# Patient Record
Sex: Female | Born: 1980
Health system: Southern US, Community
[De-identification: ages and names within clinical notes are randomized; demographics above are authoritative.]

## PROBLEM LIST (undated history)

## (undated) DIAGNOSIS — E119 Type 2 diabetes mellitus without complications: Secondary | ICD-10-CM

## (undated) HISTORY — PX: TUBAL LIGATION: SHX77

---

## 2001-11-29 ENCOUNTER — Inpatient Hospital Stay: Admission: AD | Admit: 2001-11-29 | Discharge: 2001-11-29 | Payer: Self-pay | Admitting: *Deleted

## 2001-12-04 ENCOUNTER — Encounter: Admission: RE | Admit: 2001-12-04 | Discharge: 2001-12-04 | Payer: Self-pay | Admitting: *Deleted

## 2001-12-06 ENCOUNTER — Encounter: Admission: RE | Admit: 2001-12-06 | Discharge: 2001-12-24 | Payer: Self-pay | Admitting: *Deleted

## 2001-12-11 ENCOUNTER — Inpatient Hospital Stay (HOSPITAL_COMMUNITY): Admission: AD | Admit: 2001-12-11 | Discharge: 2001-12-11 | Payer: Self-pay | Admitting: *Deleted

## 2001-12-11 ENCOUNTER — Encounter: Admission: RE | Admit: 2001-12-11 | Discharge: 2001-12-11 | Payer: Self-pay | Admitting: *Deleted

## 2001-12-13 ENCOUNTER — Inpatient Hospital Stay (HOSPITAL_COMMUNITY): Admission: AD | Admit: 2001-12-13 | Discharge: 2001-12-13 | Payer: Self-pay | Admitting: *Deleted

## 2001-12-17 ENCOUNTER — Ambulatory Visit (HOSPITAL_COMMUNITY): Admission: RE | Admit: 2001-12-17 | Discharge: 2001-12-17 | Payer: Self-pay | Admitting: *Deleted

## 2001-12-18 ENCOUNTER — Encounter: Admission: RE | Admit: 2001-12-18 | Discharge: 2001-12-18 | Payer: Self-pay | Admitting: *Deleted

## 2001-12-25 ENCOUNTER — Encounter: Admission: RE | Admit: 2001-12-25 | Discharge: 2001-12-25 | Payer: Self-pay | Admitting: *Deleted

## 2002-01-01 ENCOUNTER — Encounter: Admission: RE | Admit: 2002-01-01 | Discharge: 2002-01-01 | Payer: Self-pay | Admitting: *Deleted

## 2002-01-01 ENCOUNTER — Encounter (HOSPITAL_COMMUNITY): Admission: RE | Admit: 2002-01-01 | Discharge: 2002-01-31 | Payer: Self-pay | Admitting: *Deleted

## 2002-01-08 ENCOUNTER — Encounter: Admission: RE | Admit: 2002-01-08 | Discharge: 2002-01-08 | Payer: Self-pay | Admitting: *Deleted

## 2002-01-15 ENCOUNTER — Encounter: Admission: RE | Admit: 2002-01-15 | Discharge: 2002-01-15 | Payer: Self-pay | Admitting: *Deleted

## 2002-01-15 ENCOUNTER — Observation Stay (HOSPITAL_COMMUNITY): Admission: AD | Admit: 2002-01-15 | Discharge: 2002-01-16 | Payer: Self-pay | Admitting: Obstetrics and Gynecology

## 2002-01-22 ENCOUNTER — Encounter: Admission: RE | Admit: 2002-01-22 | Discharge: 2002-01-22 | Payer: Self-pay | Admitting: *Deleted

## 2002-01-24 ENCOUNTER — Encounter: Payer: Self-pay | Admitting: *Deleted

## 2002-01-29 ENCOUNTER — Encounter: Admission: RE | Admit: 2002-01-29 | Discharge: 2002-01-29 | Payer: Self-pay | Admitting: *Deleted

## 2002-02-04 ENCOUNTER — Encounter (HOSPITAL_COMMUNITY): Admission: RE | Admit: 2002-02-04 | Discharge: 2002-02-08 | Payer: Self-pay | Admitting: *Deleted

## 2002-02-07 ENCOUNTER — Inpatient Hospital Stay (HOSPITAL_COMMUNITY): Admission: AD | Admit: 2002-02-07 | Discharge: 2002-02-11 | Payer: Self-pay | Admitting: *Deleted

## 2002-02-07 ENCOUNTER — Encounter (INDEPENDENT_AMBULATORY_CARE_PROVIDER_SITE_OTHER): Payer: Self-pay

## 2003-04-14 ENCOUNTER — Encounter: Admission: RE | Admit: 2003-04-14 | Discharge: 2003-04-14 | Payer: Self-pay | Admitting: Internal Medicine

## 2003-04-29 ENCOUNTER — Encounter: Admission: RE | Admit: 2003-04-29 | Discharge: 2003-04-29 | Payer: Self-pay | Admitting: Internal Medicine

## 2003-05-01 ENCOUNTER — Encounter: Admission: RE | Admit: 2003-05-01 | Discharge: 2003-07-30 | Payer: Self-pay | Admitting: *Deleted

## 2003-05-15 ENCOUNTER — Encounter: Admission: RE | Admit: 2003-05-15 | Discharge: 2003-05-15 | Payer: Self-pay | Admitting: Internal Medicine

## 2003-08-07 ENCOUNTER — Encounter: Admission: RE | Admit: 2003-08-07 | Discharge: 2003-08-07 | Payer: Self-pay | Admitting: Internal Medicine

## 2003-08-21 ENCOUNTER — Encounter: Admission: RE | Admit: 2003-08-21 | Discharge: 2003-08-21 | Payer: Self-pay | Admitting: Internal Medicine

## 2003-08-28 ENCOUNTER — Encounter: Admission: RE | Admit: 2003-08-28 | Discharge: 2003-11-26 | Payer: Self-pay | Admitting: *Deleted

## 2004-08-08 ENCOUNTER — Ambulatory Visit: Payer: Self-pay | Admitting: Internal Medicine

## 2004-09-29 ENCOUNTER — Ambulatory Visit: Payer: Self-pay | Admitting: Internal Medicine

## 2004-10-12 ENCOUNTER — Ambulatory Visit: Payer: Self-pay | Admitting: Internal Medicine

## 2004-11-09 ENCOUNTER — Ambulatory Visit: Payer: Self-pay | Admitting: Internal Medicine

## 2005-12-21 ENCOUNTER — Ambulatory Visit: Payer: Self-pay | Admitting: Internal Medicine

## 2005-12-21 ENCOUNTER — Inpatient Hospital Stay (HOSPITAL_COMMUNITY): Admission: EM | Admit: 2005-12-21 | Discharge: 2005-12-23 | Payer: Self-pay | Admitting: *Deleted

## 2006-01-01 ENCOUNTER — Ambulatory Visit: Payer: Self-pay | Admitting: Internal Medicine

## 2006-01-17 ENCOUNTER — Ambulatory Visit: Payer: Self-pay | Admitting: Internal Medicine

## 2006-08-07 DIAGNOSIS — E1165 Type 2 diabetes mellitus with hyperglycemia: Secondary | ICD-10-CM | POA: Insufficient documentation

## 2006-08-07 DIAGNOSIS — E119 Type 2 diabetes mellitus without complications: Secondary | ICD-10-CM | POA: Insufficient documentation

## 2007-11-26 ENCOUNTER — Encounter: Payer: Self-pay | Admitting: Internal Medicine

## 2007-11-26 ENCOUNTER — Ambulatory Visit: Payer: Self-pay | Admitting: Hospitalist

## 2007-11-26 LAB — CONVERTED CEMR LAB: Hgb A1c MFr Bld: >14 %

## 2007-11-28 LAB — CONVERTED CEMR LAB
ALT: 19 units/L (ref 0–35)
AST: 18 units/L (ref 0–37)
Albumin: 3.3 g/dL — ABNORMAL LOW (ref 3.5–5.2)
Calcium: 9.2 mg/dL (ref 8.4–10.5)
Creatinine, Ser: 0.64 mg/dL (ref 0.40–1.20)
Glucose, Bld: 434 mg/dL — ABNORMAL HIGH (ref 70–99)
Hemoglobin, Urine: NEGATIVE
Microalb Creat Ratio: 828.1 mg/g — ABNORMAL HIGH (ref 0.0–30.0)
Microalb, Ur: 0.53 mg/dL (ref 0.00–1.89)
Potassium: 4.7 meq/L (ref 3.5–5.3)
Total Bilirubin: 0.7 mg/dL (ref 0.3–1.2)
Total CHOL/HDL Ratio: 3.7
Total Protein: 7.2 g/dL (ref 6.0–8.3)
Triglycerides: 194 mg/dL — ABNORMAL HIGH (ref <150)

## 2007-12-10 ENCOUNTER — Ambulatory Visit: Payer: Self-pay | Admitting: Internal Medicine

## 2007-12-10 ENCOUNTER — Encounter: Payer: Self-pay | Admitting: Internal Medicine

## 2007-12-10 DIAGNOSIS — E785 Hyperlipidemia, unspecified: Secondary | ICD-10-CM | POA: Insufficient documentation

## 2007-12-10 DIAGNOSIS — E1169 Type 2 diabetes mellitus with other specified complication: Secondary | ICD-10-CM | POA: Insufficient documentation

## 2007-12-11 LAB — CONVERTED CEMR LAB
Leukocytes, UA: NEGATIVE
Nitrite: POSITIVE — AB
Protein, ur: NEGATIVE mg/dL
RBC / HPF: NONE SEEN (ref <3)
TSH: 0.531 microintl units/mL (ref 0.350–5.50)
Urine Glucose: >1000 mg/dL — AB

## 2007-12-13 ENCOUNTER — Ambulatory Visit: Payer: Self-pay | Admitting: Hospitalist

## 2007-12-20 ENCOUNTER — Ambulatory Visit: Payer: Self-pay | Admitting: Internal Medicine

## 2007-12-20 LAB — CONVERTED CEMR LAB: Blood Glucose, Fingerstick: 196

## 2007-12-25 ENCOUNTER — Encounter: Payer: Self-pay | Admitting: Internal Medicine

## 2008-01-13 ENCOUNTER — Ambulatory Visit: Payer: Self-pay | Admitting: Hospitalist

## 2008-01-13 LAB — CONVERTED CEMR LAB: Blood Glucose, Fingerstick: 221

## 2008-03-10 ENCOUNTER — Ambulatory Visit: Payer: Self-pay | Admitting: Internal Medicine

## 2008-03-10 ENCOUNTER — Encounter: Payer: Self-pay | Admitting: Internal Medicine

## 2008-03-10 LAB — CONVERTED CEMR LAB
Beta hcg, urine, semiquantitative: NEGATIVE
Blood Glucose, Fingerstick: 139
Hgb A1c MFr Bld: 7 %

## 2008-03-12 LAB — CONVERTED CEMR LAB
BUN: 12 mg/dL (ref 6–23)
Calcium: 9.6 mg/dL (ref 8.4–10.5)
Chloride: 104 meq/L (ref 96–112)
Creatinine, Ser: 0.59 mg/dL (ref 0.40–1.20)

## 2008-04-03 ENCOUNTER — Encounter: Payer: Self-pay | Admitting: Internal Medicine

## 2008-04-03 ENCOUNTER — Ambulatory Visit: Payer: Self-pay | Admitting: Infectious Diseases

## 2008-04-03 LAB — CONVERTED CEMR LAB
AST: 11 units/L (ref 0–37)
Albumin: 4 g/dL (ref 3.5–5.2)
Alkaline Phosphatase: 31 units/L — ABNORMAL LOW (ref 39–117)
Potassium: 4.5 meq/L (ref 3.5–5.3)
Sodium: 138 meq/L (ref 135–145)
Total Bilirubin: 0.4 mg/dL (ref 0.3–1.2)
Total Protein: 7 g/dL (ref 6.0–8.3)

## 2008-12-01 ENCOUNTER — Ambulatory Visit: Payer: Self-pay | Admitting: *Deleted

## 2008-12-01 ENCOUNTER — Encounter: Payer: Self-pay | Admitting: Internal Medicine

## 2008-12-01 LAB — CONVERTED CEMR LAB
BUN: 15 mg/dL (ref 6–23)
CO2: 22 meq/L (ref 19–32)
Calcium: 10 mg/dL (ref 8.4–10.5)
Cholesterol: 217 mg/dL — ABNORMAL HIGH (ref 0–200)
Glucose, Bld: 336 mg/dL — ABNORMAL HIGH (ref 70–99)
Total CHOL/HDL Ratio: 3.3
VLDL: 32 mg/dL (ref 0–40)

## 2008-12-17 ENCOUNTER — Telehealth: Payer: Self-pay | Admitting: Internal Medicine

## 2008-12-22 ENCOUNTER — Encounter: Payer: Self-pay | Admitting: Internal Medicine

## 2008-12-24 ENCOUNTER — Ambulatory Visit: Payer: Self-pay | Admitting: Internal Medicine

## 2008-12-24 ENCOUNTER — Encounter: Payer: Self-pay | Admitting: Internal Medicine

## 2008-12-24 LAB — CONVERTED CEMR LAB: Preg, Serum: POSITIVE

## 2008-12-28 ENCOUNTER — Encounter (INDEPENDENT_AMBULATORY_CARE_PROVIDER_SITE_OTHER): Payer: Self-pay | Admitting: Internal Medicine

## 2008-12-28 ENCOUNTER — Ambulatory Visit: Payer: Self-pay | Admitting: Internal Medicine

## 2008-12-28 DIAGNOSIS — O0001 Abdominal pregnancy with intrauterine pregnancy: Secondary | ICD-10-CM

## 2008-12-28 LAB — CONVERTED CEMR LAB
ALT: 15 units/L (ref 0–35)
AST: 15 units/L (ref 0–37)
Basophils Absolute: 0.1 10*3/uL (ref 0.0–0.1)
Basophils Relative: 1 % (ref 0–1)
Blood Glucose, Fingerstick: 258
CO2: 24 meq/L (ref 19–32)
Chloride: 104 meq/L (ref 96–112)
Creatinine, Ser: 0.49 mg/dL (ref 0.40–1.20)
Eosinophils Absolute: 1.3 10*3/uL — ABNORMAL HIGH (ref 0.0–0.7)
Eosinophils Relative: 13 % — ABNORMAL HIGH (ref 0–5)
GFR calc Af Amer: >60 mL/min (ref >60)
Hemoglobin: 13.9 g/dL (ref 12.0–15.0)
MCHC: 32.3 g/dL (ref 30.0–36.0)
MCV: 87.1 fL (ref 78.0–100.0)
Monocytes Absolute: 0.5 10*3/uL (ref 0.1–1.0)
Monocytes Relative: 5 % (ref 3–12)
RBC: 4.93 M/uL (ref 3.87–5.11)
RDW: 12.2 % (ref 11.5–15.5)
Sodium: 135 meq/L (ref 135–145)
Total Bilirubin: 0.7 mg/dL (ref 0.3–1.2)
Total Protein: 6.5 g/dL (ref 6.0–8.3)

## 2009-01-06 ENCOUNTER — Encounter: Payer: Self-pay | Admitting: Physician Assistant

## 2009-01-06 ENCOUNTER — Ambulatory Visit: Payer: Self-pay | Admitting: Obstetrics & Gynecology

## 2009-01-06 LAB — CONVERTED CEMR LAB
HCT: 40.3 % (ref 36.0–46.0)
Hemoglobin: 13.3 g/dL (ref 12.0–15.0)
Hepatitis B Surface Ag: NEGATIVE
Hgb A2 Quant: 2.8 % (ref 2.2–3.2)
Hgb F Quant: 0 % (ref 0.0–2.0)
Hgb S Quant: 0 % (ref 0.0–0.0)
Lymphs Abs: 2.5 10*3/uL (ref 0.7–4.0)
MCV: 85.9 fL (ref 78.0–100.0)
Monocytes Relative: 5 % (ref 3–12)
Neutro Abs: 6.5 10*3/uL (ref 1.7–7.7)
Neutrophils Relative %: 63 % (ref 43–77)
Rubella: 2 intl units/mL
hCG, Beta Chain, Quant, S: 78458.9 milliintl units/mL

## 2009-01-11 ENCOUNTER — Encounter: Admission: RE | Admit: 2009-01-11 | Discharge: 2009-04-11 | Payer: Self-pay | Admitting: Obstetrics & Gynecology

## 2009-01-11 ENCOUNTER — Ambulatory Visit: Payer: Self-pay | Admitting: Obstetrics & Gynecology

## 2009-01-13 ENCOUNTER — Ambulatory Visit: Payer: Self-pay | Admitting: Internal Medicine

## 2009-01-13 ENCOUNTER — Encounter: Payer: Self-pay | Admitting: Internal Medicine

## 2009-01-18 ENCOUNTER — Ambulatory Visit: Payer: Self-pay | Admitting: Family Medicine

## 2009-01-25 ENCOUNTER — Ambulatory Visit: Payer: Self-pay | Admitting: Obstetrics & Gynecology

## 2009-01-27 ENCOUNTER — Ambulatory Visit: Payer: Self-pay | Admitting: Family Medicine

## 2009-01-27 LAB — CONVERTED CEMR LAB
AST: 12 units/L (ref 0–37)
Albumin: 3.7 g/dL (ref 3.5–5.2)
Alkaline Phosphatase: 56 units/L (ref 39–117)
BUN: 9 mg/dL (ref 6–23)
Creatinine 24 HR UR: 1065 mg/24hr (ref 700–1800)
Creatinine Clearance: 142 mL/min — ABNORMAL HIGH (ref 75–115)
Creatinine, Urine: 41.8 mg/dL
Potassium: 4.2 meq/L (ref 3.5–5.3)
Protein, Ur: 51 mg/24hr (ref 50–100)
Total Bilirubin: 0.3 mg/dL (ref 0.3–1.2)

## 2009-02-01 ENCOUNTER — Ambulatory Visit: Payer: Self-pay | Admitting: Obstetrics & Gynecology

## 2009-02-03 ENCOUNTER — Ambulatory Visit (HOSPITAL_COMMUNITY): Admission: RE | Admit: 2009-02-03 | Discharge: 2009-02-03 | Payer: Self-pay | Admitting: Obstetrics & Gynecology

## 2009-02-08 ENCOUNTER — Ambulatory Visit: Payer: Self-pay | Admitting: Obstetrics & Gynecology

## 2009-02-22 ENCOUNTER — Ambulatory Visit: Payer: Self-pay | Admitting: Obstetrics & Gynecology

## 2009-03-01 ENCOUNTER — Ambulatory Visit: Payer: Self-pay | Admitting: Obstetrics & Gynecology

## 2009-03-08 ENCOUNTER — Ambulatory Visit: Payer: Self-pay | Admitting: Obstetrics & Gynecology

## 2009-03-15 ENCOUNTER — Ambulatory Visit (HOSPITAL_COMMUNITY): Admission: RE | Admit: 2009-03-15 | Discharge: 2009-03-15 | Payer: Self-pay | Admitting: Obstetrics & Gynecology

## 2009-03-15 ENCOUNTER — Ambulatory Visit: Payer: Self-pay | Admitting: Obstetrics & Gynecology

## 2009-03-18 ENCOUNTER — Emergency Department (HOSPITAL_COMMUNITY): Admission: EM | Admit: 2009-03-18 | Discharge: 2009-03-18 | Payer: Self-pay | Admitting: Emergency Medicine

## 2009-03-22 ENCOUNTER — Ambulatory Visit: Payer: Self-pay | Admitting: Obstetrics & Gynecology

## 2009-03-22 ENCOUNTER — Inpatient Hospital Stay (HOSPITAL_COMMUNITY): Admission: AD | Admit: 2009-03-22 | Discharge: 2009-03-22 | Payer: Self-pay | Admitting: Obstetrics & Gynecology

## 2009-03-29 ENCOUNTER — Ambulatory Visit: Payer: Self-pay | Admitting: Obstetrics & Gynecology

## 2009-04-05 ENCOUNTER — Encounter: Payer: Self-pay | Admitting: Physician Assistant

## 2009-04-05 ENCOUNTER — Ambulatory Visit: Payer: Self-pay | Admitting: Obstetrics & Gynecology

## 2009-04-05 LAB — CONVERTED CEMR LAB
MCV: 87.1 fL (ref 78.0–100.0)
Platelets: 339 10*3/uL (ref 150–400)
RBC: 4.17 M/uL (ref 3.87–5.11)
WBC: 13.1 10*3/uL — ABNORMAL HIGH (ref 4.0–10.5)

## 2009-04-12 ENCOUNTER — Encounter: Admission: RE | Admit: 2009-04-12 | Discharge: 2009-07-11 | Payer: Self-pay | Admitting: Obstetrics & Gynecology

## 2009-04-12 ENCOUNTER — Ambulatory Visit: Payer: Self-pay | Admitting: Obstetrics & Gynecology

## 2009-04-26 ENCOUNTER — Ambulatory Visit: Payer: Self-pay | Admitting: Obstetrics & Gynecology

## 2009-04-26 ENCOUNTER — Encounter: Payer: Self-pay | Admitting: Physician Assistant

## 2009-05-03 ENCOUNTER — Ambulatory Visit: Payer: Self-pay | Admitting: Family Medicine

## 2009-05-10 ENCOUNTER — Ambulatory Visit: Payer: Self-pay | Admitting: Obstetrics & Gynecology

## 2009-05-10 ENCOUNTER — Ambulatory Visit (HOSPITAL_COMMUNITY): Admission: RE | Admit: 2009-05-10 | Discharge: 2009-05-10 | Payer: Self-pay | Admitting: Obstetrics & Gynecology

## 2009-05-31 ENCOUNTER — Ambulatory Visit: Payer: Self-pay | Admitting: Obstetrics & Gynecology

## 2009-05-31 ENCOUNTER — Encounter: Payer: Self-pay | Admitting: Physician Assistant

## 2009-05-31 LAB — CONVERTED CEMR LAB
HCT: 33.5 % — ABNORMAL LOW (ref 36.0–46.0)
Hemoglobin: 10.7 g/dL — ABNORMAL LOW (ref 12.0–15.0)
MCV: 87.9 fL (ref 78.0–100.0)
RBC: 3.81 M/uL — ABNORMAL LOW (ref 3.87–5.11)
WBC: 10.8 10*3/uL — ABNORMAL HIGH (ref 4.0–10.5)

## 2009-06-07 ENCOUNTER — Encounter: Payer: Self-pay | Admitting: Physician Assistant

## 2009-06-07 ENCOUNTER — Ambulatory Visit: Payer: Self-pay | Admitting: Obstetrics & Gynecology

## 2009-06-07 LAB — CONVERTED CEMR LAB: Trich, Wet Prep: NONE SEEN

## 2009-06-14 ENCOUNTER — Ambulatory Visit (HOSPITAL_COMMUNITY): Admission: RE | Admit: 2009-06-14 | Discharge: 2009-06-14 | Payer: Self-pay | Admitting: Obstetrics & Gynecology

## 2009-06-14 ENCOUNTER — Ambulatory Visit: Payer: Self-pay | Admitting: Obstetrics & Gynecology

## 2009-06-14 ENCOUNTER — Encounter: Payer: Self-pay | Admitting: Family

## 2009-06-21 ENCOUNTER — Ambulatory Visit: Payer: Self-pay | Admitting: Obstetrics & Gynecology

## 2009-06-21 ENCOUNTER — Ambulatory Visit (HOSPITAL_COMMUNITY): Admission: RE | Admit: 2009-06-21 | Discharge: 2009-06-21 | Payer: Self-pay | Admitting: Family Medicine

## 2009-06-28 ENCOUNTER — Ambulatory Visit: Payer: Self-pay | Admitting: Obstetrics & Gynecology

## 2009-07-05 ENCOUNTER — Ambulatory Visit: Payer: Self-pay | Admitting: Obstetrics & Gynecology

## 2009-07-06 ENCOUNTER — Encounter: Payer: Self-pay | Admitting: Advanced Practice Midwife

## 2009-07-08 ENCOUNTER — Encounter: Payer: Self-pay | Admitting: Family Medicine

## 2009-07-08 ENCOUNTER — Ambulatory Visit: Payer: Self-pay | Admitting: Obstetrics & Gynecology

## 2009-07-08 ENCOUNTER — Ambulatory Visit (HOSPITAL_COMMUNITY): Admission: RE | Admit: 2009-07-08 | Discharge: 2009-07-08 | Payer: Self-pay | Admitting: Family Medicine

## 2009-07-12 ENCOUNTER — Ambulatory Visit: Payer: Self-pay | Admitting: Obstetrics & Gynecology

## 2009-07-12 ENCOUNTER — Encounter: Admission: RE | Admit: 2009-07-12 | Discharge: 2009-07-12 | Payer: Self-pay | Admitting: Obstetrics & Gynecology

## 2009-07-19 ENCOUNTER — Ambulatory Visit: Payer: Self-pay | Admitting: Family Medicine

## 2009-07-19 ENCOUNTER — Encounter: Payer: Self-pay | Admitting: Obstetrics and Gynecology

## 2009-07-19 LAB — CONVERTED CEMR LAB
Chlamydia, DNA Probe: NEGATIVE
GC Probe Amp, Genital: NEGATIVE

## 2009-07-20 ENCOUNTER — Encounter: Payer: Self-pay | Admitting: Obstetrics and Gynecology

## 2009-07-22 ENCOUNTER — Ambulatory Visit: Payer: Self-pay | Admitting: Obstetrics & Gynecology

## 2009-07-26 ENCOUNTER — Ambulatory Visit: Payer: Self-pay | Admitting: Obstetrics & Gynecology

## 2009-07-29 ENCOUNTER — Ambulatory Visit: Payer: Self-pay | Admitting: Obstetrics & Gynecology

## 2009-08-02 ENCOUNTER — Encounter: Payer: Self-pay | Admitting: Family

## 2009-08-02 ENCOUNTER — Ambulatory Visit: Payer: Self-pay | Admitting: Obstetrics & Gynecology

## 2009-08-05 ENCOUNTER — Ambulatory Visit: Payer: Self-pay | Admitting: Obstetrics & Gynecology

## 2009-08-09 ENCOUNTER — Inpatient Hospital Stay (HOSPITAL_COMMUNITY): Admission: RE | Admit: 2009-08-09 | Discharge: 2009-08-12 | Payer: Self-pay | Admitting: Obstetrics & Gynecology

## 2009-08-09 ENCOUNTER — Ambulatory Visit: Payer: Self-pay | Admitting: Obstetrics & Gynecology

## 2010-10-09 ENCOUNTER — Encounter: Payer: Self-pay | Admitting: *Deleted

## 2010-12-21 LAB — POCT URINALYSIS DIP (DEVICE)
Bilirubin Urine: NEGATIVE
Glucose, UA: NEGATIVE mg/dL
Hgb urine dipstick: NEGATIVE
Hgb urine dipstick: NEGATIVE
Hgb urine dipstick: NEGATIVE
Ketones, ur: NEGATIVE mg/dL
Nitrite: NEGATIVE
Protein, ur: NEGATIVE mg/dL
Protein, ur: NEGATIVE mg/dL
Specific Gravity, Urine: 1.01 (ref 1.005–1.030)
Specific Gravity, Urine: 1.01 (ref 1.005–1.030)
Specific Gravity, Urine: 1.01 (ref 1.005–1.030)
Urobilinogen, UA: 0.2 mg/dL (ref 0.0–1.0)
Urobilinogen, UA: 0.2 mg/dL (ref 0.0–1.0)
pH: 6 (ref 5.0–8.0)
pH: 6.5 (ref 5.0–8.0)
pH: 6.5 (ref 5.0–8.0)

## 2010-12-21 LAB — GLUCOSE, CAPILLARY
Glucose-Capillary: 126 mg/dL — ABNORMAL HIGH (ref 70–99)
Glucose-Capillary: 133 mg/dL — ABNORMAL HIGH (ref 70–99)
Glucose-Capillary: 134 mg/dL — ABNORMAL HIGH (ref 70–99)
Glucose-Capillary: 156 mg/dL — ABNORMAL HIGH (ref 70–99)
Glucose-Capillary: 166 mg/dL — ABNORMAL HIGH (ref 70–99)
Glucose-Capillary: 92 mg/dL (ref 70–99)
Glucose-Capillary: 97 mg/dL (ref 70–99)

## 2010-12-21 LAB — BASIC METABOLIC PANEL
CO2: 23 mEq/L (ref 19–32)
Calcium: 8.7 mg/dL (ref 8.4–10.5)
GFR calc Af Amer: >60 mL/min (ref >60)
Sodium: 135 mEq/L (ref 135–145)

## 2010-12-21 LAB — CBC
Hemoglobin: 12.6 g/dL (ref 12.0–15.0)
MCHC: 33.2 g/dL (ref 30.0–36.0)
MCV: 91 fL (ref 78.0–100.0)
RBC: 3.1 MIL/uL — ABNORMAL LOW (ref 3.87–5.11)
RBC: 4.25 MIL/uL (ref 3.87–5.11)
WBC: 11.2 10*3/uL — ABNORMAL HIGH (ref 4.0–10.5)
WBC: 7.9 10*3/uL (ref 4.0–10.5)

## 2010-12-21 LAB — RPR: RPR Ser Ql: NONREACTIVE

## 2010-12-21 LAB — CROSSMATCH: ABO/RH(D): O POS

## 2010-12-22 LAB — POCT URINALYSIS DIP (DEVICE)
Bilirubin Urine: NEGATIVE
Glucose, UA: NEGATIVE mg/dL
Hgb urine dipstick: NEGATIVE
Hgb urine dipstick: NEGATIVE
Hgb urine dipstick: NEGATIVE
Ketones, ur: NEGATIVE mg/dL
Ketones, ur: NEGATIVE mg/dL
Nitrite: NEGATIVE
Nitrite: NEGATIVE
Nitrite: NEGATIVE
Nitrite: NEGATIVE
Protein, ur: NEGATIVE mg/dL
Protein, ur: NEGATIVE mg/dL
Protein, ur: NEGATIVE mg/dL
Protein, ur: NEGATIVE mg/dL
Protein, ur: NEGATIVE mg/dL
Specific Gravity, Urine: 1.015 (ref 1.005–1.030)
Specific Gravity, Urine: 1.015 (ref 1.005–1.030)
Urobilinogen, UA: 0.2 mg/dL (ref 0.0–1.0)
Urobilinogen, UA: 1 mg/dL (ref 0.0–1.0)
Urobilinogen, UA: 0.2 mg/dL (ref 0.0–1.0)
Urobilinogen, UA: 0.2 mg/dL (ref 0.0–1.0)
pH: 6.5 (ref 5.0–8.0)
pH: 6.5 (ref 5.0–8.0)
pH: 6 (ref 5.0–8.0)
pH: 6 (ref 5.0–8.0)

## 2010-12-23 LAB — POCT URINALYSIS DIP (DEVICE)
Bilirubin Urine: NEGATIVE
Glucose, UA: 100 mg/dL — AB
Glucose, UA: NEGATIVE mg/dL
Hgb urine dipstick: NEGATIVE
Nitrite: NEGATIVE
Nitrite: POSITIVE — AB
Nitrite: NEGATIVE
Protein, ur: 30 mg/dL — AB
Specific Gravity, Urine: >=1.03 (ref 1.005–1.030)
Urobilinogen, UA: 0.2 mg/dL (ref 0.0–1.0)
Urobilinogen, UA: 1 mg/dL (ref 0.0–1.0)
pH: 6.5 (ref 5.0–8.0)

## 2010-12-24 LAB — POCT URINALYSIS DIP (DEVICE)
Bilirubin Urine: NEGATIVE
Glucose, UA: NEGATIVE mg/dL
Glucose, UA: NEGATIVE mg/dL
Glucose, UA: NEGATIVE mg/dL
Ketones, ur: NEGATIVE mg/dL
Nitrite: NEGATIVE
Nitrite: NEGATIVE
Protein, ur: NEGATIVE mg/dL
Urobilinogen, UA: 0.2 mg/dL (ref 0.0–1.0)
Urobilinogen, UA: 0.2 mg/dL (ref 0.0–1.0)

## 2010-12-25 LAB — URINALYSIS, ROUTINE W REFLEX MICROSCOPIC
Nitrite: NEGATIVE
Specific Gravity, Urine: 1.016 (ref 1.005–1.030)
pH: 6 (ref 5.0–8.0)

## 2010-12-25 LAB — POCT URINALYSIS DIP (DEVICE)
Bilirubin Urine: NEGATIVE
Bilirubin Urine: NEGATIVE
Glucose, UA: 100 mg/dL — AB
Glucose, UA: NEGATIVE mg/dL
Glucose, UA: NEGATIVE mg/dL
Glucose, UA: NEGATIVE mg/dL
Hgb urine dipstick: NEGATIVE
Ketones, ur: NEGATIVE mg/dL
Ketones, ur: NEGATIVE mg/dL
Nitrite: NEGATIVE
Specific Gravity, Urine: 1.025 (ref 1.005–1.030)
Specific Gravity, Urine: 1.025 (ref 1.005–1.030)
Urobilinogen, UA: 0.2 mg/dL (ref 0.0–1.0)
pH: 5.5 (ref 5.0–8.0)

## 2010-12-25 LAB — GLUCOSE, CAPILLARY
Glucose-Capillary: 164 mg/dL — ABNORMAL HIGH (ref 70–99)
Glucose-Capillary: 203 mg/dL — ABNORMAL HIGH (ref 70–99)

## 2010-12-25 LAB — BASIC METABOLIC PANEL
GFR calc non Af Amer: >60 mL/min (ref >60)
Glucose, Bld: 87 mg/dL (ref 70–99)
Potassium: 3.7 mEq/L (ref 3.5–5.1)
Sodium: 137 mEq/L (ref 135–145)

## 2010-12-25 LAB — URINE CULTURE

## 2010-12-26 LAB — POCT URINALYSIS DIP (DEVICE)
Glucose, UA: NEGATIVE mg/dL
Hgb urine dipstick: NEGATIVE
Ketones, ur: NEGATIVE mg/dL
Nitrite: NEGATIVE
Nitrite: NEGATIVE
Protein, ur: 100 mg/dL — AB
Protein, ur: 30 mg/dL — AB
Protein, ur: 30 mg/dL — AB
Specific Gravity, Urine: 1.02 (ref 1.005–1.030)
Urobilinogen, UA: 0.2 mg/dL (ref 0.0–1.0)
Urobilinogen, UA: 0.2 mg/dL (ref 0.0–1.0)
pH: 6 (ref 5.0–8.0)

## 2010-12-27 LAB — POCT URINALYSIS DIP (DEVICE)
Bilirubin Urine: NEGATIVE
Bilirubin Urine: NEGATIVE
Glucose, UA: 500 mg/dL — AB
Glucose, UA: 500 mg/dL — AB
Ketones, ur: NEGATIVE mg/dL
Nitrite: NEGATIVE
Nitrite: NEGATIVE
Nitrite: NEGATIVE
Nitrite: NEGATIVE
Protein, ur: 30 mg/dL — AB
Protein, ur: NEGATIVE mg/dL
Protein, ur: 30 mg/dL — AB
Urobilinogen, UA: 0.2 mg/dL (ref 0.0–1.0)
Urobilinogen, UA: 0.2 mg/dL (ref 0.0–1.0)
Urobilinogen, UA: 0.2 mg/dL (ref 0.0–1.0)
Urobilinogen, UA: 0.2 mg/dL (ref 0.0–1.0)
pH: 6 (ref 5.0–8.0)

## 2010-12-28 LAB — POCT URINALYSIS DIP (DEVICE)
Bilirubin Urine: NEGATIVE
Bilirubin Urine: NEGATIVE
Ketones, ur: NEGATIVE mg/dL
Ketones, ur: NEGATIVE mg/dL
Nitrite: NEGATIVE
Nitrite: NEGATIVE
Protein, ur: NEGATIVE mg/dL

## 2010-12-28 LAB — GLUCOSE, CAPILLARY
Glucose-Capillary: 181 mg/dL — ABNORMAL HIGH (ref 70–99)
Glucose-Capillary: 186 mg/dL — ABNORMAL HIGH (ref 70–99)

## 2010-12-29 LAB — GLUCOSE, CAPILLARY

## 2011-02-03 NOTE — Op Note (Signed)
[facility]  Patient:    Shelby Brown, Shelby Brown Visit Number: [id number] MRN: [id number]          Service Type: ANT Location: [facility] Attending Physician:  Michaelle Copas Dictated by:   Conni Elliot, M.D. Proc. Date: 02/08/02 Admit Date:  02/04/2002 Discharge Date: 02/08/2002                             Operative Report  PREOPERATIVE DIAGNOSIS:       Intrauterine pregnancy at 36-5/7 weeks with insulin-dependent gestational diabetes and fetal tachycardia and repetitive moderate-to-severe variables.  POSTOPERATIVE DIAGNOSIS:      Intrauterine pregnancy at 36-5/7 weeks with insulin-dependent gestational diabetes and fetal tachycardia and repetitive moderate-to-severe variables and possible placental abruption.  OPERATION:                    Low-transverse cesarean -- emergent.  OPERATORS:                    Conni Elliot, M.D.                               Marlinda Mike, CNM  ANESTHESIA:                   Spinal.  OPERATIVE FINDINGS:           Female infant with Apgars of 8/9, cord pH of 7.19, with a PCO2 of 87, bicarb of 32, base excess of 0.  DESCRIPTION OF PROCEDURE:     After bringing the patient emergently to the operating room, receiving oxygen, the patient received a spinal anesthetic. The abdomen was prepped and draped in a sterile fashion.  A low transverse Pfannenstiel incision was made.  Incision was made through the skin and fascia.  Rectus muscle was separated in the midline, peritoneal cavity entered and bladder flap created.  Low transverse uterine incision was made.  Baby was in a vertex presentation, cord doubly clamped, and handed to the neonatologist in attendance.  Immediately upon delivery of the baby, there was an immediate significant amount of gush of bright red blood from the uterine cavity consistent with placental abruption.  Placental delivery followed immediately without any difficulty.  The uterus,  bladder flap, anterior peritoneum, fascia and skin were closed in routine fashion.  ESTIMATED BLOOD LOSS:         Less than 800 cc.  COUNTS:                       Needle and sponge count correct. Dictated by:   Conni Elliot, M.D. Attending Physician:  Michaelle Copas DD:  02/08/02 TD:  02/11/02 Job: [phone] [id number]

## 2011-02-03 NOTE — Discharge Summary (Signed)
[facility]  Patient:    Shelby Brown, Shelby Brown Visit Number: [id number] MRN: [id number]          Service Type: OBS Location: 910A [id number] [id number] Attending Physician:  Michaelle Copas Dictated by:   Vear Clock, M.D. Admit Date:  02/07/2002 Discharge Date: 02/11/2002                             Discharge Summary  DISCHARGE DIAGNOSES:          1. Status post low transverse cesarean section.                               2. Delivery of a viable female infant.                               3. Indications for cesarean section persistent                                  fetal tachycardia and repetitive moderate                                  variable decelerations.  DISCHARGE MEDICATIONS:        1. Ibuprofen 600 mg p.o. q.6-8h. p.r.n. pain.                               2. Percocet 5/325 one tablet p.o. q.4-6h. p.r.n.                                  severe pain #20 no refills.                               3. Depo-Provera one injection q.3 months.                               4. Iron sulfate 325 mg one tablet p.o. t.i.d.                                  with meals x6 weeks.                               5. Prenatal vitamins one tablet p.o. q.d. x6                                  weeks or while breast-feeding.  FOLLOWUP:                     The patient is to return to [facility] in six weeks for follow-up.  PROCEDURES:                   Primary low transverse cesarean section on May 24 with delivery of infant at 1809, Apgars 8  and 9.  HISTORY AND PHYSICAL:         A 30 year old G2, P1 at 68 and 5 admitted following a fetal testing appointment secondary to GDM.  Found to have an initial nonreassuring fetal heart rate.  Pregnancy complications:  Poor glycemic control.  PRENATAL LABORATORIES:         O+.  Antibody negative.  Rubella nonimmune. Hepatitis B surface antigen negative.  RPR negative.  GC negative.  Chlamydia negative.   GBS negative.  HOSPITAL COURSE:              The patient was admitted for IV fluid bolus and observation.  The patient did have initially few brief mild variables and isolated late decelerations.  The patient developed increased contractions and mild to moderate variables which did not resolve with O2 so patient was taken to the OR.  Routine postpartum/postoperative course.  The patient is breast-feeding.  Staples removed prior to discharge. Dictated by:   Vear Clock, M.D. Attending Physician:  Michaelle Copas DD:  04/06/02 TD:  04/11/02 Job: [id number]

## 2013-03-09 ENCOUNTER — Emergency Department (HOSPITAL_COMMUNITY)
Admission: EM | Admit: 2013-03-09 | Discharge: 2013-03-09 | Disposition: A | Discharge status: Home / Self Care | Payer: Self-pay | Attending: Emergency Medicine | Admitting: Emergency Medicine

## 2013-03-09 DIAGNOSIS — E1169 Type 2 diabetes mellitus with other specified complication: Secondary | ICD-10-CM | POA: Insufficient documentation

## 2013-03-09 DIAGNOSIS — R450 Nervousness: Secondary | ICD-10-CM | POA: Insufficient documentation

## 2013-03-09 DIAGNOSIS — F419 Anxiety disorder, unspecified: Secondary | ICD-10-CM

## 2013-03-09 DIAGNOSIS — F10220 Alcohol dependence with intoxication, uncomplicated: Secondary | ICD-10-CM

## 2013-03-09 DIAGNOSIS — R739 Hyperglycemia, unspecified: Secondary | ICD-10-CM

## 2013-03-09 DIAGNOSIS — Z3202 Encounter for pregnancy test, result negative: Secondary | ICD-10-CM | POA: Insufficient documentation

## 2013-03-09 DIAGNOSIS — F411 Generalized anxiety disorder: Secondary | ICD-10-CM | POA: Insufficient documentation

## 2013-03-09 DIAGNOSIS — F1010 Alcohol abuse, uncomplicated: Secondary | ICD-10-CM | POA: Insufficient documentation

## 2013-03-09 DIAGNOSIS — Z794 Long term (current) use of insulin: Secondary | ICD-10-CM | POA: Insufficient documentation

## 2013-03-09 LAB — RAPID URINE DRUG SCREEN, HOSP PERFORMED
Barbiturates: NOT DETECTED
Benzodiazepines: NOT DETECTED
Cocaine: NOT DETECTED

## 2013-03-09 LAB — COMPREHENSIVE METABOLIC PANEL
ALT: 11 U/L (ref 0–35)
BUN: 12 mg/dL (ref 6–23)
CO2: 18 mEq/L — ABNORMAL LOW (ref 19–32)
Calcium: 9.5 mg/dL (ref 8.4–10.5)
Creatinine, Ser: 0.59 mg/dL (ref 0.50–1.10)
GFR calc Af Amer: >90 mL/min (ref >90)
GFR calc non Af Amer: >90 mL/min (ref >90)
Glucose, Bld: 302 mg/dL — ABNORMAL HIGH (ref 70–99)
Sodium: 138 mEq/L (ref 135–145)
Total Protein: 7.8 g/dL (ref 6.0–8.3)

## 2013-03-09 LAB — CBC WITH DIFFERENTIAL/PLATELET
Eosinophils Absolute: 0.2 10*3/uL (ref 0.0–0.7)
Eosinophils Relative: 1 % (ref 0–5)
HCT: 38.4 % (ref 36.0–46.0)
Hemoglobin: 13.1 g/dL (ref 12.0–15.0)
Lymphocytes Relative: 19 % (ref 12–46)
Lymphs Abs: 2.6 10*3/uL (ref 0.7–4.0)
MCH: 28.4 pg (ref 26.0–34.0)
MCV: 83.3 fL (ref 78.0–100.0)
Monocytes Absolute: 0.7 10*3/uL (ref 0.1–1.0)
Monocytes Relative: 5 % (ref 3–12)
Platelets: 367 10*3/uL (ref 150–400)
RBC: 4.61 MIL/uL (ref 3.87–5.11)
WBC: 13.4 10*3/uL — ABNORMAL HIGH (ref 4.0–10.5)

## 2013-03-09 LAB — URINALYSIS, ROUTINE W REFLEX MICROSCOPIC
Bilirubin Urine: NEGATIVE
Glucose, UA: >1000 mg/dL — AB
Hgb urine dipstick: NEGATIVE
Specific Gravity, Urine: 1.016 (ref 1.005–1.030)
Urobilinogen, UA: 0.2 mg/dL (ref 0.0–1.0)

## 2013-03-09 LAB — URINE MICROSCOPIC-ADD ON

## 2013-03-09 LAB — ETHANOL: Alcohol, Ethyl (B): 159 mg/dL — ABNORMAL HIGH (ref 0–11)

## 2013-03-09 LAB — GLUCOSE, CAPILLARY: Glucose-Capillary: 218 mg/dL — ABNORMAL HIGH (ref 70–99)

## 2013-03-09 MED ORDER — ONDANSETRON 8 MG PO TBDP
8.0000 mg | ORAL_TABLET | Freq: Once | ORAL | Status: AC
Start: 2013-03-09 — End: 2013-03-09
  Administered 2013-03-09: 8 mg via ORAL
  Filled 2013-03-09: qty 1

## 2013-03-09 NOTE — ED Notes (Signed)
Pt in Telepsych with at this time with NT Fleet Contras

## 2013-03-09 NOTE — ED Provider Notes (Signed)
Medical screening examination/treatment/procedure(s) were performed by non-physician practitioner and as supervising physician I was immediately available for consultation/collaboration.  Toy Baker, MD 03/09/13 [phone]

## 2013-03-09 NOTE — ED Notes (Signed)
Pt arrived via EMS with a chief complaint of an anxiety attack.  The pt husband told EMS that she had made suicidal ideation.  Pt states that she doesn't wish to die but she has thought of it from time to time.  Her cousin recently died and left her husband with a large debt load and she does not wish to do the same to her husband and children.  Pt states her husband does make a lot of money and that the air condition has been turned off and this makes her anxious.

## 2013-03-09 NOTE — ED Notes (Signed)
Telepsych consult MD called and reported to this writer that pt is psych cleared to leave facility. Trandsferred telepsych MD to Dr. Silverio Lay to make his recommendation

## 2013-03-09 NOTE — ED Notes (Signed)
Pt's husband has arrived.  Upon discussion with the husband it was stated that one of the children called EMS.  The husband stated that the problem with his wife is that the air codition is off in the apartment and this caused her to have breathing difficulties which was interpreted as a panic attack. Pt. Husband states that because she is diabetic that this is the cause of her breathing situation.  Pt's husband denies any statements by the wife concerning suicidal intentions.

## 2013-03-09 NOTE — ED Provider Notes (Signed)
Medical screening examination/treatment/procedure(s) were performed by non-physician practitioner and as supervising physician I was immediately available for consultation/collaboration.    Nelia Shi, MD 03/09/13 [phone]

## 2013-03-09 NOTE — ED Provider Notes (Signed)
History     CSN: [id number]  Arrival date & time 03/09/13  0409   First MD Initiated Contact with Patient 03/09/13 0434      Chief Complaint  Patient presents with  . Medical Clearance  . Alcohol Intoxication    (Consider location/radiation/quality/duration/timing/severity/associated sxs/prior treatment) HPI Comments: Patient is here following an anxiety attack.  She states her husband lost his job.  He doesn't make enough money when he works they have is 3 children.  She also reports that her cousin recently died and left her husband with a very large debt  And she doesn't want to do this to her husband and children    she has a history of anxiety attacks.  She states she is seeing a counselor after her last anxiety attack that she was referred for anxiety: She is an insulin-dependent diabetic, whose blood sugar normally is, between 200and 300 now.  Her blood sugar is 270.  She states she has been taking her medication as instructed.  She denies suicidality, although her husband is concerned, that she expressed this on several occasions.  Patient is a 32 y.o. female presenting with intoxication. The history is provided by the patient. A language interpreter was used.  Alcohol Intoxication This is a recurrent problem. The current episode started 1 to 4 weeks ago. Pertinent negatives include no chills, fever, nausea, rash or vomiting.    No past medical history on file.  No past surgical history on file.  No family history on file.  History  Substance Use Topics  . Smoking status: Not on file  . Smokeless tobacco: Not on file  . Alcohol Use: Not on file    OB History   No data available      Review of Systems  Unable to perform ROS Constitutional: Negative for fever and chills.  Gastrointestinal: Negative for nausea and vomiting.  Skin: Negative for rash.  Psychiatric/Behavioral: The patient is nervous/anxious.   All other systems reviewed and are  negative.    Allergies  Review of patient's allergies indicates no known allergies.  Home Medications  No current outpatient prescriptions on file.  BP 101/67  Pulse 91  Temp(Src) 98.6 F (37 C) (Oral)  Resp 16  SpO2 96%  Physical Exam  Vitals reviewed. Constitutional: She is oriented to person, place, and time. She appears well-developed and well-nourished.  Eyes: Pupils are equal, round, and reactive to light.  Neck: Normal range of motion.  Cardiovascular: Normal rate.   Pulmonary/Chest: Effort normal.  Abdominal: Soft.  Musculoskeletal: Normal range of motion.  Neurological: She is alert and oriented to person, place, and time.  Skin: Skin is warm and dry.  Psychiatric: Her mood appears anxious.    ED Course  Procedures (including critical care time)  Labs Reviewed  URINALYSIS, ROUTINE W REFLEX MICROSCOPIC - Abnormal; Notable for the following:    Glucose, UA >1000 (*)    All other components within normal limits  CBC WITH DIFFERENTIAL - Abnormal; Notable for the following:    WBC 13.4 (*)    Neutro Abs 9.9 (*)    All other components within normal limits  COMPREHENSIVE METABOLIC PANEL - Abnormal; Notable for the following:    CO2 18 (*)    Glucose, Bld 302 (*)    All other components within normal limits  ETHANOL - Abnormal; Notable for the following:    Alcohol, Ethyl (B) 159 (*)    All other components within normal limits  GLUCOSE, CAPILLARY -  Abnormal; Notable for the following:    Glucose-Capillary 218 (*)    All other components within normal limits  URINE RAPID DRUG SCREEN (HOSP PERFORMED)  URINE MICROSCOPIC-ADD ON  POCT PREGNANCY, URINE   No results found.   1. Anxiety   2. Alcohol intoxication in episodic drinker, uncomplicated   3. Hyperglycemia       MDM  Spoken with ACT.  Will evaluate the patient         Arman Filter, NP 03/09/13 2046

## 2013-03-09 NOTE — ED Provider Notes (Signed)
6:30 AM Sig care of the patient from nurse practitioner Tomasa Blase. Patient presents the ED with alcohol intoxication, hypoglycemia and panic attack to 2 life stressors.  Currently awaiting act team consult.  Will repeat CBG. No anion gap or DKA  Somnolent but easily arouses no complaints CV: RRR, No M/R/G, Peripheral pulses intact. No peripheral edema. Lungs: CTAB Abd: Soft, Non tender, non distended   Patient cleared for discharge by psych.Patient is complaining of headache and nausea, likely due to hangover.patient given 8mg  zofran odt. Appears safe for discharge.  Arthor Captain, PA-C 03/09/13 1026

## 2014-01-06 ENCOUNTER — Encounter: Payer: Self-pay | Admitting: Internal Medicine

## 2014-01-06 ENCOUNTER — Ambulatory Visit (INDEPENDENT_AMBULATORY_CARE_PROVIDER_SITE_OTHER): Payer: Self-pay | Admitting: Internal Medicine

## 2014-01-06 VITALS — BP 115/79 | HR 83 | Temp 96.9°F | Ht 63.0 in | Wt 122.7 lb

## 2014-01-06 DIAGNOSIS — Z598 Other problems related to housing and economic circumstances: Secondary | ICD-10-CM | POA: Insufficient documentation

## 2014-01-06 DIAGNOSIS — E119 Type 2 diabetes mellitus without complications: Secondary | ICD-10-CM

## 2014-01-06 DIAGNOSIS — E785 Hyperlipidemia, unspecified: Secondary | ICD-10-CM

## 2014-01-06 DIAGNOSIS — Z599 Problem related to housing and economic circumstances, unspecified: Secondary | ICD-10-CM

## 2014-01-06 DIAGNOSIS — R05 Cough: Secondary | ICD-10-CM

## 2014-01-06 DIAGNOSIS — R059 Cough, unspecified: Secondary | ICD-10-CM

## 2014-01-06 LAB — COMPLETE METABOLIC PANEL WITH GFR
ALBUMIN: 4.3 g/dL (ref 3.5–5.2)
ALT: 14 U/L (ref 0–35)
AST: 18 U/L (ref 0–37)
Alkaline Phosphatase: 91 U/L (ref 39–117)
BUN: 12 mg/dL (ref 6–23)
CALCIUM: 9.8 mg/dL (ref 8.4–10.5)
CHLORIDE: 97 meq/L (ref 96–112)
CO2: 26 meq/L (ref 19–32)
Creat: 0.62 mg/dL (ref 0.50–1.10)
GLUCOSE: 429 mg/dL — AB (ref 70–99)
POTASSIUM: 4.5 meq/L (ref 3.5–5.3)
SODIUM: 132 meq/L — AB (ref 135–145)
TOTAL PROTEIN: 7.7 g/dL (ref 6.0–8.3)
Total Bilirubin: 0.4 mg/dL (ref 0.2–1.2)

## 2014-01-06 LAB — POCT GLYCOSYLATED HEMOGLOBIN (HGB A1C): Hemoglobin A1C: >14

## 2014-01-06 LAB — GLUCOSE, CAPILLARY: Glucose-Capillary: 419 mg/dL — ABNORMAL HIGH (ref 70–99)

## 2014-01-06 LAB — POCT URINE PREGNANCY: Preg Test, Ur: NEGATIVE

## 2014-01-06 MED ORDER — RELION PRIME MONITOR DEVI: Status: DC

## 2014-01-06 MED ORDER — INSULIN NPH ISOPHANE & REGULAR (70-30) 100 UNIT/ML ~~LOC~~ SUSP: 12.0000 [IU] | Freq: Two times a day (BID) | SUBCUTANEOUS | Status: DC

## 2014-01-06 MED ORDER — RELION ULTRA THIN LANCETS 30G MISC: Status: DC

## 2014-01-06 MED ORDER — "INSULIN SYRINGE-NEEDLE U-100 31G X 15/64"" 0.3 ML MISC": Status: DC

## 2014-01-06 MED ORDER — METFORMIN HCL 500 MG PO TABS: 500.0000 mg | ORAL_TABLET | Freq: Two times a day (BID) | ORAL | Status: DC

## 2014-01-06 MED ORDER — GLUCOSE BLOOD VI STRP: ORAL_STRIP | Status: DC

## 2014-01-06 MED ORDER — BENZONATATE 100 MG PO CAPS: 100.0000 mg | ORAL_CAPSULE | Freq: Four times a day (QID) | ORAL | Status: AC | PRN

## 2014-01-06 NOTE — Patient Instructions (Addendum)
Thanks for you visit. - I have ordered you a new glucose meter and testing supplies. [facility] so I have sent the prescription there. - I would like you to take insulin going forward. I have prescribed Novolin 70/30 insulin and syringes. Please inject 12 units twice a day, in the morning with breakfast and at night with dinner. This is the most affordable insulin. - I have also prescribed a medicine called metformin. Please take this twice a day. This is also for your diabetes. It [facility]4 at [facility]. - I have prescribed a medicine called Tessalon for cough. It is also [facility]4 at [facility]. - It is very important to followup closely with us while we get your diabetes under better control. - Please return to the clinic in 2 weeks with your paperwork [facility]. There are several other tests we need to do, but it will be more affordable once you have the [employer] [facility].

## 2014-01-06 NOTE — Progress Notes (Signed)
Subjective:    Patient ID: Shelby Brown, female    DOB: May 05, 1981, 33 y.o.   MRN: [id number]  HPI  Shelby Brown is a 33 y.o. G3P3 female PMH DM2, HLD who presents to the clinic to reestablish care. She has not been seen here since 2010. She is Spanish-speaking only, an interpreter is present.  DM2 - Last A1C 13.0 on 12/01/2008. She takes no medications currently. She was previously taking Novolin 70/30 mix 12 units twice a day per notes in 2010. She says she was given insulin once or twice in the ED but has not been taking any at home. She brings two empty medication bottles: Metformin 842m BID with a prescribed date of 10/03/13 and no refills, and Glimepiride 45mdaily with a prescribed date of 09/14/13 and no refills. She says these were prescribed by an ER doctor and she ran out several months ago. She reports some blurry vision for years. She has not seen an eye doctor. Denies numbness or tingling in her feet.  HLD - Last lipid panel is from 2010, TC 217, HDL 66, LDL 119. She is not taking a statin.  Lipid Panel     Component Value Date/Time   CHOL 217* 12/01/2008 2119   TRIG 160* 12/01/2008 2119   HDL 66 12/01/2008 2119   CHOLHDL 3.3 Ratio 12/01/2008 2119   VLDL 32 12/01/2008 2119   LDLCALC 119* 12/01/2008 2119    Cough - Patient endorses a productive cough x1 month. It is sometimes associated with shortness of breath and chest pain. No history of asthma. No tobacco use. No fevers, occasional chills.  Medication history - She is not taking any medications at home.   Social history - Denies tobacco, alcohol, or drug use. (Of note, she was seen in the ED in 6/14 and noted to be acutely intoxicated with alcohol.) She lives with her husband and three kids, age [id number] feels safe at home and tells me her support system is her husband and family. She does not work or go to school. She does not have insurance, I will order tests judiciously. She [facility] DeM[facility]ana [facility]  [facility]  Review of Systems  Constitutional: Negative for fever.  Eyes: Positive for visual disturbance (Blurry vision for years).  Respiratory: Positive for cough (Productive, x1 month). Negative for wheezing.   Cardiovascular: Negative for chest pain.  Gastrointestinal: Negative for nausea, abdominal pain and diarrhea.  Genitourinary: Negative for dysuria.  Neurological: Negative for weakness and numbness.      Objective:   Physical Exam  Constitutional: She is oriented to person, place, and time. She appears well-developed and well-nourished.  Young, pleasant woman. Normal weight, not obese. Spanish-speaking only.  HENT:  Head: Normocephalic and atraumatic.  Eyes: Conjunctivae and EOM are normal. Pupils are equal, round, and reactive to light.  Neck: Normal range of motion. Neck supple.  Cardiovascular: Normal rate, regular rhythm, normal heart sounds and intact distal pulses.  Exam reveals no gallop and no friction rub.   No murmur heard. Pulmonary/Chest: Effort normal and breath sounds normal. No respiratory distress. She has no wheezes. She has no rales. She exhibits no tenderness.  Abdominal: Soft. She exhibits no distension. There is no tenderness.  Musculoskeletal: Normal range of motion. She exhibits no edema and no tenderness.  Neurological: She is alert and oriented to person, place, and time. No cranial nerve deficit.  Skin: Skin is warm and dry.  Psychiatric: She has  a normal mood and affect.    Filed Vitals:   01/06/14 1508  BP: 115/79  Pulse: 83  Temp: 96.9 F (36.1 C)      Assessment & Plan:   Please see problem based charting.

## 2014-01-06 NOTE — Assessment & Plan Note (Addendum)
Patient reports productive cough x1 month. She is not coughing here. Lung exam is clear. She is afebrile. Denies subjective fevers at home. Weight is stable since 2010. - Prescribed Tessalon 100 mg every 4 hours when necessary - If cough is persistent, consider chest xray (if finances allow) as patient is an immigrant and at higher risk for tuberculosis

## 2014-01-06 NOTE — Assessment & Plan Note (Signed)
Patient has met with Marlana Latus and is in the process of applying for an [employer]. I have ordered laboratory tests judiciously today, keeping in mind that she will have to pay out of pocket for them.

## 2014-01-06 NOTE — Assessment & Plan Note (Addendum)
Last lipid panel from 2010 is below. She needs a repeat lipid panel. I did not order it this visit as cost is $42 out of pocket and I felt the CMP, A1C, and pregnancy tests were higher priority in this young woman with no insurance. - Repeat lipid panel at next visit or [facility] - Consider statin therapy based on results (unable to calculate ASCVD risk given young age)  Lipid Panel     Component Value Date/Time   CHOL 217* 12/01/2008 2119   TRIG 160* 12/01/2008 2119   HDL 66 12/01/2008 2119   CHOLHDL 3.3 Ratio 12/01/2008 2119   VLDL 32 12/01/2008 2119   LDLCALC 119* 12/01/2008 2119

## 2014-01-06 NOTE — Assessment & Plan Note (Addendum)
Lab Results  Component Value Date   HGBA1C >14.0 01/06/2014   HGBA1C 13.0 12/01/2008   HGBA1C 7.0 03/10/2008     Assessment: Diabetes control:  Poor Progress toward A1C goal:   Deteriorated Comments: This is the patient's first followup visit since 2010, she has not been taking medications or insulin on a regular basis since that time. CBG 419 here.  Plan: Medications:  Ordered 1) metformin 500 mg twice a day, this will be $4 at [facility], creatinine was normal in June 2014, and 2) Novolin 70/30 insulin 12 units twice a day, this is the regimen the patient was supposed to be taking in 2010 Home glucose monitoring: Frequency:   4 times a day Timing:  before meals and at bedtime Instruction/counseling given: reminded to bring blood glucose meter & log to each visit and reminded to bring medications to each visit Other plans: I had a ~ 20 minute talk with the patient about the consequences of her uncontrolled diabetes, including blindness and disability. She appears somewhat motivated at this time to followup in the clinic and get her diabetes under control. She tells me she knows how to use insulin. She has a meter at home, but she is not sure where it is or whether it is still working. - Ordered Rel[employer] meter and supplies (lancets, strips) and [facility] per Lupita LeashDonna this is the most affordable meter out there for self-pay patients - Needs an eye exam, would be a good candidate for the retinal camera once [employer] has been approved - Consider urine microalbumin studies next visit depending on insurance status and priority of other lab work - Followup in 2 weeks, needs to see Lupita LeashDonna at some point as well  ADDENDUM: CMP shows hyperglycemia and some associated hyponatremia. No acidosis and normal renal function. LFTs are within normal limits. Pregnancy test is negative.  CMP     Component Value Date/Time   NA 132* 01/06/2014 1522   K 4.5 01/06/2014 1522   CL 97  01/06/2014 1522   CO2 26 01/06/2014 1522   GLUCOSE 429* 01/06/2014 1522   BUN 12 01/06/2014 1522   CREATININE 0.62 01/06/2014 1522   CREATININE 0.59 03/09/2013 0510   CALCIUM 9.8 01/06/2014 1522   PROT 7.7 01/06/2014 1522   ALBUMIN 4.3 01/06/2014 1522   AST 18 01/06/2014 1522   ALT 14 01/06/2014 1522   ALKPHOS 91 01/06/2014 1522   BILITOT 0.4 01/06/2014 1522   GFRNONAA >89 01/06/2014 1522   GFRNONAA >90 03/09/2013 0510   GFRAA >89 01/06/2014 1522   GFRAA >90 03/09/2013 0510

## 2014-01-07 NOTE — Progress Notes (Signed)
INTERNAL MEDICINE TEACHING ATTENDING ADDENDUM - Dyanara Cozza, MD: I reviewed and discussed at the time of visit with the resident Dr. Cater, the patient's medical history, physical examination, diagnosis and results of tests and treatment and I agree with the patient's care as documented. 

## 2014-01-20 ENCOUNTER — Ambulatory Visit: Payer: Self-pay

## 2014-11-28 ENCOUNTER — Emergency Department (HOSPITAL_COMMUNITY): Payer: Self-pay

## 2014-11-28 ENCOUNTER — Emergency Department (HOSPITAL_COMMUNITY)
Admission: EM | Admit: 2014-11-28 | Discharge: 2014-11-28 | Disposition: A | Discharge status: Home / Self Care | Payer: Self-pay | Attending: Emergency Medicine | Admitting: Emergency Medicine

## 2014-11-28 ENCOUNTER — Encounter (HOSPITAL_COMMUNITY): Payer: Self-pay

## 2014-11-28 DIAGNOSIS — E119 Type 2 diabetes mellitus without complications: Secondary | ICD-10-CM | POA: Insufficient documentation

## 2014-11-28 DIAGNOSIS — Z3202 Encounter for pregnancy test, result negative: Secondary | ICD-10-CM | POA: Insufficient documentation

## 2014-11-28 DIAGNOSIS — R109 Unspecified abdominal pain: Secondary | ICD-10-CM | POA: Insufficient documentation

## 2014-11-28 DIAGNOSIS — H5711 Ocular pain, right eye: Secondary | ICD-10-CM | POA: Insufficient documentation

## 2014-11-28 DIAGNOSIS — Z794 Long term (current) use of insulin: Secondary | ICD-10-CM | POA: Insufficient documentation

## 2014-11-28 DIAGNOSIS — IMO0002 Reserved for concepts with insufficient information to code with codable children: Secondary | ICD-10-CM

## 2014-11-28 DIAGNOSIS — E1165 Type 2 diabetes mellitus with hyperglycemia: Secondary | ICD-10-CM

## 2014-11-28 DIAGNOSIS — IMO0001 Reserved for inherently not codable concepts without codable children: Secondary | ICD-10-CM

## 2014-11-28 DIAGNOSIS — R519 Headache, unspecified: Secondary | ICD-10-CM

## 2014-11-28 DIAGNOSIS — Z79899 Other long term (current) drug therapy: Secondary | ICD-10-CM | POA: Insufficient documentation

## 2014-11-28 DIAGNOSIS — R51 Headache: Secondary | ICD-10-CM | POA: Insufficient documentation

## 2014-11-28 HISTORY — DX: Type 2 diabetes mellitus without complications: E11.9

## 2014-11-28 LAB — URINE MICROSCOPIC-ADD ON

## 2014-11-28 LAB — CBC WITH DIFFERENTIAL/PLATELET
BASOS PCT: 0 % (ref 0–1)
Basophils Absolute: 0 10*3/uL (ref 0.0–0.1)
EOS PCT: 4 % (ref 0–5)
Eosinophils Absolute: 0.5 10*3/uL (ref 0.0–0.7)
HCT: 43.1 % (ref 36.0–46.0)
HEMOGLOBIN: 14.5 g/dL (ref 12.0–15.0)
LYMPHS ABS: 2 10*3/uL (ref 0.7–4.0)
Lymphocytes Relative: 15 % (ref 12–46)
MCH: 29 pg (ref 26.0–34.0)
MCHC: 33.6 g/dL (ref 30.0–36.0)
MCV: 86.2 fL (ref 78.0–100.0)
MONO ABS: 0.6 10*3/uL (ref 0.1–1.0)
MONOS PCT: 4 % (ref 3–12)
NEUTROS ABS: 9.7 10*3/uL — AB (ref 1.7–7.7)
Neutrophils Relative %: 77 % (ref 43–77)
Platelets: 330 10*3/uL (ref 150–400)
RBC: 5 MIL/uL (ref 3.87–5.11)
RDW: 12.5 % (ref 11.5–15.5)
WBC: 12.7 10*3/uL — ABNORMAL HIGH (ref 4.0–10.5)

## 2014-11-28 LAB — COMPREHENSIVE METABOLIC PANEL
ALT: 13 U/L (ref 0–35)
AST: 17 U/L (ref 0–37)
Albumin: 3.7 g/dL (ref 3.5–5.2)
Alkaline Phosphatase: 62 U/L (ref 39–117)
Anion gap: 11 (ref 5–15)
BILIRUBIN TOTAL: 0.9 mg/dL (ref 0.3–1.2)
BUN: 18 mg/dL (ref 6–23)
CALCIUM: 9.3 mg/dL (ref 8.4–10.5)
CO2: 21 mmol/L (ref 19–32)
CREATININE: 0.68 mg/dL (ref 0.50–1.10)
Chloride: 103 mmol/L (ref 96–112)
GFR calc non Af Amer: >90 mL/min (ref >90)
Glucose, Bld: 225 mg/dL — ABNORMAL HIGH (ref 70–99)
Potassium: 4.8 mmol/L (ref 3.5–5.1)
Sodium: 135 mmol/L (ref 135–145)
Total Protein: 7.6 g/dL (ref 6.0–8.3)

## 2014-11-28 LAB — URINALYSIS, ROUTINE W REFLEX MICROSCOPIC
Glucose, UA: >1000 mg/dL — AB
Hgb urine dipstick: NEGATIVE
Leukocytes, UA: NEGATIVE
Nitrite: NEGATIVE
Protein, ur: 30 mg/dL — AB
SPECIFIC GRAVITY, URINE: 1.035 — AB (ref 1.005–1.030)
UROBILINOGEN UA: 0.2 mg/dL (ref 0.0–1.0)
pH: 5.5 (ref 5.0–8.0)

## 2014-11-28 LAB — CBG MONITORING, ED: GLUCOSE-CAPILLARY: 186 mg/dL — AB (ref 70–99)

## 2014-11-28 LAB — POC URINE PREG, ED: PREG TEST UR: NEGATIVE

## 2014-11-28 LAB — LIPASE, BLOOD: LIPASE: 20 U/L (ref 11–59)

## 2014-11-28 MED ORDER — METOCLOPRAMIDE HCL 5 MG/ML IJ SOLN
10.0000 mg | Freq: Once | INTRAMUSCULAR | Status: AC
  Administered 2014-11-28: 10 mg via INTRAVENOUS
  Filled 2014-11-28: qty 2

## 2014-11-28 MED ORDER — METFORMIN HCL 500 MG PO TABS: 500.0000 mg | ORAL_TABLET | Freq: Two times a day (BID) | ORAL | Status: DC

## 2014-11-28 MED ORDER — FLUORESCEIN SODIUM 1 MG OP STRP
1.0000 | ORAL_STRIP | Freq: Once | OPHTHALMIC | Status: AC
  Administered 2014-11-28: 1 via OPHTHALMIC
  Filled 2014-11-28: qty 1

## 2014-11-28 MED ORDER — KETOROLAC TROMETHAMINE 30 MG/ML IJ SOLN
30.0000 mg | Freq: Once | INTRAMUSCULAR | Status: AC
  Administered 2014-11-28: 30 mg via INTRAVENOUS
  Filled 2014-11-28: qty 1

## 2014-11-28 MED ORDER — TETRACAINE HCL 0.5 % OP SOLN
1.0000 [drp] | Freq: Once | OPHTHALMIC | Status: AC
  Administered 2014-11-28: 1 [drp] via OPHTHALMIC
  Filled 2014-11-28: qty 2

## 2014-11-28 MED ORDER — DIPHENHYDRAMINE HCL 50 MG/ML IJ SOLN
25.0000 mg | Freq: Once | INTRAMUSCULAR | Status: AC
  Administered 2014-11-28: 25 mg via INTRAVENOUS
  Filled 2014-11-28: qty 1

## 2014-11-28 MED ORDER — SODIUM CHLORIDE 0.9 % IV BOLUS (SEPSIS)
1000.0000 mL | Freq: Once | INTRAVENOUS | Status: AC
  Administered 2014-11-28: 1000 mL via INTRAVENOUS

## 2014-11-28 NOTE — Discharge Instructions (Signed)
Dolor abdominal en las mujeres (Abdominal Pain, Women) El dolor abdominal (en el estmago, la pelvis o el vientre) puede tener muchas causas. Es importante que le informe a su mdico:  La ubicacin del Engineer, miningdolor.  Viene y se va, o persiste todo el tiempo?  Hay situaciones que Location managerinician el dolor (comer ciertos alimentos, la actividad fsica)?  Tiene otros sntomas asociados al dolor (fiebre, nuseas, vmitos, diarrea)? Todo es de gran ayuda cuando se trata de hallar la causa del dolor. CAUSAS  Estmago: Infecciones por virus o bacterias, o lcera.  Intestino: Apendicitis (apndice inflamado), ileitis regional (enfermedad de Crohn), colitis ulcerosa (colon inflamado), sndrome del colon irritable, diverticulitis (inflamacin de los divertculos del colon) o cncer de estmago oo intestino.  Enfermedades de la vescula biliar o clculos.  Enfermedades renales, clculos o infecciones en el rin.  Infeccin o cncer del pncreas.  Fibromialgia (trastorno doloroso)  Enfermedades de los rganos femeninos:  Uterus: tero: fibroma (tumor no canceroso) o infeccin  Trompas de Falopio: infeccin o embarazo ectpico  En los ovarios, quistes o tumores.  Adherencias plvicas (tejido cicatrizal).  Endometriosis (el tejido que cubre el tero se desarrolla en la pelvis y los rganos plvicos).  Sndrome de Agricultural engineercongestin plvica (los rganos femeninos se llenan de sangre antes del periodo menstrual(  Dolor durante el periodo menstrual.  Dolor durante la ovulacin (al producir vulos).  Dolor al usar el DIU (dispositivo intrauterino para el control de la natalidad)  Psychologist, clinicalCncer en los rganos femeninos.  Dolor funcional (no est originado en una enfermedad, puede mejorar sin tratamiento).  Dolor de origen psicolgico  Depresin. DIAGNSTICO Su mdico decidir la gravedad del dolor a travs del examen fsico  Anlisis de sangre  Radiografas  Ecografas  TC (tomografa computada,  tipo especial de radiografas).  IMR (resonancia magntica)  Cultivos, en el caso una infeccin  Colon por enema de bario (se inserta una sustancia de contraste en el intestino grueso para mejorar la observacin con rayos X.)  Colonoscopa (observacin del intestino con un tubo luminoso).  Laparoscopa (examen del interior del abdomen con un tubo que tiene Intel Corporationuna luz).  Ciruga exploratoria abdominal mayor (se observa el abdomen realizando una gran incisin). TRATAMIENTO El tratamiento depender de la causa del problema.   Muchos de estos casos pueden controlarse y tratarse en casa.  Medicamentos de venta libre indicados por el mdico.  Medicamentos con receta.  Antibiticos, en caso de infeccin  Pldoras anticonceptivas, en el caso de perodos dolorosos o dolor al ovular.  Tratamiento hormonal, para la endometriosis  Inyecciones para bloqueo nervioso selectivo.  Fisioterapia.  Antidepresivos.  Consejos por parte de un psclogo o psiquiatra.  Ciruga mayor o menor. INSTRUCCIONES PARA EL CUIDADO DOMICILIARIO  No tome ni administre laxantes a menos que se lo haya indicado su mdico.  Tome analgsicos de venta libre slo si se lo ha indicado el profesional que lo asiste. No tome aspirina, ya que puede causar Apple Computermolestias en el estmago o hemorragias.  Consuma una dieta lquida (caldo o agua) segn lo indicado por el mdico. Progrese lentamente a una dieta blanda, segn la tolerancia, si el dolor se relaciona con el estmago o el intestino.  Tenga un termmetro y tmese la temperatura varias veces al da.  Haga reposo en la cama y [place]erma, si esto Research scientist (life sciences)alivia el dolor.  Evite las relaciones sexuales, Counsellorsi le producen dolor.  Evite las situaciones estresantes.  Cumpla con las visitas y los anlisis de control, segn las indicaciones de su mdico.  Si el dolor  no se [employer] o la [place], [place] tratar con:  Acupuntura.  Ejercicios de relajacin (yoga,  meditacin).  Terapia grupal.  Psicoterapia. SOLICITE ATENCIN MDICA SI:  Nota que ciertos Pharmacist, community de [place].  El tratamiento indicado para Arboriculturist no Marketing executive.  Necesita analgsicos ms fuertes.  Quiere que le retiren el DIU.  Si se siente confundido o desfalleciente.  Presenta nuseas o vmitos.  Aparece una erupcin cutnea.  Sufre efectos adversos o una reaccin alrgica debido a los medicamentos que toma. SOLICITE ATENCIN MDICA DE INMEDIATO SI:  El dolor persiste o se agrava.  Tiene fiebre.  Siente el dolor slo en algunos sectores del abdomen. Si se localiza en la zona derecha, posiblemente podra tratarse de apendicitis. En un adulto, si se localiza en la regin inferior izquierda del abdomen, podra tratarse de colitis o diverticulitis.  Hay sangre en las heces (deposiciones de color rojo brillante o negro alquitranado), con o sin vmitos.  Usted presenta sangre en la orina.  Siente escalofros con o sin fiebre.  Se desmaya. ASEGRESE QUE:   Comprende estas instrucciones.  Controlar su enfermedad.  Solicitar ayuda de inmediato si no mejora o si empeora. Document Released: 12/21/2008 Document Revised: 11/27/2011 [facility] Patient Information 2015 [place], [place]. This information is not intended to replace advice given to you by your health care provider. Make sure you discuss any questions you have with your health care provider. Dolor de cabeza general sin causa  (General Headache Without Cause)   EL dolor de cabeza es un dolor o malestar que se siente en la zona de la cabeza o del cuello. Puede no tener una causa especfica. Hay muchas causas y tipos de dolores de [place]. Los ms comunes son:   Cefalea tensional.  Cefaleas migraosas.  Cefalea en brotes.  Cefaleas diarias crnicas. INSTRUCCIONES PARA EL CUIDADO EN EL HOGAR   Cumpla con todas las citas programadas con su mdico o con el especialista  al que lo hayan derivado.  Slo tome medicamentos de venta libre o recetados para Primary school teacher o Environmental health practitioner, segn las indicaciones de su mdico.  Cuando sienta dolor de cabeza acustese en un cuarto oscuro y tranquilo.  Lleve un registro diario para Financial risk analyst lo que Press photographer. Por ejemplo, escriba:  Lo que come y bebe.  Cunto tiempo duerme.  Todo cambio en la dieta o medicamentos.  Intente con masajes u otras tcnicas de relajacin.  Colquese compresas de hielo o calor en la cabeza y en el cuello. selos 3 a 4 veces por da de 15 a 20 minutos por vez, o como sea necesario.  Limite las situaciones de estrs.  Sintese con la espalda recta y no  tense los msculos.  Si fuma, deje de hacerlo.  Limite el consumo de bebidas alcohlicas.  Consuma menos cantidad de cafena o deje de tomarla.  Coma y duerma en horarios regulares.  Duerma entre 7 y 9 horas o como lo indique su mdico.  Dietitian las luces tenues si le molestan las luces brillantes y Hospital doctor dolor de [place]. SOLICITE ATENCIN MDICA SI:   Tiene problemas con los Arboriculturist.  Los medicamentos no Education officer, environmental.  El dolor de cabeza que senta habitualmente es diferente.  Tiene nuseas o vmitos. SOLICITE ATENCIN MDICA DE INMEDIATO SI:   El dolor se hace cada vez ms intenso.  Tiene fiebre.  Presenta rigidez en el cuello.  Sufre prdida de la  visin.  Presenta debilidad muscular o prdida del control muscular.  Comienza a perder el equilibrio o tiene problemas para caminar.  Sufre mareos o se desmaya.  Tiene sntomas graves que son diferentes a los primeros sntomas. ASEGRESE DE QUE:   Comprende estas instrucciones.  Controlar su enfermedad.  Solicitar ayuda de inmediato si no mejora o empeora. Document Released: 06/14/2005 Document Revised: 03/05/2012 [facility] Patient Information 2015 [place], [place]. This information is not intended to replace advice  given to you by your health care provider. Make sure you discuss any questions you have with your health care provider.

## 2014-11-28 NOTE — ED Notes (Signed)
Pt. Left with all belongings and refused wheelchair 

## 2014-11-28 NOTE — ED Notes (Signed)
Checked CBG 186 RN informed

## 2014-11-28 NOTE — ED Notes (Signed)
Onset 3 days right eye pain, redness, watering.  No injuries noted.  No other s/s noted.

## 2014-11-28 NOTE — ED Provider Notes (Signed)
MSE was initiated and I personally evaluated the patient and placed orders (if any) at  5:14 PM on November 28, 2014.    Shelby Brown is a 34 y.o. female, with PMH noted below, who presents to the Emergency Department complaining of right eye pain with associated redness, watering and photophobia onset 3 days ago. Pt describes the pain as a constant stabbing pain and rates it an 8 out of 10. Pt also complains of associated dizziness from the pain. Pt reports using clear eyes eye drops at home with no relief. Also using ampicillin. Has been out of her metformin for a month. Has consensual eye pain and will not open either eye. States she can't see when the lights are on. Reports that she had this before in the past and she "almost died from brain injury". On exam, unaffected eye's pupil is constricted and hurts with shining light. Pt will not allow examination of R (affected) eye. Also has LLQ and suprapubic TTP, complains of pain x3 days. At this point, [facility] Proceeded with labs, urine, and CT head given her c/o severe HA and eye pain with a concerning history of possible brain issue.   The patient appears stable so that the remainder of the MSE may be completed by another provider.  Pixie Burgener Camprubi-Soms, PA-C 11/28/14 1718  Jerelyn ScottMartha Linker, MD 11/28/14 [phone]

## 2014-11-28 NOTE — ED Provider Notes (Signed)
CSN: [id number]     Arrival date & time 11/28/14  1553 History   First MD Initiated Contact with Patient 11/28/14 1656     Chief Complaint  Patient presents with  . Abdominal Pain  . Eye Pain  . Dizziness     (Consider location/radiation/quality/duration/timing/severity/associated sxs/prior Treatment) HPI Comments: Patient presents emergency department with chief complaint of headache. She states that the headache started about 3 days ago. She also reports right eye pain and pain behind her right eye. She states that she has had symptoms similar to this in the past. She has somewhat frequent headaches. She states that the pain is worsened with light. She states that feels like someone is stabbing her in the eye. She denies any trauma to the eye. Denies any foreign body. She denies any fevers, chills, weakness, numbness, or tingling. She has not taken anything to alleviate her symptoms. Patient also complains of vague abdominal pain which is intermittent in nature. Currently, she does not have any abdominal pain.  The history is provided by the patient. The history is limited by a language barrier. A language interpreter was used.    Past Medical History  Diagnosis Date  . Diabetes mellitus without complication    History reviewed. No pertinent past surgical history. History reviewed. No pertinent family history. History  Substance Use Topics  . Smoking status: Never Smoker   . Smokeless tobacco: Not on file  . Alcohol Use: No   OB History    No data available     Review of Systems  Constitutional: Negative for fever and chills.  Respiratory: Negative for shortness of breath.   Cardiovascular: Negative for chest pain.  Gastrointestinal: Negative for nausea, vomiting, diarrhea and constipation.  Genitourinary: Negative for dysuria.  Neurological: Positive for headaches.  All other systems reviewed and are negative.     Allergies  Review of patient's allergies indicates no  known allergies.  Home Medications   Prior to Admission medications   Medication Sig Start Date End Date Taking? Authorizing Provider  benzonatate (TESSALON PERLES) 100 MG capsule Take 1 capsule (100 mg total) by mouth every 6 (six) hours as needed for cough. 01/06/14 01/06/15  Lacie Scotts, MD  Blood Glucose Monitoring Suppl (RELION PRIME MONITOR) DEVI Please use device to check blood sugars four (4) times per day, before meals and at bedtime. 01/06/14   Lacie Scotts, MD  glucose blood test strip Use to check blood sugar four (4) times per day, before meals and at bedtime 01/06/14   Lacie Scotts, MD  insulin NPH-regular Human (NOVOLIN 70/30) (70-30) 100 UNIT/ML injection Inject 12 Units into the skin 2 (two) times daily with a meal. 01/06/14   Lacie Scotts, MD  Insulin Syringe-Needle U-100 31G X 15/64" 0.3 ML MISC Use twice daily to administer insulin 01/06/14   Lacie Scotts, MD  metFORMIN (GLUCOPHAGE) 500 MG tablet Take 1 tablet (500 mg total) by mouth 2 (two) times daily with a meal. 01/06/14 01/06/15  Lacie Scotts, MD  RELION ULTRA THIN LANCETS 30G MISC Use to check blood sugars four (4) times per day, before meals and at bedtime 01/06/14   Lacie Scotts, MD   BP 97/66 mmHg  Pulse 91  Temp(Src) 98.7 F (37.1 C) (Tympanic)  Resp 16  Wt 118 lb 8 oz (53.751 kg)  SpO2 99%  LMP 11/05/2014 Physical Exam  Constitutional: She is oriented to person, place, and time. She appears well-developed and well-nourished.  HENT:  Head: Normocephalic and atraumatic.  Right Ear: External ear normal.  Left Ear: External ear normal.  Eyes: Conjunctivae and EOM are normal. Pupils are equal, round, and reactive to light.  No fluorescein uptake, no no evidence of foreign body  Neck: Normal range of motion. Neck supple.  No pain with neck flexion, no meningismus  Cardiovascular: Normal rate, regular rhythm and normal heart sounds.  Exam reveals no gallop and no friction rub.   No murmur  heard. Pulmonary/Chest: Effort normal and breath sounds normal. No respiratory distress. She has no wheezes. She has no rales. She exhibits no tenderness.  Abdominal: Soft. She exhibits no distension and no mass. There is no tenderness. There is no rebound and no guarding.  Musculoskeletal: Normal range of motion. She exhibits no edema or tenderness.  Normal gait.  Neurological: She is alert and oriented to person, place, and time. She has normal reflexes.  CN 3-12 intact, normal finger to nose, no pronator drift, sensation and strength intact bilaterally.  Skin: Skin is warm and dry.  Psychiatric: She has a normal mood and affect. Her behavior is normal. Judgment and thought content normal.  Nursing note and vitals reviewed.   ED Course  Procedures (including critical care time) Labs Review Labs Reviewed  CBC WITH DIFFERENTIAL/PLATELET - Abnormal; Notable for the following:    WBC 12.7 (*)    Neutro Abs 9.7 (*)    All other components within normal limits  COMPREHENSIVE METABOLIC PANEL - Abnormal; Notable for the following:    Glucose, Bld 225 (*)    All other components within normal limits  URINALYSIS, ROUTINE W REFLEX MICROSCOPIC - Abnormal; Notable for the following:    Color, Urine AMBER (*)    Specific Gravity, Urine 1.035 (*)    Glucose, UA >1000 (*)    Bilirubin Urine SMALL (*)    Ketones, ur >80 (*)    Protein, ur 30 (*)    All other components within normal limits  URINE MICROSCOPIC-ADD ON - Abnormal; Notable for the following:    Squamous Epithelial / LPF MANY (*)    All other components within normal limits  CBG MONITORING, ED - Abnormal; Notable for the following:    Glucose-Capillary 186 (*)    All other components within normal limits  LIPASE, BLOOD  POC URINE PREG, ED    Imaging Review Ct Head Wo Contrast  11/28/2014   CLINICAL DATA:  Three day history of right eye pain and erythema. Associated headache.  EXAM: CT HEAD WITHOUT CONTRAST  TECHNIQUE:  Contiguous axial images were obtained from the base of the skull through the vertex without intravenous contrast.  COMPARISON:  None.  FINDINGS: Ventricular system normal in size and appearance for age. No mass lesion. No midline shift. No acute hemorrhage or hematoma. No extra-axial fluid collections. No evidence of acute infarction. No focal brain parenchymal abnormalities.  No focal osseous abnormalities involving the skull. Visualized paranasal sinuses, bilateral mastoid air cells, and bilateral middle ear cavities well-aerated. Visualized orbits and globes intact.  IMPRESSION: Normal examination.   Electronically Signed   By: Hulan Saas M.D.   On: 11/28/2014 18:32     EKG Interpretation None      MDM   Final diagnoses:  Headache  Abdominal pain, unspecified abdominal location  Diabetes type 2, uncontrolled  Diabetes mellitus type 2, uncontrolled, without complications    Patient with headache, initially seen in fast track, but was moved to the acute side for further workup  given that the patient also complains of some intermittent abdominal pain. Check basic labs. CT scan is negative the patient's head. Will treat with headache cocktail. Will reassess. Labs are fairly reassuring. She does have a mild leukocytosis, and appears to be a bit dehydrated.  She does have some ketones in her urine, but does not have an elevated anion. Highly doubt DKA. Eye exam is unremarkable. Will treat symptoms and will reassess.  9:05 PM   Patient discussed with Dr. Loretha StaplerWofford, who agrees with plan for discharge.  Repeat abdominal exam is unremarkable.  Pt HA treated and improved while in ED.  Presentation is like pts typical HA and non concerning for [facility]AH, ICH, Meningitis, or temporal arteritis. Pt is afebrile with no focal neuro deficits, nuchal rigidity, or change in vision. Pt is to follow up with PCP to discuss prophylactic medication. Pt verbalizes understanding and is agreeable with plan to dc.       Roxy Horsemanobert Tjuana Vickrey, PA-C 11/28/14 2106  Blake DivineJohn Wofford, MD 11/29/14 [phone]

## 2015-01-29 ENCOUNTER — Encounter: Payer: Self-pay | Admitting: *Deleted

## 2017-08-06 ENCOUNTER — Ambulatory Visit: Payer: Self-pay | Admitting: Physician Assistant

## 2017-08-06 ENCOUNTER — Encounter: Payer: Self-pay | Admitting: Physician Assistant

## 2017-08-06 ENCOUNTER — Other Ambulatory Visit: Payer: Self-pay

## 2017-08-06 VITALS — BP 110/64 | HR 96 | Temp 98.1°F | Resp 16 | Ht 63.0 in | Wt 114.4 lb

## 2017-08-06 DIAGNOSIS — Z598 Other problems related to housing and economic circumstances: Secondary | ICD-10-CM

## 2017-08-06 DIAGNOSIS — R200 Anesthesia of skin: Secondary | ICD-10-CM

## 2017-08-06 DIAGNOSIS — Z599 Problem related to housing and economic circumstances, unspecified: Secondary | ICD-10-CM

## 2017-08-06 DIAGNOSIS — E118 Type 2 diabetes mellitus with unspecified complications: Secondary | ICD-10-CM

## 2017-08-06 DIAGNOSIS — Z794 Long term (current) use of insulin: Secondary | ICD-10-CM

## 2017-08-06 DIAGNOSIS — IMO0001 Reserved for inherently not codable concepts without codable children: Secondary | ICD-10-CM

## 2017-08-06 DIAGNOSIS — E1165 Type 2 diabetes mellitus with hyperglycemia: Secondary | ICD-10-CM

## 2017-08-06 LAB — CMP14+EGFR
ALT: 8 IU/L (ref 0–32)
AST: 12 IU/L (ref 0–40)
Albumin/Globulin Ratio: 1.6 (ref 1.2–2.2)
Albumin: 4.5 g/dL (ref 3.5–5.5)
Alkaline Phosphatase: 67 IU/L (ref 39–117)
BUN/Creatinine Ratio: 16 (ref 9–23)
BUN: 13 mg/dL (ref 6–20)
Bilirubin Total: 0.6 mg/dL (ref 0.0–1.2)
CO2: 20 mmol/L (ref 20–29)
Calcium: 9.5 mg/dL (ref 8.7–10.2)
Chloride: 104 mmol/L (ref 96–106)
Creatinine, Ser: 0.8 mg/dL (ref 0.57–1.00)
GFR calc Af Amer: 109 mL/min/{1.73_m2} (ref >59)
GFR calc non Af Amer: 94 mL/min/{1.73_m2} (ref >59)
Globulin, Total: 2.9 g/dL (ref 1.5–4.5)
Glucose: 81 mg/dL (ref 65–99)
Potassium: 4.3 mmol/L (ref 3.5–5.2)
Sodium: 139 mmol/L (ref 134–144)
Total Protein: 7.4 g/dL (ref 6.0–8.5)

## 2017-08-06 LAB — POCT URINALYSIS DIP (MANUAL ENTRY)
Glucose, UA: 500 mg/dL — AB
Leukocytes, UA: NEGATIVE
Nitrite, UA: NEGATIVE
Protein Ur, POC: NEGATIVE mg/dL
Spec Grav, UA: 1.02 (ref 1.010–1.025)
Urobilinogen, UA: 0.2 U/dL
pH, UA: 5.5 (ref 5.0–8.0)

## 2017-08-06 LAB — POCT GLYCOSYLATED HEMOGLOBIN (HGB A1C): Hemoglobin A1C: 13

## 2017-08-06 LAB — GLUCOSE, POCT (MANUAL RESULT ENTRY): POC Glucose: 284 mg/dL — AB (ref 70–99)

## 2017-08-06 MED ORDER — METFORMIN HCL 500 MG PO TABS: 500.0000 mg | ORAL_TABLET | Freq: Two times a day (BID) | ORAL | 3 refills | Status: DC

## 2017-08-06 MED ORDER — GLIMEPIRIDE 2 MG PO TABS: 2.0000 mg | ORAL_TABLET | Freq: Every day | ORAL | 3 refills | Status: DC

## 2017-08-06 NOTE — Progress Notes (Signed)
Shelby Brown  MRN: [id number] DOB: 09-15-1981  PCP: Patient, No Pcp Per  Subjective:  Pt is a 36 year old female who presents to clinic for diabetes check. She speaks spanish. She is here today with her daughter who is interpreting.  She was receiving care at the clinic next door - they are now appointment only.  DM - She is a poor historian. Her last dose of medication was a few months ago. Glimepride (usure of dose) and Metformin 533m twice daily. No insulin. She does not check home blood sugars. Does not eat DM-friendly diet.  Cannot recall last A1C value.   Face - left sided-numbness. Denies difficulty with speech or understanding, inability to smile, one-sided weakness, drooping of face, difficulty eating. Feels "tingling"   Review of Systems  Eyes: Negative for visual disturbance.  Endocrine: Negative for polydipsia, polyphagia and polyuria.  Skin: Negative.   Neurological: Positive for numbness (left face). Negative for dizziness, facial asymmetry, speech difficulty, weakness, light-headedness and headaches.    Patient Active Problem List   Diagnosis Date Noted  . Financial difficulties 01/06/2014  . Cough 01/06/2014  . DYSLIPIDEMIA 12/10/2007  . Diabetes type 2, uncontrolled ([place] 08/07/2006    Current Outpatient Medications on File Prior to Visit  Medication Sig Dispense Refill  . Blood Glucose Monitoring Suppl (RELION PRIME MONITOR) DEVI Please use device to check blood sugars four (4) times per day, before meals and at bedtime. (Patient not taking: Reported on 08/06/2017) 1 Device 0  . glucose blood test strip Use to check blood sugar four (4) times per day, before meals and at bedtime (Patient not taking: Reported on 08/06/2017) 100 each 12  . insulin NPH-regular Human (NOVOLIN 70/30) (70-30) 100 UNIT/ML injection Inject 12 Units into the skin 2 (two) times daily with a meal. (Patient not taking: Reported on 08/06/2017) 10 mL 1  . Insulin Syringe-Needle U-100  31G X 15/64" 0.3 ML MISC Use twice daily to administer insulin (Patient not taking: Reported on 08/06/2017) 100 each 1  . metFORMIN (GLUCOPHAGE) 500 MG tablet Take 1 tablet (500 mg total) by mouth 2 (two) times daily with a meal. 60 tablet 0  . RELION ULTRA THIN LANCETS 30G MISC Use to check blood sugars four (4) times per day, before meals and at bedtime 100 each 1   No current facility-administered medications on file prior to visit.     No Known Allergies   Objective:  BP 110/64   Pulse 96   Temp 98.1 F (36.7 C) (Oral)   Resp 16   Ht _0  (1.6 m)   Wt 114 lb 6.4 oz (51.9 kg)   LMP 07/22/2017   SpO2 100%   BMI 20.27 kg/m   Diabetic Foot Exam - Simple   Simple Foot Form Diabetic Foot exam was performed with the following findings:  Yes 08/06/2017 10:00 AM  Visual Inspection No deformities, no ulcerations, no other skin breakdown bilaterally:  Yes Sensation Testing Intact to touch and monofilament testing bilaterally:  Yes Pulse Check Comments Could not feel pedal pulses    Lab Results  Component Value Date   HGBA1C 13.0 08/06/2017    Physical Exam  Constitutional: She is oriented to person, place, and time and well-developed, well-nourished, and in no distress. No distress.  HENT:  Mouth/Throat: Oropharynx is clear and moist and mucous membranes are normal. No dental abscesses.  Cardiovascular: Normal rate, regular rhythm and normal heart sounds.  Neurological: She is alert and oriented to person,  place, and time. She has normal motor skills, normal sensation, normal strength and intact cranial nerves. She displays no weakness. GCS score is 15.  Skin: Skin is warm and dry.  Psychiatric: Mood, memory, affect and judgment normal.  Vitals reviewed.  Results for orders placed or performed in visit on 08/06/17  POCT glucose (manual entry)  Result Value Ref Range   POC Glucose 284 (A) 70 - 99 mg/dl  POCT urinalysis dipstick  Result Value Ref Range   Color, UA  yellow yellow   Clarity, UA clear clear   Glucose, UA =500 (A) negative mg/dL   Bilirubin, UA small (A) negative   Ketones, POC UA >= (160) (A) negative mg/dL   Spec Grav, UA 1.020 1.010 - 1.025   Blood, UA small (A) negative   pH, UA 5.5 5.0 - 8.0   Protein Ur, POC negative negative mg/dL   Urobilinogen, UA 0.2 0.2 or 1.0 E.U./dL   Nitrite, UA Negative Negative   Leukocytes, UA Negative Negative  POCT glycosylated hemoglobin (Hb A1C)  Result Value Ref Range   Hemoglobin A1C 13.0     Assessment and Plan :  1. Diabetes mellitus type 2, uncontrolled, without complications (HCC) - HM Diabetes Foot Exam - POCT glucose (manual entry) - POCT urinalysis dipstick - POCT glycosylated hemoglobin (Hb A1C) - Microalbumin, urine - CMP14+EGFR - glimepiride (AMARYL) 2 MG tablet; Take 1 tablet (2 mg total) daily before breakfast by mouth.  Dispense: 30 tablet; Refill: 3 - metFORMIN (GLUCOPHAGE) 500 MG tablet; Take 1 tablet (500 mg total) 2 (two) times daily with a meal for 1 dose by mouth. Take with food. Week 1: take 1/2 tab twice a day. Week 2: take 1 tab in the morning, 1/2 tab at night. Week 3: take 2 tabs twice a day.  Dispense: 60 tablet; Refill: 3 - Pt presents for DM check, is a new patient here. She is a poor historian and is unsure of medication doses, previous A1C's and does not check home sugars. She has not been taking medication x several months due to no refills. Today her A1C is 13. Will start Metformin and Amaryl. Titration schedule discussed. RTC in 4-6 weeks for recheck. She is self pay and is concerned about having lots of copays. Discussed with pt need to get blood sugar under control, then we can space out visits. She verbalizes understanding.  2. Facial numbness - No sign of stroke or Bell's Palsy. Unsure of etiology, possibly elevated sugars.  3. Financial difficulties  Mercer Pod, PA-C  [facility] 08/06/2017 10:20 AM

## 2017-08-06 NOTE — Patient Instructions (Addendum)
Very high blood sugar will damage your kidneys, eyes, heart and much more. Today your hemoglobin A1C is 13. (Hemoglobin A1C is a blood test that shows what your average blood sugar level has been for the past 2 to 3 months.) We need to get this <8.  Why do my A1C numbers matter?-Studies show that keeping A1C numbers close to normal helps keep people from getting: ?Diabetic retinopathy, an eye disease that can cause blindness ?Nerve damage caused by diabetes (also called neuropathy) ?Kidney disease   Metformin Dosing (to be taken with food) Week 1: take 1/2 tablet twice a day. Week 2: take 1 tablet in the morning, 1/2 tablet at night. Week 3: take 2 tablets twice a day.  Glimepiride: Take 2 mg once daily with breakfast (or the first main meal). After two weeks, take '4mg'$  once daily with breakfast.   Come back and see me in 4-6 weeks. Once your blood sugar is controlled, you will not have to come in very often. Diabetes.org is a great resource to help you learn about diabetes and how food affects your blood sugar.   Thank you for coming in today. I hope you feel we met your needs.  Feel free to call PCP if you have any questions or further requests.  Please consider signing up for MyChart if you do not already have it, as this is a great way to communicate with me.  Best,  [employer], PA-C  La diabetes mellitus y los alimentos (Diabetes Mellitus and Food) Es importante que controle su nivel de azcar en la sangre (glucosa). El nivel de glucosa en sangre depende en gran medida de lo que usted come. Comer alimentos saludables en las cantidades [place] a lo largo del Training and development officer, aproximadamente a la misma hora [employer], lo ayudar a Chief Technology Officer su nivel de Multimedia programmer. Tambin puede ayudarlo a retrasar o Patent attorney de la diabetes mellitus. Comer de [employer] saludable incluso puede ayudarlo a Chartered loss adjuster de presin arterial y a Science writer o Theatre manager un peso  saludable. Entre las recomendaciones generales para alimentarse y Audiological scientist los alimentos de forma saludable, se incluyen las siguientes:  Respetar las comidas principales y comer colaciones con regularidad. Evitar pasar largos perodos sin comer con el fin de perder peso.  Seguir una dieta que consista principalmente en alimentos de origen vegetal, como frutas, vegetales, frutos secos, legumbres y cereales integrales.  Utilizar mtodos de coccin a baja temperatura, como hornear, en lugar de mtodos de coccin a alta temperatura, como frer en abundante aceite. Trabaje con el nutricionista para aprender a Financial planner nutricional de las etiquetas de los alimentos. CMO PUEDEN AFECTARME LOS ALIMENTOS? Carbohidratos Los carbohidratos afectan el nivel de glucosa en sangre ms que cualquier otro tipo de alimento. El nutricionista lo ayudar a Teacher, adult education cuntos carbohidratos puede consumir en cada comida y ensearle a contarlos. El recuento de carbohidratos es importante para mantener la glucosa en sangre en un nivel saludable, en especial si utiliza insulina o toma determinados medicamentos para la diabetes mellitus. Alcohol El alcohol puede provocar disminuciones sbitas de la glucosa en sangre (hipoglucemia), en especial si utiliza insulina o toma determinados medicamentos para la diabetes mellitus. La hipoglucemia es una afeccin que puede poner en peligro la vida. Los sntomas de la hipoglucemia (somnolencia, mareos y Data processing manager) son similares a los sntomas de haber consumido mucho alcohol. Si el mdico lo autoriza a beber alcohol, hgalo con moderacin y siga estas pautas:  Las mujeres no deben  beber ms de un trago por da, y los hombres no deben beber ms de dos tragos por Training and development officer. Un trago es igual a: ? 12 onzas (355 ml) de cerveza ? 5 onzas de vino (150 ml) de vino ? 1,5onzas (20m) de bebidas espirituosas  No beba con el estmago vaco.  Mantngase hidratado. Beba agua,  gaseosas dietticas o t helado sin azcar.  Las gaseosas comunes, los jugos y otros refrescos podran contener muchos carbohidratos y se dCivil Service fast streamer QU ALIMENTOS NO SE RECOMIENDAN? Cuando haga las elecciones de alimentos, es importante que recuerde que todos los alimentos son distintos. Algunos tienen menos nutrientes que otros por porcin, aunque podran tener la misma cantidad de caloras o carbohidratos. Es difcil darle al cuerpo lo que necesita cuando consume alimentos con menos nutrientes. Estos son algunos ejemplos de alimentos que debera evitar ya que contienen muchas caloras y carbohidratos, pero pocos nutrientes:  GPhysicist, medicaltrans (la mayora de los alimentos procesados incluyen grasas trans en la etiqueta de Informacin nutricional).  Gaseosas comunes.  Jugos.  Caramelos.  Dulces, como tortas, pasteles, rosquillas y gEminence  Comidas fritas. QU ALIMENTOS PUEDO COMER? Consuma alimentos ricos en nutrientes, que nutrirn el cuerpo y lo mantendrn saludable. Los alimentos que debe comer tambin dependern de varios factores, como:  Las caloras que necesita.  Los medicamentos que toma.  Su peso.  El nivel de glucosa en s[place]  El nCape Mayde presin arterial.  El nivel de colesterol. Debe consumir una amplia variedad de alimentos, por ejemplo:  Protenas. ? Cortes de c[employer]uc[employer] Corporation ? Protenas con bajo contenido de grasas saturadas, como pescado, clara de huevo y frijoles. Evite las carnes procesadas.  Frutas y vegetales. ? FCote d'Ivoirey vPhotographerque pueden ayudar a cIllinois Tool Worksniveles sanguneos de gLeoti como m[place] mangos y batatas.  Productos lcteos. ? Elija productos lcteos sin grasa o con bajo contenido de gSnelling como lHoliday City South yogur y qPlandome [place]  Cereales, panes, pastas y arroz. ? Elija cereales integrales, como panes multicereales, avena en grano y arroz integral. Estos alimentos pueden ayudar a controlar la presin arterial.  GDaphene Jaeger ? Alimentos que  contengan grasas saludables, como frutos secos, aMusician aceite de oD[place] aceite de canola y pescado. TODOS LOS QUE PADECEN DIABETES MELLITUS TIENEN EL MRockinghamPLAN DE [place] Dado que todas las personas que padecen diabetes mellitus son distintas, no hay un solo plan de comidas que funcione para todos. Es muy importante que se rena con un nutricionista que lo ayudar a crear un plan de comidas adecuado para usted. Esta informacin no tiene cMarine scientistel consejo del mdico. Asegrese de hacerle al mdico cualquier pregunta que tenga. Document Released: 12/12/2007 Document Revised: 09/25/2014 Document Reviewed: 08/01/2013 Elsevier Interactive Patient Education  2017 [employer]Reyn[employer]   IF you received an x-ray today, you will receive an invoice from [facility]. Please contact [facility] at [phone] questions or concerns regarding your invoice.   IF you received labwork today, you will receive an invoice from [place] Please contact [facility] at [phone] questions or concerns regarding your invoice.   Our billing staff will not be able to assist you with questions regarding bills from these companies.  You will be contacted with the lab results as soon as they are available. The fastest way to get your results is to activate your My Chart account. Instructions are located on the last page of this paperwork. If you have not heard from uKorearegarding the results in 2 weeks, please contact this office.

## 2017-08-07 ENCOUNTER — Encounter: Payer: Self-pay | Admitting: Physician Assistant

## 2017-08-07 LAB — MICROALBUMIN, URINE: Microalbumin, Urine: 44.5 ug/mL

## 2017-10-27 ENCOUNTER — Other Ambulatory Visit: Payer: Self-pay

## 2017-10-27 ENCOUNTER — Encounter (HOSPITAL_COMMUNITY): Payer: Self-pay | Admitting: Emergency Medicine

## 2017-10-27 ENCOUNTER — Inpatient Hospital Stay (HOSPITAL_COMMUNITY)
Admission: EM | Admit: 2017-10-27 | Discharge: 2017-11-01 | DRG: 918 | Disposition: A | Discharge status: Other Acute Inpatient Hospital | Payer: Self-pay | Attending: Internal Medicine | Admitting: Internal Medicine

## 2017-10-27 DIAGNOSIS — E872 Acidosis, unspecified: Secondary | ICD-10-CM

## 2017-10-27 DIAGNOSIS — T383X4A Poisoning by insulin and oral hypoglycemic [antidiabetic] drugs, undetermined, initial encounter: Secondary | ICD-10-CM

## 2017-10-27 DIAGNOSIS — E1165 Type 2 diabetes mellitus with hyperglycemia: Secondary | ICD-10-CM | POA: Diagnosis present

## 2017-10-27 DIAGNOSIS — T1491XA Suicide attempt, initial encounter: Secondary | ICD-10-CM

## 2017-10-27 DIAGNOSIS — T383X2A Poisoning by insulin and oral hypoglycemic [antidiabetic] drugs, intentional self-harm, initial encounter: Principal | ICD-10-CM | POA: Diagnosis present

## 2017-10-27 DIAGNOSIS — Z23 Encounter for immunization: Secondary | ICD-10-CM

## 2017-10-27 DIAGNOSIS — K5900 Constipation, unspecified: Secondary | ICD-10-CM | POA: Diagnosis present

## 2017-10-27 DIAGNOSIS — Z833 Family history of diabetes mellitus: Secondary | ICD-10-CM

## 2017-10-27 DIAGNOSIS — Z915 Personal history of self-harm: Secondary | ICD-10-CM

## 2017-10-27 DIAGNOSIS — Z630 Problems in relationship with spouse or partner: Secondary | ICD-10-CM

## 2017-10-27 DIAGNOSIS — E119 Type 2 diabetes mellitus without complications: Secondary | ICD-10-CM | POA: Diagnosis present

## 2017-10-27 DIAGNOSIS — Y92003 Bedroom of unspecified non-institutional (private) residence as the place of occurrence of the external cause: Secondary | ICD-10-CM

## 2017-10-27 DIAGNOSIS — F4323 Adjustment disorder with mixed anxiety and depressed mood: Secondary | ICD-10-CM | POA: Diagnosis present

## 2017-10-27 DIAGNOSIS — IMO0002 Reserved for concepts with insufficient information to code with codable children: Secondary | ICD-10-CM | POA: Diagnosis present

## 2017-10-27 HISTORY — DX: Poisoning by insulin and oral hypoglycemic (antidiabetic) drugs, intentional self-harm, initial encounter: T38.3X2A

## 2017-10-27 HISTORY — DX: Adjustment disorder with mixed anxiety and depressed mood: F43.23

## 2017-10-27 LAB — I-STAT CG4 LACTIC ACID, ED
Lactic Acid, Venous: 2.85 mmol/L (ref 0.5–1.9)
Lactic Acid, Venous: 5.49 mmol/L (ref 0.5–1.9)

## 2017-10-27 LAB — LACTIC ACID, PLASMA
LACTIC ACID, VENOUS: 2.2 mmol/L — AB (ref 0.5–1.9)
LACTIC ACID, VENOUS: 1 mmol/L (ref 0.5–1.9)

## 2017-10-27 LAB — CBC
HEMATOCRIT: 38.4 % (ref 36.0–46.0)
HEMOGLOBIN: 12.9 g/dL (ref 12.0–15.0)
MCH: 29.4 pg (ref 26.0–34.0)
MCHC: 33.6 g/dL (ref 30.0–36.0)
MCV: 87.5 fL (ref 78.0–100.0)
Platelets: 366 10*3/uL (ref 150–400)
RBC: 4.39 MIL/uL (ref 3.87–5.11)
RDW: 13 % (ref 11.5–15.5)
WBC: 7.6 10*3/uL (ref 4.0–10.5)

## 2017-10-27 LAB — COMPREHENSIVE METABOLIC PANEL
ALBUMIN: 4 g/dL (ref 3.5–5.0)
ALK PHOS: 66 U/L (ref 38–126)
ALT: 27 U/L (ref 14–54)
ANION GAP: 11 (ref 5–15)
AST: 25 U/L (ref 15–41)
BILIRUBIN TOTAL: 0.4 mg/dL (ref 0.3–1.2)
BUN: 7 mg/dL (ref 6–20)
CO2: 24 mmol/L (ref 22–32)
Calcium: 9.3 mg/dL (ref 8.9–10.3)
Chloride: 103 mmol/L (ref 101–111)
Creatinine, Ser: 0.46 mg/dL (ref 0.44–1.00)
GFR calc Af Amer: >60 mL/min (ref >60)
GFR calc non Af Amer: >60 mL/min (ref >60)
GLUCOSE: 310 mg/dL — AB (ref 65–99)
Potassium: 3.8 mmol/L (ref 3.5–5.1)
SODIUM: 138 mmol/L (ref 135–145)
TOTAL PROTEIN: 7.8 g/dL (ref 6.5–8.1)

## 2017-10-27 LAB — BASIC METABOLIC PANEL
ANION GAP: 10 (ref 5–15)
Anion gap: 4 — ABNORMAL LOW (ref 5–15)
Anion gap: 3 — ABNORMAL LOW (ref 5–15)
BUN: 5 mg/dL — ABNORMAL LOW (ref 6–20)
BUN: 5 mg/dL — AB (ref 6–20)
BUN: 7 mg/dL (ref 6–20)
CALCIUM: 8.3 mg/dL — AB (ref 8.9–10.3)
CHLORIDE: 107 mmol/L (ref 101–111)
CHLORIDE: 108 mmol/L (ref 101–111)
CO2: 21 mmol/L — AB (ref 22–32)
CO2: 26 mmol/L (ref 22–32)
CO2: 25 mmol/L (ref 22–32)
CREATININE: 0.47 mg/dL (ref 0.44–1.00)
CREATININE: 0.48 mg/dL (ref 0.44–1.00)
CREATININE: 0.52 mg/dL (ref 0.44–1.00)
Calcium: 8.2 mg/dL — ABNORMAL LOW (ref 8.9–10.3)
Calcium: 8.3 mg/dL — ABNORMAL LOW (ref 8.9–10.3)
Chloride: 103 mmol/L (ref 101–111)
GFR calc Af Amer: >60 mL/min (ref >60)
GFR calc Af Amer: >60 mL/min (ref >60)
GFR calc non Af Amer: >60 mL/min (ref >60)
Glucose, Bld: 405 mg/dL — ABNORMAL HIGH (ref 65–99)
Glucose, Bld: 165 mg/dL — ABNORMAL HIGH (ref 65–99)
Glucose, Bld: 243 mg/dL — ABNORMAL HIGH (ref 65–99)
Potassium: 3.9 mmol/L (ref 3.5–5.1)
Potassium: 3.1 mmol/L — ABNORMAL LOW (ref 3.5–5.1)
Potassium: 3.6 mmol/L (ref 3.5–5.1)
SODIUM: 134 mmol/L — AB (ref 135–145)
SODIUM: 137 mmol/L (ref 135–145)
SODIUM: 136 mmol/L (ref 135–145)

## 2017-10-27 LAB — RAPID URINE DRUG SCREEN, HOSP PERFORMED
AMPHETAMINES: NOT DETECTED
BARBITURATES: NOT DETECTED
BENZODIAZEPINES: NOT DETECTED
COCAINE: NOT DETECTED
Opiates: NOT DETECTED
TETRAHYDROCANNABINOL: NOT DETECTED

## 2017-10-27 LAB — CBG MONITORING, ED
Glucose-Capillary: 317 mg/dL — ABNORMAL HIGH (ref 65–99)
Glucose-Capillary: 302 mg/dL — ABNORMAL HIGH (ref 65–99)

## 2017-10-27 LAB — I-STAT BETA HCG BLOOD, ED (MC, WL, AP ONLY): I-stat hCG, quantitative: <5 m[IU]/mL (ref <5)

## 2017-10-27 LAB — ETHANOL: Alcohol, Ethyl (B): 109 mg/dL — ABNORMAL HIGH (ref <10)

## 2017-10-27 LAB — SALICYLATE LEVEL: Salicylate Lvl: <7 mg/dL (ref 2.8–30.0)

## 2017-10-27 LAB — ACETAMINOPHEN LEVEL

## 2017-10-27 LAB — GLUCOSE, CAPILLARY
Glucose-Capillary: 130 mg/dL — ABNORMAL HIGH (ref 65–99)
Glucose-Capillary: 288 mg/dL — ABNORMAL HIGH (ref 65–99)

## 2017-10-27 MED ORDER — INSULIN ASPART 100 UNIT/ML ~~LOC~~ SOLN
0.0000 [IU] | Freq: Every day | SUBCUTANEOUS | Status: DC
  Administered 2017-10-29 – 2017-10-31 (×2): 2 [IU] via SUBCUTANEOUS

## 2017-10-27 MED ORDER — SODIUM CHLORIDE 0.9 % IV SOLN
INTRAVENOUS | Status: DC
  Administered 2017-10-27 – 2017-10-28 (×4): via INTRAVENOUS

## 2017-10-27 MED ORDER — CHARCOAL ACTIVATED PO LIQD
50.0000 g | Freq: Once | ORAL | Status: AC
  Administered 2017-10-27: 50 g via ORAL
  Filled 2017-10-27: qty 240

## 2017-10-27 MED ORDER — ACETAMINOPHEN 650 MG RE SUPP: 650.0000 mg | Freq: Four times a day (QID) | RECTAL | Status: DC | PRN

## 2017-10-27 MED ORDER — POTASSIUM CHLORIDE CRYS ER 20 MEQ PO TBCR
40.0000 meq | EXTENDED_RELEASE_TABLET | Freq: Once | ORAL | Status: AC
Start: 2017-10-27 — End: 2017-10-27
  Administered 2017-10-27: 40 meq via ORAL
  Filled 2017-10-27: qty 2

## 2017-10-27 MED ORDER — ENOXAPARIN SODIUM 40 MG/0.4ML ~~LOC~~ SOLN
40.0000 mg | SUBCUTANEOUS | Status: DC
  Administered 2017-10-27 – 2017-10-31 (×5): 40 mg via SUBCUTANEOUS
  Filled 2017-10-27 (×5): qty 0.4

## 2017-10-27 MED ORDER — CITALOPRAM HYDROBROMIDE 20 MG PO TABS
10.0000 mg | ORAL_TABLET | Freq: Every day | ORAL | Status: DC
  Administered 2017-10-27 – 2017-11-01 (×6): 10 mg via ORAL
  Filled 2017-10-27 (×7): qty 1

## 2017-10-27 MED ORDER — ONDANSETRON HCL 4 MG/2ML IJ SOLN
4.0000 mg | Freq: Once | INTRAMUSCULAR | Status: AC
  Administered 2017-10-27: 4 mg via INTRAVENOUS
  Filled 2017-10-27: qty 2

## 2017-10-27 MED ORDER — ACETAMINOPHEN 325 MG PO TABS
650.0000 mg | ORAL_TABLET | Freq: Four times a day (QID) | ORAL | Status: DC | PRN
  Administered 2017-10-29 – 2017-10-31 (×4): 650 mg via ORAL
  Filled 2017-10-27 (×4): qty 2

## 2017-10-27 MED ORDER — SODIUM CHLORIDE 0.9 % IV BOLUS (SEPSIS)
1000.0000 mL | Freq: Once | INTRAVENOUS | Status: AC
  Administered 2017-10-27: 1000 mL via INTRAVENOUS

## 2017-10-27 MED ORDER — INSULIN ASPART 100 UNIT/ML ~~LOC~~ SOLN
0.0000 [IU] | Freq: Three times a day (TID) | SUBCUTANEOUS | Status: DC
  Administered 2017-10-27: 11 [IU] via SUBCUTANEOUS
  Administered 2017-10-27: 2 [IU] via SUBCUTANEOUS
  Administered 2017-10-28: 5 [IU] via SUBCUTANEOUS
  Administered 2017-10-28: 8 [IU] via SUBCUTANEOUS
  Administered 2017-10-28: 3 [IU] via SUBCUTANEOUS
  Administered 2017-10-29: 5 [IU] via SUBCUTANEOUS
  Administered 2017-10-29: 3 [IU] via SUBCUTANEOUS
  Administered 2017-10-29: 5 [IU] via SUBCUTANEOUS
  Administered 2017-10-30: 2 [IU] via SUBCUTANEOUS
  Administered 2017-10-30: 5 [IU] via SUBCUTANEOUS
  Administered 2017-10-30 – 2017-10-31 (×3): 3 [IU] via SUBCUTANEOUS
  Administered 2017-11-01: 5 [IU] via SUBCUTANEOUS
  Administered 2017-11-01: 8 [IU] via SUBCUTANEOUS
  Administered 2017-11-01: 2 [IU] via SUBCUTANEOUS
  Filled 2017-10-27: qty 1

## 2017-10-27 MED ORDER — ONDANSETRON HCL 4 MG/2ML IJ SOLN
4.0000 mg | Freq: Four times a day (QID) | INTRAMUSCULAR | Status: DC | PRN
  Administered 2017-10-27 – 2017-10-29 (×3): 4 mg via INTRAVENOUS
  Filled 2017-10-27 (×3): qty 2

## 2017-10-27 NOTE — ED Notes (Signed)
Upon speaking with patient and family, patient got into an argument with her family and locked herself in a room in the house. Daughter states she heard a pill bottle rattling while the patient was in the room alone. Per patient, she only took one metformin, denies wanting to hurt herself, and states she just wanted to sleep. Family unlocked door using a credit card. Patient's daughter states this has happened one time before, but the daughter was able to get pills away from the patient before she took anything.  Patient endorses ETOH, in sleepy on arrival, but calm and cooperative.

## 2017-10-27 NOTE — Progress Notes (Signed)
Patient arrived on unit via stretcher from ED.  Daughter at bedside.  Telemetry placed per MD order. CMT notified. Research scientist (physical sciences)Trained sitter at bedside. Room searched, cleared, and prepared per suicide check list.

## 2017-10-27 NOTE — Progress Notes (Signed)
Patient c/o nausea, has nothing ordered. Provider on call paged for orders. Will c/t monitor.

## 2017-10-27 NOTE — Progress Notes (Signed)
Admission history completed via video interpreter. Psycho social not completed d/t daughter at bedside.

## 2017-10-27 NOTE — ED Triage Notes (Signed)
Pt brought in by EMS from home  Pt got upset tonight after drinking beer and took an unknown amount of metformin 500mg  tablets as an attempt to harm herself  Daughter called EMS  Pt does not speak english but daughter translated for EMS  EMS contacted poison control

## 2017-10-27 NOTE — ED Notes (Signed)
Bed: [facility] Expected date:  Expected time:  Means of arrival:  Comments: 

## 2017-10-27 NOTE — ED Notes (Signed)
ED Provider at bedside. 

## 2017-10-27 NOTE — ED Notes (Signed)
Pt personal clothing removed and placed in labeled belongings bag; pt dress in burgundy scrubs top & bottom, yellow socks.  Personal items are: shirt, leggings, jacket, bra, 2pr shoes (1pr in blk plastic bag). Belongings bag placed in NS cabinets for Rm 19-22. RN advised. Apple ComputerENMiles

## 2017-10-27 NOTE — Progress Notes (Signed)
Phone call from poison control to check on patient. Update given. Will c/t monitor.

## 2017-10-27 NOTE — Consult Note (Signed)
[facility] Face-to-Face Psychiatry Consult   Reason for Consult:  Intentional overdose Referring Physician:  EDP Patient Identification: Shelby Brown MRN:  [id number] Principal Diagnosis: Adjustment disorder with mixed anxiety and depressed mood Diagnosis:   Patient Active Problem List   Diagnosis Date Noted  . Intentional metformin overdose ([place]) [T38.3X2A] 10/27/2017  . Adjustment disorder with mixed anxiety and depressed mood [F43.23] 10/27/2017  . Financial difficulties [Z59.8] 01/06/2014  . Cough [R05] 01/06/2014  . DYSLIPIDEMIA [E78.5] 12/10/2007  . Diabetes type 2, uncontrolled ([place]) [E11.65] 08/07/2006    Total Time spent with patient: 45 minutes  Subjective:   Shelby Brown is a 37 y.o. female patient admitted after she intentionally overdosed on Metformin  HPI:  Shelby Brown is a 37 y.o. female with a history of Type 2DM who denies prior history of mental problem She was brought to the  ED by EMS called by her family after she intentionally overdosed on Metformin. She reports stressed out and overwhelmed due to recurrent arguments with her husband. Patient also reports that she has been feeling depressed due to inability to visit her family in [place]. She has been having crying spells, feeling hopeless, anxious, apprehensive and suicidal. She reports history of overdose about 6 months ago. Patient denies drugs and alcohol abuse.  Past Psychiatric History: none reported  Risk to Self: Is patient at risk for suicide?: Yes Risk to Others:   Prior Inpatient Therapy:   Prior Outpatient Therapy:    Past Medical History:  Past Medical History:  Diagnosis Date  . Diabetes mellitus without complication [facility])     Past Surgical History:  Procedure Laterality Date  . CESAREAN SECTION    . TUBAL LIGATION     Family History:  Family History  Problem Relation Age of Onset  . Cancer Father   . Diabetes Sister   . Diabetes Sister    Family Psychiatric   History:  Social History:  Social History   Substance and Sexual Activity  Alcohol Use No     Social History   Substance and Sexual Activity  Drug Use No    Social History   Socioeconomic History  . Marital status: Married    Spouse name: None  . Number of children: None  . Years of education: None  . Highest education level: None  Social Needs  . Financial resource strain: None  . Food insecurity - worry: None  . Food insecurity - inability: None  . Transportation needs - medical: None  . Transportation needs - non-medical: None  Occupational History  . None  Tobacco Use  . Smoking status: Never Smoker  . Smokeless tobacco: Never Used  Substance and Sexual Activity  . Alcohol use: No  . Drug use: No  . Sexual activity: None  Other Topics Concern  . None  Social History Narrative  . None   Additional Social History:    Allergies:  No Known Allergies  Labs:  Results for orders placed or performed during the hospital encounter of 10/27/17 (from the past 48 hour(s))  CBG monitoring, ED     Status: Abnormal   Collection Time: 10/27/17  3:02 AM  Result Value Ref Range   Glucose-Capillary 317 (H) 65 - 99 mg/dL  Rapid urine drug screen (hospital performed)     Status: None   Collection Time: 10/27/17  3:06 AM  Result Value Ref Range   Opiates NONE DETECTED NONE DETECTED   Cocaine NONE DETECTED NONE DETECTED   Benzodiazepines NONE DETECTED NONE  DETECTED   Amphetamines NONE DETECTED NONE DETECTED   Tetrahydrocannabinol NONE DETECTED NONE DETECTED   Barbiturates NONE DETECTED NONE DETECTED    Comment: (NOTE) DRUG SCREEN FOR MEDICAL PURPOSES ONLY.  IF CONFIRMATION IS NEEDED FOR ANY PURPOSE, NOTIFY LAB WITHIN 5 DAYS. LOWEST DETECTABLE LIMITS FOR URINE DRUG SCREEN Drug Class                     Cutoff (ng/mL) Amphetamine and metabolites    1000 Barbiturate and metabolites    200 Benzodiazepine                 323 Tricyclics and metabolites     300 Opiates  and metabolites        300 Cocaine and metabolites        300 THC                            50 Performed at [facility], [place] [address], [place], [place] [address]   Comprehensive metabolic panel     Status: Abnormal   Collection Time: 10/27/17  3:18 AM  Result Value Ref Range   Sodium 138 135 - 145 mmol/L   Potassium 3.8 3.5 - 5.1 mmol/L   Chloride 103 101 - 111 mmol/L   CO2 24 22 - 32 mmol/L   Glucose, Bld 310 (H) 65 - 99 mg/dL   BUN 7 6 - 20 mg/dL   Creatinine, Ser 0.46 0.44 - 1.00 mg/dL   Calcium 9.3 8.9 - 10.3 mg/dL   Total Protein 7.8 6.5 - 8.1 g/dL   Albumin 4.0 3.5 - 5.0 g/dL   AST 25 15 - 41 U/L   ALT 27 14 - 54 U/L   Alkaline Phosphatase 66 38 - 126 U/L   Total Bilirubin 0.4 0.3 - 1.2 mg/dL   GFR calc non Af Amer >60 >60 mL/min   GFR calc Af Amer >60 >60 mL/min    Comment: (NOTE) The eGFR has been calculated using the CKD EPI equation. This calculation has not been validated in all clinical situations. eGFR's persistently <60 mL/min signify possible Chronic Kidney Disease.    Anion gap 11 5 - 15    Comment: Performed at [facility], [place] [address] [place], [place] [address]  Ethanol     Status: Abnormal   Collection Time: 10/27/17  3:18 AM  Result Value Ref Range   Alcohol, Ethyl (B) 109 (H) <10 mg/dL    Comment:        LOWEST DETECTABLE LIMIT FOR SERUM ALCOHOL IS 10 mg/dL FOR MEDICAL PURPOSES ONLY Performed at [place] [address] [place], [place] [address]   Salicylate level     Status: None   Collection Time: 10/27/17  3:18 AM  Result Value Ref Range   Salicylate Lvl <3.7 2.8 - 30.0 mg/dL    Comment: Performed at [facility], [place] [address] [place], [place] [address]  Acetaminophen level     Status: Abnormal   Collection Time: 10/27/17  3:18 AM  Result Value Ref Range   Acetaminophen (Tylenol), Serum <10 (L) 10 - 30 ug/mL    Comment:        THERAPEUTIC  CONCENTRATIONS VARY SIGNIFICANTLY. A RANGE OF 10-30 ug/mL MAY BE AN EFFECTIVE CONCENTRATION FOR MANY PATIENTS. HOWEVER, SOME ARE BEST TREATED AT CONCENTRATIONS OUTSIDE THIS RANGE. ACETAMINOPHEN CONCENTRATIONS >150 ug/mL AT 4 HOURS AFTER INGESTION  AND >50 ug/mL AT 12 HOURS AFTER INGESTION ARE OFTEN ASSOCIATED WITH TOXIC REACTIONS. Performed at [facility], [place] [address], [place], [place] [address]   cbc     Status: None   Collection Time: 10/27/17  3:18 AM  Result Value Ref Range   WBC 7.6 4.0 - 10.5 K/uL   RBC 4.39 3.87 - 5.11 MIL/uL   Hemoglobin 12.9 12.0 - 15.0 g/dL   HCT 38.4 36.0 - 46.0 %   MCV 87.5 78.0 - 100.0 fL   MCH 29.4 26.0 - 34.0 pg   MCHC 33.6 30.0 - 36.0 g/dL   RDW 13.0 11.5 - 15.5 %   Platelets 366 150 - 400 K/uL    Comment: Performed at [facility], [place] [address] [place] [address] [place], [place] [address]  I-Stat beta hCG blood, ED     Status: None   Collection Time: 10/27/17  3:30 AM  Result Value Ref Range   I-stat hCG, quantitative <5.0 <5 mIU/mL   Comment 3            Comment:   GEST. AGE      CONC.  (mIU/mL)   <=1 WEEK        5 - 50     2 WEEKS       50 - 500     3 WEEKS       100 - 10,000     4 WEEKS     1,000 - 30,000        FEMALE AND NON-PREGNANT FEMALE:     LESS THAN 5 mIU/mL   I-Stat CG4 Lactic Acid, ED     Status: Abnormal   Collection Time: 10/27/17  3:32 AM  Result Value Ref Range   Lactic Acid, Venous 2.85 (HH) 0.5 - 1.9 mmol/L   Comment NOTIFIED PHYSICIAN   Acetaminophen level     Status: Abnormal   Collection Time: 10/27/17  7:00 AM  Result Value Ref Range   Acetaminophen (Tylenol), Serum <10 (L) 10 - 30 ug/mL    Comment:        THERAPEUTIC CONCENTRATIONS VARY SIGNIFICANTLY. A RANGE OF 10-30 ug/mL MAY BE AN EFFECTIVE CONCENTRATION FOR MANY PATIENTS. HOWEVER, SOME ARE BEST TREATED AT CONCENTRATIONS OUTSIDE THIS RANGE. ACETAMINOPHEN CONCENTRATIONS >150 ug/mL AT 4 HOURS AFTER INGESTION AND >50 ug/mL AT  12 HOURS AFTER INGESTION ARE OFTEN ASSOCIATED WITH TOXIC REACTIONS. Performed at [facility], [place] [address], [place], [place] [address]   Salicylate level     Status: None   Collection Time: 10/27/17  7:00 AM  Result Value Ref Range   Salicylate Lvl <0.8 2.8 - 30.0 mg/dL    Comment: Performed at [facility], [place] [address] [place], [place] [address]  Basic metabolic panel     Status: Abnormal   Collection Time: 10/27/17  7:00 AM  Result Value Ref Range   Sodium 134 (L) 135 - 145 mmol/L   Potassium 3.9 3.5 - 5.1 mmol/L   Chloride 103 101 - 111 mmol/L   CO2 21 (L) 22 - 32 mmol/L   Glucose, Bld 405 (H) 65 - 99 mg/dL   BUN 5 (L) 6 - 20 mg/dL   Creatinine, Ser 0.47 0.44 - 1.00 mg/dL   Calcium 8.3 (L) 8.9 - 10.3 mg/dL   GFR calc non Af Amer >60 >60 mL/min   GFR calc Af Amer >60 >60 mL/min    Comment: (NOTE) The eGFR has been calculated using the  CKD EPI equation. This calculation has not been validated in all clinical situations. eGFR's persistently <60 mL/min signify possible Chronic Kidney Disease.    Anion gap 10 5 - 15    Comment: Performed at [facility], [place] [address] [place], [place] [address]  I-Stat CG4 Lactic Acid, ED     Status: Abnormal   Collection Time: 10/27/17  7:28 AM  Result Value Ref Range   Lactic Acid, Venous 5.49 (HH) 0.5 - 1.9 mmol/L   Comment NOTIFIED PHYSICIAN   CBG monitoring, ED     Status: Abnormal   Collection Time: 10/27/17 11:32 AM  Result Value Ref Range   Glucose-Capillary 302 (H) 65 - 99 mg/dL    Current Facility-Administered Medications  Medication Dose Route Frequency Provider Last Rate Last Dose  . 0.9 %  sodium chloride infusion   Intravenous Continuous Rolland Porter, MD 150 mL/hr at 10/27/17 0813    . citalopram (CELEXA) tablet 10 mg  10 mg Oral Daily Makaylee Spielberg, MD      . insulin aspart (novoLOG) injection 0-15 Units  0-15 Units Subcutaneous TID WC Patrecia Pour, MD   11  Units at 10/27/17 1257  . insulin aspart (novoLOG) injection 0-5 Units  0-5 Units Subcutaneous QHS Patrecia Pour, MD       Current Outpatient Medications  Medication Sig Dispense Refill  . Blood Glucose Monitoring Suppl (RELION PRIME MONITOR) DEVI Please use device to check blood sugars four (4) times per day, before meals and at bedtime. 1 Device 0  . glimepiride (AMARYL) 2 MG tablet Take 1 tablet (2 mg total) daily before breakfast by mouth. 30 tablet 3  . glucose blood test strip Use to check blood sugar four (4) times per day, before meals and at bedtime 100 each 12  . metFORMIN (GLUCOPHAGE) 500 MG tablet Take 1 tablet (500 mg total) 2 (two) times daily with a meal for 1 dose by mouth. Take with food. Week 1: take 1/2 tab twice a day. Week 2: take 1 tab in the morning, 1/2 tab at night. Week 3: take 2 tabs twice a day. (Patient taking differently: Take 1,000 mg by mouth 2 (two) times daily with a meal. ) 60 tablet 3  . RELION ULTRA THIN LANCETS 30G MISC Use to check blood sugars four (4) times per day, before meals and at bedtime 100 each 1  . insulin NPH-regular Human (NOVOLIN 70/30) (70-30) 100 UNIT/ML injection Inject 12 Units into the skin 2 (two) times daily with a meal. (Patient not taking: Reported on 08/06/2017) 10 mL 1  . Insulin Syringe-Needle U-100 31G X 15/64" 0.3 ML MISC Use twice daily to administer insulin (Patient not taking: Reported on 08/06/2017) 100 each 1    Musculoskeletal: Strength & Muscle Tone: within normal limits Gait & Station: normal Patient leans: N/A  Psychiatric Specialty Exam: Physical Exam  Psychiatric: Her speech is normal. Judgment normal. Her mood appears anxious. She is withdrawn. Cognition and memory are normal. She exhibits a depressed mood. She expresses suicidal ideation.    Review of Systems  Constitutional: Negative.   HENT: Negative.   Eyes: Negative.   Respiratory: Negative.   Cardiovascular: Negative.   Gastrointestinal: Negative.    Genitourinary: Negative.   Musculoskeletal: Negative.   Skin: Negative.   Neurological: Negative.   Endo/Heme/Allergies: Negative.   Psychiatric/Behavioral: Positive for depression.    Blood pressure 107/70, pulse 96, temperature 97.9 F (36.6 C), temperature source Oral, resp. rate 19, SpO2 100 %.  There is no height or weight on file to calculate BMI.  General Appearance: Casual  Eye Contact:  Good  Speech:  Clear and Coherent  Volume:  Normal  Mood:  Anxious and Depressed  Affect:  Appropriate  Thought Process:  Coherent and Descriptions of Associations: Intact  Orientation:  Full (Time, Place, and Person)  Thought Content:  Logical  Suicidal Thoughts:  Yes.  without intent/plan  Homicidal Thoughts:  No  Memory:  Immediate;   Fair Recent;   Fair Remote;   Good  Judgement:  Impaired  Insight:  Shallow  Psychomotor Activity:  Psychomotor Retardation  Concentration:  Concentration: Fair and Attention Span: Fair  Recall:  [employer] of Knowledge:  Fair  Language:  Fair  Akathisia:  No  Handed:  Right  AIMS (if indicated):     Assets:  Communication Skills Desire for Improvement  ADL's:  Intact  Cognition:  WNL  Sleep:   poor     Treatment Plan Summary: Daily contact with patient to assess and evaluate symptoms and progress in treatment and Medication management  Start Celexa 10 mg daily for anxiety/depression  Disposition: Recommend psychiatric Inpatient admission when patient is medically cleared.  Corena Pilgrim, MD 10/27/2017 1:01 PM

## 2017-10-27 NOTE — ED Notes (Signed)
ED TO INPATIENT HANDOFF REPORT  Name/Age/Gender Shelby Brown 37 y.o. female  Code Status Code Status History    This patient does not have a recorded code status. Please follow your organizational policy for patients in this situation.      Home/SNF/Other Home  Chief Complaint overdose  Level of Care/Admitting Diagnosis ED Disposition    ED Disposition Condition Comment   Admit  Hospital Area: [facility] [[id number]]  Level of Care: Telemetry [5]  Admit to tele based on following criteria: Monitor QTC interval  Diagnosis: Intentional metformin overdose [facility]) [798921]  Admitting Physician: Patrecia Pour [phone]  Attending Physician: Patrecia Pour [phone]  PT Class (Do Not Modify): Observation [104]  PT Acc Code (Do Not Modify): Observation [10022]       Medical History Past Medical History:  Diagnosis Date  . Diabetes mellitus without complication ([place])     Allergies No Known Allergies  IV Location/Drains/Wounds Patient Lines/Drains/Airways Status   Active Line/Drains/Airways    Name:   Placement date:   Placement time:   Site:   Days:   Peripheral IV 10/27/17 Right Antecubital   10/27/17    0350    Antecubital   less than 1          Labs/Imaging Results for orders placed or performed during the hospital encounter of 10/27/17 (from the past 48 hour(s))  CBG monitoring, ED     Status: Abnormal   Collection Time: 10/27/17  3:02 AM  Result Value Ref Range   Glucose-Capillary 317 (H) 65 - 99 mg/dL  Rapid urine drug screen (hospital performed)     Status: None   Collection Time: 10/27/17  3:06 AM  Result Value Ref Range   Opiates NONE DETECTED NONE DETECTED   Cocaine NONE DETECTED NONE DETECTED   Benzodiazepines NONE DETECTED NONE DETECTED   Amphetamines NONE DETECTED NONE DETECTED   Tetrahydrocannabinol NONE DETECTED NONE DETECTED   Barbiturates NONE DETECTED NONE DETECTED    Comment: (NOTE) DRUG SCREEN FOR MEDICAL PURPOSES ONLY.   IF CONFIRMATION IS NEEDED FOR ANY PURPOSE, NOTIFY LAB WITHIN 5 DAYS. LOWEST DETECTABLE LIMITS FOR URINE DRUG SCREEN Drug Class                     Cutoff (ng/mL) Amphetamine and metabolites    1000 Barbiturate and metabolites    200 Benzodiazepine                 144 Tricyclics and metabolites     300 Opiates and metabolites        300 Cocaine and metabolites        300 THC                            50 Performed at [facility], [place] [address] [place], [place] [address]   Comprehensive metabolic panel     Status: Abnormal   Collection Time: 10/27/17  3:18 AM  Result Value Ref Range   Sodium 138 135 - 145 mmol/L   Potassium 3.8 3.5 - 5.1 mmol/L   Chloride 103 101 - 111 mmol/L   CO2 24 22 - 32 mmol/L   Glucose, Bld 310 (H) 65 - 99 mg/dL   BUN 7 6 - 20 mg/dL   Creatinine, Ser 0.46 0.44 - 1.00 mg/dL   Calcium 9.3 8.9 - 10.3 mg/dL   Total Protein 7.8 6.5 - 8.1 g/dL  Albumin 4.0 3.5 - 5.0 g/dL   AST 25 15 - 41 U/L   ALT 27 14 - 54 U/L   Alkaline Phosphatase 66 38 - 126 U/L   Total Bilirubin 0.4 0.3 - 1.2 mg/dL   GFR calc non Af Amer >60 >60 mL/min   GFR calc Af Amer >60 >60 mL/min    Comment: (NOTE) The eGFR has been calculated using the CKD EPI equation. This calculation has not been validated in all clinical situations. eGFR's persistently <60 mL/min signify possible Chronic Kidney Disease.    Anion gap 11 5 - 15    Comment: Performed at [facility], [place] [address], [place], [place] [address]  Ethanol     Status: Abnormal   Collection Time: 10/27/17  3:18 AM  Result Value Ref Range   Alcohol, Ethyl (B) 109 (H) <10 mg/dL    Comment:        LOWEST DETECTABLE LIMIT FOR SERUM ALCOHOL IS 10 mg/dL FOR MEDICAL PURPOSES ONLY Performed at [place] [address], [place], [place] [address]   Salicylate level     Status: None   Collection Time: 10/27/17  3:18 AM  Result Value Ref Range   Salicylate Lvl <2.5  2.8 - 30.0 mg/dL    Comment: Performed at [facility], [place] [address], [place], [place] [address]  Acetaminophen level     Status: Abnormal   Collection Time: 10/27/17  3:18 AM  Result Value Ref Range   Acetaminophen (Tylenol), Serum <10 (L) 10 - 30 ug/mL    Comment:        THERAPEUTIC CONCENTRATIONS VARY SIGNIFICANTLY. A RANGE OF 10-30 ug/mL MAY BE AN EFFECTIVE CONCENTRATION FOR MANY PATIENTS. HOWEVER, SOME ARE BEST TREATED AT CONCENTRATIONS OUTSIDE THIS RANGE. ACETAMINOPHEN CONCENTRATIONS >150 ug/mL AT 4 HOURS AFTER INGESTION AND >50 ug/mL AT 12 HOURS AFTER INGESTION ARE OFTEN ASSOCIATED WITH TOXIC REACTIONS. Performed at [facility], [place] [address] [place], [place] [address]   cbc     Status: None   Collection Time: 10/27/17  3:18 AM  Result Value Ref Range   WBC 7.6 4.0 - 10.5 K/uL   RBC 4.39 3.87 - 5.11 MIL/uL   Hemoglobin 12.9 12.0 - 15.0 g/dL   HCT 38.4 36.0 - 46.0 %   MCV 87.5 78.0 - 100.0 fL   MCH 29.4 26.0 - 34.0 pg   MCHC 33.6 30.0 - 36.0 g/dL   RDW 13.0 11.5 - 15.5 %   Platelets 366 150 - 400 K/uL    Comment: Performed at [facility], [place] [address] [place], [place] [address]  I-Stat beta hCG blood, ED     Status: None   Collection Time: 10/27/17  3:30 AM  Result Value Ref Range   I-stat hCG, quantitative <5.0 <5 mIU/mL   Comment 3            Comment:   GEST. AGE      CONC.  (mIU/mL)   <=1 WEEK        5 - 50     2 WEEKS       50 - 500     3 WEEKS       100 - 10,000     4 WEEKS     1,000 - 30,000        FEMALE AND NON-PREGNANT FEMALE:     LESS THAN 5 mIU/mL   I-Stat CG4 Lactic Acid, ED  Status: Abnormal   Collection Time: 10/27/17  3:32 AM  Result Value Ref Range   Lactic Acid, Venous 2.85 (HH) 0.5 - 1.9 mmol/L   Comment NOTIFIED PHYSICIAN   Acetaminophen level     Status: Abnormal   Collection Time: 10/27/17  7:00 AM  Result Value Ref Range   Acetaminophen (Tylenol), Serum <10 (L) 10 -  30 ug/mL    Comment:        THERAPEUTIC CONCENTRATIONS VARY SIGNIFICANTLY. A RANGE OF 10-30 ug/mL MAY BE AN EFFECTIVE CONCENTRATION FOR MANY PATIENTS. HOWEVER, SOME ARE BEST TREATED AT CONCENTRATIONS OUTSIDE THIS RANGE. ACETAMINOPHEN CONCENTRATIONS >150 ug/mL AT 4 HOURS AFTER INGESTION AND >50 ug/mL AT 12 HOURS AFTER INGESTION ARE OFTEN ASSOCIATED WITH TOXIC REACTIONS. Performed at [facility], [place] [address], [place], [place] [address]   Salicylate level     Status: None   Collection Time: 10/27/17  7:00 AM  Result Value Ref Range   Salicylate Lvl <4.4 2.8 - 30.0 mg/dL    Comment: Performed at [facility], [place] [address], [place], [place] [address]  Basic metabolic panel     Status: Abnormal   Collection Time: 10/27/17  7:00 AM  Result Value Ref Range   Sodium 134 (L) 135 - 145 mmol/L   Potassium 3.9 3.5 - 5.1 mmol/L   Chloride 103 101 - 111 mmol/L   CO2 21 (L) 22 - 32 mmol/L   Glucose, Bld 405 (H) 65 - 99 mg/dL   BUN 5 (L) 6 - 20 mg/dL   Creatinine, Ser 0.47 0.44 - 1.00 mg/dL   Calcium 8.3 (L) 8.9 - 10.3 mg/dL   GFR calc non Af Amer >60 >60 mL/min   GFR calc Af Amer >60 >60 mL/min    Comment: (NOTE) The eGFR has been calculated using the CKD EPI equation. This calculation has not been validated in all clinical situations. eGFR's persistently <60 mL/min signify possible Chronic Kidney Disease.    Anion gap 10 5 - 15    Comment: Performed at [facility], [place] [address], [place], [place] [address]  I-Stat CG4 Lactic Acid, ED     Status: Abnormal   Collection Time: 10/27/17  7:28 AM  Result Value Ref Range   Lactic Acid, Venous 5.49 (HH) 0.5 - 1.9 mmol/L   Comment NOTIFIED PHYSICIAN   CBG monitoring, ED     Status: Abnormal   Collection Time: 10/27/17 11:32 AM  Result Value Ref Range   Glucose-Capillary 302 (H) 65 - 99 mg/dL   No results found.  Pending Labs Unresulted Labs (From admission, onward)    Start     Ordered   10/27/17 1300  Lactic acid, plasma  Now then every 6 hours,   R     10/27/17 1020   10/27/17 0263  Basic metabolic panel  Now then every 6 hours,   R     10/27/17 1023   10/27/17 1300  Hemoglobin A1c  Once-Timed,   R     10/27/17 1109   Signed and Held  HIV antibody (Routine Testing)  Once-Timed,   R     Signed and Held   Signed and Held  Creatinine, serum  (enoxaparin (LOVENOX)    CrCl >/= 30 ml/min)  Weekly,   R    Comments:  while on enoxaparin therapy    Signed and Held      Vitals/Pain Today's Vitals   10/27/17 1338 10/27/17 1400 10/27/17 1408 10/27/17 1430  BP: 107/71 99/68 102/68 92/64  Pulse: 89 89 93 92  Resp: _0 Temp:      TempSrc:      SpO2: 100% 100% 100% 100%  PainSc:        Isolation Precautions No active isolations  Medications Medications  0.9 %  sodium chloride infusion ( Intravenous New Bag/Given 10/27/17 0813)  insulin aspart (novoLOG) injection 0-15 Units (11 Units Subcutaneous Given 10/27/17 1257)  insulin aspart (novoLOG) injection 0-5 Units (not administered)  citalopram (CELEXA) tablet 10 mg (not administered)  charcoal activated (NO SORBITOL) (ACTIDOSE-AQUA) suspension 50 g (50 g Oral Given 10/27/17 0329)  sodium chloride 0.9 % bolus 1,000 mL (0 mLs Intravenous Stopped 10/27/17 0430)  ondansetron (ZOFRAN) injection 4 mg (4 mg Intravenous Given 10/27/17 0813)    Mobility walks

## 2017-10-27 NOTE — H&P (Addendum)
History and Physical   Ricci Paff WUJ:[id number] DOB: 08/06/1981 DOA: 10/27/2017  Referring MD/NP/PA: Devoria Albe, MD, EDP PCP: None  Patient coming from: Home  Chief Complaint: Metformin overdose  HPI: Shelby Brown is a 37 y.o. female with a history of T2DM who presented to the ED by EMS called by her family after suspected ingestion of metformin. She reports ongoing stress due to her husband not speaking to her for the past 3 days regarding an argument that she states is inconsequential. After not sleeping all this time, she drank one beer and locked herself in a bedroom and took 7 metformin tablets (previously only admitted to taking 1 pill) to go to sleep last night. Her husband gained access to the room and called EMS. Reportedly she has been crying persistently, appearing depressed, symptoms which have been constant, worsening, now severe. She is prescribed glimepiride and insulin but takes neither, does not check blood sugars. She reports feeling safe at home, denies any history of domestic violence.   Interviewed with [employer] video interpretor [id number] [phone].  ED Course: Given charcoal 1g/kg, given IV fluids, lactic acid elevated and trended upward. Poison control contacted, recommending continued lab and telemetry monitoring.   Review of Systems: Denies fever, chills, weight loss, changes in vision or hearing, headache, cough, sore throat, chest pain, palpitations, shortness of breath, abdominal pain, nausea, vomiting, changes in bowel habits, blood in stool, change in bladder habits, myalgias, arthralgias, and rash.  Past Medical History:  Diagnosis Date  . Diabetes mellitus without complication [facility])    Past Surgical History:  Procedure Laterality Date  . CESAREAN SECTION    . TUBAL LIGATION     - Nonsmoker, drinks alcohol but denies binge drinking. Lives at home with husband and children.  reports that  has never smoked. she has never used smokeless tobacco.  She reports that she does not drink alcohol or use drugs. No Known Allergies Family History  Problem Relation Age of Onset  . Cancer Father   . Diabetes Sister   . Diabetes Sister    - Family history otherwise reviewed and not pertinent.  Prior to Admission medications   Medication Sig Start Date End Date Taking? Authorizing Provider  Blood Glucose Monitoring Suppl (RELION PRIME MONITOR) DEVI Please use device to check blood sugars four (4) times per day, before meals and at bedtime. 01/06/14  Yes Cater, Luis Abed, MD  glimepiride (AMARYL) 2 MG tablet Take 1 tablet (2 mg total) daily before breakfast by mouth. 08/06/17  Yes McVey, Madelaine Bhat, PA-C  glucose blood test strip Use to check blood sugar four (4) times per day, before meals and at bedtime 01/06/14  Yes Cater, Luis Abed, MD  metFORMIN (GLUCOPHAGE) 500 MG tablet Take 1 tablet (500 mg total) 2 (two) times daily with a meal for 1 dose by mouth. Take with food. Week 1: take 1/2 tab twice a day. Week 2: take 1 tab in the morning, 1/2 tab at night. Week 3: take 2 tabs twice a day. Patient taking differently: Take 1,000 mg by mouth 2 (two) times daily with a meal.  08/06/17 10/27/17 Yes McVey, Madelaine Bhat, PA-C  RELION ULTRA THIN LANCETS 30G MISC Use to check blood sugars four (4) times per day, before meals and at bedtime 01/06/14  Yes Cater, Luis Abed, MD  insulin NPH-regular Human (NOVOLIN 70/30) (70-30) 100 UNIT/ML injection Inject 12 Units into the skin 2 (two) times daily with a meal. Patient not taking: Reported on 08/06/2017 01/06/14  Cater, Luis Abed, MD  Insulin Syringe-Needle U-100 31G X 15/64" 0.3 ML MISC Use twice daily to administer insulin Patient not taking: Reported on 08/06/2017 01/06/14   Lacie Scotts, MD    Physical Exam: Vitals:   10/27/17 0930 10/27/17 1000 10/27/17 1030 10/27/17 1100  BP: 102/70 97/69 107/69 109/71  Pulse: 94 89 94 93  Resp: (!) 7 12 16 16   Temp:      TempSrc:      SpO2: 98% 97% 100% 100%    Constitutional: 37 y.o. female in no distress, calm demeanor Eyes: Lids and conjunctivae normal, PERRL ENMT: Mucous membranes are moist. Posterior pharynx clear of any exudate or lesions. Poor dentition.  Neck: normal, supple, no masses, no thyromegaly Respiratory: Non-labored breathing without accessory muscle use. Clear breath sounds to auscultation bilaterally Cardiovascular: Regular rate and rhythm, no murmurs, rubs, or gallops. No carotid bruits. No JVD. No LE edema. 2+ pedal pulses. Abdomen: Normoactive bowel sounds. No tenderness, non-distended, and no masses palpated. No hepatosplenomegaly. GU: No indwelling catheter Musculoskeletal: No clubbing / cyanosis. No joint deformity upper and lower extremities. Good ROM, no contractures. Normal muscle tone.  Skin: Warm, dry. No rashes, wounds, no ulcers. No significant lesions noted.  Neurologic: CN II-XII grossly intact. Gait narrow-based. No aphasia. No focal deficits in motor strength noted. Decreased sensation to light touch in stocking distribution bilaterally.  Psychiatric: Alert and oriented x3. Initially smiling, denying any events had transpired. Ultimately tearful when discussing recent events. Denies current SI.   Labs on Admission: I have personally reviewed following labs and imaging studies  CBC: Recent Labs  Lab 10/27/17 0318  WBC 7.6  HGB 12.9  HCT 38.4  MCV 87.5  PLT 366   Basic Metabolic Panel: Recent Labs  Lab 10/27/17 0318 10/27/17 0700  NA 138 134*  K 3.8 3.9  CL 103 103  CO2 24 21*  GLUCOSE 310* 405*  BUN 7 5*  CREATININE 0.46 0.47  CALCIUM 9.3 8.3*   GFR: CrCl cannot be calculated (Unknown ideal weight.). Liver Function Tests: Recent Labs  Lab 10/27/17 0318  AST 25  ALT 27  ALKPHOS 66  BILITOT 0.4  PROT 7.8  ALBUMIN 4.0   CBG: Recent Labs  Lab 10/27/17 0302  GLUCAP 317*    EKG: Independently reviewed. NSR vent rate 94bpm, narrow QRS, no segment elevations, QTc  .  Assessment/Plan Active Problems:   Diabetes type 2, uncontrolled (HCC)   Intentional metformin overdose (H[facility])   Intentional metformin overdose with lactic acidosis: Pt denies alternative agents ingested. Reports drinking 1 beer prior to episode, EtOH 109. UDS, salicylate, APAP negative x2, HCG negative. Intention was to sleep, denies current SI. Has reported OD attempt 7 months previously. - Got charcoal at arrival. Lactic acid elevated and trending upward. Will recheck at 6 hour intervals with BMP per poison control recommendations. Given 2L NS and continuing IVF at 150cc/hr. No evidence of edema.  - Monitor on telemetry for hypotension, mental status changes. - Psychiatry consulted. Suicide precautions, safety sitter present.   Poorly-controlled T2DM: Hyperglycemic without DKA. Prescribed insulin, but has refused this, doesn't check CBGs. Reported Dx at age 52.  - Start SSI AC/HS.  - Check HbA1c - Holding oral medications - Consider treatment of peripheral neuropathy if symptoms not improved with better glucose control.  DVT prophylaxis: Lovenox  Code Status: Full  Family Communication: Daughter at bedside, asked to leave for most of conversation. Disposition Plan: Uncertain Consults called: Psychiatry by EMR  Admission status:  Observation    Hazeline Junkeryan Fransisco Messmer, MD [facility] Pager [phone]  If 7PM-7AM, please contact night-coverage [email or web address] Password [email or web address] 10/27/2017, 11:28 AM

## 2017-10-27 NOTE — ED Notes (Addendum)
Contacted by Duwayne Heckanielle at poison control, recommendations are as follows:   Monitor for lactic acidosis and renal failure Activated charcoal without sorbitol 1g/kg  CBC, CMet, and lactic acid Repeat CMet and Lactic acid in 4 hours Monitor patient for at least 6 hours

## 2017-10-27 NOTE — Progress Notes (Signed)
Provider on call paged again for c/o nausea and patient has now vomited. Given cool cloth. SI sitter and husband at beside. Will c/t monitor.

## 2017-10-27 NOTE — ED Provider Notes (Signed)
[facility]-EMERGENCY DEPT Provider Note   CSN: [id number] Arrival date & time: 10/27/17  0242  Time seen 03:45 AM   History   Chief Complaint Chief Complaint  Patient presents with  . Ingestion   Level 5 caveat due to Spanish-speaking  HPI Shelby Brown is a 37 y.o. female.  HPI daughter states there was a family argument earlier this evening.  The patient locked herself in her bedroom and they were able to get in using a credit card.  She states that she took 1 of her Metformin about 1 to 1:30 AM "so she could sleep".  Patient is diabetic for about 18 years.  She is supposed to be taking metformin and glimepiride.  Daughter thinks she ran out of her glimepiride.  There were still about 11 pills and her metformin bottle however daughter is not sure she takes it every day or not.  Daughter states about 7 months ago patient tried to overdose however the family noticed she had the bottles and took the pill bottles away from her.  She is never been admitted to a mental health facility.  Daughter states the family has noticed that she seems depressed.  They states she cries a lot at night.  She states she is eating and drinking well.  PCP Patient, No Pcp Per   Past Medical History:  Diagnosis Date  . Diabetes mellitus without complication [facility])     Patient Active Problem List   Diagnosis Date Noted  . Financial difficulties 01/06/2014  . Cough 01/06/2014  . DYSLIPIDEMIA 12/10/2007  . Diabetes type 2, uncontrolled (HCC) 08/07/2006    Past Surgical History:  Procedure Laterality Date  . CESAREAN SECTION    . TUBAL LIGATION      OB History    No data available       Home Medications    Prior to Admission medications   Medication Sig Start Date End Date Taking? Authorizing Provider  Blood Glucose Monitoring Suppl (RELION PRIME MONITOR) DEVI Please use device to check blood sugars four (4) times per day, before meals and at bedtime. 01/06/14  Yes  Cater, Luis Abed, MD  glimepiride (AMARYL) 2 MG tablet Take 1 tablet (2 mg total) daily before breakfast by mouth. 08/06/17  Yes McVey, Madelaine Bhat, PA-C  glucose blood test strip Use to check blood sugar four (4) times per day, before meals and at bedtime 01/06/14  Yes Cater, Luis Abed, MD  metFORMIN (GLUCOPHAGE) 500 MG tablet Take 1 tablet (500 mg total) 2 (two) times daily with a meal for 1 dose by mouth. Take with food. Week 1: take 1/2 tab twice a day. Week 2: take 1 tab in the morning, 1/2 tab at night. Week 3: take 2 tabs twice a day. Patient taking differently: Take 1,000 mg by mouth 2 (two) times daily with a meal.  08/06/17 10/27/17 Yes McVey, Madelaine Bhat, PA-C  RELION ULTRA THIN LANCETS 30G MISC Use to check blood sugars four (4) times per day, before meals and at bedtime 01/06/14  Yes Cater, Luis Abed, MD  insulin NPH-regular Human (NOVOLIN 70/30) (70-30) 100 UNIT/ML injection Inject 12 Units into the skin 2 (two) times daily with a meal. Patient not taking: Reported on 08/06/2017 01/06/14   Lacie Scotts, MD  Insulin Syringe-Needle U-100 31G X 15/64" 0.3 ML MISC Use twice daily to administer insulin Patient not taking: Reported on 08/06/2017 01/06/14   Cater, Luis Abed, MD    Family History Family  History  Problem Relation Age of Onset  . Cancer Father   . Diabetes Sister   . Diabetes Sister     Social History Social History   Tobacco Use  . Smoking status: Never Smoker  . Smokeless tobacco: Never Used  Substance Use Topics  . Alcohol use: No  . Drug use: No  unemployed   Allergies   Patient has no known allergies.   Review of Systems Review of Systems  All other systems reviewed and are negative.    Physical Exam Updated Vital Signs BP 90/60   Pulse 94   Temp 98.2 F (36.8 C) (Oral)   Resp 11   SpO2 97%   Physical Exam  Constitutional: She is oriented to person, place, and time. She appears well-developed and well-nourished.  Non-toxic appearance. She  does not appear ill. No distress.  HENT:  Head: Normocephalic and atraumatic.  Right Ear: External ear normal.  Left Ear: External ear normal.  Nose: Nose normal. No mucosal edema or rhinorrhea.  Mouth/Throat: Oropharynx is clear and moist and mucous membranes are normal. No dental abscesses or uvula swelling.  Eyes: Conjunctivae and EOM are normal. Pupils are equal, round, and reactive to light.  Neck: Normal range of motion and full passive range of motion without pain. Neck supple.  Cardiovascular: Normal rate, regular rhythm and normal heart sounds. Exam reveals no gallop and no friction rub.  No murmur heard. Pulmonary/Chest: Effort normal and breath sounds normal. No respiratory distress. She has no wheezes. She has no rhonchi. She has no rales. She exhibits no tenderness and no crepitus.  Abdominal: Soft. Normal appearance and bowel sounds are normal. She exhibits no distension. There is no tenderness. There is no rebound and no guarding.  Musculoskeletal: Normal range of motion. She exhibits no edema or tenderness.  Moves all extremities well.   Neurological: She is alert and oriented to person, place, and time. She has normal strength. No cranial nerve deficit.  Skin: Skin is warm, dry and intact. No rash noted. No erythema. No pallor.  Psychiatric: She has a normal mood and affect. Her speech is normal and behavior is normal.  Smiling and laughing with her daughter  Nursing note and vitals reviewed.    ED Treatments / Results  Labs (all labs ordered are listed, but only abnormal results are displayed) Results for orders placed or performed during the hospital encounter of 10/27/17  Comprehensive metabolic panel  Result Value Ref Range   Sodium 138 135 - 145 mmol/L   Potassium 3.8 3.5 - 5.1 mmol/L   Chloride 103 101 - 111 mmol/L   CO2 24 22 - 32 mmol/L   Glucose, Bld 310 (H) 65 - 99 mg/dL   BUN 7 6 - 20 mg/dL   Creatinine, Ser 1.61 0.44 - 1.00 mg/dL   Calcium 9.3 8.9 -  09.6 mg/dL   Total Protein 7.8 6.5 - 8.1 g/dL   Albumin 4.0 3.5 - 5.0 g/dL   AST 25 15 - 41 U/L   ALT 27 14 - 54 U/L   Alkaline Phosphatase 66 38 - 126 U/L   Total Bilirubin 0.4 0.3 - 1.2 mg/dL   GFR calc non Af Amer >60 >60 mL/min   GFR calc Af Amer >60 >60 mL/min   Anion gap 11 5 - 15  Ethanol  Result Value Ref Range   Alcohol, Ethyl (B) 109 (H) <10 mg/dL  Salicylate level  Result Value Ref Range   Salicylate Lvl <7.0 2.8 -  30.0 mg/dL  Acetaminophen level  Result Value Ref Range   Acetaminophen (Tylenol), Serum <10 (L) 10 - 30 ug/mL  cbc  Result Value Ref Range   WBC 7.6 4.0 - 10.5 K/uL   RBC 4.39 3.87 - 5.11 MIL/uL   Hemoglobin 12.9 12.0 - 15.0 g/dL   HCT 47.8 29.5 - 62.1 %   MCV 87.5 78.0 - 100.0 fL   MCH 29.4 26.0 - 34.0 pg   MCHC 33.6 30.0 - 36.0 g/dL   RDW 30.8 65.7 - 84.6 %   Platelets 366 150 - 400 K/uL  Rapid urine drug screen (hospital performed)  Result Value Ref Range   Opiates NONE DETECTED NONE DETECTED   Cocaine NONE DETECTED NONE DETECTED   Benzodiazepines NONE DETECTED NONE DETECTED   Amphetamines NONE DETECTED NONE DETECTED   Tetrahydrocannabinol NONE DETECTED NONE DETECTED   Barbiturates NONE DETECTED NONE DETECTED  Acetaminophen level  Result Value Ref Range   Acetaminophen (Tylenol), Serum <10 (L) 10 - 30 ug/mL  Salicylate level  Result Value Ref Range   Salicylate Lvl <7.0 2.8 - 30.0 mg/dL  Basic metabolic panel  Result Value Ref Range   Sodium 134 (L) 135 - 145 mmol/L   Potassium 3.9 3.5 - 5.1 mmol/L   Chloride 103 101 - 111 mmol/L   CO2 21 (L) 22 - 32 mmol/L   Glucose, Bld 405 (H) 65 - 99 mg/dL   BUN 5 (L) 6 - 20 mg/dL   Creatinine, Ser 9.62 0.44 - 1.00 mg/dL   Calcium 8.3 (L) 8.9 - 10.3 mg/dL   GFR calc non Af Amer >60 >60 mL/min   GFR calc Af Amer >60 >60 mL/min   Anion gap 10 5 - 15  CBG monitoring, ED  Result Value Ref Range   Glucose-Capillary 317 (H) 65 - 99 mg/dL  I-Stat beta hCG blood, ED  Result Value Ref Range   I-stat  hCG, quantitative <5.0 <5 mIU/mL   Comment 3          I-Stat CG4 Lactic Acid, ED  Result Value Ref Range   Lactic Acid, Venous 2.85 (HH) 0.5 - 1.9 mmol/L   Comment NOTIFIED PHYSICIAN   I-Stat CG4 Lactic Acid, ED  Result Value Ref Range   Lactic Acid, Venous 5.49 (HH) 0.5 - 1.9 mmol/L   Comment NOTIFIED PHYSICIAN    Laboratory interpretation all normal except hyperglycemia without metabolic acidosis, however lactic acidosis is present and increased.     EKG  EKG Interpretation  Date/Time:  Saturday October 27 2017 08:04:07 EST Ventricular Rate:  94 PR Interval:    QRS Duration: 83 QT Interval:  387 QTC Calculation: 484 R Axis:   65 Text Interpretation:  Sinus rhythm Low voltage, precordial leads Otherwise within normal limits No old tracing to compare Confirmed by Devoria Albe ([id number]) on 10/27/2017 8:37:52 AM       Radiology No results found.  Procedures .Critical Care Performed by: Devoria Albe, MD Authorized by: Devoria Albe, MD   Critical care provider statement:    Critical care time (minutes):  31   Critical care was necessary to treat or prevent imminent or life-threatening deterioration of the following conditions:  Dehydration and metabolic crisis   Critical care was time spent personally by me on the following activities:  Discussions with consultants, evaluation of patient's response to treatment, obtaining history from patient or surrogate, ordering and review of laboratory studies and re-evaluation of patient's condition   (including critical care time)  Medications  Ordered in ED Medications  0.9 %  sodium chloride infusion ( Intravenous New Bag/Given 10/27/17 0813)  charcoal activated (NO SORBITOL) (ACTIDOSE-AQUA) suspension 50 g (50 g Oral Given 10/27/17 0329)  sodium chloride 0.9 % bolus 1,000 mL (0 mLs Intravenous Stopped 10/27/17 0430)  ondansetron (ZOFRAN) injection 4 mg (4 mg Intravenous Given 10/27/17 0813)     Initial Impression / Assessment and Plan / ED  Course  I have reviewed the triage vital signs and the nursing notes.  Pertinent labs & imaging results that were available during my care of the patient were reviewed by me and considered in my medical decision making (see chart for details).     Poison control was contacted.  Patient was given charcoal 50 g, 1 g/kg, orally.  After reviewing her lactic acid which was elevated she was started on IV fluids.  Her labs will be repeated in 4 hours.  Patient's 4-hour lactic acid was increased.  I am going to talk to poison control about her care.  7:54 AM patient was discussed with poison control.  They state to continue IV fluids so that she has urinary output 1-2 cc/kg/h, they recommend EKG not specifically for this overdose to to but to make sure she did not ingest something else.  They suggest serial lactic acid levels every 6 hours with electrolytes.  They state to watch for mental status change, hypotension, and to keep her on a cardiac monitor.  She states he has patient can have late side effects from their ingestion.  They also can require dialysis if they have severe symptoms.  08:16 AM Dr Jarvis NewcomerGrunz, hospitalist, will admit.  Final Clinical Impressions(s) / ED Diagnoses   Final diagnoses:  Metformin overdose, initial encounter  Lactic acidosis    Plan admission  Devoria AlbeIva Geni Skorupski, MD, Concha PyoFACEP    Karnell Vanderloop, MD 10/27/17 [phone]

## 2017-10-27 NOTE — Progress Notes (Signed)
CRITICAL VALUE ALERT  Critical Value:  Lactic acid 2.2  Date & Time Notied:  2/919  Provider Notified: yes via text page  Orders Received/Actions taken:

## 2017-10-28 DIAGNOSIS — F4323 Adjustment disorder with mixed anxiety and depressed mood: Secondary | ICD-10-CM

## 2017-10-28 LAB — BASIC METABOLIC PANEL
ANION GAP: 7 (ref 5–15)
Anion gap: 5 (ref 5–15)
BUN: 8 mg/dL (ref 6–20)
BUN: 7 mg/dL (ref 6–20)
CALCIUM: 8.4 mg/dL — AB (ref 8.9–10.3)
CALCIUM: 7.9 mg/dL — AB (ref 8.9–10.3)
CHLORIDE: 108 mmol/L (ref 101–111)
CO2: 23 mmol/L (ref 22–32)
CO2: 24 mmol/L (ref 22–32)
CREATININE: 0.46 mg/dL (ref 0.44–1.00)
Chloride: 107 mmol/L (ref 101–111)
Creatinine, Ser: 0.55 mg/dL (ref 0.44–1.00)
GFR calc Af Amer: >60 mL/min (ref >60)
GFR calc Af Amer: >60 mL/min (ref >60)
GFR calc non Af Amer: >60 mL/min (ref >60)
GFR calc non Af Amer: >60 mL/min (ref >60)
GLUCOSE: 302 mg/dL — AB (ref 65–99)
GLUCOSE: 242 mg/dL — AB (ref 65–99)
POTASSIUM: 4 mmol/L (ref 3.5–5.1)
Potassium: 4.1 mmol/L (ref 3.5–5.1)
Sodium: 138 mmol/L (ref 135–145)
Sodium: 136 mmol/L (ref 135–145)

## 2017-10-28 LAB — GLUCOSE, CAPILLARY
GLUCOSE-CAPILLARY: 152 mg/dL — AB (ref 65–99)
GLUCOSE-CAPILLARY: 154 mg/dL — AB (ref 65–99)
Glucose-Capillary: 263 mg/dL — ABNORMAL HIGH (ref 65–99)
Glucose-Capillary: 219 mg/dL — ABNORMAL HIGH (ref 65–99)

## 2017-10-28 LAB — HIV ANTIBODY (ROUTINE TESTING W REFLEX): HIV Screen 4th Generation wRfx: NONREACTIVE

## 2017-10-28 LAB — LACTIC ACID, PLASMA
LACTIC ACID, VENOUS: 0.9 mmol/L (ref 0.5–1.9)
Lactic Acid, Venous: 0.9 mmol/L (ref 0.5–1.9)

## 2017-10-28 MED ORDER — INFLUENZA VAC SPLIT QUAD 0.5 ML IM SUSY
0.5000 mL | PREFILLED_SYRINGE | INTRAMUSCULAR | Status: AC
  Administered 2017-10-29: 0.5 mL via INTRAMUSCULAR
  Filled 2017-10-28: qty 0.5

## 2017-10-28 NOTE — Progress Notes (Signed)
PROGRESS NOTE    Shelby Brown  ZOX:[id number] DOB: Feb 08, 1981 DOA: 10/27/2017 PCP: Patient, No Pcp Per  Brief Narrative:  37 year old with past medical history relevant for type 2 diabetes who was admitted yesterday after ingesting metformin and an apparent suicide attempt.  Her lactate was elevated on admission however dramatically cleared quickly and she is pending likely inpatient psychiatry evaluation.   Assessment & Plan:   Principal Problem:   Adjustment disorder with mixed anxiety and depressed mood Active Problems:   Diabetes type 2, uncontrolled (HCC)   Intentional metformin overdose (HCC)   ##) Overdose attempt: -Lactate this morning is normalized -Labs are reassuring - Psychology following appreciate recommendations -Sitter in room - Patient will likely need inpatient evaluation and treatment  ##) Type 2 diabetes: Patient was treated with oral hypoglycemics -Monitor  Fluids: Discontinue IV fluids as patient is tolerating p.o. Electrolytes: Monitor and supplement Nutrition: ADA diet  Prophylaxis: Ambulatory  Disposition: Likely inpatient psychiatry  Full code   Consultants:   Psychiatry  Procedures: (Don't include imaging studies which can be auto populated. Include things that cannot be auto populated i.e. Echo, Carotid and venous dopplers, Foley, Bipap, HD, tubes/drains, wound vac, central lines etc)  None  Antimicrobials: (specify start and planned stop date. Auto populated tables are space occupying and do not give end dates)  None   Subjective: Patient reports that she is ready to go home.  She denies any current suicidal or homicidal ideation.  Family is eager to take her back.  Objective: Vitals:   10/27/17 1430 10/27/17 1507 10/27/17 2154 10/28/17 0616  BP: 92/64 116/73 113/70 115/82  Pulse: 92 95 83 81  Resp: 16 16 16 16   Temp:  100 F (37.8 C) 98.5 F (36.9 C) 98.6 F (37 C)  TempSrc: Oral Oral Oral Oral  SpO2: 100% 100%  98% 99%    Intake/Output Summary (Last 24 hours) at 10/28/2017 1213 Last data filed at 10/28/2017 0900 Gross per 24 hour  Intake 2537.5 ml  Output -  Net 2537.5 ml   There were no vitals filed for this visit.  Examination:  General exam: No acute distress Respiratory system: Clear to auscultation. Respiratory effort normal. Cardiovascular system: S1 & S2 heard, RRR. No JVD, murmurs, rubs, gallops or clicks. No pedal edema. Gastrointestinal system: Abdomen is nondistended, soft and nontender. No organomegaly or masses felt. Normal bowel sounds heard. Central nervous system: Grossly intact Extremities: No lower extremity edema Skin: No rashes on visible skin Psychiatry: Judgement and insight appear normal. Mood & affect appropriate.     Data Reviewed: I have personally reviewed following labs and imaging studies  CBC: Recent Labs  Lab 10/27/17 0318  WBC 7.6  HGB 12.9  HCT 38.4  MCV 87.5  PLT 366   Basic Metabolic Panel: Recent Labs  Lab 10/27/17 0700 10/27/17 1421 10/27/17 1856 10/28/17 0124 10/28/17 0641  NA 134* 137 136 138 136  K 3.9 3.1* 3.6 4.0 4.1  CL 103 107 108 108 107  CO2 21* 26 25 23 24   GLUCOSE 405* 165* 243* 302* 242*  BUN 5* 5* 7 8 7   CREATININE 0.47 0.48 0.52 0.55 0.46  CALCIUM 8.3* 8.2* 8.3* 8.4* 7.9*   GFR: CrCl cannot be calculated (Unknown ideal weight.). Liver Function Tests: Recent Labs  Lab 10/27/17 0318  AST 25  ALT 27  ALKPHOS 66  BILITOT 0.4  PROT 7.8  ALBUMIN 4.0   No results for input(s): LIPASE, AMYLASE in the last 168 hours. No  results for input(s): AMMONIA in the last 168 hours. Coagulation Profile: No results for input(s): INR, PROTIME in the last 168 hours. Cardiac Enzymes: No results for input(s): CKTOTAL, CKMB, CKMBINDEX, TROPONINI in the last 168 hours. BNP (last 3 results) No results for input(s): PROBNP in the last 8760 hours. HbA1C: No results for input(s): HGBA1C in the last 72 hours. CBG: Recent Labs    Lab 10/27/17 1132 10/27/17 1702 10/27/17 2113 10/28/17 0746 10/28/17 1200  GLUCAP 302* 130* 288* 263* 152*   Lipid Profile: No results for input(s): CHOL, HDL, LDLCALC, TRIG, CHOLHDL, LDLDIRECT in the last 72 hours. Thyroid Function Tests: No results for input(s): TSH, T4TOTAL, FREET4, T3FREE, THYROIDAB in the last 72 hours. Anemia Panel: No results for input(s): VITAMINB12, FOLATE, FERRITIN, TIBC, IRON, RETICCTPCT in the last 72 hours. Sepsis Labs: Recent Labs  Lab 10/27/17 1421 10/27/17 1856 10/28/17 0124 10/28/17 0641  LATICACIDVEN 2.2* 1.0 0.9 0.9    No results found for this or any previous visit (from the past 240 hour(s)).       Radiology Studies: No results found.      Scheduled Meds: . citalopram  10 mg Oral Daily  . enoxaparin (LOVENOX) injection  40 mg Subcutaneous Q24H  . insulin aspart  0-15 Units Subcutaneous TID WC  . insulin aspart  0-5 Units Subcutaneous QHS   Continuous Infusions: . sodium chloride 150 mL/hr at 10/28/17 0646     LOS: 0 days    Time spent: 30    Delaine Lame, MD [facility] Pager [phone]  If 7PM-7AM, please contact night-coverage [email or web address] Password [email or web address] 10/28/2017, 12:13 PM

## 2017-10-28 NOTE — Progress Notes (Signed)
Pt was awake and lying in bed when I arrived. Her husband was bedside. Pt does not speak English, but her husband volunteered to speak for her as they communicated with each other. Pt's husband said pt is ready to go home and she is ok. He said she was in a lot of pain and could not sleep and took too much medicine. Pt's husband said now that pt has insulin she is ok. Pt's husband said he has missed two days of work and needs to go to work tomorrow and their daughter needs to go to school. Pt's husband said, and pt through husband, said she is ok and will be okay at home. I explained to husband and pt, through husband, that they needed to speak with their doctor about her care and her doctor will determine when it is time for her to go home. Pt's husband said okay. Pt also seemed anxious about getting home and, through husband, said she is ok. I offered additional emotional and spiritual assistance. Their only concern was for pt to get home. Please page if additional support is needed. Chaplain Marjory Liesamela Safwan Tomei Holder, MDiv   10/28/17 1600  Clinical Encounter Type  Visited With Patient and family together

## 2017-10-29 DIAGNOSIS — T383X2D Poisoning by insulin and oral hypoglycemic [antidiabetic] drugs, intentional self-harm, subsequent encounter: Secondary | ICD-10-CM

## 2017-10-29 LAB — CBC
HCT: 31.8 % — ABNORMAL LOW (ref 36.0–46.0)
Hemoglobin: 10.6 g/dL — ABNORMAL LOW (ref 12.0–15.0)
MCH: 30.1 pg (ref 26.0–34.0)
MCHC: 33.3 g/dL (ref 30.0–36.0)
MCV: 90.3 fL (ref 78.0–100.0)
Platelets: 281 K/uL (ref 150–400)
RBC: 3.52 MIL/uL — ABNORMAL LOW (ref 3.87–5.11)
RDW: 13.5 % (ref 11.5–15.5)
WBC: 6 10*3/uL (ref 4.0–10.5)

## 2017-10-29 LAB — GLUCOSE, CAPILLARY
GLUCOSE-CAPILLARY: 209 mg/dL — AB (ref 65–99)
GLUCOSE-CAPILLARY: 200 mg/dL — AB (ref 65–99)
GLUCOSE-CAPILLARY: 238 mg/dL — AB (ref 65–99)
Glucose-Capillary: 248 mg/dL — ABNORMAL HIGH (ref 65–99)

## 2017-10-29 LAB — COMPREHENSIVE METABOLIC PANEL WITH GFR
ALT: 20 U/L (ref 14–54)
CO2: 23 mmol/L (ref 22–32)
Chloride: 107 mmol/L (ref 101–111)
GFR calc Af Amer: >60 mL/min (ref >60)
GFR calc non Af Amer: >60 mL/min (ref >60)
Potassium: 3.9 mmol/L (ref 3.5–5.1)
Sodium: 139 mmol/L (ref 135–145)

## 2017-10-29 LAB — COMPREHENSIVE METABOLIC PANEL
AST: 24 U/L (ref 15–41)
Albumin: 3 g/dL — ABNORMAL LOW (ref 3.5–5.0)
Alkaline Phosphatase: 51 U/L (ref 38–126)
Anion gap: 9 (ref 5–15)
BUN: 10 mg/dL (ref 6–20)
Calcium: 8.3 mg/dL — ABNORMAL LOW (ref 8.9–10.3)
Creatinine, Ser: 0.5 mg/dL (ref 0.44–1.00)
Glucose, Bld: 206 mg/dL — ABNORMAL HIGH (ref 65–99)
Total Bilirubin: 0.4 mg/dL (ref 0.3–1.2)
Total Protein: 6 g/dL — ABNORMAL LOW (ref 6.5–8.1)

## 2017-10-29 LAB — HEMOGLOBIN A1C
Hgb A1c MFr Bld: 13.5 % — ABNORMAL HIGH (ref 4.8–5.6)
Mean Plasma Glucose: 340.75 mg/dL

## 2017-10-29 LAB — MAGNESIUM: Magnesium: 1.9 mg/dL (ref 1.7–2.4)

## 2017-10-29 LAB — LACTIC ACID, PLASMA: Lactic Acid, Venous: 0.8 mmol/L (ref 0.5–1.9)

## 2017-10-29 MED ORDER — INSULIN GLARGINE 100 UNIT/ML ~~LOC~~ SOLN
6.0000 [IU] | Freq: Every day | SUBCUTANEOUS | Status: DC
  Administered 2017-10-29 – 2017-10-31 (×3): 6 [IU] via SUBCUTANEOUS
  Filled 2017-10-29 (×4): qty 0.06

## 2017-10-29 NOTE — Progress Notes (Signed)
Nutrition Brief Note  Patient identified on the Malnutrition Screening Tool (MST) Report  Pt needs new weight measured. Pt has been eating.  Wt Readings from Last 15 Encounters:  08/06/17 114 lb 6.4 oz (51.9 kg)  11/28/14 118 lb 8 oz (53.8 kg)  01/06/14 122 lb 11.2 oz (55.7 kg)    BMI: 20.2 kg/m^2. Patient meets criteria for normal based on current BMI.   Current diet order is CHO modified, patient is consuming approximately 90% of meals at this time. Labs and medications reviewed.   No nutrition interventions warranted at this time. If nutrition issues arise, please consult RD.   Shelby FrancoLindsey Chaynce Schafer, MS, RD, LDN [facility] Clinical Dietitian Pager: [phone] After Hours Pager: [phone]

## 2017-10-29 NOTE — Progress Notes (Addendum)
PROGRESS NOTE                                                                                                                                                                                                             Patient Demographics:    Shelby Brown, is a 37 y.o. female, DOB - May 17, 1981, ZOX:[id number]  Admit date - 10/27/2017   Admitting Physician Tyrone Nineyan B Grunz, MD  Outpatient Primary MD for the patient is Patient, No Pcp Per  LOS - 0  Outpatient Specialists: none  Chief Complaint  Patient presents with  . Ingestion       Brief Narrative   [age] year old female with history of type 2 diabetes mellitus admitted after ingesting several metformin pills the following what appears to be a suicidal attempt.  She had elevated lactate on admission which resolved subsequently. Seen by psychiatry and recommend inpatient psychiatric admission.    Subjective:   Patient seen and examined.  Helped with interpretation by safety sitter at bedside who speaks Spanish.  Complains of some headache and nausea.   Assessment  & Plan :    Principal Problem:   Adjustment disorder with mixed anxiety and depressed mood   Intentional metformin overdose (HCC) Patient denies that she was suicidal and reports that she took metformin tablets to help her sleep since she was not getting enough sleep for last few days.  On reviewing hospital admission and psychiatry notes it appeared that she had argument with her husband and was also depressed not being able to meet her family back in [place].  Reportedly she had been crying persistently with and depressed most of the time recently. Patient denies any suicidal ideation at this time or feeling depressed and wants to go home.  Continue Recruitment consultantsafety sitter.  She will need admission to inpatient behavioral health unless she is seen by psychiatry again and cleared her for discharge. Started on  Celexa.    Active Problems:   Diabetes type 2, uncontrolled (HCC) CBG in 180-220. uncontrolled DM with A1C of 13.   Monitoring on sliding scale coverage.  Add bedtime lantus 6 u. Patient is also on Amaryl and Novolin at home.  Reports getting medications through [facility].  Does not have a PCP.       Code Status : Full code  Family Communication  : At  bedside  Disposition Plan  :  inpatient psych admission  Barriers For Discharge : Pending inpatient psych admission  Consults  : Psychiatry  Procedures  : None  DVT Prophylaxis  :  Lovenox -  Lab Results  Component Value Date   PLT 281 10/29/2017    Antibiotics  :    Anti-infectives (From admission, onward)   None        Objective:   Vitals:   10/28/17 0616 10/28/17 1418 10/28/17 2130 10/29/17 0610  BP: 115/82 97/62 122/75 126/74  Pulse: 81 85 81 88  Resp: 16 17 18 18   Temp: 98.6 F (37 C) 98.6 F (37 C) 98.4 F (36.9 C) 98.5 F (36.9 C)  TempSrc: Oral Oral Oral Oral  SpO2: 99% 99% 100% 97%    Wt Readings from Last 3 Encounters:  08/06/17 51.9 kg (114 lb 6.4 oz)  11/28/14 53.8 kg (118 lb 8 oz)  01/06/14 55.7 kg (122 lb 11.2 oz)     Intake/Output Summary (Last 24 hours) at 10/29/2017 1500 Last data filed at 10/29/2017 0615 Gross per 24 hour  Intake 240 ml  Output -  Net 240 ml     Physical Exam  Gen: not in distress HEENT: moist mucosa, supple neck Chest: clear b/l, no added sounds CVS: N S1&S2, no murmurs, GI: soft, NT, ND,  Musculoskeletal: warm, no edema CNS: Alert and oriented, non focal, normal mood and affect    Data Review:    CBC Recent Labs  Lab 10/27/17 0318 10/29/17 0558  WBC 7.6 6.0  HGB 12.9 10.6*  HCT 38.4 31.8*  PLT 366 281  MCV 87.5 90.3  MCH 29.4 30.1  MCHC 33.6 33.3  RDW 13.0 13.5    Chemistries  Recent Labs  Lab 10/27/17 0318  10/27/17 1421 10/27/17 1856 10/28/17 0124 10/28/17 0641 10/29/17 0558  NA 138   < > 137 136 138 136 139  K 3.8   < >  3.1* 3.6 4.0 4.1 3.9  CL 103   < > 107 108 108 107 107  CO2 24   < > 26 25 23 24 23   GLUCOSE 310*   < > 165* 243* 302* 242* 206*  BUN 7   < > 5* 7 8 7 10   CREATININE 0.46   < > 0.48 0.52 0.55 0.46 0.50  CALCIUM 9.3   < > 8.2* 8.3* 8.4* 7.9* 8.3*  MG  --   --   --   --   --   --  1.9  AST 25  --   --   --   --   --  24  ALT 27  --   --   --   --   --  20  ALKPHOS 66  --   --   --   --   --  51  BILITOT 0.4  --   --   --   --   --  0.4   < > = values in this interval not displayed.   ------------------------------------------------------------------------------------------------------------------ No results for input(s): CHOL, HDL, LDLCALC, TRIG, CHOLHDL, LDLDIRECT in the last 72 hours.  Lab Results  Component Value Date   HGBA1C 13.5 (H) 10/27/2017   ------------------------------------------------------------------------------------------------------------------ No results for input(s): TSH, T4TOTAL, T3FREE, THYROIDAB in the last 72 hours.  Invalid input(s): FREET3 ------------------------------------------------------------------------------------------------------------------ No results for input(s): VITAMINB12, FOLATE, FERRITIN, TIBC, IRON, RETICCTPCT in the last 72 hours.  Coagulation profile No results for input(s): INR, PROTIME in the  last 168 hours.  No results for input(s): DDIMER in the last 72 hours.  Cardiac Enzymes No results for input(s): CKMB, TROPONINI, MYOGLOBIN in the last 168 hours.  Invalid input(s): CK ------------------------------------------------------------------------------------------------------------------ No results found for: BNP  Inpatient Medications  Scheduled Meds: . citalopram  10 mg Oral Daily  . enoxaparin (LOVENOX) injection  40 mg Subcutaneous Q24H  . Influenza vac split quadrivalent PF  0.5 mL Intramuscular Tomorrow-1000  . insulin aspart  0-15 Units Subcutaneous TID WC  . insulin aspart  0-5 Units Subcutaneous QHS   Continuous  Infusions: PRN Meds:.acetaminophen **OR** acetaminophen, ondansetron (ZOFRAN) IV  Micro Results No results found for this or any previous visit (from the past 240 hour(s)).  Radiology Reports No results found.  Time Spent in minutes  25   Rexanna Louthan M.D on 10/29/2017 at 3:00 PM  Between 7am to 7pm - Pager - [phone]  After 7pm go to [email or web address] - password [facility]  [facility] -  Office  [phone]

## 2017-10-29 NOTE — Clinical Social Work Note (Signed)
Clinical Social Work Assessment  Patient Details  Name: Shelby Brown MRN: [id number] Date of Birth: 09/12/1981  Date of referral:  10/29/17               Reason for consult:  Facility Placement                Permission sought to share information with:  Case Manager, Customer service manager, Psychiatrist Permission granted to share information::  Yes, Verbal Permission Granted  Name::        Agency::     Relationship::     Contact Information:     Housing/Transportation Living arrangements for the past 2 months:  Single Family Home Source of Information:  Patient Patient Interpreter Needed:  Spanish Criminal Activity/Legal Involvement Pertinent to Current Situation/Hospitalization:  No - Comment as needed Significant Relationships:  Spouse, Dependent Children Lives with:  Spouse, Minor Children Do you feel safe going back to the place where you live?  Yes Need for family participation in patient care:  Yes (Comment)  Care giving concerns:  Patient reports that she has had trouble sleeping for the past 3 years. She states that this hospitalization was due to trying to sleep. H&P reports patient drank a beer and took multiple metformin pills to sleep after an argument with her husband.    Social Worker assessment / plan: LCSW consulted for inpatient psych placement.  LCSW met at bedside with patient. Patient has a Actuary for safety and a family member was present.   Patient reports that she was not trying to harm herself and that she was trying to get sleep. Patient reports that she has had trouble sleeping for the past 3 years and that she took pills to attempt to get sleep.   Patient reports that she lives at home with her husband and her 3 minor children. Patient reports that she has never been inpatient psych before and that she is not on any psych meds at home. Patient has never been seen by an outpatient therapist or psychiatrist.   Patient reports that she  prefers to go home and followup with outpatient resources. Patient reports that she does not work and is the caregiver for her minor children. Psych is recommending inpatient when medically stable. Per NT and RN attending is consulting psych again to see if patient can be cleared.    PLAN: TBD once patient is seen by psych again.   Employment status:    Insurance information:    PT Recommendations:  Not assessed at this time Information / Referral to community resources:  Inpatient Psychiatric Care (Comment Required)  Patient/Family's Response to care:  Patient would like to go home and is awaiting to see psych again. LCSW explained inpatient psych process and the difference between voluntary commitment and IVC.    Patient/Family's Understanding of and Emotional Response to Diagnosis, Current Treatment, and Prognosis:  Patient is understanding of current diagnosis. Patient does not agree with inpatient psych placement and reports that she was not trying to harm herself. Patient would prefer to use outpatient resources. Patient awaiting psych follow up to determine treatment plan.   Emotional Assessment Appearance:  Appears older than stated age Attitude/Demeanor/Rapport:    Affect (typically observed):  Calm, Pleasant Orientation:  Oriented to Self, Oriented to Place, Oriented to  Time, Oriented to Situation Alcohol / Substance use:  Alcohol Use Psych involvement (Current and /or in the community):  No (Comment)  Discharge Needs  Concerns to be addressed:  Financial /  Insurance Concerns Readmission within the last 30 days:  No Current discharge risk:  None Barriers to Discharge:  Psych Bed not available, Continued Medical Work up, Inadequate or no insurance   [place], LCSW 10/29/2017, 10:55 AM

## 2017-10-29 NOTE — Care Management Note (Signed)
Case Management Note  Patient Details  Name: Ardyth Galvelia Campos-Ramirez MRN: [id number] Date of Birth: [birth year]  Subjective/Objective:                  Overdose   Action/Plan: Date:  October 29, 2017 Chart reviewed for concurrent status and case management needs.  Will continue to follow patient progress.  Discharge Planning: following for needs.  None present at this time of review. Expected discharge date: November 01, 2017 Marcelle SmilingRhonda Davis, BSN, [place] Terr[place]RN3, [place]   [phone]   Expected Discharge Date:                  Expected Discharge Plan:  Home/Self Care  In-House Referral:  Clinical Social Work  Discharge planning Services  CM Consult  Post Acute Care Choice:    Choice offered to:     DME Arranged:    DME Agency:     HH Arranged:    HH Agency:     Status of Service:  In process, will continue to follow  If discussed at Long Length of Stay Meetings, dates discussed:    Additional Comments:  Golda AcreDavis, Rhonda Lynn, RN 10/29/2017, 9:47 AM

## 2017-10-29 NOTE — Progress Notes (Signed)
Patient requested to take a shower. Paged MD. Awaiting respond

## 2017-10-30 DIAGNOSIS — Z79899 Other long term (current) drug therapy: Secondary | ICD-10-CM

## 2017-10-30 LAB — GLUCOSE, CAPILLARY
GLUCOSE-CAPILLARY: 146 mg/dL — AB (ref 65–99)
GLUCOSE-CAPILLARY: 192 mg/dL — AB (ref 65–99)
Glucose-Capillary: 164 mg/dL — ABNORMAL HIGH (ref 65–99)
Glucose-Capillary: 226 mg/dL — ABNORMAL HIGH (ref 65–99)

## 2017-10-30 MED ORDER — SALINE SPRAY 0.65 % NA SOLN
1.0000 | NASAL | Status: DC | PRN
  Filled 2017-10-30: qty 44

## 2017-10-30 MED ORDER — DOCUSATE SODIUM 100 MG PO CAPS
100.0000 mg | ORAL_CAPSULE | Freq: Two times a day (BID) | ORAL | Status: DC
  Administered 2017-10-30 – 2017-10-31 (×2): 100 mg via ORAL
  Filled 2017-10-30 (×2): qty 1

## 2017-10-30 NOTE — Progress Notes (Addendum)
Inpatient Diabetes Program Recommendations  AACE/ADA: New Consensus Statement on Inpatient Glycemic Control (2015)  Target Ranges:  Prepandial:   less than 140 mg/dL      Peak postprandial:   less than 180 mg/dL (1-2 hours)      Critically ill patients:  140 - 180 mg/dL   Lab Results  Component Value Date   GLUCAP 226 (H) 10/30/2017   HGBA1C 13.5 (H) 10/27/2017    Review of Glycemic Control  Diabetes history: DM2 Outpatient Diabetes medications: Amaryl 2 mg QAM, metformin 1000 mg bid, Novolin 70/30 12 units bid Current orders for Inpatient glycemic control: Lantus 6 units QHS, Novolog 0-15 units tidwc and hs  Overdosed on metformin intentionally following argument with husband.   Inpatient Diabetes Program Recommendations:     Add Novolog 4 units tidwc for meal coverage insulin if pt eats > 50% meal. Titrate Lantus if FBS > 180 mg/dL.  Will follow closely.  Thank you. Shelby Brown, RD, LDN, CDE Inpatient Diabetes Coordinator [phone]

## 2017-10-30 NOTE — Consult Note (Signed)
Shelby Brown  10/30/2017 1:12 PM Shelby Brown  MRN:  [id number] Subjective:   Shelby Brown was seen by the psychiatry consult service on 2/9 for suicide risk assessment. She reported intentionally overdosing on Metformin following an argument with her husband. She reported crying spells, feeling hopeless, anxious and suicidal. She reported overdosing 6 months prior. Celexa 10 mg daily was started for depression.   On interview, Shelby Brown now denies that her overdose was a suicide attempt. She reports having an argument with her husband but she did not take the Metformin (#7) until 2 days later because she was tired and wanted to sleep. She reports that she has migraines that cause her to sleep poorly. She denies prior suicide attempts although she reported a history of suicide attempt 6 months ago when initially seen by psychiatry on 2/9. She reports that she would not harm herself because her daughters are protective factors. She denies current SI, HI or AVH.   On interview,  Principal Problem: Adjustment disorder with mixed anxiety and depressed mood Diagnosis:   Patient Active Problem List   Diagnosis Date Noted  . Intentional metformin overdose ([place]) [T38.3X2A] 10/27/2017  . Adjustment disorder with mixed anxiety and depressed mood [F43.23] 10/27/2017  . Financial difficulties [Z59.8] 01/06/2014  . Cough [R05] 01/06/2014  . DYSLIPIDEMIA [E78.5] 12/10/2007  . Diabetes type 2, uncontrolled ([place]) [E11.65] 08/07/2006   Total Time spent with patient: 15 minutes  Past Psychiatric History: Denies  Past Medical History:  Past Medical History:  Diagnosis Date  . Diabetes mellitus without complication [facility])     Past Surgical History:  Procedure Laterality Date  . CESAREAN SECTION    . TUBAL LIGATION     Family History:  Family History  Problem Relation Age of Onset  . Cancer Father   . Diabetes Sister   . Diabetes Sister    Family  Psychiatric  History: Unknown Social History:  Social History   Substance and Sexual Activity  Alcohol Use No     Social History   Substance and Sexual Activity  Drug Use No    Social History   Socioeconomic History  . Marital status: Married    Spouse name: None  . Number of children: None  . Years of education: None  . Highest education level: None  Social Needs  . Financial resource strain: None  . Food insecurity - worry: None  . Food insecurity - inability: None  . Transportation needs - medical: None  . Transportation needs - non-medical: None  Occupational History  . None  Tobacco Use  . Smoking status: Never Smoker  . Smokeless tobacco: Never Used  Substance and Sexual Activity  . Alcohol use: No  . Drug use: No  . Sexual activity: None  Other Topics Concern  . None  Social History Narrative  . None    Sleep: Good  Appetite:  Good  Current Medications: Current Facility-Administered Medications  Medication Dose Route Frequency Provider Last Rate Last Dose  . acetaminophen (TYLENOL) tablet 650 mg  650 mg Oral Q6H PRN Patrecia Pour, MD   650 mg at 10/29/17 1309   Or  . acetaminophen (TYLENOL) suppository 650 mg  650 mg Rectal Q6H PRN Patrecia Pour, MD      . citalopram (CELEXA) tablet 10 mg  10 mg Oral Daily Corena Pilgrim, MD   10 mg at 10/30/17 2330  . enoxaparin (LOVENOX) injection 40 mg  40 mg Subcutaneous Q24H Vance Gather  B, MD   40 mg at 10/29/17 1957  . insulin aspart (novoLOG) injection 0-15 Units  0-15 Units Subcutaneous TID WC Patrecia Pour, MD   5 Units at 10/30/17 1214  . insulin aspart (novoLOG) injection 0-5 Units  0-5 Units Subcutaneous QHS Patrecia Pour, MD   2 Units at 10/29/17 2234  . insulin glargine (LANTUS) injection 6 Units  6 Units Subcutaneous QHS Dhungel, Nishant, MD   6 Units at 10/29/17 2233  . ondansetron (ZOFRAN) injection 4 mg  4 mg Intravenous Q6H PRN Neila Gear, NP   4 mg at 10/29/17 9528    Lab Results:  Results  for orders placed or performed during the hospital encounter of 10/27/17 (from the past 48 hour(s))  Glucose, capillary     Status: Abnormal   Collection Time: 10/28/17  5:09 PM  Result Value Ref Range   Glucose-Capillary 219 (H) 65 - 99 mg/dL   Comment 1 Notify RN   Glucose, capillary     Status: Abnormal   Collection Time: 10/28/17  9:33 PM  Result Value Ref Range   Glucose-Capillary 154 (H) 65 - 99 mg/dL  CBC     Status: Abnormal   Collection Time: 10/29/17  5:58 AM  Result Value Ref Range   WBC 6.0 4.0 - 10.5 K/uL   RBC 3.52 (L) 3.87 - 5.11 MIL/uL   Hemoglobin 10.6 (L) 12.0 - 15.0 g/dL   HCT 31.8 (L) 36.0 - 46.0 %   MCV 90.3 78.0 - 100.0 fL   MCH 30.1 26.0 - 34.0 pg   MCHC 33.3 30.0 - 36.0 g/dL   RDW 13.5 11.5 - 15.5 %   Platelets 281 150 - 400 K/uL    Comment: Performed at [facility], [place] [address] [place], [place] [address]  Comprehensive metabolic panel     Status: Abnormal   Collection Time: 10/29/17  5:58 AM  Result Value Ref Range   Sodium 139 135 - 145 mmol/L   Potassium 3.9 3.5 - 5.1 mmol/L   Chloride 107 101 - 111 mmol/L   CO2 23 22 - 32 mmol/L   Glucose, Bld 206 (H) 65 - 99 mg/dL   BUN 10 6 - 20 mg/dL   Creatinine, Ser 0.50 0.44 - 1.00 mg/dL   Calcium 8.3 (L) 8.9 - 10.3 mg/dL   Total Protein 6.0 (L) 6.5 - 8.1 g/dL   Albumin 3.0 (L) 3.5 - 5.0 g/dL   AST 24 15 - 41 U/L   ALT 20 14 - 54 U/L   Alkaline Phosphatase 51 38 - 126 U/L   Total Bilirubin 0.4 0.3 - 1.2 mg/dL   GFR calc non Af Amer >60 >60 mL/min   GFR calc Af Amer >60 >60 mL/min    Comment: (Brown) The eGFR has been calculated using the CKD EPI equation. This calculation has not been validated in all clinical situations. eGFR's persistently <60 mL/min signify possible Chronic Kidney Disease.    Anion gap 9 5 - 15    Comment: Performed at [facility], [place] [address], [place], [place] [address]  Magnesium     Status: None   Collection Time: 10/29/17  5:58 AM   Result Value Ref Range   Magnesium 1.9 1.7 - 2.4 mg/dL    Comment: Performed at [facility], [place] [address] [place], [place] [address]  Lactic acid, plasma     Status: None   Collection Time: 10/29/17  5:58 AM  Result Value Ref Range   Lactic Acid, Venous 0.8 0.5 - 1.9 mmol/L    Comment: Performed at [facility], [place] [address] [place], [place] [address]  Glucose, capillary     Status: Abnormal   Collection Time: 10/29/17  7:57 AM  Result Value Ref Range   Glucose-Capillary 209 (H) 65 - 99 mg/dL   Comment 1 Notify RN    Comment 2 Document in Chart   Glucose, capillary     Status: Abnormal   Collection Time: 10/29/17  1:01 PM  Result Value Ref Range   Glucose-Capillary 200 (H) 65 - 99 mg/dL  Glucose, capillary     Status: Abnormal   Collection Time: 10/29/17  5:20 PM  Result Value Ref Range   Glucose-Capillary 248 (H) 65 - 99 mg/dL   Comment 1 Notify RN    Comment 2 Document in Chart   Glucose, capillary     Status: Abnormal   Collection Time: 10/29/17 10:26 PM  Result Value Ref Range   Glucose-Capillary 238 (H) 65 - 99 mg/dL  Glucose, capillary     Status: Abnormal   Collection Time: 10/30/17  7:37 AM  Result Value Ref Range   Glucose-Capillary 164 (H) 65 - 99 mg/dL  Glucose, capillary     Status: Abnormal   Collection Time: 10/30/17 11:31 AM  Result Value Ref Range   Glucose-Capillary 226 (H) 65 - 99 mg/dL    Blood Alcohol level:  Lab Results  Component Value Date   ETH 109 (H) 10/27/2017   ETH 159 (H) 03/09/2013    Musculoskeletal: Strength & Muscle Tone: within normal limits Gait & Station: UTA since patient was lying in bed. Patient leans: N/A  Psychiatric Specialty Exam: Physical Exam  Nursing Brown and vitals reviewed. Constitutional: She is oriented to person, place, and time. She appears well-developed and well-nourished.  HENT:  Head: Normocephalic and atraumatic.  Neck: Normal range of motion.  Respiratory:  Effort normal.  Musculoskeletal: Normal range of motion.  Neurological: She is alert and oriented to person, place, and time.  Skin: No rash noted.  Psychiatric: She has a normal mood and affect. Her speech is normal and behavior is normal. Thought content normal. Cognition and memory are normal. She expresses impulsivity.    Review of Systems  Constitutional: Negative for chills and fever.  Eyes: Positive for pain (bilateral).  Cardiovascular: Negative for chest pain.  Gastrointestinal: Positive for constipation. Negative for abdominal pain, diarrhea, nausea and vomiting.  Neurological: Positive for headaches.  Psychiatric/Behavioral: Negative for depression, hallucinations, substance abuse and suicidal ideas. The patient is not nervous/anxious and does not have insomnia.   All other systems reviewed and are negative.   Blood pressure 118/78, pulse 79, temperature 98.9 F (37.2 C), temperature source Oral, resp. rate 18, SpO2 98 %.There is no height or weight on file to calculate BMI.  General Appearance: Well Groomed, young, Hispanic female with long hair in a ponytail and wearing paper hospital scrubs and lying in bed. NAD.   Eye Contact:  Good  Speech:  Clear and Coherent and Normal Rate  Volume:  Normal  Mood:  Euthymic  Affect:  Constricted and irritable at times.   Thought Process:  Goal Directed, Linear and Descriptions of Associations: Intact  Orientation:  Full (Time, Place, and Person)  Thought Content:  Logical  Suicidal Thoughts:  No  Homicidal Thoughts:  No  Memory:  Immediate;   Good Recent;   Good Remote;   Good  Judgement:  Poor  Insight:  Poor  Psychomotor Activity:  Normal  Concentration:  Concentration: Good and Attention Span: Good  Recall:  Good  Fund of Knowledge:  Good  Language:  Good  Akathisia:  No  Handed:  Right  AIMS (if indicated):   N/A  Assets:  Financial Resources/Insurance Housing Social Support  ADL's:  Intact  Cognition:  WNL  Sleep:    Okay   Assessment:  Shelby Brown is a 37 y.o. female who was admitted after a suicide attempt by Metformin overdose. She denies current SI but minimizes her overdose and previous endorsed symptoms of depression. Her attempt appears to have been impulsive following an argument with her husband. She is mostl likely presenting with an adjustment disorder. She has a history of a prior suicide attempt 6 months ago and has ongoing stressors that further increase her risk for reattempting. She warrants inpatient psychiatric hospitalization for stabilization and treatment.    Treatment Plan Summary: -Patient warrants inpatient psychiatric hospitalization given high risk of harm to self. -Continue bedside sitter.  -Continue Celexa 10 mg daily for depression.  -Please pursue involuntary commitment if patient refuses voluntary psychiatric hospitalization or attempts to leave the hospital.  -Will sign off on patient at this time. Please consult psychiatry again as needed.     Faythe Dingwall, DO 10/30/2017, 1:12 PM

## 2017-10-30 NOTE — Progress Notes (Signed)
PROGRESS NOTE  Shelby Brown  NWG:[id number] DOB: 10/24/1980 DOA: 10/27/2017 PCP: Patient, No Pcp Per  Brief Narrative:  37 year old female with history of type 2 diabetes mellitus admitted after ingesting several metformin pills the following what appears to be a suicidal attempt.  She had elevated lactate on admission which resolved subsequently.  Medically stable.  Seen by psychiatry and awaiting inpatient psychiatric admission.  Prior suicide attempt 6 months ago and ongoing stressors.    Assessment & Plan:    Adjustment disorder with mixed anxiety and depressed mood with intentional metformin overdose (HCC) -  Awaiting inpatient psychiatry admission -  Involuntarily committed -  Sitter at bedside -  Continue celexa  Diabetes type 2, uncontrolled (HCC), A1c 13.    -  Continue lantus 6 u -  Continue mod dose SSI -  Reports getting medications through [facility].   -  Does not have a PCP.  Constipation -  Start colace  DVT prophylaxis:  lovenox Code Status:  full Family Communication:  Patient alone Disposition Plan:  Medically stable, [facility]   Consultants:   Psychiatry  Procedures:  none  Antimicrobials:  Anti-infectives (From admission, onward)   None       Subjective:  Patient states that she did not intend to kill herself when she took her metformin.  She took the Metformin so that she could sleep.  She pleaded with me to discharge her because she does not have anyone to care for her children pick her daughter up from school.  She then tells me that she is constipated and that "always happens " when she takes charcoal for overdose.    Objective: Vitals:   10/29/17 1400 10/29/17 2120 10/30/17 0632 10/30/17 1500  BP: (!) 93/58 126/79 118/78 120/72  Pulse: 96 87 79 85  Resp: 18 18 18 18   Temp: 98.1 F (36.7 C) 99.1 F (37.3 C) 98.9 F (37.2 C) 98.8 F (37.1 C)  TempSrc: Oral Oral Oral Oral  SpO2: 98% 98% 98% 97%   No  intake or output data in the 24 hours ending 10/30/17 1833 There were no vitals filed for this visit.  Examination:  General exam:  Adult female.  No acute distress.  HEENT:  NCAT, MMM Respiratory system: Clear to auscultation bilaterally Cardiovascular system: Regular rate and rhythm, normal S1/S2. No murmurs, rubs, gallops or clicks.  Warm extremities Gastrointestinal system: Normal active bowel sounds, soft, nondistended, nontender. MSK:  Normal tone and bulk, no lower extremity edema Neuro:  Grossly intact    Data Reviewed: I have personally reviewed following labs and imaging studies  CBC: Recent Labs  Lab 10/27/17 0318 10/29/17 0558  WBC 7.6 6.0  HGB 12.9 10.6*  HCT 38.4 31.8*  MCV 87.5 90.3  PLT 366 281   Basic Metabolic Panel: Recent Labs  Lab 10/27/17 1421 10/27/17 1856 10/28/17 0124 10/28/17 0641 10/29/17 0558  NA 137 136 138 136 139  K 3.1* 3.6 4.0 4.1 3.9  CL 107 108 108 107 107  CO2 26 25 23 24 23   GLUCOSE 165* 243* 302* 242* 206*  BUN 5* 7 8 7 10   CREATININE 0.48 0.52 0.55 0.46 0.50  CALCIUM 8.2* 8.3* 8.4* 7.9* 8.3*  MG  --   --   --   --  1.9   GFR: CrCl cannot be calculated (Unknown ideal weight.). Liver Function Tests: Recent Labs  Lab 10/27/17 0318 10/29/17 0558  AST 25 24  ALT 27 20  ALKPHOS  66 51  BILITOT 0.4 0.4  PROT 7.8 6.0*  ALBUMIN 4.0 3.0*   No results for input(s): LIPASE, AMYLASE in the last 168 hours. No results for input(s): AMMONIA in the last 168 hours. Coagulation Profile: No results for input(s): INR, PROTIME in the last 168 hours. Cardiac Enzymes: No results for input(s): CKTOTAL, CKMB, CKMBINDEX, TROPONINI in the last 168 hours. BNP (last 3 results) No results for input(s): PROBNP in the last 8760 hours. HbA1C: No results for input(s): HGBA1C in the last 72 hours. CBG: Recent Labs  Lab 10/29/17 1720 10/29/17 2226 10/30/17 0737 10/30/17 1131 10/30/17 1554  GLUCAP 248* 238* 164* 226* 146*   Lipid  Profile: No results for input(s): CHOL, HDL, LDLCALC, TRIG, CHOLHDL, LDLDIRECT in the last 72 hours. Thyroid Function Tests: No results for input(s): TSH, T4TOTAL, FREET4, T3FREE, THYROIDAB in the last 72 hours. Anemia Panel: No results for input(s): VITAMINB12, FOLATE, FERRITIN, TIBC, IRON, RETICCTPCT in the last 72 hours. Urine analysis:    Component Value Date/Time   COLORURINE AMBER (A) 11/28/2014 1725   APPEARANCEUR CLEAR 11/28/2014 1725   LABSPEC 1.035 (H) 11/28/2014 1725   PHURINE 5.5 11/28/2014 1725   GLUCOSEU >1000 (A) 11/28/2014 1725   GLUCOSEU > 1000 mg/dL (A) 16/06/9603 5409   HGBUR NEGATIVE 11/28/2014 1725   BILIRUBINUR small (A) 08/06/2017 1044   KETONESUR >= (160) (A) 08/06/2017 1044   KETONESUR >80 (A) 11/28/2014 1725   PROTEINUR negative 08/06/2017 1044   PROTEINUR 30 (A) 11/28/2014 1725   UROBILINOGEN 0.2 08/06/2017 1044   UROBILINOGEN 0.2 11/28/2014 1725   NITRITE Negative 08/06/2017 1044   NITRITE NEGATIVE 11/28/2014 1725   LEUKOCYTESUR Negative 08/06/2017 1044   Sepsis Labs: @LABRCNTIP (procalcitonin:4,lacticidven:4)  )No results found for this or any previous visit (from the past 240 hour(s)).    Radiology Studies: No results found.   Scheduled Meds: . citalopram  10 mg Oral Daily  . enoxaparin (LOVENOX) injection  40 mg Subcutaneous Q24H  . insulin aspart  0-15 Units Subcutaneous TID WC  . insulin aspart  0-5 Units Subcutaneous QHS  . insulin glargine  6 Units Subcutaneous QHS   Continuous Infusions:   LOS: 1 day    Time spent: 30 min    Renae Fickle, MD [facility] Pager [phone]  If 7PM-7AM, please contact night-coverage [email or web address] Password [facility] 10/30/2017, 6:33 PM

## 2017-10-30 NOTE — Progress Notes (Signed)
LCSW following for inpatient psych placement.   Patient is medically stable for dc.  Patient needs updated psych not for facility consideration. LCSW notified psych.   LCSW will continue to follow.   Beulah GandyBernette Macaela Presas, LSCW [facility] CSW [phone]

## 2017-10-31 DIAGNOSIS — T383X4A Poisoning by insulin and oral hypoglycemic [antidiabetic] drugs, undetermined, initial encounter: Secondary | ICD-10-CM

## 2017-10-31 LAB — GLUCOSE, CAPILLARY
GLUCOSE-CAPILLARY: 197 mg/dL — AB (ref 65–99)
GLUCOSE-CAPILLARY: 245 mg/dL — AB (ref 65–99)
Glucose-Capillary: 116 mg/dL — ABNORMAL HIGH (ref 65–99)
Glucose-Capillary: 195 mg/dL — ABNORMAL HIGH (ref 65–99)

## 2017-10-31 MED ORDER — FAMOTIDINE 20 MG PO TABS
20.0000 mg | ORAL_TABLET | Freq: Two times a day (BID) | ORAL | Status: DC
  Administered 2017-10-31 – 2017-11-01 (×3): 20 mg via ORAL
  Filled 2017-10-31 (×3): qty 1

## 2017-10-31 MED ORDER — INSULIN ASPART 100 UNIT/ML ~~LOC~~ SOLN: SUBCUTANEOUS | 11 refills | Status: DC

## 2017-10-31 MED ORDER — METOCLOPRAMIDE HCL 5 MG/ML IJ SOLN
10.0000 mg | Freq: Once | INTRAMUSCULAR | Status: AC
  Administered 2017-10-31: 10 mg via INTRAVENOUS
  Filled 2017-10-31: qty 2

## 2017-10-31 MED ORDER — CITALOPRAM HYDROBROMIDE 10 MG PO TABS: 10.0000 mg | ORAL_TABLET | Freq: Every day | ORAL | Status: DC

## 2017-10-31 MED ORDER — DIPHENHYDRAMINE HCL 50 MG/ML IJ SOLN
25.0000 mg | Freq: Once | INTRAMUSCULAR | Status: AC
  Administered 2017-10-31: 25 mg via INTRAVENOUS
  Filled 2017-10-31: qty 1

## 2017-10-31 MED ORDER — INSULIN GLARGINE 100 UNIT/ML ~~LOC~~ SOLN: 6.0000 [IU] | Freq: Every day | SUBCUTANEOUS | 11 refills | Status: DC

## 2017-10-31 MED ORDER — KETOROLAC TROMETHAMINE 30 MG/ML IJ SOLN
30.0000 mg | Freq: Once | INTRAMUSCULAR | Status: AC
  Administered 2017-10-31: 30 mg via INTRAVENOUS
  Filled 2017-10-31: qty 1

## 2017-10-31 MED ORDER — POLYETHYLENE GLYCOL 3350 17 G PO PACK
17.0000 g | PACK | Freq: Once | ORAL | Status: AC
  Administered 2017-10-31: 17 g via ORAL
  Filled 2017-10-31: qty 1

## 2017-10-31 NOTE — Discharge Summary (Signed)
PATIENT DETAILS Name: Shelby Brown Age: 37 y.o. Sex: female Date of Birth: April 28, 1981 MRN: [id number]. Admitting Physician: Tyrone Nineyan B Grunz, MD WUJ:[id number]:Patient, No Pcp Per  Admit Date: 10/27/2017 Discharge date: 10/31/2017  Recommendations for Outpatient Follow-up:  1. Follow up with PCP in 1-2 weeks 2. Please obtain BMP/CBC in one week   Admitted From:  Home  Disposition: Inpatient psychiatry   Home Health: No  Equipment/Devices: None  Discharge Condition: Stable  CODE STATUS: FULL CODE  Diet recommendation:  Carb Modified   Brief Summary: See H&P, Labs, Consult and Test reports for all details in brief, 37 year old female with history of type 2 diabetes mellitus admitted after ingesting several metformin pills the following what appears to be a suicidal attempt. She had elevated lactate on admission which resolved subsequently.  Medically stable.  Seen by psychiatry and awaiting inpatient psychiatric admission.  Prior suicide attempt 6 months ago and ongoing stressors  Brief Hospital Course: Suicidal attempt with intentional metformin overdose: Seen by psychiatry-continue sitter at bedside-stable for discharge to inpatient psychiatry.  Depression: Continue Celexa-see above.  DM-2: A1c significantly elevated at 13-continue low-dose Lantus, SSI and Amaryl.  Follow-up with PCP for further optimization  Procedures/Studies: None  Discharge Diagnoses:  Principal Problem:   Adjustment disorder with mixed anxiety and depressed mood Active Problems:   Diabetes type 2, uncontrolled (HCC)   Intentional metformin overdose [facility](HCC)   Discharge Instructions:  Activity:  As tolerated    Discharge Instructions    Diet general   Complete by:  As directed    Increase activity slowly   Complete by:  As directed      Allergies as of 10/31/2017   No Known Allergies     Medication List    STOP taking these medications   insulin NPH-regular Human (70-30) 100  UNIT/ML injection Commonly known as:  NOVOLIN 70/30   metFORMIN 500 MG tablet Commonly known as:  GLUCOPHAGE     TAKE these medications   citalopram 10 MG tablet Commonly known as:  CELEXA Take 1 tablet (10 mg total) by mouth daily. Start taking on:  11/01/2017   glimepiride 2 MG tablet Commonly known as:  AMARYL Take 1 tablet (2 mg total) daily before breakfast by mouth.   glucose blood test strip Use to check blood sugar four (4) times per day, before meals and at bedtime   insulin aspart 100 UNIT/ML injection Commonly known as:  novoLOG 0-15 Units, Subcutaneous, 3 times daily with meals CBG < 70: implement hypoglycemia protocol CBG 70 - 120: 0 units CBG 121 - 150: 2 units CBG 151 - 200: 3 units CBG 201 - 250: 5 units CBG 251 - 300: 8 units CBG 301 - 350: 11 units CBG 351 - 400: 15 units CBG > 400: call MD and obtain STAT lab verification   insulin glargine 100 UNIT/ML injection Commonly known as:  LANTUS Inject 0.06 mLs (6 Units total) into the skin at bedtime.   Insulin Syringe-Needle U-100 31G X 15/64" 0.3 ML Misc Use twice daily to administer insulin   RELION PRIME MONITOR Devi Please use device to check blood sugars four (4) times per day, before meals and at bedtime.   RELION ULTRA THIN LANCETS 30G Misc Use to check blood sugars four (4) times per day, before meals and at bedtime       No Known Allergies  Consultations:   psychiatry   Other Procedures/Studies:  No results found.   TODAY-DAY OF DISCHARGE:  Subjective:  Konner Brown today has no chest abdominal pain,no new weakness tingling or numbness, feels much better wants to go home today.  Objective:   Blood pressure 112/76, pulse 87, temperature 100.2 F (37.9 C), temperature source Oral, resp. rate 17, SpO2 100 %. No intake or output data in the 24 hours ending 10/31/17 1041 There were no vitals filed for this visit.  Exam: Awake Alert, Oriented *3, No new F.N deficits,  Normal affect [place].AT,PERRAL Supple Neck,No JVD, No cervical lymphadenopathy appriciated.  Symmetrical Chest wall movement, Good air movement bilaterally, CTAB RRR,No Gallops,Rubs or new Murmurs, No Parasternal Heave +ve B.Sounds, Abd Soft, Non tender, No organomegaly appriciated, No rebound -guarding or rigidity. No Cyanosis, Clubbing or edema, No new Rash or bruise   PERTINENT RADIOLOGIC STUDIES: No results found.   PERTINENT LAB RESULTS: CBC: Recent Labs    10/29/17 0558  WBC 6.0  HGB 10.6*  HCT 31.8*  PLT 281   CMET CMP     Component Value Date/Time   NA 139 10/29/2017 0558   NA 139 08/06/2017 1113   K 3.9 10/29/2017 0558   CL 107 10/29/2017 0558   CO2 23 10/29/2017 0558   GLUCOSE 206 (H) 10/29/2017 0558   BUN 10 10/29/2017 0558   BUN 13 08/06/2017 1113   CREATININE 0.50 10/29/2017 0558   CREATININE 0.62 01/06/2014 1522   CALCIUM 8.3 (L) 10/29/2017 0558   PROT 6.0 (L) 10/29/2017 0558   PROT 7.4 08/06/2017 1113   ALBUMIN 3.0 (L) 10/29/2017 0558   ALBUMIN 4.5 08/06/2017 1113   AST 24 10/29/2017 0558   ALT 20 10/29/2017 0558   ALKPHOS 51 10/29/2017 0558   BILITOT 0.4 10/29/2017 0558   BILITOT 0.6 08/06/2017 1113   GFRNONAA >60 10/29/2017 0558   GFRNONAA >89 01/06/2014 1522   GFRAA >60 10/29/2017 0558   GFRAA >89 01/06/2014 1522    GFR CrCl cannot be calculated (Unknown ideal weight.). No results for input(s): LIPASE, AMYLASE in the last 72 hours. No results for input(s): CKTOTAL, CKMB, CKMBINDEX, TROPONINI in the last 72 hours. Invalid input(s): POCBNP No results for input(s): DDIMER in the last 72 hours. No results for input(s): HGBA1C in the last 72 hours. No results for input(s): CHOL, HDL, LDLCALC, TRIG, CHOLHDL, LDLDIRECT in the last 72 hours. No results for input(s): TSH, T4TOTAL, T3FREE, THYROIDAB in the last 72 hours.  Invalid input(s): FREET3 No results for input(s): VITAMINB12, FOLATE, FERRITIN, TIBC, IRON, RETICCTPCT in the last 72  hours. Coags: No results for input(s): INR in the last 72 hours.  Invalid input(s): PT Microbiology: No results found for this or any previous visit (from the past 240 hour(s)).  FURTHER DISCHARGE INSTRUCTIONS:  Get Medicines reviewed and adjusted: Please take all your medications with you for your next visit with your Primary MD  Laboratory/radiological data: Please request your Primary MD to go over all hospital tests and procedure/radiological results at the follow up, please ask your Primary MD to get all Hospital records sent to his/her office.  In some cases, they will be blood work, cultures and biopsy results pending at the time of your discharge. Please request that your primary care M.D. goes through all the records of your hospital data and follows up on these results.  Also Note the following: If you experience worsening of your admission symptoms, develop shortness of breath, life threatening emergency, suicidal or homicidal thoughts you must seek medical attention immediately by calling 911 or calling your MD immediately  if symptoms less severe.  You must read complete instructions/literature along with all the possible adverse reactions/side effects for all the Medicines you take and that have been prescribed to you. Take any new Medicines after you have completely understood and accpet all the possible adverse reactions/side effects.   Do not drive when taking Pain medications or sleeping medications (Benzodaizepines)  Do not take more than prescribed Pain, Sleep and Anxiety Medications. It is not advisable to combine anxiety,sleep and pain medications without talking with your primary care practitioner  Special Instructions: If you have smoked or chewed Tobacco  in the last 2 yrs please stop smoking, stop any regular Alcohol  and or any Recreational drug use.  Wear Seat belts while driving.  Please note: You were cared for by a hospitalist during your hospital stay.  Once you are discharged, your primary care physician will handle any further medical issues. Please note that NO REFILLS for any discharge medications will be authorized once you are discharged, as it is imperative that you return to your primary care physician (or establish a relationship with a primary care physician if you do not have one) for your post hospital discharge needs so that they can reassess your need for medications and monitor your lab values.  Total Time spent coordinating discharge including counseling, education and face to face time equals 25  minutes.  SignedJeoffrey Massed 10/31/2017 10:41 AM

## 2017-10-31 NOTE — Progress Notes (Signed)
LCSW following for inpatient psych placement.  [facility].   Patients family does not have a car and daughter is her support and has been visiting her. Patient does not speak [place] and needs a Nurse, learning disabilitytranslator.   LCSW will continue to follow for placement.   Shelby Brown, LSCW [facility] CSW [phone]

## 2017-11-01 ENCOUNTER — Inpatient Hospital Stay (HOSPITAL_COMMUNITY)
Admission: AD | Admit: 2017-11-01 | Discharge: 2017-11-04 | DRG: 885 | Disposition: A | Discharge status: Home / Self Care | Payer: Federal, State, Local not specified - Other | Source: Intra-hospital | Attending: Psychiatry | Admitting: Psychiatry

## 2017-11-01 ENCOUNTER — Encounter (HOSPITAL_COMMUNITY): Payer: Self-pay | Admitting: *Deleted

## 2017-11-01 ENCOUNTER — Other Ambulatory Visit: Payer: Self-pay

## 2017-11-01 DIAGNOSIS — T50901A Poisoning by unspecified drugs, medicaments and biological substances, accidental (unintentional), initial encounter: Secondary | ICD-10-CM | POA: Diagnosis present

## 2017-11-01 DIAGNOSIS — F332 Major depressive disorder, recurrent severe without psychotic features: Principal | ICD-10-CM | POA: Diagnosis present

## 2017-11-01 DIAGNOSIS — E119 Type 2 diabetes mellitus without complications: Secondary | ICD-10-CM | POA: Diagnosis present

## 2017-11-01 DIAGNOSIS — Z794 Long term (current) use of insulin: Secondary | ICD-10-CM | POA: Diagnosis not present

## 2017-11-01 DIAGNOSIS — G4700 Insomnia, unspecified: Secondary | ICD-10-CM | POA: Diagnosis not present

## 2017-11-01 DIAGNOSIS — Z79899 Other long term (current) drug therapy: Secondary | ICD-10-CM

## 2017-11-01 DIAGNOSIS — Z833 Family history of diabetes mellitus: Secondary | ICD-10-CM

## 2017-11-01 DIAGNOSIS — F329 Major depressive disorder, single episode, unspecified: Secondary | ICD-10-CM | POA: Diagnosis not present

## 2017-11-01 DIAGNOSIS — T383X1A Poisoning by insulin and oral hypoglycemic [antidiabetic] drugs, accidental (unintentional), initial encounter: Secondary | ICD-10-CM | POA: Diagnosis not present

## 2017-11-01 LAB — GLUCOSE, CAPILLARY
GLUCOSE-CAPILLARY: 151 mg/dL — AB (ref 65–99)
Glucose-Capillary: 222 mg/dL — ABNORMAL HIGH (ref 65–99)
Glucose-Capillary: 282 mg/dL — ABNORMAL HIGH (ref 65–99)
Glucose-Capillary: 148 mg/dL — ABNORMAL HIGH (ref 65–99)

## 2017-11-01 MED ORDER — HYDROXYZINE HCL 25 MG PO TABS
25.0000 mg | ORAL_TABLET | Freq: Three times a day (TID) | ORAL | Status: DC | PRN
  Filled 2017-11-01: qty 1

## 2017-11-01 MED ORDER — TRAZODONE HCL 50 MG PO TABS: 50.0000 mg | ORAL_TABLET | Freq: Every evening | ORAL | Status: DC | PRN

## 2017-11-01 MED ORDER — INSULIN ASPART 100 UNIT/ML ~~LOC~~ SOLN
0.0000 [IU] | Freq: Every day | SUBCUTANEOUS | Status: DC
  Administered 2017-11-02 – 2017-11-03 (×2): 4 [IU] via SUBCUTANEOUS

## 2017-11-01 MED ORDER — ALUM & MAG HYDROXIDE-SIMETH 200-200-20 MG/5ML PO SUSP: 30.0000 mL | ORAL | Status: DC | PRN

## 2017-11-01 MED ORDER — INSULIN GLARGINE 100 UNIT/ML ~~LOC~~ SOLN
6.0000 [IU] | Freq: Every day | SUBCUTANEOUS | Status: DC
  Administered 2017-11-01 – 2017-11-03 (×3): 6 [IU] via SUBCUTANEOUS

## 2017-11-01 MED ORDER — CITALOPRAM HYDROBROMIDE 10 MG PO TABS
10.0000 mg | ORAL_TABLET | Freq: Every day | ORAL | Status: DC
  Administered 2017-11-02 – 2017-11-03 (×2): 10 mg via ORAL
  Filled 2017-11-01 (×4): qty 1

## 2017-11-01 MED ORDER — FAMOTIDINE 20 MG PO TABS
20.0000 mg | ORAL_TABLET | Freq: Two times a day (BID) | ORAL | Status: DC
  Administered 2017-11-02 – 2017-11-04 (×5): 20 mg via ORAL
  Filled 2017-11-01 (×10): qty 1

## 2017-11-01 MED ORDER — SALINE SPRAY 0.65 % NA SOLN: 1.0000 | NASAL | Status: DC | PRN

## 2017-11-01 MED ORDER — MAGNESIUM HYDROXIDE 400 MG/5ML PO SUSP
30.0000 mL | Freq: Every day | ORAL | Status: DC | PRN
  Administered 2017-11-03: 30 mL via ORAL
  Filled 2017-11-01: qty 30

## 2017-11-01 MED ORDER — INSULIN ASPART 100 UNIT/ML ~~LOC~~ SOLN
0.0000 [IU] | Freq: Three times a day (TID) | SUBCUTANEOUS | Status: DC
  Administered 2017-11-02: 5 [IU] via SUBCUTANEOUS
  Administered 2017-11-02 – 2017-11-03 (×3): 8 [IU] via SUBCUTANEOUS
  Administered 2017-11-03: 3 [IU] via SUBCUTANEOUS
  Administered 2017-11-04: 8 [IU] via SUBCUTANEOUS
  Administered 2017-11-04: 5 [IU] via SUBCUTANEOUS

## 2017-11-01 MED ORDER — ACETAMINOPHEN 325 MG PO TABS: 650.0000 mg | ORAL_TABLET | Freq: Four times a day (QID) | ORAL | Status: DC | PRN

## 2017-11-01 NOTE — Progress Notes (Signed)
Patient was seen and examined Spoke with daughter at bedside who speaks [place]  Patient denies any nausea, vomiting, diarrhea, abdominal pain or chest pain.  She is lying comfortably in bed. Please see discharge summary from 2/13-she remains stable for discharge to inpatient psychiatry.

## 2017-11-01 NOTE — Tx Team (Signed)
Initial Treatment Plan 11/01/2017 8:50 PM Shelby GalEvelia Brown NGE:[id number]    PATIENT STRESSORS: Financial difficulties Marital or family conflict   PATIENT STRENGTHS: Ability for insight Average or above average intelligence General fund of knowledge Supportive family/friends   PATIENT IDENTIFIED PROBLEMS: Depression "I wanted to sleep"                     DISCHARGE CRITERIA:  Ability to meet basic life and health needs Improved stabilization in mood, thinking, and/or behavior Reduction of life-threatening or endangering symptoms to within safe limits Verbal commitment to aftercare and medication compliance  PRELIMINARY DISCHARGE PLAN: Attend aftercare/continuing care group Return to previous living arrangement  PATIENT/FAMILY INVOLVEMENT: This treatment plan has been presented to and reviewed with the patient, Shelby Brown, and/or family member, .  The patient and family have been given the opportunity to ask questions and make suggestions.  Jennefer Kopp, [place], [place]RN 11/01/2017, 8:50 PM

## 2017-11-01 NOTE — Progress Notes (Signed)
[facility] at 1715; RN called to give a report at the same time.

## 2017-11-01 NOTE — Progress Notes (Signed)
[facility], AC reported currently no beds available. AC agreed to contact CSW if bed becomes available for patient.  CSW spoke with [facility] staff member Sam, staff agreed to review patient's referral and contact CSW with update regarding bed availability.   CSW will continue to seek inpatient psychiatric placement for patient.  Shelby Brown, [place]CSWA Clinical Social Worker Shelby Brown Cell#: [phone]

## 2017-11-01 NOTE — Progress Notes (Signed)
[facility]1 accepting doctor is Dr. Jama Flavorsobos.  CSW/Hopsital translator Raynelle FanningJulie notified patient [facility] and need for signature for voluntary consent for treatment, patient declined.   CSW updated patient's attending MD. MD IVC'd patient.  CSW faxed patient's IVC paperwork to magistrate, magistrate confirmed receipt.  CSW placed IVC paperwork on patient's chart.   [facility] to [facility] and [facility].  Patient's RN can call report to [phone].   CSW signing off, no other needs identified at this time.  Celso SickleKimberly Verniece Brown, [place]CSWA Clinical Social Worker [facility] Cell#: [phone]

## 2017-11-01 NOTE — Progress Notes (Signed)
Shelby Brown is a 37 year old female pt admitted on involuntary basis. On admission and through interpretor she denied any SI and spoke about how she took the medicine to help her with sleep. She was able to express that her family became concerned when she took the medication and called the ambulance. She reports that she does not have a PCP and reports that she gets her medications from a clinic. She reported that she was doing better now and spoke about how she could leave. She was educated on the process and was able to verbalize understanding. She denied any pain on admission and spoke about how she did not want to take any medications for sleep because she did not want to become addicted to them. She reports that she lives with her husband and 3 daughters and reports that she will return there after discharge. Shelby Brown was oriented to the unit and safety maintained.

## 2017-11-02 LAB — GLUCOSE, CAPILLARY
GLUCOSE-CAPILLARY: 235 mg/dL — AB (ref 65–99)
GLUCOSE-CAPILLARY: 279 mg/dL — AB (ref 65–99)
Glucose-Capillary: 304 mg/dL — ABNORMAL HIGH (ref 65–99)

## 2017-11-02 MED ORDER — TRAZODONE HCL 50 MG PO TABS
50.0000 mg | ORAL_TABLET | Freq: Every day | ORAL | Status: DC
  Administered 2017-11-02: 50 mg via ORAL
  Filled 2017-11-02 (×4): qty 1

## 2017-11-02 NOTE — Progress Notes (Signed)
Patient refused fingerstick and insulin.

## 2017-11-02 NOTE — [facility] Suicide Risk Assessment (Signed)
[facility] Ad[facility] Suicide Risk Assessment   Nursing information obtained from:    Demographic factors:    Current Mental Status:    Loss Factors:    Historical Factors:    Risk Reduction Factors:     Total Time spent with patient: 45 minutes Principal Problem: MDD Diagnosis:   Patient Active Problem List   Diagnosis Date Noted  . MDD (major depressive disorder) [F32.9] 11/01/2017  . Intentional metformin overdose (HCC) [T38.3X2A] 10/27/2017  . Adjustment disorder with mixed anxiety and depressed mood [F43.23] 10/27/2017  . Financial difficulties [Z59.8] 01/06/2014  . Cough [R05] 01/06/2014  . DYSLIPIDEMIA [E78.5] 12/10/2007  . Diabetes type 2, uncontrolled (HCC) [E11.65] 08/07/2006   Subjective Data:  37 y.o Hispanic female, married, lives with her family. Background history of DM. No past history of mental illness. Presented to the ER via emergency services. She overdosed on seven pills of Metformin. Her daughter called 911. Patient was managed medically and stepped down to our unit. Overdose was reported to be in context of an argument with her husband. No substance use. No past suicidal behavior, no family history of suicide, no evidence of psychosis. No evidence of mania. No cognitive impairment. No access to weapons. She is cooperative with care. She has agreed to treatment recommendations. She has agreed to communicate suicidal thoughts to staff if the thoughts becomes overwhelming.      Continued Clinical Symptoms:  Alcohol Use Disorder Identification Test Final Score (AUDIT): 0 The "Alcohol Use Disorders Identification Test", Guidelines for Use in Primary Care, Second Edition.  World Science writerHealth Organization [facility](WHO). Score between 0-7:  no or low risk or alcohol related problems. Score between 8-15:  moderate risk of alcohol related problems. Score between 16-19:  high risk of alcohol related problems. Score 20 or above:  warrants further diagnostic evaluation for alcohol dependence  and treatment.   CLINICAL FACTORS:   Depression:   Impulsivity   Musculoskeletal: Strength & Muscle Tone: within normal limits Gait & Station: normal Patient leans: N/A  Psychiatric Specialty Exam: Physical Exam  ROS  Blood pressure 101/69, pulse 88, temperature 98.2 F (36.8 C), temperature source Oral, resp. rate 18.There is no height or weight on file to calculate BMI.  General Appearance: As in H&P  Eye Contact:    Speech:    Volume:    Mood:    Affect:    Thought Process:    Orientation:    Thought Content:    Suicidal Thoughts:    Homicidal Thoughts:    Memory:    Judgement:    Insight:    Psychomotor Activity:    Concentration:    Recall:  As in H&P  Fund of Knowledge:    Language:    Akathisia:    Handed:    AIMS (if indicated):     Assets:    ADL's:    Cognition:    Sleep:  Number of Hours: 6      COGNITIVE FEATURES THAT CONTRIBUTE TO RISK:  None    SUICIDE RISK:   Minimal: No identifiable suicidal ideation.  Patients presenting with no risk factors but with morbid ruminations; may be classified as minimal risk based on the severity of the depressive symptoms  PLAN OF CARE:  As in H&P  I certify that inpatient services furnished can reasonably be expected to improve the patient's condition.   Georgiann CockerVincent A Hagan Vanauken, MD 11/02/2017, 4:24 PM

## 2017-11-02 NOTE — [facility] Group Notes (Signed)
LCSW Group Therapy Note 11/02/2017 12:56 PM  Type of Therapy and Topic: Group Therapy: Avoiding Self-Sabotaging and Enabling Behaviors  Participation Level: Did Not Attend  Description of Group:  In this group, patients will learn how to identify obstacles, self-sabotaging and enabling behaviors, as well as: what are they, why do we do them and what needs these behaviors meet. Discuss unhealthy relationships and how to have positive healthy boundaries with those that sabotage and enable. Explore aspects of self-sabotage and enabling in yourself and how to limit these self-destructive behaviors in everyday life.  Therapeutic Goals: 1. Patient will identify one obstacle that relates to self-sabotage and enabling behaviors 2. Patient will identify one personal self-sabotaging or enabling behavior they did prior to admission 3. Patient will state a plan to change the above identified behavior 4. Patient will demonstrate ability to communicate their needs through discussion and/or role play.   Summary of Patient Progress:  Invited, chose not to attend. Patient is Spanish-speaking and does not have an interpreter at this time.   Therapeutic Modalities:  Cognitive Behavioral Therapy Person-Centered Therapy Motivational Interviewing   Baldo DaubJolan Kaja Jackowski LCSWA Clinical Social Worker

## 2017-11-02 NOTE — Progress Notes (Signed)
Patient ID: Shelby Brown, female   DOB: [birth year], 37 y.o.   MRN: [id number]  Pt currently presents with a sad affect and guarded behavior. Assessment completed with the aid of a Spanish speaking interpreter. Pt reports to Clinical research associatewriter that their goal is to "go home to my husband and daughters." Pt reports that she is not crazy and was not trying to kill herself. Is having difficulty separating the concepts of self care and insomnia from being "crazy." Pt reports poor sleep currently, refused medications due to worry of dependence. Pt willing to take sleep aid tonight.    Pt provided with medications per providers orders. Pt's labs and vitals were monitored throughout the night. Pt given a 1:1 about emotional and mental status. Pt supported and encouraged to express concerns and questions. Pt educated on medications, healthy sleep hygiene and assertiveness techniques. Needs reinforcement.   Pt's safety ensured with 15 minute and environmental checks. Pt currently denies SI/HI and A/V hallucinations. Pt verbally agrees to seek staff if SI/HI or A/VH occurs and to consult with staff before acting on any harmful thoughts. Will continue POC.

## 2017-11-02 NOTE — [facility] Group Notes (Signed)
Orientation / Goals   Date:  11/02/2017  Time:  9:25 AM  Type of Therapy:  Nurse Education  /  The group focuses on teaching patients who their staff is, what the staff responsibilities are and orienting them to the unit programming and schedule.  Participation Level:Did not attend.  Participation Quality:    Affect:    Cognitive:    Insight:    Engagement in Group:    Modes of Intervention:    Summary of Progress/Problems:  Shelby Brown, Shelby Brown Lynn 11/02/2017, 9:25 AM

## 2017-11-02 NOTE — Progress Notes (Signed)
Psychoeducational Group Note  Date:  11/02/2017 Time:  1405  Group Topic/Focus:  Recovery Goals:   The focus of this group is to identify appropriate goals for recovery and establish a plan to achieve them.  Participation Level: Did Not Attend  Participation Quality:  Not Applicable  Affect:  Not Applicable  Cognitive:  Not Applicable  Insight:  Not Applicable  Engagement in Group: Not Applicable  Additional Comments:  Pt was asleep and did not attend group this morning  Makaria Poarch E 11/02/2017, 3:04 PM

## 2017-11-02 NOTE — H&P (Signed)
Psychiatric Admission Assessment Adult  Patient Identification: Shelby Brown MRN:  [id number] Date of Evaluation:  11/02/2017 Chief Complaint:  Suicidal attempt Principal Diagnosis: MDD Diagnosis:   Patient Active Problem List   Diagnosis Date Noted  . MDD (major depressive disorder) [F32.9] 11/01/2017  . Intentional metformin overdose (HCC) [T38.3X2A] 10/27/2017  . Adjustment disorder with mixed anxiety and depressed mood [F43.23] 10/27/2017  . Financial difficulties [Z59.8] 01/06/2014  . Cough [R05] 01/06/2014  . DYSLIPIDEMIA [E78.5] 12/10/2007  . Diabetes type 2, uncontrolled (HCC) [E11.65] 08/07/2006   History of Present Illness:  37 y.o Hispanic female, married, lives with her family. Background history of DM. No past history of mental illness. Presented to the ER via emergency services. She overdosed on seven pills of Metformin. Her daughter called 911. Patient was managed medically and stepped down to our unit. Overdose was reported to be in context of an argument with her husband. No substance use.  At interview,  patient repeatedly denied any intent to end her own life. Says she had not slept in days and she was having excruciating headache. Says she did not have any other medication. She figured if she took more of her Metformin she would be able to sleep. Says she loves her family and would never do anything to harm them. Patient minimizes reports of arguments with her husband. Says she was upset with her daughter and yelled ather daughter. Her husband cautioned her about it. Patient says they are a close and happy family. Denies any domestic issues. Says she still struggles with sleep. She is scared of taking any medication that is addictive.  No goodbye messages before she took the OD. No measures to avoid being detected. She did not research on the effects of the medications on the body. She has not been thinking about it for a long time. Patient is remorseful. No current  suicidal thoughts. No residual cognitive dulling. No associated psychosis. No evidence of mania. No overwhelming anxiety. No evidence of PTSD. No substance use. No thoughts of harming others. No thoughts of violence. No access to weapons.    Total Time spent with patient: 1 hour  Past Psychiatric History: No past history of depression. No past history of cutting or self mutilation. She was started on Citalopram during this visit.  No past history of mania. No past history of psychosis. No past history of suicidal behavior. No past history of violent behavior. She has never had any inpatient treatment in the past. No past psychological or physical treatment.    Is the patient at risk to self? No.  Has the patient been a risk to self in the past 6 months? No.  Has the patient been a risk to self within the distant past? No.  Is the patient a risk to others? No.  Has the patient been a risk to others in the past 6 months? No.  Has the patient been a risk to others within the distant past? No.   Prior Inpatient Therapy:   Prior Outpatient Therapy:    Alcohol Screening: 1. How often do you have a drink containing alcohol?: Never 2. How many drinks containing alcohol do you have on a typical day when you are drinking?: 1 or 2 3. How often do you have six or more drinks on one occasion?: Never AUDIT-C Score: 0 4. How often during the last year have you found that you were not able to stop drinking once you had started?: Never 5. How often during  the last year have you failed to do what was normally expected from you becasue of drinking?: Never 6. How often during the last year have you needed a first drink in the morning to get yourself going after a heavy drinking session?: Never 7. How often during the last year have you had a feeling of guilt of remorse after drinking?: Never 8. How often during the last year have you been unable to remember what happened the night before because you had been  drinking?: Never 9. Have you or someone else been injured as a result of your drinking?: No 10. Has a relative or friend or a doctor or another health worker been concerned about your drinking or suggested you cut down?: No Alcohol Use Disorder Identification Test Final Score (AUDIT): 0 Intervention/Follow-up: AUDIT Score <7 follow-up not indicated Substance Abuse History in the last 12 months:  No. Consequences of Substance Abuse: NA Previous Psychotropic Medications: No  Psychological Evaluations: No  Past Medical History:  Past Medical History:  Diagnosis Date  . Diabetes mellitus without complication [facility])     Past Surgical History:  Procedure Laterality Date  . CESAREAN SECTION    . TUBAL LIGATION     Family History:  Family History  Problem Relation Age of Onset  . Cancer Father   . Diabetes Sister   . Diabetes Sister    Family Psychiatric  History: Denies any family history of mental illness, substance use disorder or suicide.  Tobacco Screening: Have you used any form of tobacco in the last 30 days? (Cigarettes, Smokeless Tobacco, Cigars, and/or Pipes): No Social History:  Social History   Substance and Sexual Activity  Alcohol Use No     Social History   Substance and Sexual Activity  Drug Use No    Additional Social History:                           Allergies:  No Known Allergies Lab Results:  Results for orders placed or performed during the hospital encounter of 11/01/17 (from the past 48 hour(s))  Glucose, capillary     Status: Abnormal   Collection Time: 11/01/17 10:13 PM  Result Value Ref Range   Glucose-Capillary 151 (H) 65 - 99 mg/dL  Glucose, capillary     Status: Abnormal   Collection Time: 11/02/17 12:05 PM  Result Value Ref Range   Glucose-Capillary 235 (H) 65 - 99 mg/dL    Blood Alcohol level:  Lab Results  Component Value Date   ETH 109 (H) 10/27/2017   ETH 159 (H) 03/09/2013    Metabolic Disorder Labs:  Lab Results   Component Value Date   HGBA1C 13.5 (H) 10/27/2017   MPG 340.75 10/27/2017   No results found for: PROLACTIN Lab Results  Component Value Date   CHOL 217 (H) 12/01/2008   TRIG 160 (H) 12/01/2008   HDL 66 12/01/2008   CHOLHDL 3.3 Ratio 12/01/2008   VLDL 32 12/01/2008   LDLCALC 119 (H) 12/01/2008   LDLCALC 120 (H) 11/26/2007    Current Medications: Current Facility-Administered Medications  Medication Dose Route Frequency Provider Last Rate Last Dose  . acetaminophen (TYLENOL) tablet 650 mg  650 mg Oral Q6H PRN Okonkwo, Justina A, NP      . alum & mag hydroxide-simeth (MAALOX/MYLANTA) 200-200-20 MG/5ML suspension 30 mL  30 mL Oral Q4H PRN Okonkwo, Justina A, NP      . citalopram (CELEXA) tablet 10 mg  10  mg Oral Daily Okonkwo, Justina A, NP   10 mg at 11/02/17 1253  . famotidine (PEPCID) tablet 20 mg  20 mg Oral BID Okonkwo, Justina A, NP   20 mg at 11/02/17 1254  . hydrOXYzine (ATARAX/VISTARIL) tablet 25 mg  25 mg Oral TID PRN Okonkwo, Justina A, NP      . insulin aspart (novoLOG) injection 0-15 Units  0-15 Units Subcutaneous TID WC Okonkwo, Justina A, NP   5 Units at 11/02/17 1257  . insulin aspart (novoLOG) injection 0-5 Units  0-5 Units Subcutaneous QHS Okonkwo, Justina A, NP      . insulin glargine (LANTUS) injection 6 Units  6 Units Subcutaneous QHS Okonkwo, Justina A, NP   6 Units at 11/01/17 2252  . magnesium hydroxide (MILK OF MAGNESIA) suspension 30 mL  30 mL Oral Daily PRN Okonkwo, Justina A, NP      . sodium chloride (OCEAN) 0.65 % nasal spray 1 spray  1 spray Each Nare PRN Okonkwo, Justina A, NP      . traZODone (DESYREL) tablet 50 mg  50 mg Oral QHS PRN Okonkwo, Justina A, NP       PTA Medications: Medications Prior to Admission  Medication Sig Dispense Refill Last Dose  . Blood Glucose Monitoring Suppl (RELION PRIME MONITOR) DEVI Please use device to check blood sugars four (4) times per day, before meals and at bedtime. 1 Device 0 Not Taking  . citalopram (CELEXA) 10  MG tablet Take 1 tablet (10 mg total) by mouth daily.     Marland Kitchen. glimepiride (AMARYL) 2 MG tablet Take 1 tablet (2 mg total) daily before breakfast by mouth. 30 tablet 3 10/26/2017 at Unknown time  . glucose blood test strip Use to check blood sugar four (4) times per day, before meals and at bedtime 100 each 12 Not Taking  . insulin aspart (NOVOLOG) 100 UNIT/ML injection 0-15 Units, Subcutaneous, 3 times daily with meals CBG < 70: implement hypoglycemia protocol CBG 70 - 120: 0 units CBG 121 - 150: 2 units CBG 151 - 200: 3 units CBG 201 - 250: 5 units CBG 251 - 300: 8 units CBG 301 - 350: 11 units CBG 351 - 400: 15 units CBG > 400: call MD and obtain STAT lab verification 10 mL 11   . insulin glargine (LANTUS) 100 UNIT/ML injection Inject 0.06 mLs (6 Units total) into the skin at bedtime. 10 mL 11   . Insulin Syringe-Needle U-100 31G X 15/64" 0.3 ML MISC Use twice daily to administer insulin (Patient not taking: Reported on 08/06/2017) 100 each 1 Not Taking at Unknown time  . RELION ULTRA THIN LANCETS 30G MISC Use to check blood sugars four (4) times per day, before meals and at bedtime 100 each 1     Musculoskeletal: Strength & Muscle Tone: within normal limits Gait & Station: normal Patient leans: N/A  Psychiatric Specialty Exam: Physical Exam  Constitutional: She is oriented to person, place, and time. She appears well-developed and well-nourished.  HENT:  Head: Normocephalic and atraumatic.  Respiratory: Effort normal.  Neurological: She is alert and oriented to person, place, and time.  Psychiatric:  As above    ROS  Blood pressure 101/69, pulse 88, temperature 98.2 F (36.8 C), temperature source Oral, resp. rate 18.There is no height or weight on file to calculate BMI.  General Appearance: In hospital clothing. Conscious of not being in own clothes. Wants to shower, groom self and be in own clothes. Related well.  Eye Contact:  Good  Speech:  Clear and Coherent and Normal Rate   Volume:  Normal  Mood:  Denies feeling depressed.   Affect:  Appropriate and Full Range  Thought Process:  Linear  Orientation:  Full (Time, Place, and Person)  Thought Content:  No delusional theme. No preoccupation with violent thoughts. No negative ruminations. No obsession.  No hallucination in any modality.   Suicidal Thoughts:  No  Homicidal Thoughts:  No  Memory:  Immediate;   Good Recent;   Good Remote;   Good  Judgement:  Fair  Insight:  Good  Psychomotor Activity:  Normal  Concentration:  Concentration: Good and Attention Span: Good  Recall:  Good  Fund of Knowledge:  Good  Language:  Good  Akathisia:  Negative  Handed:    AIMS (if indicated):     Assets:  Communication Skills Desire for Improvement Housing Intimacy Resilience  ADL's:  Intact  Cognition:  WNL  Sleep:  Number of Hours: 6    Treatment Plan Summary: Patient presented with impulsive OD. She is no longer expressing any futility thoughts. She is cooperative with care and wants her family involved. We have agreed to continue Citalopram at current dose. She consented to use of Trazodone for insomnia after we reviewed the risks and benefits.    Psychiatric: MDD  Medical: DM  Psychosocial:  ?Family dynamics   PLAN: 1. Citalopram 20 mg daily 2. Trazodone 50 mg at bedtime 3. Encourage unit groups and therapeutic activities 4. Monitor mood, behavior and interaction with peers 5. SW would gather collateral from her family and coordinate aftercare   Observation Level/Precautions:  15 minute checks  Laboratory:    Psychotherapy:    Medications:    Consultations:    Discharge Concerns:    Estimated LOS:  Other:     Physician Treatment Plan for Primary Diagnosis: <principal problem not specified> Long Term Goal(s): Improvement in symptoms so as ready for discharge  Short Term Goals: Ability to identify changes in lifestyle to reduce recurrence of condition will improve, Ability to verbalize  feelings will improve, Ability to disclose and discuss suicidal ideas, Ability to demonstrate self-control will improve, Ability to identify and develop effective coping behaviors will improve, Ability to maintain clinical measurements within normal limits will improve and Compliance with prescribed medications will improve  Physician Treatment Plan for Secondary Diagnosis: Active Problems:   MDD (major depressive disorder)  Long Term Goal(s): Improvement in symptoms so as ready for discharge  Short Term Goals: Ability to identify changes in lifestyle to reduce recurrence of condition will improve, Ability to verbalize feelings will improve, Ability to disclose and discuss suicidal ideas, Ability to demonstrate self-control will improve, Ability to identify and develop effective coping behaviors will improve, Ability to maintain clinical measurements within normal limits will improve and Compliance with prescribed medications will improve  I certify that inpatient services furnished can reasonably be expected to improve the patient's condition.    Georgiann Cocker, MD 2/15/20194:08 PM

## 2017-11-02 NOTE — Progress Notes (Signed)
Adult Psychoeducational Group Note  Date:  11/02/2017 Time:  9:43 PM  Group Topic/Focus:  Wrap-Up Group:   The focus of this group is to help patients review their daily goal of treatment and discuss progress on daily workbooks.  Participation Level:  Active  Participation Quality:  Appropriate  Affect:  Appropriate  Cognitive:  Appropriate  Insight: Appropriate  Engagement in Group:  Engaged  Modes of Intervention:  Discussion  Additional Comments:  Patient attended wrap-up group and participated.   Deacon Gadbois W Audley Hinojos 11/02/2017, 9:43 PM

## 2017-11-02 NOTE — Progress Notes (Signed)
Recreation Therapy Notes  Date: 11/02/17 Time: 0930 Location: [address] Dayroom  Group Topic: Stress Management  Goal Area(s) Addresses:  Patient will verbalize importance of using healthy stress management.  Patient will identify positive emotions associated with healthy stress management.   Intervention: Stress Management  Activity :  Body Scan Meditation.  LRT introduced the stress management technique of meditation.  LRT played a meditation from the Calm app that allowed patients to take inventory of any sensations or tension they may have been experiencing.  Education:  Stress Management, Discharge Planning.   Education Outcome: Acknowledges edcuation/In group clarification offered/Needs additional education  Clinical Observations/Feedback: Pt did not attend group.    Caroll RancherMarjette Bryann Mcnealy, LRT/CTRS         Caroll RancherLindsay, Tmya Wigington A 11/02/2017 12:50 PM

## 2017-11-03 DIAGNOSIS — T383X1A Poisoning by insulin and oral hypoglycemic [antidiabetic] drugs, accidental (unintentional), initial encounter: Secondary | ICD-10-CM

## 2017-11-03 DIAGNOSIS — F329 Major depressive disorder, single episode, unspecified: Secondary | ICD-10-CM

## 2017-11-03 DIAGNOSIS — G4700 Insomnia, unspecified: Secondary | ICD-10-CM

## 2017-11-03 DIAGNOSIS — Z630 Problems in relationship with spouse or partner: Secondary | ICD-10-CM

## 2017-11-03 DIAGNOSIS — E119 Type 2 diabetes mellitus without complications: Secondary | ICD-10-CM

## 2017-11-03 LAB — GLUCOSE, CAPILLARY
GLUCOSE-CAPILLARY: 177 mg/dL — AB (ref 65–99)
GLUCOSE-CAPILLARY: 285 mg/dL — AB (ref 65–99)
GLUCOSE-CAPILLARY: 266 mg/dL — AB (ref 65–99)
Glucose-Capillary: 311 mg/dL — ABNORMAL HIGH (ref 65–99)

## 2017-11-03 MED ORDER — CITALOPRAM HYDROBROMIDE 20 MG PO TABS
20.0000 mg | ORAL_TABLET | Freq: Every day | ORAL | Status: DC
  Administered 2017-11-04: 20 mg via ORAL
  Filled 2017-11-03 (×3): qty 1

## 2017-11-03 MED ORDER — TRAZODONE HCL 100 MG PO TABS
100.0000 mg | ORAL_TABLET | Freq: Every day | ORAL | Status: DC
  Administered 2017-11-03: 100 mg via ORAL
  Filled 2017-11-03 (×3): qty 1
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
## 2017-11-03 NOTE — [facility] Group Notes (Signed)
Orientation / Goals   Date:  11/03/2017  Time:  6:46 PM  Type of Therapy:  Nurse Education  The group focused on teaching  Patients who teir staff is and what their staff repsonsibilities.  Participation Level:  Active  Participation Quality:  Attentive  Affect:  Anxious  Cognitive:  Alert  Insight:  Appropriate  Engagement in Group:  Limited  Modes of Intervention:  Education  Summary of Progress/Problems:  Rich BraveDuke, Thersa Mohiuddin Lynn 11/03/2017, 6:46 PM

## 2017-11-03 NOTE — Progress Notes (Signed)
D: Pt visible in dayroom for majority of this shift. Assessment done via spanish interpreter. Pt A & O X4. Denies SI, HI, AVH and pain at this time. Reports poor sleep last night "I just couldn't go back to sleep again after I woke up in the night". Observed in scheduled unit groups. Rates her depression 2/10 'I want to go home" and anxiety 5/10.  A: All medications given as per MD's order with verbal education and effects monitored. Emotional support and encouragement provided to pt. Safety checks maintained on and off unit without self harm gestures.  R: Pt receptive to care. Remains medication compliant. Denies adverse drug reactions. Tolerates all PO intake well. POC continues for safety and mood stability.

## 2017-11-03 NOTE — Progress Notes (Signed)
[facility] MD Progress Note  11/03/2017 2:24 PM Shelby Brown  MRN:  [id number] Subjective:  37 y.o Hispanic female, married, lives with her family. Background history of DM. No past history of mental illness. Presented to the ER via emergency services. She overdosed on seven pills of Metformin. Her daughter called 911. Patient was managed medically and stepped down to our unit. Overdose was reported to be in context of an argument with her husband. No substance use.  Chart reviewed today. Patient discussed at team today.  Staff reports that she is grooming self. She is more interactive. No behavioral issues. She has not voiced any futility thoughts.   Seen today. Looks brighter. She was chatting on the phone prior to interview. Seen with an interpreter. Says she is missing her family. She repeatedly tells me that she was never suicidal. Says she slept a bit better last night. She wants higher dose of the medication. Says her husband is visiting today. He hopes to speak with [place]. Says her family came to see her yesterday. She is eating well. She is grooming self and engaging with the milieu. Patient repeatedly sites factors that protects her against suicide. Encouraged.   Principal Problem: MDD Diagnosis:   Patient Active Problem List   Diagnosis Date Noted  . MDD (major depressive disorder) [F32.9] 11/01/2017  . Intentional metformin overdose (HCC) [T38.3X2A] 10/27/2017  . Adjustment disorder with mixed anxiety and depressed mood [F43.23] 10/27/2017  . Financial difficulties [Z59.8] 01/06/2014  . Cough [R05] 01/06/2014  . DYSLIPIDEMIA [E78.5] 12/10/2007  . Diabetes type 2, uncontrolled (HCC) [E11.65] 08/07/2006   Total Time spent with patient: 20 minutes  Past Psychiatric History: As in H&P  Past Medical History:  Past Medical History:  Diagnosis Date  . Diabetes mellitus without complication [facility])     Past Surgical History:  Procedure Laterality Date  . CESAREAN SECTION    .  TUBAL LIGATION     Family History:  Family History  Problem Relation Age of Onset  . Cancer Father   . Diabetes Sister   . Diabetes Sister    Family Psychiatric  History: As in H&P Social History:  Social History   Substance and Sexual Activity  Alcohol Use No     Social History   Substance and Sexual Activity  Drug Use No    Social History   Socioeconomic History  . Marital status: Married    Spouse name: None  . Number of children: None  . Years of education: None  . Highest education level: None  Social Needs  . Financial resource strain: None  . Food insecurity - worry: None  . Food insecurity - inability: None  . Transportation needs - medical: None  . Transportation needs - non-medical: None  Occupational History  . None  Tobacco Use  . Smoking status: Never Smoker  . Smokeless tobacco: Never Used  Substance and Sexual Activity  . Alcohol use: No  . Drug use: No  . Sexual activity: Yes  Other Topics Concern  . None  Social History Narrative  . None   Additional Social History:                         Sleep: Better  Appetite:  Good  Current Medications: Current Facility-Administered Medications  Medication Dose Route Frequency Provider Last Rate Last Dose  . acetaminophen (TYLENOL) tablet 650 mg  650 mg Oral Q6H PRN Okonkwo, Justina A, NP      .  alum & mag hydroxide-simeth (MAALOX/MYLANTA) 200-200-20 MG/5ML suspension 30 mL  30 mL Oral Q4H PRN Okonkwo, Justina A, NP      . citalopram (CELEXA) tablet 10 mg  10 mg Oral Daily Okonkwo, Justina A, NP   10 mg at 11/03/17 0826  . famotidine (PEPCID) tablet 20 mg  20 mg Oral BID Okonkwo, Justina A, NP   20 mg at 11/03/17 0826  . hydrOXYzine (ATARAX/VISTARIL) tablet 25 mg  25 mg Oral TID PRN Beryle Lathe, Justina A, NP      . insulin aspart (novoLOG) injection 0-15 Units  0-15 Units Subcutaneous TID WC Okonkwo, Justina A, NP   8 Units at 11/03/17 1214  . insulin aspart (novoLOG) injection 0-5 Units   0-5 Units Subcutaneous QHS Okonkwo, Justina A, NP   4 Units at 11/02/17 2205  . insulin glargine (LANTUS) injection 6 Units  6 Units Subcutaneous QHS Okonkwo, Justina A, NP   6 Units at 11/02/17 2205  . magnesium hydroxide (MILK OF MAGNESIA) suspension 30 mL  30 mL Oral Daily PRN Okonkwo, Justina A, NP      . sodium chloride (OCEAN) 0.65 % nasal spray 1 spray  1 spray Each Nare PRN Okonkwo, Justina A, NP      . traZODone (DESYREL) tablet 50 mg  50 mg Oral QHS Jleigh Striplin, Delight Ovens, MD   50 mg at 11/02/17 2201    Lab Results:  Results for orders placed or performed during the hospital encounter of 11/01/17 (from the past 48 hour(s))  Glucose, capillary     Status: Abnormal   Collection Time: 11/01/17 10:13 PM  Result Value Ref Range   Glucose-Capillary 151 (H) 65 - 99 mg/dL  Glucose, capillary     Status: Abnormal   Collection Time: 11/02/17 12:05 PM  Result Value Ref Range   Glucose-Capillary 235 (H) 65 - 99 mg/dL  Glucose, capillary     Status: Abnormal   Collection Time: 11/02/17  5:22 PM  Result Value Ref Range   Glucose-Capillary 279 (H) 65 - 99 mg/dL  Glucose, capillary     Status: Abnormal   Collection Time: 11/02/17  8:47 PM  Result Value Ref Range   Glucose-Capillary 304 (H) 65 - 99 mg/dL   Comment 1 Notify RN   Glucose, capillary     Status: Abnormal   Collection Time: 11/03/17  5:58 AM  Result Value Ref Range   Glucose-Capillary 177 (H) 65 - 99 mg/dL   Comment 1 Notify RN   Glucose, capillary     Status: Abnormal   Collection Time: 11/03/17 12:03 PM  Result Value Ref Range   Glucose-Capillary 285 (H) 65 - 99 mg/dL    Blood Alcohol level:  Lab Results  Component Value Date   ETH 109 (H) 10/27/2017   ETH 159 (H) 03/09/2013    Metabolic Disorder Labs: Lab Results  Component Value Date   HGBA1C 13.5 (H) 10/27/2017   MPG 340.75 10/27/2017   No results found for: PROLACTIN Lab Results  Component Value Date   CHOL 217 (H) 12/01/2008   TRIG 160 (H) 12/01/2008    HDL 66 12/01/2008   CHOLHDL 3.3 Ratio 12/01/2008   VLDL 32 12/01/2008   LDLCALC 119 (H) 12/01/2008   LDLCALC 120 (H) 11/26/2007    Physical Findings: AIMS: Facial and Oral Movements Muscles of Facial Expression: None, normal Lips and Perioral Area: None, normal Jaw: None, normal Tongue: None, normal,Extremity Movements Upper (arms, wrists, hands, fingers): None, normal Lower (legs, knees, ankles, toes):  None, normal, Trunk Movements Neck, shoulders, hips: None, normal, Overall Severity Severity of abnormal movements (highest score from questions above): None, normal Incapacitation due to abnormal movements: None, normal Patient's awareness of abnormal movements (rate only patient's report): No Awareness, Dental Status Current problems with teeth and/or dentures?: No Does patient usually wear dentures?: No  CIWA:    COWS:     Musculoskeletal: Strength & Muscle Tone: within normal limits Gait & Station: normal Patient leans: N/A  Psychiatric Specialty Exam: Physical Exam  Constitutional: She is oriented to person, place, and time. She appears well-developed and well-nourished.  HENT:  Head: Atraumatic.  Respiratory: Effort normal.  Neurological: She is alert and oriented to person, place, and time.  Psychiatric:  As above     ROS  Blood pressure (!) 75/50, pulse 96, temperature 99.3 F (37.4 C), temperature source Oral, resp. rate 18.There is no height or weight on file to calculate BMI.  General Appearance: Neatly dressed, pleasant, engaging well and cooperative. Appropriate behavior.   Eye Contact:  Good  Speech:  Clear and Coherent and Normal Rate  Volume:  Normal  Mood:  Worried about her family  Affect:  Appropriate and Full Range  Thought Process:  Linear  Orientation:  Full (Time, Place, and Person)  Thought Content:  No delusional theme. No preoccupation with violent thoughts. No negative ruminations. No obsession.  No hallucination in any modality.    Suicidal Thoughts:  No  Homicidal Thoughts:  No  Memory:  Immediate;   Good Recent;   Good Remote;   Good  Judgement:  Good  Insight:  Good  Psychomotor Activity:  Normal  Concentration:  Concentration: Good and Attention Span: Good  Recall:  Good  Fund of Knowledge:  Good  Language:  Good  Akathisia:  Negative  Handed:    AIMS (if indicated):     Assets:  Communication Skills Desire for Improvement Financial Resources/Insurance Housing Intimacy Resilience  ADL's:  Intact  Cognition:  WNL  Sleep:  Number of Hours: 6     Treatment Plan Summary: Patient is not pervasively depressed. She slept better last night. We have agreed to adjust her sleep aide more. We plan to evaluate her further and gather collateral from her family. Hopeful discharge tomorrow if she remains stable.    Psychiatric: MDD  Medical: DM  Psychosocial:  ?Family dynamics   PLAN: 1. Increase Trazodone to 100 mg at bedtime 2. SW would gather collateral from  her husband 3. Continue to monitor mood, behavior and interaction with peers     Georgiann CockerVincent A Chrisie Jankovich, MD 11/03/2017, 2:24 PM

## 2017-11-03 NOTE — [facility] Suicide Risk Assessment (Signed)
[facility] INPATIENT:  Family/Significant Other Suicide Prevention Education  Suicide Prevention Education:  Education Completed; Shelby Brown, husband, with interpreter Shelby Brown,  has been identified by the patient as the family member/significant other with whom the patient will be residing, and identified as the person(s) who will aid the patient in the event of a mental health crisis (suicidal ideations/suicide attempt).  With written consent from the patient, the family member/significant other has been provided the following suicide prevention education, prior to the and/or following the discharge of the patient.  The suicide prevention education provided includes the following:  Suicide risk factors  Suicide prevention and interventions  National Suicide Hotline telephone number  [facility] assessment telephone number  [facility] Emergency Assistance 911  [facility] and/or Residential [facility] Crisis [facility] telephone number  Request made of family/significant other to:  Remove weapons (e.g., guns, rifles, knives), all items previously/currently identified as safety concern.    Remove drugs/medications (over-the-counter, prescriptions, illicit drugs), all items previously/currently identified as a safety concern.  The family member/significant other verbalizes understanding of the suicide prevention education information provided.  The family member/significant other agrees to remove the items of safety concern listed above.  Spanish brochure was given and information was reviewed thoroughly.  Particular emphasis was placed on how to support patient and not assume she would not want to harm herself because her family loves her and she loves them.  Shelby Brown 11/03/2017, 5:11 PM

## 2017-11-03 NOTE — B[facility] Counselor (Signed)
Clinical Social Work Note  CSW met with patient's husband Marlise Eves in person with patient's written consent.  He states he does not believe she intentionally took an overdose, and is adamant she did not want to die. He also denies that his wife took an overdose 6 months ago, just as she has denied.   He believes that her language barrier contributes to her lack of understanding as to how much medicine to take for her diabetes, as well as the fact that it is NOT for sleep.  She goes to bed at 10-11pm, but has great difficulty sleeping due to pain in her legs.  Sometimes he has to rub her legs in the middle of the night to try to help her rest.  He believes she needs medicine to help her sleep, but does not think she would need it every night, only as needed.  CSW explained she has been started on a medicine and it is due to be increased tonight per doctor.    Husband states patient's source of depression is money problems, and this is what she shared during the Psychosocial Assessment as well.  He acknowledges that she is on an anti-depressant as of being in the hospital and she will need follow-up.  After some insistence by CSW, he acknowledges that she is depressed and states he thinks it is about their money situation.  A nursing instructor who speaks Spanish and is an interpreter in another job was brought in [employer] Janus) to interpret to ensure that husband understood clearly.  Suicide Prevention Education was provided thoroughly, and a brochure in both [place] and English were given to him to share with his daughters.  Hiusband also stated when she takes insulin it makes her vomit quite a bit because she smells "something that gives her nausea."  When told she is on insulin here in the hospital, he said she is telling him she has bad headaches as a result.  Husband states he is comfortable and excited about taking his wife home tomorrow, and was told that if she sleeps well the plan is for her to  discharge tomorrow.  We also discussed her follow-up plans.  She sometimes goes to [facility], and sometimes to another clinic which name he does not have.  It was emphasized to him numerous times through the interpreter that it is imperative that he take her for follow-up for her diabetes medication, her sleep medicine, and her anti-depressant.  He was not willing for CSW staff to arrange the appointment and call him with the information, as he has to ask for time off and then has to find a friend whose car he can borrow.  He was adamant that he will make the appointment himself.  CSW insisted to him that this appointment must be no later than Monday 11/12/17, and he agreed to ask for the day off that day and to get her an appointment.  Selmer Dominion, LCSW 11/03/2017, 3:15 PM
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
## 2017-11-04 DIAGNOSIS — T1491XA Suicide attempt, initial encounter: Secondary | ICD-10-CM

## 2017-11-04 DIAGNOSIS — F332 Major depressive disorder, recurrent severe without psychotic features: Principal | ICD-10-CM

## 2017-11-04 DIAGNOSIS — F4325 Adjustment disorder with mixed disturbance of emotions and conduct: Secondary | ICD-10-CM

## 2017-11-04 DIAGNOSIS — T383X2A Poisoning by insulin and oral hypoglycemic [antidiabetic] drugs, intentional self-harm, initial encounter: Secondary | ICD-10-CM

## 2017-11-04 LAB — GLUCOSE, CAPILLARY
GLUCOSE-CAPILLARY: 296 mg/dL — AB (ref 65–99)
Glucose-Capillary: 219 mg/dL — ABNORMAL HIGH (ref 65–99)

## 2017-11-04 MED ORDER — HYDROXYZINE HCL 25 MG PO TABS: 25.0000 mg | ORAL_TABLET | Freq: Three times a day (TID) | ORAL | 0 refills | Status: DC | PRN

## 2017-11-04 MED ORDER — TRAZODONE HCL 100 MG PO TABS: 100.0000 mg | ORAL_TABLET | Freq: Every day | ORAL | 0 refills | Status: DC

## 2017-11-04 MED ORDER — CITALOPRAM HYDROBROMIDE 20 MG PO TABS
20.0000 mg | ORAL_TABLET | Freq: Every day | ORAL | 0 refills | Status: DC
Start: 2017-11-05 — End: 2020-09-02

## 2017-11-04 NOTE — [facility] Group Notes (Signed)
[facility] Note  Date/Time:  11/04/2017 10:00-11:00AM  Type of Therapy and Topic:  Group Therapy:  Healthy and Unhealthy Supports  Participation Level:  Active   Description of Group:  Patients in this group were introduced to the idea of adding a variety of healthy supports to address the various needs in their lives.Patients discussed what additional healthy supports could be helpful in their recovery and wellness after discharge in order to prevent future hospitalizations.   An emphasis was placed on using counselor, doctor, therapy groups, 12-step groups, and problem-specific support groups to expand supports.  They also worked as a group on developing a specific plan for several patients to deal with unhealthy supports through boundary-setting, psychoeducation with loved ones, and even termination of relationships.   Therapeutic Goals:   1)  discuss importance of adding supports to stay well once out of the hospital  2)  compare healthy versus unhealthy supports and identify some examples of each  3)  generate ideas and descriptions of healthy supports that can be added  4)  offer mutual support about how to address unhealthy supports  5)  encourage active participation in and adherence to discharge plan    Summary of Patient Progress:  The patient expressed a willingness to add medical follow-up to help in her recovery journey.   Therapeutic Modalities:   Motivational Interviewing Brief Solution-Focused Therapy  Ambrose MantleMareida Grossman-Orr, LCSW

## 2017-11-04 NOTE — Progress Notes (Signed)
  [facility] Discharge [facility] :  Will you be returning to the same living situation after discharge:  Yes,  with husband and children At discharge, do you have transportation home?: Yes,  husband Do you have the ability to pay for your medications: No.  Being sent to follow-up that can help, and husband informed to talk with provider about keeping medications inexpensive.  Release of information consent forms completed and turned in to Medical Records.  Patient to Follow up at: Follow-up Information    [facility] Follow up.   Why:  Patient's husband is going to make the needed follow-up appointment for no later than Monday 11/12/2017.  He declines assistance with this due to his work schedule and transportation issues that he must arrange. Contact information: [address] [place] [phone]          Next level of care provider has access to [facility] Link:no  Safety Planning and Suicide Prevention discussed: Yes,  with husband and with patient  Have you used any form of tobacco in the last 30 days? (Cigarettes, Smokeless Tobacco, Cigars, and/or Pipes): No  Has patient been referred to the Quitline?: N/A patient is not a smoker  Patient has been referred for addiction treatment: N/A  Lynnell ChadMareida J Grossman-Orr, LCSW 11/04/2017, 12:14 PM

## 2017-11-04 NOTE — Progress Notes (Signed)
Writer spoke with patient 1:1 with presence of her translator. She reports having had a good day and plans to do what she needs to do while here in order to discharge. She reports that she regrets taking the overdose. Writer informed her of her medications scheduled and prns available if needed. She attended group and has been up in the dayroom. She denies si/hi/a/v hallucinations. Support givne and safety maintained on unit with 15 min checks.

## 2017-11-04 NOTE — Progress Notes (Addendum)
D: Pt was visible in hallway and dayroom on initial contact. Shift assessment and discharge instructions completed with interpretor present. Pt denied pain, AVH, and SI/HI. Pt was animated and pleasant. Pt was discharged home per MD order. Pt was picked up in the lobby by her husband.  A:All notes and assessments reviewed by assigned RN Lincoln Maxin(Olivette W.). All medications given as scheduled. Emotional support provided to patient. Discharge instructions including prsecriptions and follow-up appointment reviewed with pt.  RN encouraged pt to comply with discharge instructions. Pt had no belongings in locker at time of admission or discharge. Q15 minute safety checks remained effective until discharge.  R: Pt signed belongings sheet in agreement that she had no belongings. Pt verbalized understanding related to discharge instructions, prescriptions, and follow-up appointment. Pt was ambulatory with a steady gait. Appears to be in no physical distress at time of departure.

## 2017-11-04 NOTE — Discharge Summary (Signed)
Physician Discharge Summary Note  Patient:  Shelby Brown is an 37 y.o., female MRN:  [id number] DOB:  09-24-1980 Patient phone:  [phone] (home)  Patient address:   [address] [place] [address],  Total Time spent with patient: 45 minutes  Date of Admission:  11/01/2017 Date of Discharge: 11/04/2017  Reason for Admission:  Intention overdose  Principal Problem: Major depressive disorder, recurrent, severe without psychosis Discharge Diagnoses: Patient Active Problem List   Diagnosis Date Noted  . MDD (major depressive disorder) [F32.9] 11/01/2017  . Intentional metformin overdose ([place]) [T38.3X2A] 10/27/2017  . Adjustment disorder with mixed anxiety and depressed mood [F43.23] 10/27/2017  . Financial difficulties [Z59.8] 01/06/2014  . Cough [R05] 01/06/2014  . DYSLIPIDEMIA [E78.5] 12/10/2007  . Diabetes type 2, uncontrolled ([place]) [E11.65] 08/07/2006    Past Psychiatric History: depression  Past Medical History:  Past Medical History:  Diagnosis Date  . Diabetes mellitus without complication [facility])     Past Surgical History:  Procedure Laterality Date  . CESAREAN SECTION    . TUBAL LIGATION     Family History:  Family History  Problem Relation Age of Onset  . Cancer Father   . Diabetes Sister   . Diabetes Sister    Family Psychiatric  History: none Social History:  Social History   Substance and Sexual Activity  Alcohol Use No     Social History   Substance and Sexual Activity  Drug Use No    Social History   Socioeconomic History  . Marital status: Married    Spouse name: None  . Number of children: None  . Years of education: None  . Highest education level: None  Social Needs  . Financial resource strain: None  . Food insecurity - worry: None  . Food insecurity - inability: None  . Transportation needs - medical: None  . Transportation needs - non-medical: None  Occupational History  . None  Tobacco Use  . Smoking status:  Never Smoker  . Smokeless tobacco: Never Used  Substance and Sexual Activity  . Alcohol use: No  . Drug use: No  . Sexual activity: Yes  Other Topics Concern  . None  Social History Narrative  . None    Hospital Course:  On admission 11/02/2017, transfer from medical hospital: 37 y.o Hispanic female, married, lives with her family. Background history of DM. No past history of mental illness. Presented to the ER via emergency services. She overdosed on seven pills of Metformin. Her daughter called 911. Patient was managed medically and stepped down to our unit. Overdose was reported to be in context of an argument with her husband. No substance use.  At interview,  patient repeatedly denied any intent to end her own life. Says she had not slept in days and she was having excruciating headache. Says she did not have any other medication. She figured if she took more of her Metformin she would be able to sleep. Says she loves her family and would never do anything to harm them. Patient minimizes reports of arguments with her husband. Says she was upset with her daughter and yelled ather daughter. Her husband cautioned her about it. Patient says they are a close and happy family. Denies any domestic issues. Says she still struggles with sleep. She is scared of taking any medication that is addictive.  No goodbye messages before she took the OD. No measures to avoid being detected. She did not research on the effects of the medications on the  body. She has not been thinking about it for a long time. Patient is remorseful. No current suicidal thoughts. No residual cognitive dulling. No associated psychosis. No evidence of mania. No overwhelming anxiety. No evidence of PTSD. No substance use. No thoughts of harming others. No thoughts of violence. No access to weapons.   Medications:  Continued Celexa 20 mg daily for depression along with Vistaril 25 mg TID PRN anxiety, started Trazodone 50 mg at bedtime  PRN sleep  11/03/2017:  Subjective:  37y.o Hispanic female, married, lives with her family. Background history ofDM. No past history of mental illness. Presented to the [facility] emergency services. She overdosed on seven pills of Metformin. Her daughter called 911. Patient was managed medically and stepped down to our unit. Overdose was reported to be in context of an argument with her husband.No substance use.  Chart reviewed today. Patient discussed at team today.  Staff reports that she is grooming self. She is more interactive. No behavioral issues. She has not voiced any futility thoughts.   Seen today. Looks brighter. She was chatting on the phone prior to interview. Seen with an interpreter. Says she is missing her family. She repeatedly tells me that she was never suicidal. Says she slept a bit better last night. She wants higher dose of the medication. Says her husband is visiting today. He hopes to speak with [place]. Says her family came to see her yesterday. She is eating well. She is grooming self and engaging with the milieu. Patient repeatedly sites factors that protects her against suicide. Encouraged.   Medications:  Continue medications with increase in Trazodone 50 mg PRN sleep to 100 mg  11/04/2017:  Patient has met maximum benefit of hospitalization.  She has attended individual and group therapy.  No suicidal/homicidal ideations, hallucinations, or substance abuse issues.  Rx, crisis numbers, and follow-up appointment information provided at discharge.  Stable for discharge. Physical Findings: AIMS: Facial and Oral Movements Muscles of Facial Expression: None, normal Lips and Perioral Area: None, normal Jaw: None, normal Tongue: None, normal,Extremity Movements Upper (arms, wrists, hands, fingers): None, normal Lower (legs, knees, ankles, toes): None, normal, Trunk Movements Neck, shoulders, hips: None, normal, Overall Severity Severity of abnormal movements (highest score  from questions above): None, normal Incapacitation due to abnormal movements: None, normal Patient's awareness of abnormal movements (rate only patient's report): No Awareness, Dental Status Current problems with teeth and/or dentures?: No Does patient usually wear dentures?: No  CIWA:    COWS:     Musculoskeletal: Strength & Muscle Tone: within normal limits Gait & Station: normal Patient leans: Right  Psychiatric Specialty Exam: Physical Exam  Constitutional: She is oriented to person, place, and time. She appears well-developed and well-nourished.  HENT:  Head: Normocephalic.  Neck: Normal range of motion.  Respiratory: Effort normal.  Musculoskeletal: Normal range of motion.  Neurological: She is alert and oriented to person, place, and time.  Psychiatric: She has a normal mood and affect. Her speech is normal and behavior is normal. Judgment and thought content normal. Cognition and memory are normal.    Review of Systems  All other systems reviewed and are negative.   Blood pressure 92/60, pulse 85, temperature 98.4 F (36.9 C), temperature source Oral, resp. rate 16.There is no height or weight on file to calculate BMI.  General Appearance: Casual  Eye Contact:  Good  Speech:  Clear and Coherent and Normal Rate  Volume:  Normal  Mood:  Euthymic  Affect:  Congruent  Thought Process:  Coherent and Descriptions of Associations: Intact  Orientation:  Full (Time, Place, and Person)  Thought Content:  WDL and Logical  Suicidal Thoughts:  No  Homicidal Thoughts:  No  Memory:  Immediate;   Good Recent;   Good Remote;   Good  Judgement:  Good  Insight:  Good  Psychomotor Activity:  Normal  Concentration:  Concentration: Good and Attention Span: Good  Recall:  Good  Fund of Knowledge:  Good  Language:  Good  Akathisia:  No  Handed:  Right  AIMS (if indicated):     Assets:  Intimacy Leisure Time Physical Health Resilience Social Support  ADL's:  Intact   Cognition:  WNL  Sleep:  Number of Hours: 6.25     Have you used any form of tobacco in the last 30 days? (Cigarettes, Smokeless Tobacco, Cigars, and/or Pipes): No  Has this patient used any form of tobacco in the last 30 days? (Cigarettes, Smokeless Tobacco, Cigars, and/or Pipes) No, N/A  Blood Alcohol level:  Lab Results  Component Value Date   ETH 109 (H) 10/27/2017   ETH 159 (H) 51/76/1607    Metabolic Disorder Labs:  Lab Results  Component Value Date   HGBA1C 13.5 (H) 10/27/2017   MPG 340.75 10/27/2017   No results found for: PROLACTIN Lab Results  Component Value Date   CHOL 217 (H) 12/01/2008   TRIG 160 (H) 12/01/2008   HDL 66 12/01/2008   CHOLHDL 3.3 Ratio 12/01/2008   VLDL 32 12/01/2008   LDLCALC 119 (H) 12/01/2008   LDLCALC 120 (H) 11/26/2007    See Psychiatric Specialty Exam and Suicide Risk Assessment completed by Attending Physician prior to discharge.  Discharge destination:  Home  Is patient on multiple antipsychotic therapies at discharge:  No   Has Patient had three or more failed trials of antipsychotic monotherapy by history:  No  Recommended Plan for Multiple Antipsychotic Therapies: NA  Discharge Instructions    Diet - low sodium heart healthy   Complete by:  As directed    Discharge instructions   Complete by:  As directed    Follow-up with outpatient appointment this week   Increase activity slowly   Complete by:  As directed      Allergies as of 11/04/2017   No Known Allergies     Medication List    TAKE these medications     Indication  citalopram 20 MG tablet Commonly known as:  CELEXA Take 1 tablet (20 mg total) by mouth daily. Start taking on:  11/05/2017 What changed:    medication strength  how much to take  Indication:  anxiety/depression   glimepiride 2 MG tablet Commonly known as:  AMARYL Take 1 tablet (2 mg total) daily before breakfast by mouth.  Indication:  Type 2 Diabetes   glucose blood test strip Use  to check blood sugar four (4) times per day, before meals and at bedtime  Indication:  glucose monitoring   hydrOXYzine 25 MG tablet Commonly known as:  ATARAX/VISTARIL Take 1 tablet (25 mg total) by mouth 3 (three) times daily as needed for anxiety.  Indication:  Feeling Anxious   insulin aspart 100 UNIT/ML injection Commonly known as:  novoLOG 0-15 Units, Subcutaneous, 3 times daily with meals CBG < 70: implement hypoglycemia protocol CBG 70 - 120: 0 units CBG 121 - 150: 2 units CBG 151 - 200: 3 units CBG 201 - 250: 5 units CBG 251 - 300: 8 units CBG 301 -  350: 11 units CBG 351 - 400: 15 units CBG > 400: call MD and obtain STAT lab verification  Indication:  diabetes   insulin glargine 100 UNIT/ML injection Commonly known as:  LANTUS Inject 0.06 mLs (6 Units total) into the skin at bedtime.  Indication:  diabetes   Insulin Syringe-Needle U-100 31G X 15/64" 0.3 ML Misc Use twice daily to administer insulin  Indication:  inslin administration   RELION PRIME MONITOR Devi Please use device to check blood sugars four (4) times per day, before meals and at bedtime.  Indication:  glucose monitoring   RELION ULTRA THIN LANCETS 30G Misc Use to check blood sugars four (4) times per day, before meals and at bedtime  Indication:  glucose monitoring   traZODone 100 MG tablet Commonly known as:  DESYREL Take 1 tablet (100 mg total) by mouth at bedtime.  Indication:  Trouble Sleeping      Follow-up Information    [facility] Follow up.   Why:  Patient's husband is going to make the needed follow-up appointment for no later than Monday 11/12/2017.  He declines assistance with this due to his work schedule and transportation issues that he must arrange. Contact information: [place] [address] [phone]          Follow-up recommendations:  Activity:  as tolerated Diet:  heart healhty diet along with diabetes carb  control  Comments:  Follow-up with behavioral health appointment this week, continue taking medications as prescribed.  SignedWaylan Boga, NP 11/04/2017, 12:07 PM

## 2017-11-04 NOTE — [facility] Suicide Risk Assessment (Signed)
[facility] Discharge Suicide Risk Assessment   Principal Problem: MDD Discharge Diagnoses: MDD Patient Active Problem List   Diagnosis Date Noted  . MDD (major depressive disorder) [F32.9] 11/01/2017  . Intentional metformin overdose ([place]) [T38.3X2A] 10/27/2017  . Adjustment disorder with mixed anxiety and depressed mood [F43.23] 10/27/2017  . Financial difficulties [Z59.8] 01/06/2014  . Cough [R05] 01/06/2014  . DYSLIPIDEMIA [E78.5] 12/10/2007  . Diabetes type 2, uncontrolled ([place]) [E11.65] 08/07/2006    Total Time spent with patient: 45 minutes  Musculoskeletal: Strength & Muscle Tone: within normal limits Gait & Station: normal Patient leans: N/A  Psychiatric Specialty Exam: Review of Systems  Constitutional: Negative.   HENT: Negative.   Eyes: Negative.   Respiratory: Negative.   Cardiovascular: Negative.   Gastrointestinal: Negative.   Genitourinary: Negative.   Musculoskeletal: Negative.   Skin: Negative.   Neurological: Negative.   Endo/Heme/Allergies: Negative.   Psychiatric/Behavioral: Negative for depression, hallucinations, memory loss, substance abuse and suicidal ideas. The patient is not nervous/anxious and does not have insomnia.     Blood pressure 92/60, pulse 85, temperature 98.4 F (36.9 C), temperature source Oral, resp. rate 16.There is no height or weight on file to calculate BMI.  General Appearance: Neatly dressed, pleasant, engaging well and cooperative. Appropriate behavior. Not in any distress. Good relatedness. Not internally stimulated  Eye Contact::  Good  Speech:  Spontaneous, normal prosody. Normal tone and rate.   Volume:  Normal  Mood:  Euthymic  Affect:  Appropriate and Full Range  Thought Process:  Linear  Orientation:  Full (Time, Place, and Person)  Thought Content:  Future oriented. No delusional theme. No preoccupation with violent thoughts. No negative ruminations. No obsession.  No hallucination in any modality.   Suicidal Thoughts:   No  Homicidal Thoughts:  No  Memory: WNL  Judgement:  Good  Insight:  Good  Psychomotor Activity:  Normal  Concentration:  Good  Recall:  Good  Fund of Knowledge:Good  Language: Good  Akathisia:  Negative  Handed:    AIMS (if indicated):     Assets:  Communication Skills Desire for Improvement Financial Resources/Insurance Housing Intimacy Resilience  Sleep:  Number of Hours: 6.25  Cognition: WNL  ADL's:  Intact   Clinical Assessment::   37y.o Hispanic female, married, lives with her family. Background history ofDM. No past history of mental illness. Presented to the [facility] emergency services. She overdosed on seven pills of Metformin. Her daughter called 911. Patient was managed medically and stepped down to our unit. Overdose was reported to be in context of an argument with her husband.No substance use.  Seen today. Reports that she is in good spirits. Not feeling depressed. Reports normal energy and interest. Has been maintaining normal biological functions. She is able to think clearly. She is able to focus on task. Her thoughts are not crowded or racing. No evidence of mania. No hallucination in any modality. She is not making any delusional statement. No passivity of will/thought. She is fully in touch with reality. No thoughts of suicide. No thoughts of homicide. No violent thoughts. No overwhelming anxiety. No access to weapons.  Denies any new stressors at home. No financial constraints. No relational difficulties. No legal issues.   SW met with her husband yesterday. No concerns about dangerousness. Family wants her home soon.  Nursing staff reports that patient has been appropriate on the unit. Patient has been interacting well with peers. No behavioral issues. Patient has not voiced any suicidal thoughts. Patient has not been  observed to be internally stimulated. Patient has been adherent with treatment recommendations. Patient has been tolerating their medication well.    Patient was discussed at team. Team members feels that patient is back to her baseline level of function. Team agrees with plan to discharge patient today.   Demographic Factors:  NA  Loss Factors: NA  Historical Factors: NA  Risk Reduction Factors:   Sense of responsibility to family, Religious beliefs about death, Living with another person, especially a relative, Positive social support, Positive therapeutic relationship and Positive coping skills or problem solving skills  Continued Clinical Symptoms:  As above   Cognitive Features That Contribute To Risk:  None    Suicide Risk:  Minimal.  Patient is not having any thoughts of suicide at this time. Modifiable risk factors targeted during this admission includes depression and insomnia. Demographical and historical risk factors cannot be modified. Patient is now engaging well. Patient is reliable and is future oriented. We have buffered patient's support structures. At this point, patient is at low risk of suicide. Patient is aware of the effects of psychoactive substances on decision making process. Patient has been provided with emergency contacts. Patient acknowledges to use resources provided if unforseen circumstances changes their current risk stratification.    Follow-up Information    [facility] Follow up.   Why:  Patient's husband is going to make the needed follow-up appointment for no later than Monday 11/12/2017.  He declines assistance with this due to his work schedule and transportation issues that he must arrange. Contact information: [place] [id number] [phone]          Plan Of Care/Follow-up recommendations:  1. Continue current psychotropic medications 2. Mental health follow up as arranged.  3. Discharge in care of her family 4. Provided limited quantity of prescriptions  [place], MD 11/04/2017, 9:38 AM

## 2017-11-19 ENCOUNTER — Ambulatory Visit: Payer: Self-pay | Admitting: Physician Assistant

## 2017-11-19 ENCOUNTER — Encounter: Payer: Self-pay | Admitting: Physician Assistant

## 2017-11-19 VITALS — BP 110/72 | HR 68 | Temp 97.8°F | Resp 16 | Ht 63.0 in | Wt 115.0 lb

## 2017-11-19 DIAGNOSIS — E1165 Type 2 diabetes mellitus with hyperglycemia: Secondary | ICD-10-CM

## 2017-11-19 DIAGNOSIS — IMO0001 Reserved for inherently not codable concepts without codable children: Secondary | ICD-10-CM

## 2017-11-19 LAB — GLUCOSE, POCT (MANUAL RESULT ENTRY): POC Glucose: 408 mg/dl — AB (ref 70–99)

## 2017-11-19 LAB — POCT GLYCOSYLATED HEMOGLOBIN (HGB A1C): HEMOGLOBIN A1C: 14

## 2017-11-19 MED ORDER — GLIMEPIRIDE 2 MG PO TABS: 2.0000 mg | ORAL_TABLET | Freq: Every day | ORAL | 3 refills | Status: DC

## 2017-11-19 MED ORDER — METFORMIN HCL 500 MG PO TABS: 500.0000 mg | ORAL_TABLET | Freq: Two times a day (BID) | ORAL | 3 refills | Status: DC

## 2017-11-19 NOTE — Progress Notes (Signed)
[facility] [address], [place] [id number] [phone]  Date:  11/19/2017   Name:  Shelby Brown   DOB:  08/20/1981   MRN:  [id number]  PCP:  Patient, No Pcp Per    History of Present Illness:  Shelby Brown is a 37 y.o. female patient who presents to PCP with  Chief Complaint  Patient presents with  . Diabetes    management     She is not checking her blood sugar at this time.   She takes one in the mroning and one at night.  She is also compliant on the glimeperide.  She has no side effects to the medication.   No fatigue, dizziness, nausea or dizziness She has no autoimmune illnesses in her family.  She notes that her glucose stays high no matter what she is taking.   She is not practicing a diabetic diet, but anything which include tortillas, etc.   Patient was hospitalized about 2 weeks ago for metformin dose.  She reports that she did not take just 1 tablet, as reported to the hospital.  She notes that she had told them this but she took 7.  She states that she was trying to sleep.  I asked why she thought this, but she states that she thought it would make her sleepy.  She denies self-harm.  She denies depression.  She denies SI.  She reports that she does not have anxiety or depression.  Reports that she is sleeping fine without any medications.  She has not been taking the trazodone daily.  She does not take the Celexa.   Translator utilized via [employer] during visit.  She states that she feels safe in the home.   Patient Active Problem List   Diagnosis Date Noted  . MDD (major depressive disorder) 11/01/2017  . Intentional metformin overdose (HCC) 10/27/2017  . Adjustment disorder with mixed anxiety and depressed mood 10/27/2017  . Financial difficulties 01/06/2014  . Cough 01/06/2014  . DYSLIPIDEMIA 12/10/2007  . Diabetes type 2, uncontrolled (HCC) 08/07/2006    Past Medical History:  Diagnosis Date  . Diabetes mellitus without  complication [facility](HCC)     Past Surgical History:  Procedure Laterality Date  . CESAREAN SECTION    . TUBAL LIGATION      Social History   Tobacco Use  . Smoking status: Never Smoker  . Smokeless tobacco: Never Used  Substance Use Topics  . Alcohol use: No  . Drug use: No    Family History  Problem Relation Age of Onset  . Cancer Father   . Diabetes Sister   . Diabetes Sister     No Known Allergies  Medication list has been reviewed and updated.  Current Outpatient Medications on File Prior to Visit  Medication Sig Dispense Refill  . citalopram (CELEXA) 20 MG tablet Take 1 tablet (20 mg total) by mouth daily. 30 tablet 0  . glimepiride (AMARYL) 2 MG tablet Take 1 tablet (2 mg total) daily before breakfast by mouth. 30 tablet 3  . hydrOXYzine (ATARAX/VISTARIL) 25 MG tablet Take 1 tablet (25 mg total) by mouth 3 (three) times daily as needed for anxiety. 30 tablet 0  . metFORMIN (GLUCOPHAGE) 500 MG tablet Take by mouth 2 (two) times daily with a meal.    . traZODone (DESYREL) 100 MG tablet Take 1 tablet (100 mg total) by mouth at bedtime. 30 tablet 0  . Blood Glucose Monitoring Suppl (RELION PRIME MONITOR) DEVI Please use  device to check blood sugars four (4) times per day, before meals and at bedtime. (Patient not taking: Reported on 11/19/2017) 1 Device 0  . glucose blood test strip Use to check blood sugar four (4) times per day, before meals and at bedtime 100 each 12  . insulin aspart (NOVOLOG) 100 UNIT/ML injection 0-15 Units, Subcutaneous, 3 times daily with meals CBG < 70: implement hypoglycemia protocol CBG 70 - 120: 0 units CBG 121 - 150: 2 units CBG 151 - 200: 3 units CBG 201 - 250: 5 units CBG 251 - 300: 8 units CBG 301 - 350: 11 units CBG 351 - 400: 15 units CBG > 400: call MD and obtain STAT lab verification (Patient not taking: Reported on 11/19/2017) 10 mL 11  . insulin glargine (LANTUS) 100 UNIT/ML injection Inject 0.06 mLs (6 Units total) into the skin at  bedtime. (Patient not taking: Reported on 11/19/2017) 10 mL 11  . Insulin Syringe-Needle U-100 31G X 15/64" 0.3 ML MISC Use twice daily to administer insulin (Patient not taking: Reported on 08/06/2017) 100 each 1  . RELION ULTRA THIN LANCETS 30G MISC Use to check blood sugars four (4) times per day, before meals and at bedtime (Patient not taking: Reported on 11/19/2017) 100 each 1   No current facility-administered medications on file prior to visit.     ROS ROS otherwise unremarkable unless listed above.  Physical Examination: BP 110/72   Pulse 68   Temp 97.8 F (36.6 C) (Oral)   Resp 16   Ht 5\' 3"  (1.6 m)   Wt 115 lb (52.2 kg)   SpO2 98%   BMI 20.37 kg/m  Ideal Body Weight: Weight in (lb) to have BMI = 25: 140.8  Physical Exam  Constitutional: She is oriented to person, place, and time. She appears well-developed and well-nourished. No distress.  HENT:  Head: Normocephalic and atraumatic.  Right Ear: External ear normal.  Left Ear: External ear normal.  Eyes: Conjunctivae and EOM are normal. Pupils are equal, round, and reactive to light.  Cardiovascular: Normal rate.  Pulmonary/Chest: Effort normal. No respiratory distress.  Neurological: She is alert and oriented to person, place, and time.  Skin: She is not diaphoretic.  Psychiatric: She has a normal mood and affect. Her behavior is normal. Thought content normal. She expresses no suicidal plans and no homicidal plans.    Results for orders placed or performed in visit on 11/19/17  POCT glycosylated hemoglobin (Hb A1C)  Result Value Ref Range   Hemoglobin A1C 14.0   POCT glucose (manual entry)  Result Value Ref Range   POC Glucose 408 (A) 70 - 99 mg/dl     Assessment and Plan: Shelby Brown is a 37 y.o. female who is here today for cc of  Chief Complaint  Patient presents with  . Diabetes    management  despite medication use, her diabetes has not changed.  Her blood sugar started to trend down with the  use of insulin.  I will assess her glutamic/insulin/C-peptide at this time.  Advised to take medications compliantly at this time Discussed the role of trazodone.  She is not taking the Celexa.   Advised to continue the trazodone. Labs pending.   Assessment of self-harm assessed, and from her reports appears to not have this difficulty at this time.  Uncontrolled type 2 diabetes mellitus with hyperglycemia (HCC) - Plan: POCT glycosylated hemoglobin (Hb A1C), POCT glucose (manual entry), Basic metabolic panel, Glutamic acid decarboxylase auto abs, Insulin  and C-Peptide, glimepiride (AMARYL) 2 MG tablet, metFORMIN (GLUCOPHAGE) 500 MG tablet  Diabetes mellitus type 2, uncontrolled, without complications (HCC) - Plan: glimepiride (AMARYL) 2 MG tablet, metFORMIN (GLUCOPHAGE) 500 MG tablet  Trena Platt, PA-C [facility] 3/4/20193:31 PM

## 2017-11-19 NOTE — Patient Instructions (Addendum)
I am filling your blood sugar medications. You do not have to fill until I follow back with you. Please follow the nutrition diet below.   Diabetes Mellitus and Nutrition When you have diabetes (diabetes mellitus), it is very important to have healthy eating habits because your blood sugar (glucose) levels are greatly affected by what you eat and drink. Eating healthy foods in the appropriate amounts, at about the same times every day, can help you:  Control your blood glucose.  Lower your risk of heart disease.  Improve your blood pressure.  Reach or maintain a healthy weight.  Every person with diabetes is different, and each person has different needs for a meal plan. Your health care provider may recommend that you work with a diet and nutrition specialist (dietitian) to make a meal plan that is best for you. Your meal plan may vary depending on factors such as:  The calories you need.  The medicines you take.  Your weight.  Your blood glucose, blood pressure, and cholesterol levels.  Your activity level.  Other health conditions you have, such as heart or kidney disease.  How do carbohydrates affect me? Carbohydrates affect your blood glucose level more than any other type of food. Eating carbohydrates naturally increases the amount of glucose in your blood. Carbohydrate counting is a method for keeping track of how many carbohydrates you eat. Counting carbohydrates is important to keep your blood glucose at a healthy level, especially if you use insulin or take certain oral diabetes medicines. It is important to know how many carbohydrates you can safely have in each meal. This is different for every person. Your dietitian can help you calculate how many carbohydrates you should have at each meal and for snack. Foods that contain carbohydrates include:  Bread, cereal, rice, pasta, and crackers.  Potatoes and corn.  Peas, beans, and lentils.  Milk and yogurt.  Fruit and  juice.  Desserts, such as cakes, cookies, ice cream, and candy.  How does alcohol affect me? Alcohol can cause a sudden decrease in blood glucose (hypoglycemia), especially if you use insulin or take certain oral diabetes medicines. Hypoglycemia can be a life-threatening condition. Symptoms of hypoglycemia (sleepiness, dizziness, and confusion) are similar to symptoms of having too much alcohol. If your health care provider says that alcohol is safe for you, follow these guidelines:  Limit alcohol intake to no more than 1 drink per day for nonpregnant women and 2 drinks per day for men. One drink equals 12 oz of beer, 5 oz of wine, or 1 oz of hard liquor.  Do not drink on an empty stomach.  Keep yourself hydrated with water, diet soda, or unsweetened iced tea.  Keep in mind that regular soda, juice, and other mixers may contain a lot of sugar and must be counted as carbohydrates.  What are tips for following this plan? Reading food labels  Start by checking the serving size on the label. The amount of calories, carbohydrates, fats, and other nutrients listed on the label are based on one serving of the food. Many foods contain more than one serving per package.  Check the total grams (g) of carbohydrates in one serving. You can calculate the number of servings of carbohydrates in one serving by dividing the total carbohydrates by 15. For example, if a food has 30 g of total carbohydrates, it would be equal to 2 servings of carbohydrates.  Check the number of grams (g) of saturated and trans fats in  one serving. Choose foods that have low or no amount of these fats.  Check the number of milligrams (mg) of sodium in one serving. Most people should limit total sodium intake to less than 2,300 mg per day.  Always check the nutrition information of foods labeled as "low-fat" or "nonfat". These foods may be higher in added sugar or refined carbohydrates and should be avoided.  Talk to your  dietitian to identify your daily goals for nutrients listed on the label. Shopping  Avoid buying canned, premade, or processed foods. These foods tend to be high in fat, sodium, and added sugar.  Shop around the outside edge of the grocery store. This includes fresh fruits and vegetables, bulk grains, fresh meats, and fresh dairy. Cooking  Use low-heat cooking methods, such as baking, instead of high-heat cooking methods like deep frying.  Cook using healthy oils, such as olive, canola, or sunflower oil.  Avoid cooking with butter, cream, or high-fat meats. Meal planning  Eat meals and snacks regularly, preferably at the same times every day. Avoid going long periods of time without eating.  Eat foods high in fiber, such as fresh fruits, vegetables, beans, and whole grains. Talk to your dietitian about how many servings of carbohydrates you can eat at each meal.  Eat 4-6 ounces of lean protein each day, such as lean meat, chicken, fish, eggs, or tofu. 1 ounce is equal to 1 ounce of meat, chicken, or fish, 1 egg, or 1/4 cup of tofu.  Eat some foods each day that contain healthy fats, such as avocado, nuts, seeds, and fish. Lifestyle   Check your blood glucose regularly.  Exercise at least 30 minutes 5 or more days each week, or as told by your health care provider.  Take medicines as told by your health care provider.  Do not use any products that contain nicotine or tobacco, such as cigarettes and e-cigarettes. If you need help quitting, ask your health care provider.  Work with a Veterinary surgeon or diabetes educator to identify strategies to manage stress and any emotional and social challenges. What are some questions to ask my health care provider?  Do I need to meet with a diabetes educator?  Do I need to meet with a dietitian?  What number can I call if I have questions?  When are the best times to check my blood glucose? Where to find more information:  American Diabetes  Association: diabetes.org/food-and-fitness/food  Academy of Nutrition and Dietetics: https://www.vargas.com/  [employer] of Diabetes and Digestive and Kidney Diseases (NIH): FindJewelers.cz Summary  A healthy meal plan will help you control your blood glucose and maintain a healthy lifestyle.  Working with a diet and nutrition specialist (dietitian) can help you make a meal plan that is best for you.  Keep in mind that carbohydrates and alcohol have immediate effects on your blood glucose levels. It is important to count carbohydrates and to use alcohol carefully. This information is not intended to replace advice given to you by your health care provider. Make sure you discuss any questions you have with your health care provider. Document Released: 06/01/2005 Document Revised: 10/09/2016 Document Reviewed: 10/09/2016 Elsevier Interactive Patient Education  2018 [employer].      IF you received an x-ray today, you will receive an invoice from [facility]. Please contact [facility] at [phone] with questions or concerns regarding your invoice.   IF you received labwork today, you will receive an invoice from [facility]. Please contact [facility] at  [phone] with questions or concerns regarding your invoice.   Our billing staff will not be able to assist you with questions regarding bills from these companies.  You will be contacted with the lab results as soon as they are available. The fastest way to get your results is to activate your My Chart account. Instructions are located on the last page of this paperwork. If you have not heard from [place] regarding the results in 2 weeks, please contact this office.

## 2017-11-21 LAB — BASIC METABOLIC PANEL
BUN/Creatinine Ratio: 16 (ref 9–23)
BUN: 11 mg/dL (ref 6–20)
CALCIUM: 9.3 mg/dL (ref 8.7–10.2)
CHLORIDE: 98 mmol/L (ref 96–106)
CO2: 22 mmol/L (ref 20–29)
Creatinine, Ser: 0.69 mg/dL (ref 0.57–1.00)
GFR calc non Af Amer: 111 mL/min/{1.73_m2} (ref >59)
GFR, EST AFRICAN AMERICAN: 128 mL/min/{1.73_m2} (ref >59)
GLUCOSE: 403 mg/dL — AB (ref 65–99)
POTASSIUM: 4.9 mmol/L (ref 3.5–5.2)
Sodium: 137 mmol/L (ref 134–144)

## 2017-11-21 LAB — INSULIN AND C-PEPTIDE, SERUM
C-Peptide: 1.2 ng/mL (ref 1.1–4.4)
INSULIN: 3.1 u[IU]/mL (ref 2.6–24.9)

## 2017-11-21 LAB — GLUTAMIC ACID DECARBOXYLASE AUTO ABS

## 2017-11-26 ENCOUNTER — Other Ambulatory Visit: Payer: Self-pay | Admitting: Physician Assistant

## 2017-11-26 DIAGNOSIS — E1165 Type 2 diabetes mellitus with hyperglycemia: Secondary | ICD-10-CM

## 2017-12-19 ENCOUNTER — Encounter: Payer: Self-pay | Admitting: Physician Assistant

## 2018-03-12 ENCOUNTER — Encounter: Payer: Self-pay | Admitting: Emergency Medicine

## 2018-03-12 ENCOUNTER — Other Ambulatory Visit: Payer: Self-pay

## 2018-03-12 ENCOUNTER — Ambulatory Visit: Payer: Self-pay | Admitting: Emergency Medicine

## 2018-03-12 VITALS — BP 108/68 | HR 92 | Temp 98.5°F | Resp 16 | Ht 63.0 in | Wt 116.0 lb

## 2018-03-12 DIAGNOSIS — R1013 Epigastric pain: Secondary | ICD-10-CM

## 2018-03-12 DIAGNOSIS — G8929 Other chronic pain: Secondary | ICD-10-CM | POA: Insufficient documentation

## 2018-03-12 DIAGNOSIS — E1165 Type 2 diabetes mellitus with hyperglycemia: Secondary | ICD-10-CM

## 2018-03-12 LAB — POCT GLYCOSYLATED HEMOGLOBIN (HGB A1C): HEMOGLOBIN A1C: 11.1 % — AB (ref 4.0–5.6)

## 2018-03-12 LAB — GLUCOSE, POCT (MANUAL RESULT ENTRY): POC Glucose: 288 mg/dl — AB (ref 70–99)

## 2018-03-12 MED ORDER — METFORMIN HCL 1000 MG PO TABS: 1000.0000 mg | ORAL_TABLET | Freq: Two times a day (BID) | ORAL | 3 refills | Status: DC

## 2018-03-12 MED ORDER — GLIMEPIRIDE 4 MG PO TABS: 4.0000 mg | ORAL_TABLET | Freq: Every day | ORAL | 1 refills | Status: DC

## 2018-03-12 NOTE — Assessment & Plan Note (Signed)
Chronic abdominal pain very likely related to long-term uncontrolled diabetes.  Possible diabetic gastroparesis.  Advised on nutrition and as needed use of Mylanta and or H2 blockers.  Diabetic control is the key for treatment of this chronic condition.

## 2018-03-12 NOTE — Assessment & Plan Note (Signed)
Uncontrolled diabetes with hyperglycemia, elevated hemoglobin A1c at 11.1.  Patient noncompliant with diet and medications.  Advised to increase metformin to 1000 mg twice a day and increase Amaryl to 4 mg daily with breakfast.  Advised about the importance of nutrition and physical activity.  We will follow-up in 3 months.

## 2018-03-12 NOTE — Patient Instructions (Addendum)
   IF you received an x-ray today, you will receive an invoice from [facility]. Please contact [facility] at [phone] with questions or concerns regarding your invoice.   IF you received labwork today, you will receive an invoice from [facility]. Please contact [facility] at [phone] with questions or concerns regarding your invoice.   Our billing staff will not be able to assist you with questions regarding bills from these companies.  You will be contacted with the lab results as soon as they are available. The fastest way to get your results is to activate your My Chart account. Instructions are located on the last page of this paperwork. If you have not heard from us regarding the results in 2 weeks, please contact this office.     Diabetes mellitus y nutricin Diabetes Mellitus and Nutrition Si sufre de diabetes (diabetes mellitus), es muy importante tener hbitos alimenticios saludables debido a que sus niveles de azcar en la sangre (glucosa) se ven afectados en gran medida por lo que come y bebe. Comer alimentos saludables en las cantidades adecuadas, aproximadamente a la misma hora todos los das, lo ayudar a:  Controlar la glucemia.  Disminuir el riesgo de sufrir una enfermedad cardaca.  Mejorar la presin arterial.  Alcanzar o mantener un peso saludable.  Todas las personas que sufren de diabetes son diferentes y cada una tiene necesidades diferentes en cuanto a un plan de alimentacin. El mdico puede recomendarle que trabaje con un especialista en dietas y nutricin (nutricionista) para elaborar el mejor plan para usted. Su plan de alimentacin puede variar segn factores como:  Las caloras que necesita.  Los medicamentos que toma.  Su peso.  Sus niveles de glucemia, presin arterial y colesterol.  Su nivel de actividad.  Otras afecciones que tenga, como enfermedades cardacas o renales.  Cmo me afectan los carbohidratos? Los  carbohidratos afectan el nivel de glucemia ms que cualquier otro tipo de alimento. La ingesta de carbohidratos naturalmente aumenta la cantidad glucosa en la sangre. El recuento de carbohidratos es un mtodo destinado a llevar un registro de la cantidad de carbohidratos que se ingieren. El recuento de carbohidratos es importante para mantener la glucemia a un nivel saludable, en especial si utiliza insulina o toma determinados medicamentos por va oral para la diabetes. Es importante saber la cantidad de carbohidratos que se pueden ingerir en cada comida sin correr ningn riesgo. Esto es diferente en cada persona. El nutricionista puede ayudarlo a calcular la cantidad de carbohidratos que debe ingerir en cada comida y colacin. Los alimentos que contienen carbohidratos incluyen:  Pan, cereal, arroz, pasta y galletas.  Papas y maz.  Guisantes, frijoles y lentejas.  Leche y yogur.  Frutas y jugo.  Postres, como pasteles, galletitas, helado y caramelos.  Cmo me afecta el alcohol? El alcohol puede provocar disminuciones sbitas de la glucemia (hipoglucemia), en especial si utiliza insulina o toma determinados medicamentos por va oral para la diabetes. La hipoglucemia es una afeccin potencialmente mortal. Los sntomas de la hipoglucemia (somnolencia, mareos y confusin) son similares a los sntomas de haber consumido demasiado alcohol. Si el mdico afirma que el alcohol es seguro para usted, siga estas pautas:  Limite el consumo de alcohol a no ms de 1 medida por da si es mujer y no est embarazada, y a 2 medidas si es hombre. Una medida equivale a 12oz (355ml) de cerveza, 5oz (148ml) de vino o 1oz (44ml) de bebidas de alta graduacin alcohlica.  No beba con el estmago   vaco.  Mantngase hidratado con agua, gaseosas dietticas o t helado sin azcar.  Tenga en cuenta que las gaseosas comunes, los jugos y otros refrescos pueden contener mucha azcar y se deben contar como  carbohidratos.  Consejos para seguir este plan Leer las etiquetas de los alimentos  Comience por controlar el tamao de la porcin en la etiqueta. La cantidad de caloras, carbohidratos, grasas y otros nutrientes mencionados en la etiqueta se basan en una porcin del alimento. Muchos alimentos contienen ms de una porcin por envase.  Verifique la cantidad total de gramos (g) de carbohidratos totales en una porcin. Puede calcular la cantidad de porciones de carbohidratos al dividir el total de carbohidratos por 15. Por ejemplo, si un alimento posee un total de 30g de carbohidratos, equivale a 2 porciones de carbohidratos.  Verifique la cantidad de gramos (g) de grasas saturadas y grasas trans en una porcin. Escoja alimentos que no contengan grasa o que tengan un bajo contenido.  Controle la cantidad de miligramos (mg) de sodio en una porcin. La mayora de las personas deben limitar la ingesta de sodio total a menos de 2300mg por da.  Siempre consulte la informacin nutricional de los alimentos etiquetados como "con bajo contenido de grasa" o "sin grasa". Estos alimentos pueden ser ms altos en azcar agregada o en carbohidratos refinados y deben evitarse.  Hable con el nutricionista para identificar sus objetivos diarios en cuanto a los nutrientes mencionados en la etiqueta. De compras  Evite comprar alimentos procesados, enlatados o prehechos. Estos alimentos tienden a tener mayor cantidad de grasa, sodio y azcar agregada.  Compre en la zona exterior de la tienda de comestibles. Esta incluye frutas y vegetales frescos, granos a granel, carnes frescas y productos lcteos frescos. Coccin  Utilice mtodos de coccin a baja temperatura, como hornear, en lugar de mtodos de coccin a alta temperatura, como frer en abundante aceite.  Cocine con aceites saludables, como el aceite de oliva, canola o girasol.  Evite cocinar con manteca, crema o carnes con alto contenido de  grasa. Planificacin de las comidas  Consuma las comidas y las colaciones de forma regular, preferentemente a la misma hora todos los das. Evite pasar largos perodos de tiempo sin comer.  Consuma alimentos ricos en fibra, como frutas frescas, verduras, frijoles y cereales integrales. Consulte al nutricionista sobre cuntas porciones de carbohidratos puede consumir en cada comida.  Consuma entre 4 y 6 onzas de protenas magras por da, como carnes magras, pollo, pescado, huevos o tofu. 1 onza equivale a 1 onza de carne, pollo o pescado, 1 huevo, o 1/4 taza de tofu.  Coma algunos alimentos por da que contengan grasas saludables, como aguacates, frutos secos, semillas y pescado. Estilo de vida   Controle su nivel de glucemia con regularidad.  Haga ejercicio al menos 30minutos, 5das o ms por semana, o como se lo haya indicado el mdico.  Tome los medicamentos como se lo haya indicado el mdico.  No consuma ningn producto que contenga nicotina o tabaco, como cigarrillos y cigarrillos electrnicos. Si necesita ayuda para dejar de fumar, consulte al mdico.  Trabaje con un asesor o instructor en diabetes para identificar estrategias para controlar el estrs y cualquier desafo emocional y social. Cules son algunas de las preguntas que puedo hacerle a mi mdico?  Es necesario que me rena con un instructor en diabetes?  Es necesario que me rena con un nutricionista?  A qu nmero puedo llamar si tengo preguntas?  Cules son los mejores momentos para   controlar la glucemia? Dnde encontrar ms informacin:  Asociacin Americana de la Diabetes (American Diabetes Association): diabetes.org/food-and-fitness/food  Academia de Nutricin y Diettica (Academy of Nutrition and Dietetics): www.eatright.org/resources/health/diseases-and-conditions/diabetes  Instituto Nacional de la Diabetes y las Enfermedades Digestivas y Renales (National Institute of Diabetes and Digestive and  Kidney Diseases) (Institutos Nacionales de Salud, NIH): www.niddk.nih.gov/health-information/diabetes/overview/diet-eating-physical-activity Resumen  Un plan de alimentacin saludable lo ayudar a controlar la glucemia y mantener un estilo de vida saludable.  Trabajar con un especialista en dietas y nutricin (nutricionista) puede ayudarlo a elaborar el mejor plan de alimentacin para usted.  Tenga en cuenta que los carbohidratos y el alcohol tienen efectos inmediatos en sus niveles de glucemia. Es importante contar los carbohidratos y consumir alcohol con prudencia. Esta informacin no tiene como fin reemplazar el consejo del mdico. Asegrese de hacerle al mdico cualquier pregunta que tenga. Document Released: 12/12/2007 Document Revised: 12/25/2016 Document Reviewed: 12/25/2016 Elsevier Interactive Patient Education  2018 Elsevier Inc.  

## 2018-03-12 NOTE — Progress Notes (Signed)
Shelby Brown 37 y.o.   Chief Complaint  Patient presents with  . Diabetes    follow up  . Medication Refill    HISTORY OF PRESENT ILLNESS: This is a 37 y.o. female with a history of diabetes here for follow-up and medication refill.  Has been off Amaryl for 3 days.  Takes metformin 500 mg twice a day.  Noncompliant with medications and diet.  Long history of diabetes since pregnancy.  States her glucose are always high.  Her diabetes has been difficult to control blood but patient at the same time has been very noncompliant.  Has a history of chronic abdominal pain, mostly epigastric area.  With recent flareup.  Still able to eat and drink.  Denies nausea or vomiting.  Denies fever or chills.  Denies melena or rectal bleeding.  HPI   Prior to Admission medications   Medication Sig Start Date End Date Taking? Authorizing Provider  glimepiride (AMARYL) 2 MG tablet Take 1 tablet (2 mg total) by mouth daily before breakfast. 11/19/17  Yes English, Stephanie D, PA  metFORMIN (GLUCOPHAGE) 500 MG tablet Take 1 tablet (500 mg total) by mouth 2 (two) times daily with a meal. 11/19/17  Yes English, Stephanie D, PA  Blood Glucose Monitoring Suppl (RELION PRIME MONITOR) DEVI Please use device to check blood sugars four (4) times per day, before meals and at bedtime. Patient not taking: Reported on 11/19/2017 01/06/14   Cater, Luis Abed, MD  citalopram (CELEXA) 20 MG tablet Take 1 tablet (20 mg total) by mouth daily. Patient not taking: Reported on 03/12/2018 11/05/17   Charm Rings, NP  glucose blood test strip Use to check blood sugar four (4) times per day, before meals and at bedtime Patient not taking: Reported on 03/12/2018 01/06/14   Cater, Luis Abed, MD  hydrOXYzine (ATARAX/VISTARIL) 25 MG tablet Take 1 tablet (25 mg total) by mouth 3 (three) times daily as needed for anxiety. Patient not taking: Reported on 03/12/2018 11/04/17   Charm Rings, NP  insulin aspart (NOVOLOG) 100 UNIT/ML injection  0-15 Units, Subcutaneous, 3 times daily with meals CBG < 70: implement hypoglycemia protocol CBG 70 - 120: 0 units CBG 121 - 150: 2 units CBG 151 - 200: 3 units CBG 201 - 250: 5 units CBG 251 - 300: 8 units CBG 301 - 350: 11 units CBG 351 - 400: 15 units CBG > 400: call MD and obtain STAT lab verification Patient not taking: Reported on 11/19/2017 10/31/17   Maretta Bees, MD  insulin glargine (LANTUS) 100 UNIT/ML injection Inject 0.06 mLs (6 Units total) into the skin at bedtime. Patient not taking: Reported on 11/19/2017 10/31/17   Maretta Bees, MD  Insulin Syringe-Needle U-100 31G X 15/64" 0.3 ML MISC Use twice daily to administer insulin Patient not taking: Reported on 08/06/2017 01/06/14   Cater, Luis Abed, MD  RELION ULTRA THIN LANCETS 30G MISC Use to check blood sugars four (4) times per day, before meals and at bedtime Patient not taking: Reported on 11/19/2017 01/06/14   Lacie Scotts, MD  traZODone (DESYREL) 100 MG tablet Take 1 tablet (100 mg total) by mouth at bedtime. Patient not taking: Reported on 03/12/2018 11/04/17   Charm Rings, NP    No Known Allergies  Patient Active Problem List   Diagnosis Date Noted  . MDD (major depressive disorder) 11/01/2017  . Intentional metformin overdose (HCC) 10/27/2017  . Adjustment disorder with mixed anxiety and depressed mood 10/27/2017  .  DYSLIPIDEMIA 12/10/2007  . Diabetes type 2, uncontrolled (HCC) 08/07/2006    Past Medical History:  Diagnosis Date  . Diabetes mellitus without complication [facility](HCC)     Past Surgical History:  Procedure Laterality Date  . CESAREAN SECTION    . TUBAL LIGATION      Social History   Socioeconomic History  . Marital status: Married    Spouse name: Not on file  . Number of children: Not on file  . Years of education: Not on file  . Highest education level: Not on file  Occupational History  . Not on file  Social Needs  . Financial resource strain: Not on file  . Food insecurity:      Worry: Not on file    Inability: Not on file  . Transportation needs:    Medical: Not on file    Non-medical: Not on file  Tobacco Use  . Smoking status: Never Smoker  . Smokeless tobacco: Never Used  Substance and Sexual Activity  . Alcohol use: No  . Drug use: No  . Sexual activity: Yes  Lifestyle  . Physical activity:    Days per week: Not on file    Minutes per session: Not on file  . Stress: Not on file  Relationships  . Social connections:    Talks on phone: Not on file    Gets together: Not on file    Attends religious service: Not on file    Active member of club or organization: Not on file    Attends meetings of clubs or organizations: Not on file    Relationship status: Not on file  . Intimate partner violence:    Fear of current or ex partner: Not on file    Emotionally abused: Not on file    Physically abused: Not on file    Forced sexual activity: Not on file  Other Topics Concern  . Not on file  Social History Narrative  . Not on file    Family History  Problem Relation Age of Onset  . Cancer Father   . Diabetes Sister   . Diabetes Sister      Review of Systems  Constitutional: Negative.  Negative for chills, fever and weight loss.  HENT: Negative.  Negative for sore throat.   Eyes: Negative.  Negative for discharge and redness.  Respiratory: Negative.  Negative for cough and shortness of breath.   Cardiovascular: Negative.  Negative for chest pain and palpitations.  Gastrointestinal: Positive for abdominal pain (Chronic). Negative for blood in stool, constipation, diarrhea, nausea and vomiting.  Genitourinary: Negative.  Negative for dysuria and hematuria.  Musculoskeletal: Negative.  Negative for back pain, myalgias and neck pain.  Skin: Negative.  Negative for rash.  Neurological: Negative.  Negative for dizziness and headaches.  Endo/Heme/Allergies: Negative.   All other systems reviewed and are negative.   Vitals:   03/12/18 0838   BP: 108/68  Pulse: 92  Resp: 16  Temp: 98.5 F (36.9 C)  SpO2: 100%    Physical Exam  Constitutional: She is oriented to person, place, and time. She appears well-developed and well-nourished.  HENT:  Head: Normocephalic and atraumatic.  Nose: Nose normal.  Mouth/Throat: Oropharynx is clear and moist.  Eyes: Pupils are equal, round, and reactive to light. Conjunctivae and EOM are normal.  Neck: Normal range of motion. Neck supple. No thyromegaly present.  Cardiovascular: Normal rate, regular rhythm and normal heart sounds.  Pulmonary/Chest: Effort normal and breath sounds normal.  Abdominal: Soft. Bowel sounds are normal. She exhibits no distension. There is no tenderness.  Musculoskeletal: Normal range of motion. She exhibits no edema or tenderness.  Lymphadenopathy:    She has no cervical adenopathy.  Neurological: She is alert and oriented to person, place, and time.  Skin: Skin is warm and dry. Capillary refill takes less than 2 seconds.  Psychiatric: She has a normal mood and affect. Her behavior is normal.  Vitals reviewed.   Results for orders placed or performed in visit on 03/12/18 (from the past 24 hour(s))  POCT glucose (manual entry)     Status: Abnormal   Collection Time: 03/12/18  9:15 AM  Result Value Ref Range   POC Glucose 288 (A) 70 - 99 mg/dl  POCT glycosylated hemoglobin (Hb A1C)     Status: Abnormal   Collection Time: 03/12/18  9:19 AM  Result Value Ref Range   Hemoglobin A1C 11.1 (A) 4.0 - 5.6 %   HbA1c POC (<> result, manual entry)  4.0 - 5.6 %   HbA1c, POC (prediabetic range)  5.7 - 6.4 %   HbA1c, POC (controlled diabetic range)  0.0 - 7.0 %    Diabetes type 2, uncontrolled (HCC) Uncontrolled diabetes with hyperglycemia, elevated hemoglobin A1c at 11.1.  Patient noncompliant with diet and medications.  Advised to increase metformin to 1000 mg twice a day and increase Amaryl to 4 mg daily with breakfast.  Advised about the importance of nutrition and  physical activity.  We will follow-up in 3 months.  Abdominal pain, chronic, epigastric Chronic abdominal pain very likely related to long-term uncontrolled diabetes.  Possible diabetic gastroparesis.  Advised on nutrition and as needed use of Mylanta and or H2 blockers.  Diabetic control is the key for treatment of this chronic condition.   ASSESSMENT & PLAN: Judianne was seen today for diabetes and medication refill.  Diagnoses and all orders for this visit:  Uncontrolled type 2 diabetes mellitus with hyperglycemia (HCC) -     POCT glucose (manual entry) -     POCT glycosylated hemoglobin (Hb A1C) -     metFORMIN (GLUCOPHAGE) 1000 MG tablet; Take 1 tablet (1,000 mg total) by mouth 2 (two) times daily with a meal. -     glimepiride (AMARYL) 4 MG tablet; Take 1 tablet (4 mg total) by mouth daily with breakfast.  Abdominal pain, chronic, epigastric -     Lipase -     Comprehensive metabolic panel    Patient Instructions       IF you received an x-ray today, you will receive an invoice from [facility]. Please contact [facility] Radiology at [phone] with questions or concerns regarding your invoice.   IF you received labwork today, you will receive an invoice from [facility]. Please contact [facility] at [phone] with questions or concerns regarding your invoice.   Our billing staff will not be able to assist you with questions regarding bills from these companies.  You will be contacted with the lab results as soon as they are available. The fastest way to get your results is to activate your My Chart account. Instructions are located on the last page of this paperwork. If you have not heard from [place] regarding the results in 2 weeks, please contact this office.     Diabetes mellitus y nutricin Diabetes Mellitus and Nutrition Si sufre de diabetes (diabetes mellitus), es muy importante tener hbitos alimenticios saludables debido a que sus niveles de Psychologist, counselling  sangre (glucosa) se ven  afectados en gran medida por lo que come y bebe. [place] alimentos saludables en las cantidades [place], aproximadamente a la [employer], [place] ayudar a:  Scientist, physiological glucemia.  Disminuir el riesgo de sufrir una enfermedad cardaca.  Mejorar la presin arterial.  Barista o mantener un peso saludable.  Todas las personas que sufren de diabetes son diferentes y cada una tiene necesidades diferentes en cuanto a un plan de alimentacin. El mdico puede recomendarle que trabaje con un especialista en dietas y nutricin (nutricionista) para Tax adviser plan para usted. Su plan de alimentacin puede variar segn factores como:  Las caloras que necesita.  Los medicamentos que toma.  Su peso.  Sus niveles de glucemia, presin arterial y colesterol.  Su nivel de [place].  Otras afecciones que tenga, como enfermedades cardacas o renales.  Cmo me afectan los carbohidratos? Los carbohidratos afectan el nivel de glucemia ms que cualquier otro tipo de alimento. La ingesta de carbohidratos naturalmente aumenta la cantidad glucosa en la sangre. El recuento de carbohidratos es un mtodo destinado a Midwife un registro de la cantidad de carbohidratos que se ingieren. El recuento de carbohidratos es importante para Pharmacologist la glucemia a un nivel saludable, en especial si utiliza insulina o toma determinados medicamentos por va oral para la diabetes. Es importante saber la cantidad de carbohidratos que se pueden ingerir en cada comida sin correr Surveyor, minerals. Esto es Government social research officer. El nutricionista puede ayudarlo a calcular la cantidad de carbohidratos que debe ingerir en cada comida y colacin. Los alimentos que contienen carbohidratos incluyen:  Pan, cereal, arroz, pasta y galletas.  Papas y maz.  Guisantes, frijoles y lentejas.  Leche y Dentist.  Nils Pyle y [place].  Postres, como pasteles, galletitas, helado y caramelos.  Cmo me  afecta el alcohol? El alcohol puede provocar disminuciones sbitas de la glucemia (hipoglucemia), en especial si utiliza insulina o toma determinados medicamentos por va oral para la diabetes. La hipoglucemia es una afeccin potencialmente mortal. Los sntomas de la hipoglucemia (somnolencia, mareos y confusin) son similares a los sntomas de haber consumido demasiado alcohol. Si el mdico afirma que el alcohol es seguro para usted, siga estas pautas:  Limite el consumo de alcohol a no ms de 1 medida por da si es mujer y no est Orthoptist, y a 2 medidas si es hombre. Una medida equivale a 12oz ( ) de cerveza, 5oz ( ) de vino o 1oz (44ml) de bebidas de alta graduacin alcohlica.  No beba con el estmago vaco.  Mantngase hidratado con agua, gaseosas dietticas o t helado sin azcar.  Tenga en cuenta que las gaseosas comunes, los jugos y otros refrescos pueden contener mucha azcar y se deben contar como carbohidratos.  Consejos para seguir Consulting civil engineer las etiquetas de los alimentos  Comience por controlar el tamao de la porcin en la etiqueta. La cantidad de caloras, carbohidratos, grasas y otros nutrientes mencionados en la etiqueta se basan en una porcin del alimento. Muchos alimentos contienen ms de una porcin por envase.  Verifique la cantidad total de gramos (g) de carbohidratos totales en una porcin. Puede calcular la cantidad de porciones de carbohidratos al dividir el total de carbohidratos por 15. Por ejemplo, si un alimento posee un total de 30g de carbohidratos, equivale a 2 porciones de carbohidratos.  Verifique la cantidad de gramos (g) de grasas saturadas y grasas trans en una porcin. Escoja alimentos que no contengan grasa o que tengan un bajo contenido.  Controle la cantidad  de miligramos (mg) de sodio en una porcin. La mayora de las personas deben limitar la ingesta de sodio total a menos de 2300mg  por Futures trader.  Siempre consulte la informacin  nutricional de los alimentos etiquetados como "con bajo contenido de grasa" o "sin grasa". Estos alimentos pueden ser ms altos en azcar agregada o en carbohidratos refinados y deben evitarse.  Hable con el nutricionista para identificar sus objetivos diarios en cuanto a los nutrientes mencionados en la etiqueta. De compras  Evite comprar alimentos procesados, enlatados o prehechos. Estos alimentos tienden a Civil Service fast streamer cantidad de [place], sodio y azcar agregada.  Compre en la zona exterior de la tienda de comestibles. Esta incluye frutas y [employer], granos a granel, carnes frescas y productos lcteos frescos. Coccin  Utilice mtodos de coccin a baja temperatura, como hornear, en lugar de mtodos de coccin a alta temperatura, como frer en abundante aceite.  Cocine con aceites saludables, como el aceite de [place], canola o [place].  Evite cocinar con manteca, crema o carnes con alto contenido de grasa. Planificacin de las comidas  [employer] comidas y las colaciones de forma regular, preferentemente a la misma hora todos [place]. Evite pasar largos perodos de tiempo sin comer.  Consuma alimentos ricos en fibra, como frutas frescas, verduras, frijoles y cereales integrales. Consulte al nutricionista sobre cuntas porciones de carbohidratos puede consumir en cada comida.  Consuma entre 4 y 6 onzas de protenas magras por da, como carnes [place], pollo, pescado, [employer] o tofu. 1 onza equivale a 1 onza de carne, pollo o pescado, 1 huevo, o 1/4 taza de tofu.  Coma algunos alimentos por da que contengan grasas saludables, como aguacates, frutos secos, semillas y pescado. Estilo de vida   Controle su nivel de glucemia con regularidad.  Haga ejercicio al menos , 5das o ms por semana, o como se lo haya indicado el mdico.  Tome los [employer] se lo haya indicado el mdico.  No consuma ningn producto que contenga nicotina o tabaco, como cigarrillos y  Administrator, Civil Service. Si necesita ayuda para dejar de fumar, consulte al CIGNA con un asesor o instructor en diabetes para identificar estrategias para controlar el estrs y cualquier desafo emocional y social. Cules son algunas de las preguntas que puedo hacerle a mi mdico?  Es necesario que me rena con IT trainer en diabetes?  Es necesario que me rena con un nutricionista?  A qu nmero puedo llamar si tengo preguntas?  Cules son los mejores momentos para controlar la glucemia? Dnde encontrar ms informacin:  Asociacin Americana de la Diabetes (American Diabetes Association): diabetes.org/food-and-fitness/food  Academia de Nutricin y Pension scheme manager (Academy of Nutrition and Dietetics): https://www.vargas.com/  The [employer] de la Diabetes y las Enfermedades Digestivas y Special educational needs teacher [facility] of Diabetes and Digestive and Kidney Diseases) (Institutos Nacionales de [place], NIH): FindJewelers.cz Resumen  Un plan de alimentacin saludable lo ayudar a Scientist, physiological glucemia y Pharmacologist un estilo de vida saludable.  Trabajar con un especialista en dietas y nutricin (nutricionista) puede ayudarlo a Designer, television/film set de alimentacin para usted.  Tenga en cuenta que los carbohidratos y el alcohol tienen efectos inmediatos en sus niveles de glucemia. Es importante contar los carbohidratos y consumir alcohol con prudencia. Esta informacin no tiene Theme park manager el consejo del mdico. Asegrese de hacerle al mdico cualquier pregunta que tenga. Document Released: 12/12/2007 Document Revised: 12/25/2016 Document Reviewed: 12/25/2016 Elsevier Interactive Patient Education  2018 [employer].      [place],  MD [facility]

## 2018-03-13 ENCOUNTER — Encounter: Payer: Self-pay | Admitting: *Deleted

## 2018-03-13 LAB — COMPREHENSIVE METABOLIC PANEL
ALK PHOS: 66 IU/L (ref 39–117)
ALT: 31 IU/L (ref 0–32)
AST: 79 IU/L — ABNORMAL HIGH (ref 0–40)
Albumin/Globulin Ratio: 1.4 (ref 1.2–2.2)
Albumin: 4.3 g/dL (ref 3.5–5.5)
BILIRUBIN TOTAL: 0.9 mg/dL (ref 0.0–1.2)
BUN/Creatinine Ratio: 17 (ref 9–23)
BUN: 14 mg/dL (ref 6–20)
CHLORIDE: 98 mmol/L (ref 96–106)
CO2: 21 mmol/L (ref 20–29)
Calcium: 9.6 mg/dL (ref 8.7–10.2)
Creatinine, Ser: 0.82 mg/dL (ref 0.57–1.00)
GFR calc non Af Amer: 91 mL/min/{1.73_m2} (ref >59)
GFR, EST AFRICAN AMERICAN: 105 mL/min/{1.73_m2} (ref >59)
Globulin, Total: 3.1 g/dL (ref 1.5–4.5)
Glucose: 286 mg/dL — ABNORMAL HIGH (ref 65–99)
Potassium: 4.7 mmol/L (ref 3.5–5.2)
Sodium: 134 mmol/L (ref 134–144)
TOTAL PROTEIN: 7.4 g/dL (ref 6.0–8.5)

## 2018-03-13 LAB — LIPASE: LIPASE: 24 U/L (ref 14–72)

## 2018-03-13 NOTE — Progress Notes (Signed)
Letter sent.

## 2020-09-01 ENCOUNTER — Other Ambulatory Visit: Payer: Self-pay

## 2020-09-01 ENCOUNTER — Encounter (HOSPITAL_COMMUNITY): Payer: Self-pay

## 2020-09-01 ENCOUNTER — Emergency Department (HOSPITAL_COMMUNITY): Payer: HRSA Program

## 2020-09-01 ENCOUNTER — Observation Stay (HOSPITAL_COMMUNITY)
Admission: EM | Admit: 2020-09-01 | Discharge: 2020-09-02 | Disposition: A | Discharge status: Home / Self Care | Payer: HRSA Program | Attending: Internal Medicine | Admitting: Internal Medicine

## 2020-09-01 DIAGNOSIS — Z7984 Long term (current) use of oral hypoglycemic drugs: Secondary | ICD-10-CM | POA: Diagnosis not present

## 2020-09-01 DIAGNOSIS — Z794 Long term (current) use of insulin: Secondary | ICD-10-CM | POA: Insufficient documentation

## 2020-09-01 DIAGNOSIS — J1282 Pneumonia due to coronavirus disease 2019: Secondary | ICD-10-CM

## 2020-09-01 DIAGNOSIS — E1310 Other specified diabetes mellitus with ketoacidosis without coma: Secondary | ICD-10-CM

## 2020-09-01 DIAGNOSIS — R059 Cough, unspecified: Secondary | ICD-10-CM | POA: Diagnosis present

## 2020-09-01 DIAGNOSIS — U071 COVID-19: Secondary | ICD-10-CM | POA: Diagnosis not present

## 2020-09-01 DIAGNOSIS — E1165 Type 2 diabetes mellitus with hyperglycemia: Secondary | ICD-10-CM

## 2020-09-01 DIAGNOSIS — E1110 Type 2 diabetes mellitus with ketoacidosis without coma: Secondary | ICD-10-CM | POA: Insufficient documentation

## 2020-09-01 HISTORY — DX: COVID-19: U07.1

## 2020-09-01 LAB — LACTIC ACID, PLASMA: Lactic Acid, Venous: 1.4 mmol/L (ref 0.5–1.9)

## 2020-09-01 LAB — CBC WITH DIFFERENTIAL/PLATELET
Abs Immature Granulocytes: 0.03 10*3/uL (ref 0.00–0.07)
Basophils Absolute: 0 10*3/uL (ref 0.0–0.1)
Basophils Relative: 1 %
Eosinophils Absolute: 0 10*3/uL (ref 0.0–0.5)
Eosinophils Relative: 0 %
HCT: 44.2 % (ref 36.0–46.0)
Hemoglobin: 14.5 g/dL (ref 12.0–15.0)
Immature Granulocytes: 1 %
Lymphocytes Relative: 21 %
Lymphs Abs: 1.1 10*3/uL (ref 0.7–4.0)
MCH: 30.5 pg (ref 26.0–34.0)
MCHC: 32.8 g/dL (ref 30.0–36.0)
MCV: 93.1 fL (ref 80.0–100.0)
Monocytes Absolute: 0.6 10*3/uL (ref 0.1–1.0)
Monocytes Relative: 12 %
Neutro Abs: 3.4 10*3/uL (ref 1.7–7.7)
Neutrophils Relative %: 65 %
Platelets: 254 10*3/uL (ref 150–400)
RBC: 4.75 MIL/uL (ref 3.87–5.11)
RDW: 11.9 % (ref 11.5–15.5)
WBC: 5.1 10*3/uL (ref 4.0–10.5)
nRBC: 0 % (ref 0.0–0.2)

## 2020-09-01 LAB — COMPREHENSIVE METABOLIC PANEL
ALT: 39 U/L (ref 0–44)
AST: 49 U/L — ABNORMAL HIGH (ref 15–41)
Albumin: 3.8 g/dL (ref 3.5–5.0)
Alkaline Phosphatase: 71 U/L (ref 38–126)
Anion gap: 19 — ABNORMAL HIGH (ref 5–15)
BUN: 14 mg/dL (ref 6–20)
CO2: 14 mmol/L — ABNORMAL LOW (ref 22–32)
Calcium: 8.5 mg/dL — ABNORMAL LOW (ref 8.9–10.3)
Chloride: 99 mmol/L (ref 98–111)
Creatinine, Ser: 0.77 mg/dL (ref 0.44–1.00)
GFR, Estimated: >60 mL/min (ref >60)
Glucose, Bld: 281 mg/dL — ABNORMAL HIGH (ref 70–99)
Potassium: 4.1 mmol/L (ref 3.5–5.1)
Sodium: 132 mmol/L — ABNORMAL LOW (ref 135–145)
Total Bilirubin: 0.8 mg/dL (ref 0.3–1.2)
Total Protein: 8.2 g/dL — ABNORMAL HIGH (ref 6.5–8.1)

## 2020-09-01 LAB — I-STAT BETA HCG BLOOD, ED (MC, WL, AP ONLY): I-stat hCG, quantitative: <5 m[IU]/mL (ref <5)

## 2020-09-01 LAB — RESP PANEL BY RT-PCR (FLU A&B, COVID) ARPGX2
Influenza A by PCR: NEGATIVE
Influenza B by PCR: NEGATIVE
SARS Coronavirus 2 by RT PCR: POSITIVE — AB

## 2020-09-01 LAB — OSMOLALITY: Osmolality: 297 mOsm/kg — ABNORMAL HIGH (ref 275–295)

## 2020-09-01 LAB — HEMOGLOBIN A1C
Hgb A1c MFr Bld: 12.7 % — ABNORMAL HIGH (ref 4.8–5.6)
Mean Plasma Glucose: 317.79 mg/dL

## 2020-09-01 LAB — CBG MONITORING, ED
Glucose-Capillary: 166 mg/dL — ABNORMAL HIGH (ref 70–99)
Glucose-Capillary: 202 mg/dL — ABNORMAL HIGH (ref 70–99)

## 2020-09-01 MED ORDER — SODIUM CHLORIDE 0.9 % IV SOLN: INTRAVENOUS | Status: DC | PRN

## 2020-09-01 MED ORDER — METHYLPREDNISOLONE SODIUM SUCC 125 MG IJ SOLR: 125.0000 mg | Freq: Once | INTRAMUSCULAR | Status: DC | PRN

## 2020-09-01 MED ORDER — ONDANSETRON HCL 4 MG/2ML IJ SOLN: 4.0000 mg | Freq: Four times a day (QID) | INTRAMUSCULAR | Status: DC | PRN

## 2020-09-01 MED ORDER — ONDANSETRON HCL 4 MG PO TABS: 4.0000 mg | ORAL_TABLET | Freq: Four times a day (QID) | ORAL | Status: DC | PRN

## 2020-09-01 MED ORDER — LACTATED RINGERS IV BOLUS: 20.0000 mL/kg | Freq: Once | INTRAVENOUS | Status: DC

## 2020-09-01 MED ORDER — ASCORBIC ACID 500 MG PO TABS
500.0000 mg | ORAL_TABLET | Freq: Every day | ORAL | Status: DC
  Administered 2020-09-02: 10:00:00 500 mg via ORAL
  Filled 2020-09-01: qty 1

## 2020-09-01 MED ORDER — ALBUTEROL SULFATE HFA 108 (90 BASE) MCG/ACT IN AERS: 2.0000 | INHALATION_SPRAY | RESPIRATORY_TRACT | Status: DC | PRN

## 2020-09-01 MED ORDER — SODIUM CHLORIDE 0.9 % IV BOLUS
1000.0000 mL | Freq: Once | INTRAVENOUS | Status: AC
  Administered 2020-09-01: 19:00:00 1000 mL via INTRAVENOUS

## 2020-09-01 MED ORDER — ACETAMINOPHEN 325 MG PO TABS: 650.0000 mg | ORAL_TABLET | Freq: Four times a day (QID) | ORAL | Status: DC | PRN

## 2020-09-01 MED ORDER — DIPHENHYDRAMINE HCL 50 MG/ML IJ SOLN: 50.0000 mg | Freq: Once | INTRAMUSCULAR | Status: DC | PRN

## 2020-09-01 MED ORDER — ZINC SULFATE 220 (50 ZN) MG PO CAPS
220.0000 mg | ORAL_CAPSULE | Freq: Every day | ORAL | Status: DC
  Administered 2020-09-02: 10:00:00 220 mg via ORAL
  Filled 2020-09-01: qty 1

## 2020-09-01 MED ORDER — DEXTROSE IN LACTATED RINGERS 5 % IV SOLN
INTRAVENOUS | Status: DC
  Administered 2020-09-01: 21:00:00 125 mL via INTRAVENOUS
  Filled 2020-09-01: qty 1000

## 2020-09-01 MED ORDER — FAMOTIDINE IN NACL 20-0.9 MG/50ML-% IV SOLN: 20.0000 mg | Freq: Once | INTRAVENOUS | Status: DC | PRN

## 2020-09-01 MED ORDER — SODIUM CHLORIDE 0.9 % IV SOLN
Freq: Once | INTRAVENOUS | Status: AC
  Filled 2020-09-01: qty 700

## 2020-09-01 MED ORDER — DEXTROSE 50 % IV SOLN
0.0000 mL | INTRAVENOUS | Status: DC | PRN
Start: 2020-09-01 — End: 2020-09-02

## 2020-09-01 MED ORDER — POTASSIUM CHLORIDE 10 MEQ/100ML IV SOLN
10.0000 meq | INTRAVENOUS | Status: AC
  Administered 2020-09-01 (×2): 10 meq via INTRAVENOUS
  Filled 2020-09-01 (×2): qty 100

## 2020-09-01 MED ORDER — GUAIFENESIN-DM 100-10 MG/5ML PO SYRP: 10.0000 mL | ORAL_SOLUTION | ORAL | Status: DC | PRN

## 2020-09-01 MED ORDER — POLYETHYLENE GLYCOL 3350 17 G PO PACK: 17.0000 g | PACK | Freq: Every day | ORAL | Status: DC | PRN

## 2020-09-01 MED ORDER — LACTATED RINGERS IV SOLN: INTRAVENOUS | Status: DC

## 2020-09-01 MED ORDER — ALBUTEROL SULFATE HFA 108 (90 BASE) MCG/ACT IN AERS: 2.0000 | INHALATION_SPRAY | Freq: Once | RESPIRATORY_TRACT | Status: DC | PRN

## 2020-09-01 MED ORDER — EPINEPHRINE 0.3 MG/0.3ML IJ SOAJ: 0.3000 mg | Freq: Once | INTRAMUSCULAR | Status: DC | PRN

## 2020-09-01 MED ORDER — ENOXAPARIN SODIUM 40 MG/0.4ML ~~LOC~~ SOLN
40.0000 mg | SUBCUTANEOUS | Status: DC
  Administered 2020-09-01: 40 mg via SUBCUTANEOUS
  Filled 2020-09-01: qty 0.4

## 2020-09-01 MED ORDER — INSULIN REGULAR(HUMAN) IN NACL 100-0.9 UT/100ML-% IV SOLN
INTRAVENOUS | Status: DC
  Administered 2020-09-01: 22:00:00 5 [IU]/h via INTRAVENOUS
  Filled 2020-09-01: qty 100

## 2020-09-01 NOTE — ED Triage Notes (Signed)
Pt presents with c/o covid positive by a home test. Pt c/o generalized body aches, no smell or test, and headaches.

## 2020-09-01 NOTE — ED Provider Notes (Signed)
[facility]-EMERGENCY DEPT Provider Note   CSN: [id number] Arrival date & time: 09/01/20  1356     History Chief Complaint  Patient presents with  . Covid Positive    Shelby Brown is a 39 y.o. female.  Patient is a 39 year old female with past medical history of type 2 diabetes, depression.  She presents today for evaluation of cough, body aches, fever.  This has been worsening over the past 3 days.  Patient took a Covid test at home and it was positive.  She had reports her daughter is ill with Covid as well.  Patient does report some tightness in her chest with breathing and feels short of breath.  There are no aggravating or alleviating factors.  The history is provided by the patient.       Past Medical History:  Diagnosis Date  . Diabetes mellitus without complication [facility])     Patient Active Problem List   Diagnosis Date Noted  . Abdominal pain, chronic, epigastric 03/12/2018  . MDD (major depressive disorder) 11/01/2017  . Intentional metformin overdose (HCC) 10/27/2017  . Adjustment disorder with mixed anxiety and depressed mood 10/27/2017  . DYSLIPIDEMIA 12/10/2007  . Diabetes type 2, uncontrolled (HCC) 08/07/2006    Past Surgical History:  Procedure Laterality Date  . CESAREAN SECTION    . TUBAL LIGATION       OB History   No obstetric history on file.     Family History  Problem Relation Age of Onset  . Cancer Father   . Diabetes Sister   . Diabetes Sister     Social History   Tobacco Use  . Smoking status: Never Smoker  . Smokeless tobacco: Never Used  Substance Use Topics  . Alcohol use: No  . Drug use: No    Home Medications Prior to Admission medications   Medication Sig Start Date End Date Taking? Authorizing Provider  Blood Glucose Monitoring Suppl (RELION PRIME MONITOR) DEVI Please use device to check blood sugars four (4) times per day, before meals and at bedtime. Patient not taking: Reported on  11/19/2017 01/06/14   Cater, Luis Abed, MD  citalopram (CELEXA) 20 MG tablet Take 1 tablet (20 mg total) by mouth daily. Patient not taking: Reported on 03/12/2018 11/05/17   Charm Rings, NP  glimepiride (AMARYL) 4 MG tablet Take 1 tablet (4 mg total) by mouth daily with breakfast. 03/12/18 06/10/18  Georgina Quint, MD  glucose blood test strip Use to check blood sugar four (4) times per day, before meals and at bedtime Patient not taking: Reported on 03/12/2018 01/06/14   Cater, Luis Abed, MD  hydrOXYzine (ATARAX/VISTARIL) 25 MG tablet Take 1 tablet (25 mg total) by mouth 3 (three) times daily as needed for anxiety. Patient not taking: Reported on 03/12/2018 11/04/17   Charm Rings, NP  insulin aspart (NOVOLOG) 100 UNIT/ML injection 0-15 Units, Subcutaneous, 3 times daily with meals CBG < 70: implement hypoglycemia protocol CBG 70 - 120: 0 units CBG 121 - 150: 2 units CBG 151 - 200: 3 units CBG 201 - 250: 5 units CBG 251 - 300: 8 units CBG 301 - 350: 11 units CBG 351 - 400: 15 units CBG > 400: call MD and obtain STAT lab verification Patient not taking: Reported on 11/19/2017 10/31/17   Maretta Bees, MD  insulin glargine (LANTUS) 100 UNIT/ML injection Inject 0.06 mLs (6 Units total) into the skin at bedtime. Patient not taking: Reported on 11/19/2017 10/31/17  Maretta Bees, MD  Insulin Syringe-Needle U-100 31G X 15/64" 0.3 ML MISC Use twice daily to administer insulin Patient not taking: Reported on 08/06/2017 01/06/14   Cater, Luis Abed, MD  metFORMIN (GLUCOPHAGE) 1000 MG tablet Take 1 tablet (1,000 mg total) by mouth 2 (two) times daily with a meal. 03/12/18   Sagardia, Eilleen Kempf, MD  RELION ULTRA THIN LANCETS 30G MISC Use to check blood sugars four (4) times per day, before meals and at bedtime Patient not taking: Reported on 11/19/2017 01/06/14   Lacie Scotts, MD  traZODone (DESYREL) 100 MG tablet Take 1 tablet (100 mg total) by mouth at bedtime. Patient not taking: Reported on  03/12/2018 11/04/17   Charm Rings, NP    Allergies    Patient has no known allergies.  Review of Systems   Review of Systems  All other systems reviewed and are negative.   Physical Exam Updated Vital Signs BP 124/78   Pulse 97   Temp 100 F (37.8 C) (Oral)   Resp (!) 21   LMP 08/07/2020 (Approximate)   SpO2 96%   Physical Exam Vitals and nursing note reviewed.  Constitutional:      General: She is not in acute distress.    Appearance: She is well-developed and well-nourished. She is not diaphoretic.  HENT:     Head: Normocephalic and atraumatic.  Cardiovascular:     Rate and Rhythm: Normal rate and regular rhythm.     Heart sounds: No murmur heard. No friction rub. No gallop.   Pulmonary:     Effort: Pulmonary effort is normal. No respiratory distress.     Breath sounds: Normal breath sounds. No wheezing.  Abdominal:     General: Bowel sounds are normal. There is no distension.     Palpations: Abdomen is soft.     Tenderness: There is no abdominal tenderness.  Musculoskeletal:        General: Normal range of motion.     Cervical back: Normal range of motion and neck supple.     Right lower leg: No edema.     Left lower leg: No edema.  Skin:    General: Skin is warm and dry.  Neurological:     Mental Status: She is alert and oriented to person, place, and time.     ED Results / Procedures / Treatments   Labs (all labs ordered are listed, but only abnormal results are displayed) Labs Reviewed  CULTURE, BLOOD (ROUTINE X 2)  CULTURE, BLOOD (ROUTINE X 2)  RESP PANEL BY RT-PCR (FLU A&B, COVID) ARPGX2  COMPREHENSIVE METABOLIC PANEL  CBC WITH DIFFERENTIAL/PLATELET  LACTIC ACID, PLASMA  LACTIC ACID, PLASMA    EKG None  Radiology No results found.  Procedures Procedures (including critical care time)  Medications Ordered in ED Medications - No data to display  ED Course  I have reviewed the triage vital signs and the nursing notes.  Pertinent  labs & imaging results that were available during my care of the patient were reviewed by me and considered in my medical decision making (see chart for details).    MDM Rules/Calculators/A&P  Patient is a 39 year old Hispanic female with history of type 2 diabetes.  She recently had a positive home Covid test.  She presents today with feeling short of breath along with body aches and generalized malaise.  Her Covid test here is positive.  Chest x-ray does show possible COVID pneumonia.  Her electrolyte panel is concerning for DKA.  She has an elevated anion gap, decreased CO2, and blood sugar of 281.  IV fluids have been administered and patient will be admitted to the hospitalist service under the care of Dr. Martyn Malay.  CRITICAL CARE Performed by: Geoffery Lyons Total critical care time: 45 minutes Critical care time was exclusive of separately billable procedures and treating other patients. Critical care was necessary to treat or prevent imminent or life-threatening deterioration. Critical care was time spent personally by me on the following activities: development of treatment plan with patient and/or surrogate as well as nursing, discussions with consultants, evaluation of patient's response to treatment, examination of patient, obtaining history from patient or surrogate, ordering and performing treatments and interventions, ordering and review of laboratory studies, ordering and review of radiographic studies, pulse oximetry and re-evaluation of patient's condition.   Final Clinical Impression(s) / ED Diagnoses Final diagnoses:  None    Rx / DC Orders ED Discharge Orders    None       Geoffery Lyons, MD 09/01/20 2336

## 2020-09-01 NOTE — H&P (Signed)
History and Physical    Shelby Brown PPI:[id number] DOB: 10-Jul-1981 DOA: 09/01/2020  PCP: Patient, No Pcp Per  Patient coming from: Home   Chief Complaint:  Chief Complaint  Patient presents with   Covid Positive     HPI:   39 year old Spanish-speaking female with past medical history of diabetes mellitus type 2, medical noncompliance presenting with complaints of malaise and generalized weakness.  Patient explains that for the past several days she has been experiencing increasingly worsening generalized weakness and fatigue.  Patient explains that in the past several days her daughter and niece have both fallen ill.  Patient explains that in the past several days she has experienced a dry nonproductive cough but denies associated shortness of breath.  Patient complains of associated poor appetite and generalized weakness.  Generalized weakness has become particularly severe patient does complain of some intermittently watery diarrhea but that resolved several days ago.  Patient denies any active fevers, nausea or vomiting.  Patient is complaining of some intermittent generalized abdominal cramping.  Generalized weakness in particular has become quite severe in the past several days, worse with exertion and improved with rest.  Due to worsening symptoms patient eventually presented to [facility] emergency department for evaluation.  Upon evaluation at [facility] emergency department, patient was initially found to be hypotensive with a systolic blood pressure as low as 72/54.  Patient has been found to be hyperglycemic with metabolic acidosis concerning for DKA.  The hospitalist group is now been called to assess the patient for admission to the hospital.  Review of Systems:   Review of Systems  Constitutional: Positive for malaise/fatigue.  Respiratory: Positive for cough.   Gastrointestinal: Positive for diarrhea.  Neurological: Positive for weakness.   All other systems reviewed and are negative.   Past Medical History:  Diagnosis Date   Diabetes mellitus without complication (HCC)     Past Surgical History:  Procedure Laterality Date   CESAREAN SECTION     TUBAL LIGATION       reports that she has never smoked. She has never used smokeless tobacco. She reports that she does not drink alcohol and does not use drugs.  No Known Allergies  Family History  Problem Relation Age of Onset   Cancer Father    Diabetes Sister    Diabetes Sister      Prior to Admission medications   Medication Sig Start Date End Date Taking? Authorizing Provider  Blood Glucose Monitoring Suppl (RELION PRIME MONITOR) DEVI Please use device to check blood sugars four (4) times per day, before meals and at bedtime. Patient not taking: No sig reported 01/06/14   Cater, Luis Abed, MD  citalopram (CELEXA) 20 MG tablet Take 1 tablet (20 mg total) by mouth daily. Patient not taking: No sig reported 11/05/17   Charm Rings, NP  glimepiride (AMARYL) 4 MG tablet Take 1 tablet (4 mg total) by mouth daily with breakfast. Patient not taking: Reported on 09/01/2020 03/12/18 06/10/18  Georgina Quint, MD  glucose blood test strip Use to check blood sugar four (4) times per day, before meals and at bedtime Patient not taking: No sig reported 01/06/14   Cater, Luis Abed, MD  hydrOXYzine (ATARAX/VISTARIL) 25 MG tablet Take 1 tablet (25 mg total) by mouth 3 (three) times daily as needed for anxiety. Patient not taking: No sig reported 11/04/17   Charm Rings, NP  insulin aspart (NOVOLOG) 100 UNIT/ML injection 0-15 Units, Subcutaneous, 3 times daily  with meals CBG < 70: implement hypoglycemia protocol CBG 70 - 120: 0 units CBG 121 - 150: 2 units CBG 151 - 200: 3 units CBG 201 - 250: 5 units CBG 251 - 300: 8 units CBG 301 - 350: 11 units CBG 351 - 400: 15 units CBG > 400: call MD and obtain STAT lab verification Patient not taking: No sig reported 10/31/17    Maretta Bees, MD  insulin glargine (LANTUS) 100 UNIT/ML injection Inject 0.06 mLs (6 Units total) into the skin at bedtime. Patient not taking: No sig reported 10/31/17   Maretta Bees, MD  Insulin Syringe-Needle U-100 31G X 15/64" 0.3 ML MISC Use twice daily to administer insulin Patient not taking: No sig reported 01/06/14   Cater, Luis Abed, MD  metFORMIN (GLUCOPHAGE) 1000 MG tablet Take 1 tablet (1,000 mg total) by mouth 2 (two) times daily with a meal. Patient not taking: Reported on 09/01/2020 03/12/18   Georgina Quint, MD  RELION ULTRA THIN LANCETS 30G MISC Use to check blood sugars four (4) times per day, before meals and at bedtime Patient not taking: No sig reported 01/06/14   Cater, Luis Abed, MD  traZODone (DESYREL) 100 MG tablet Take 1 tablet (100 mg total) by mouth at bedtime. Patient not taking: No sig reported 11/04/17   Charm Rings, NP    Physical Exam: Vitals:   09/01/20 1930 09/01/20 2000 09/01/20 2030 09/01/20 2034  BP: 124/77 135/81 118/72   Pulse: 94 95 95   Resp: (!) 25 18 15    Temp:      TempSrc:      SpO2: 100% 100% 99%   Weight:    52.6 kg  Height:    5\' 3"  (1.6 m)    Constitutional: Acute alert and oriented x3, no associated distress.   Skin: no rashes, no lesions, notable poor skin turgor. Eyes: Pupils are equally reactive to light.  No evidence of scleral icterus or conjunctival pallor.  ENMT: Dry mucous membranes noted.  Posterior pharynx clear of any exudate or lesions.   Neck: normal, supple, no masses, no thyromegaly.  No evidence of jugular venous distension.   Respiratory: clear to auscultation bilaterally, no wheezing, no crackles. Normal respiratory effort. No accessory muscle use.  Cardiovascular: Regular rate and rhythm, no murmurs / rubs / gallops. No extremity edema. 2+ pedal pulses. No carotid bruits.  Chest:   Nontender without crepitus or deformity.   Back:   Nontender without crepitus or deformity. Abdomen: Abdomen is soft  and nontender.  No evidence of intra-abdominal masses.  Positive bowel sounds noted in all quadrants.   Musculoskeletal: No joint deformity upper and lower extremities. Good ROM, no contractures. Normal muscle tone.  Neurologic: CN 2-12 grossly intact. Sensation intact.  Patient moving all 4 extremities spontaneously.  Patient is following all commands.  Patient is responsive to verbal stimuli.  Psychiatric: Patient exhibits normal mood with appropriate affect.  Patient seems to possess insight as to their current situation.     Labs on Admission: I have personally reviewed following labs and imaging studies -   CBC: Recent Labs  Lab 09/01/20 1733  WBC 5.1  NEUTROABS 3.4  HGB 14.5  HCT 44.2  MCV 93.1  PLT 254   Basic Metabolic Panel: Recent Labs  Lab 09/01/20 1733  NA 132*  K 4.1  CL 99  CO2 14*  GLUCOSE 281*  BUN 14  CREATININE 0.77  CALCIUM 8.5*   GFR: Estimated Creatinine Clearance:  77.3 mL/min (by C-G formula based on SCr of 0.77 mg/dL). Liver Function Tests: Recent Labs  Lab 09/01/20 1733  AST 49*  ALT 39  ALKPHOS 71  BILITOT 0.8  PROT 8.2*  ALBUMIN 3.8   No results for input(s): LIPASE, AMYLASE in the last 168 hours. No results for input(s): AMMONIA in the last 168 hours. Coagulation Profile: No results for input(s): INR, PROTIME in the last 168 hours. Cardiac Enzymes: No results for input(s): CKTOTAL, CKMB, CKMBINDEX, TROPONINI in the last 168 hours. BNP (last 3 results) No results for input(s): PROBNP in the last 8760 hours. HbA1C: No results for input(s): HGBA1C in the last 72 hours. CBG: Recent Labs  Lab 09/01/20 2150  GLUCAP 202*   Lipid Profile: No results for input(s): CHOL, HDL, LDLCALC, TRIG, CHOLHDL, LDLDIRECT in the last 72 hours. Thyroid Function Tests: No results for input(s): TSH, T4TOTAL, FREET4, T3FREE, THYROIDAB in the last 72 hours. Anemia Panel: No results for input(s): VITAMINB12, FOLATE, FERRITIN, TIBC, IRON, RETICCTPCT in  the last 72 hours. Urine analysis:    Component Value Date/Time   COLORURINE AMBER (A) 11/28/2014 1725   APPEARANCEUR CLEAR 11/28/2014 1725   LABSPEC 1.035 (H) 11/28/2014 1725   PHURINE 5.5 11/28/2014 1725   GLUCOSEU >1000 (A) 11/28/2014 1725   GLUCOSEU > 1000 mg/dL (A) 02/40/9735 3299   HGBUR NEGATIVE 11/28/2014 1725   BILIRUBINUR small (A) 08/06/2017 1044   KETONESUR >= (160) (A) 08/06/2017 1044   KETONESUR >80 (A) 11/28/2014 1725   PROTEINUR negative 08/06/2017 1044   PROTEINUR 30 (A) 11/28/2014 1725   UROBILINOGEN 0.2 08/06/2017 1044   UROBILINOGEN 0.2 11/28/2014 1725   NITRITE Negative 08/06/2017 1044   NITRITE NEGATIVE 11/28/2014 1725   LEUKOCYTESUR Negative 08/06/2017 1044    Radiological Exams on Admission - Personally Reviewed: DG Chest Port 1 View  Result Date: 09/01/2020 CLINICAL DATA:  COVID. Body aches. EXAM: PORTABLE CHEST 1 VIEW COMPARISON:  None. FINDINGS: Lung volumes are low.The cardiomediastinal contours are normal. Vague opacity at the left lung base. Pulmonary vasculature is normal. No pleural effusion or pneumothorax. No acute osseous abnormalities are seen. IMPRESSION: Low lung volumes. Vague opacity at the left lung base, may represent atelectasis or COVID pneumonia. Electronically Signed   By: Narda Rutherford M.D.   On: 09/01/2020 18:06    Telemetry: Personally reviewed.  Rhythm is normal sinus rhythm.  Assessment/Plan Principal Problem:   Diabetic ketoacidosis associated with type 2 diabetes mellitus (HCC)   Patient exhibiting metabolic acidosis with hyperglycemia in the setting of longstanding medication noncompliance.  Ongoing COVID-19 infection is likely worsening glycemic control.  Reports not taking anything any treatment for her diabetes at this point, has a history of having hemoglobin A1c greater than 12.  Current hemoglobin A1c pending.  Patient states that she does not want to be on insulin long-term because it has "almost killed her" in  the past.  Patient is agreeable to briefly use intravenous insulin during this hospitalization however.  Initiating intravenous insulin infusion  Hydrating patient aggressively with intravenous isotonic fluids  Patient currently n.p.o. until gap closes  Performing serial chemistries to monitor progression of anion gap closure  Placing patient in progressive unit with airborne and contact isolation due to active COVID-19 infection.  Active Problems:   COVID-19 virus infection  Currently not hypoxic or short of breath secondary to Covid infection  Patient does not warrant initiation of remdesivir or systemic steroids at this time   due to patient's history of uncontrolled diabetes, patient  warrants initiation of monoclonal antibody infusion to avoid progression of disease  Patient voices consent to monoclonal antibody infusion.  Airborne and contact isolation  Monitoring patient closely for any evidence of progression    Code Status:  Full code Family Communication: Gust patient's condition and plan of care with husband via phone conversation  Status is: Observation  The patient remains OBS appropriate and will d/c before 2 midnights.  Dispo: The patient is from: Home              Anticipated d/c is to: Home              Anticipated d/c date is: 2 days              Patient currently is not medically stable to d/c.        Marinda ElkGeorge J Jamariah Tony MD [facility] Pager [phone]- [phone]  If 7PM-7AM, please contact night-coverage [email or web address] Use universal [place] password for that web site. If you do not have the password, please call the hospital operator.  09/01/2020, 10:29 PM

## 2020-09-02 DIAGNOSIS — E1110 Type 2 diabetes mellitus with ketoacidosis without coma: Secondary | ICD-10-CM

## 2020-09-02 DIAGNOSIS — U071 COVID-19: Secondary | ICD-10-CM | POA: Diagnosis not present

## 2020-09-02 LAB — CBC WITH DIFFERENTIAL/PLATELET
Abs Immature Granulocytes: 0.03 10*3/uL (ref 0.00–0.07)
Basophils Absolute: 0 10*3/uL (ref 0.0–0.1)
Basophils Relative: 1 %
Eosinophils Absolute: 0 10*3/uL (ref 0.0–0.5)
Eosinophils Relative: 0 %
HCT: 33.3 % — ABNORMAL LOW (ref 36.0–46.0)
Hemoglobin: 10.8 g/dL — ABNORMAL LOW (ref 12.0–15.0)
Immature Granulocytes: 1 %
Lymphocytes Relative: 26 %
Lymphs Abs: 1 10*3/uL (ref 0.7–4.0)
MCH: 29.8 pg (ref 26.0–34.0)
MCHC: 32.4 g/dL (ref 30.0–36.0)
MCV: 91.7 fL (ref 80.0–100.0)
Monocytes Absolute: 0.4 10*3/uL (ref 0.1–1.0)
Monocytes Relative: 10 %
Neutro Abs: 2.5 10*3/uL (ref 1.7–7.7)
Neutrophils Relative %: 62 %
Platelets: 214 10*3/uL (ref 150–400)
RBC: 3.63 MIL/uL — ABNORMAL LOW (ref 3.87–5.11)
RDW: 11.8 % (ref 11.5–15.5)
WBC: 3.9 10*3/uL — ABNORMAL LOW (ref 4.0–10.5)
nRBC: 0 % (ref 0.0–0.2)

## 2020-09-02 LAB — BASIC METABOLIC PANEL
Anion gap: 12 (ref 5–15)
Anion gap: 7 (ref 5–15)
BUN: 10 mg/dL (ref 6–20)
BUN: 7 mg/dL (ref 6–20)
CO2: 16 mmol/L — ABNORMAL LOW (ref 22–32)
CO2: 18 mmol/L — ABNORMAL LOW (ref 22–32)
Calcium: 7.5 mg/dL — ABNORMAL LOW (ref 8.9–10.3)
Calcium: 7.2 mg/dL — ABNORMAL LOW (ref 8.9–10.3)
Chloride: 106 mmol/L (ref 98–111)
Chloride: 112 mmol/L — ABNORMAL HIGH (ref 98–111)
Creatinine, Ser: 0.61 mg/dL (ref 0.44–1.00)
Creatinine, Ser: 0.42 mg/dL — ABNORMAL LOW (ref 0.44–1.00)
GFR, Estimated: >60 mL/min (ref >60)
GFR, Estimated: >60 mL/min (ref >60)
Glucose, Bld: 268 mg/dL — ABNORMAL HIGH (ref 70–99)
Glucose, Bld: 148 mg/dL — ABNORMAL HIGH (ref 70–99)
Potassium: 4.1 mmol/L (ref 3.5–5.1)
Potassium: 3.6 mmol/L (ref 3.5–5.1)
Sodium: 134 mmol/L — ABNORMAL LOW (ref 135–145)
Sodium: 137 mmol/L (ref 135–145)

## 2020-09-02 LAB — CBG MONITORING, ED
Glucose-Capillary: 144 mg/dL — ABNORMAL HIGH (ref 70–99)
Glucose-Capillary: 168 mg/dL — ABNORMAL HIGH (ref 70–99)
Glucose-Capillary: 189 mg/dL — ABNORMAL HIGH (ref 70–99)
Glucose-Capillary: 196 mg/dL — ABNORMAL HIGH (ref 70–99)

## 2020-09-02 LAB — MAGNESIUM: Magnesium: 1.7 mg/dL (ref 1.7–2.4)

## 2020-09-02 LAB — C-REACTIVE PROTEIN: CRP: 4.3 mg/dL — ABNORMAL HIGH (ref <1.0)

## 2020-09-02 LAB — D-DIMER, QUANTITATIVE: D-Dimer, Quant: 0.32 ug/mL-FEU (ref 0.00–0.50)

## 2020-09-02 MED ORDER — "INSULIN SYRINGE-NEEDLE U-100 31G X 15/64"" 0.3 ML MISC": 0 refills | Status: DC

## 2020-09-02 MED ORDER — INSULIN ASPART 100 UNIT/ML ~~LOC~~ SOLN
3.0000 [IU] | Freq: Three times a day (TID) | SUBCUTANEOUS | Status: DC
  Administered 2020-09-02 (×2): 3 [IU] via SUBCUTANEOUS
  Filled 2020-09-02: qty 0.03

## 2020-09-02 MED ORDER — INSULIN GLARGINE 100 UNIT/ML ~~LOC~~ SOLN
8.0000 [IU] | Freq: Every day | SUBCUTANEOUS | Status: DC
  Administered 2020-09-02: 03:00:00 8 [IU] via SUBCUTANEOUS
  Filled 2020-09-02: qty 0.08

## 2020-09-02 MED ORDER — INSULIN GLARGINE 100 UNIT/ML ~~LOC~~ SOLN: 8.0000 [IU] | Freq: Every day | SUBCUTANEOUS | 0 refills | Status: DC

## 2020-09-02 MED ORDER — BLOOD GLUCOSE MONITOR KIT: PACK | 0 refills | Status: DC

## 2020-09-02 MED ORDER — METFORMIN HCL 1000 MG PO TABS: 1000.0000 mg | ORAL_TABLET | Freq: Two times a day (BID) | ORAL | 0 refills | Status: DC

## 2020-09-02 MED ORDER — INSULIN ASPART 100 UNIT/ML ~~LOC~~ SOLN
0.0000 [IU] | Freq: Three times a day (TID) | SUBCUTANEOUS | Status: DC
  Administered 2020-09-02 (×2): 2 [IU] via SUBCUTANEOUS
  Filled 2020-09-02: qty 0.09

## 2020-09-02 MED ORDER — SODIUM CHLORIDE 0.9 % IV BOLUS
1000.0000 mL | Freq: Once | INTRAVENOUS | Status: AC
  Administered 2020-09-02: 03:00:00 1000 mL via INTRAVENOUS

## 2020-09-02 NOTE — Discharge Instructions (Signed)
Person Under Monitoring Name: Shelby Brown  Location: [address] [place] [address]   Infection Prevention Recommendations for Individuals Confirmed to have, or Being Evaluated for, 2019 Novel Coronavirus (COVID-19) Infection Who Receive Care at Home  Individuals who are confirmed to have, or are being evaluated for, COVID-19 should follow the prevention steps below until a healthcare provider or local or state health department says they can return to normal activities.  Stay home except to get medical care You should restrict activities outside your home, except for getting medical care. Do not go to work, school, or public areas, and do not use public transportation or taxis.  Call ahead before visiting your doctor Before your medical appointment, call the healthcare provider and tell them that you have, or are being evaluated for, COVID-19 infection. This will help the healthcare providers office take steps to keep other people from getting infected. Ask your healthcare provider to call the local or state health department.  Monitor your symptoms Seek prompt medical attention if your illness is worsening (e.g., difficulty breathing). Before going to your medical appointment, call the healthcare provider and tell them that you have, or are being evaluated for, COVID-19 infection. Ask your healthcare provider to call the local or state health department.  Wear a facemask You should wear a facemask that covers your nose and mouth when you are in the same room with other people and when you visit a healthcare provider. People who live with or visit you should also wear a facemask while they are in the same room with you.  Separate yourself from other people in your home As much as possible, you should stay in a different room from other people in your home. Also, you should use a separate bathroom, if available.  Avoid sharing household items You should  not share dishes, drinking glasses, cups, eating utensils, towels, bedding, or other items with other people in your home. After using these items, you should wash them thoroughly with soap and water.  Cover your coughs and sneezes Cover your mouth and nose with a tissue when you cough or sneeze, or you can cough or sneeze into your sleeve. Throw used tissues in a lined trash can, and immediately wash your hands with soap and water for at least 20 seconds or use an alcohol-based hand rub.  Wash your [employer] your hands often and thoroughly with soap and water for at least 20 seconds. You can use an alcohol-based hand sanitizer if soap and water are not available and if your hands are not visibly dirty. Avoid touching your eyes, nose, and mouth with unwashed hands.   Prevention Steps for Caregivers and Household Members of Individuals Confirmed to have, or Being Evaluated for, COVID-19 Infection Being Cared for in the Home  If you live with, or provide care at home for, a person confirmed to have, or being evaluated for, COVID-19 infection please follow these guidelines to prevent infection:  Follow healthcare providers instructions Make sure that you understand and can help the patient follow any healthcare provider instructions for all care.  Provide for the patients basic needs You should help the patient with basic needs in the home and provide support for getting groceries, prescriptions, and other personal needs.  Monitor the patients symptoms If they are getting sicker, call his or her medical provider and tell them that the patient has, or is being evaluated for, COVID-19 infection. This will help the healthcare providers office take  steps to keep other people from getting infected. Ask the healthcare provider to call the local or state health department.  Limit the number of people who have contact with the patient  If possible, have only one caregiver for the  patient.  Other household members should stay in another home or place of residence. If this is not possible, they should stay  in another room, or be separated from the patient as much as possible. Use a separate bathroom, if available.  Restrict visitors who do not have an essential need to be in the home.  Keep older adults, very young children, and other sick people away from the patient Keep older adults, very young children, and those who have compromised immune systems or chronic health conditions away from the patient. This includes people with chronic heart, lung, or kidney conditions, diabetes, and cancer.  Ensure good ventilation Make sure that shared spaces in the home have good air flow, such as from an air conditioner or an opened window, weather permitting.  Wash your hands often  Wash your hands often and thoroughly with soap and water for at least 20 seconds. You can use an alcohol based hand sanitizer if soap and water are not available and if your hands are not visibly dirty.  Avoid touching your eyes, nose, and mouth with unwashed hands.  Use disposable paper towels to dry your hands. If not available, use dedicated cloth towels and replace them when they become wet.  Wear a facemask and gloves  Wear a disposable facemask at all times in the room and gloves when you touch or have contact with the patients blood, body fluids, and/or secretions or excretions, such as sweat, saliva, sputum, nasal mucus, vomit, urine, or feces.  Ensure the mask fits over your nose and mouth tightly, and do not touch it during use.  Throw out disposable facemasks and gloves after using them. Do not reuse.  Wash your hands immediately after removing your facemask and gloves.  If your personal clothing becomes contaminated, carefully remove clothing and launder. Wash your hands after handling contaminated clothing.  Place all used disposable facemasks, gloves, and other waste in a lined  container before disposing them with other household waste.  Remove gloves and wash your hands immediately after handling these items.  Do not share dishes, glasses, or other household items with the patient  Avoid sharing household items. You should not share dishes, drinking glasses, cups, eating utensils, towels, bedding, or other items with a patient who is confirmed to have, or being evaluated for, COVID-19 infection.  After the person uses these items, you should wash them thoroughly with soap and water.  Wash laundry thoroughly  Immediately remove and wash clothes or bedding that have blood, body fluids, and/or secretions or excretions, such as sweat, saliva, sputum, nasal mucus, vomit, urine, or feces, on them.  Wear gloves when handling laundry from the patient.  Read and follow directions on labels of laundry or clothing items and detergent. In general, wash and dry with the warmest temperatures recommended on the label.  Clean all areas the individual has used often  Clean all touchable surfaces, such as counters, tabletops, doorknobs, bathroom fixtures, toilets, phones, keyboards, tablets, and bedside tables, every day. Also, clean any surfaces that may have blood, body fluids, and/or secretions or excretions on them.  Wear gloves when cleaning surfaces the patient has come in contact with.  Use a diluted bleach solution (e.g., dilute bleach with 1 part bleach  and 10 parts water) or a household disinfectant with a label that says EPA-registered for coronaviruses. To make a bleach solution at home, add 1 tablespoon of bleach to 1 quart (4 cups) of water. For a larger supply, add  cup of bleach to 1 gallon (16 cups) of water.  Read labels of cleaning products and follow recommendations provided on product labels. Labels contain instructions for safe and effective use of the cleaning product including precautions you should take when applying the product, such as wearing gloves or  eye protection and making sure you have good ventilation during use of the product.  Remove gloves and wash hands immediately after cleaning.  Monitor yourself for signs and symptoms of illness Caregivers and household members are considered close contacts, should monitor their health, and will be asked to limit movement outside of the home to the extent possible. Follow the monitoring steps for close contacts listed on the symptom monitoring form.   ? If you have additional questions, contact your local health department or call the epidemiologist on call at [phone] (available 24/7). ? This guidance is subject to change. For the most up-to-date guidance from [facility], please refer to their website: [email or web address]

## 2020-09-02 NOTE — ED Notes (Signed)
Pt given lunch tray.

## 2020-09-02 NOTE — Discharge Planning (Signed)
  MATCH Medication Assistance Card Name: Tehillah Cipriani ID (MRN): [id number] Bin: [id number] RX Group: [id number] Discharge Date: 09/02/20  Expiration Date:09/10/20                                           (must be filled within 7 days of discharge)     You have been approved to have the prescriptions written by your discharging physician filled through our [facility] (Medication Assistance Through [facility]) program. This program allows for a one-time (no refills) 34-day supply of selected medications for a low copay amount.  The copay is $3.00 per prescription. For instance, if you have one prescription, you will pay $3.00; for two prescriptions, you pay $6.00; for three prescriptions, you pay $9.00; and so on.  Only certain pharmacies are participating in this program with [facility]. You will need to select one of the pharmacies from the attached list and take your prescriptions, this letter, and your photo ID to one of the [facility], [facility] pharmac[facility], [facility], [facility] at [address], or [facility] [address].   We are excited that you are able to use the [facility] program to get your medications. These prescriptions must be filled within 7 days of hospital discharge or they will no longer be valid for the [facility] program. Should you have any problems with your prescriptions please contact your case management team member at [phone] for [place].  Thank you, [facility]

## 2020-09-02 NOTE — ED Notes (Signed)
Pt given breakfast tray

## 2020-09-02 NOTE — Progress Notes (Signed)
..   Transition of Care [facility]) - Emergency Department Mini Assessment   Patient Details  Name: Shelby Brown MRN: [id number] Date of Birth: September 09, 1981  [facility] CM/SW Contact:    Lossie Faes Tarpley-Carter, LCSWA Phone Number: 09/02/2020, 10:58 AM   Clinical Narrative: [facility] CM/CSW consulted with pt with Shelby Brown, Interpreter.  Pt was asked about her needs.  CSW let her know she could accommodate.  CSW also disclosed the process upon dc of how to obtain her medications and to go to [facility] [employer].  Pt agreed and understood the process.  Ekansh Sherk Tarpley-Carter, MSW, LCSW-A Pronouns:  She, Her, Hers                  [facility] ED Transitions of CareClinical Social Worker [email or web address] [phone]     ED Mini Assessment:    Barriers to Discharge: No Barriers Identified     Means of departure: Not know  Interventions which prevented an admission or readmission: Follow-up medical appointment,Other (must enter comment) (Match letter for meds)    Patient Contact and Communications        ,          Patient states their goals for this hospitalization and ongoing recovery are:: Assistance with COVID symptoms.      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 COVID-19 virus infection 09/01/2020  . Abdominal pain, chronic, epigastric 03/12/2018  . MDD (major depressive disorder) 11/01/2017  . Intentional metformin overdose (HCC) 10/27/2017  . Adjustment disorder with mixed anxiety and depressed mood 10/27/2017  . DYSLIPIDEMIA 12/10/2007  . Diabetes type 2, uncontrolled (HCC) 08/07/2006   PCP:  Patient, No Pcp Per Pharmacy:   [facility] [address], [place] - [address] [place] [address] Phone: [phone] Fax: [phone]

## 2020-09-02 NOTE — Discharge Summary (Signed)
Shelby Brown, is a 39 y.o. female  DOB 1980/11/03  MRN [id number].  Admission date:  09/01/2020  Admitting Physician  Vernelle Emerald, MD  Discharge Date:  09/02/2020   Primary MD  Patient, No Pcp Per  Recommendations for primary care physician for things to follow:  -Patient to start following with [facility] after she finished her COVID-19 quarantine.. -Adjust diabetes regimen as indicated.  Admission Diagnosis  Diabetic ketoacidosis associated with type 2 diabetes mellitus ([place]) [E11.10] COVID-19 virus infection [U07.1]   Discharge Diagnosis  Diabetic ketoacidosis associated with type 2 diabetes mellitus ([place]) [E11.10] COVID-19 virus infection [U07.1]   Principal Problem:   Diabetic ketoacidosis associated with type 2 diabetes mellitus ([name]) Active Problems:   COVID-19 virus infection      Past Medical History:  Diagnosis Date  . Diabetes mellitus without complication [facility])     Past Surgical History:  Procedure Laterality Date  . CESAREAN SECTION    . TUBAL LIGATION         History of present illness and  Hospital Course:     Kindly see H&P for history of present illness and admission details, please review complete Labs, Consult reports and Test reports for all details in brief   Tele interpreter was used throughout interaction #[id number]  HPI  from the history and physical done on the day of admission 09/01/2020  39 year old Spanish-speaking female with past medical history of diabetes mellitus type 2, medical noncompliance presenting with complaints of malaise and generalized weakness.  Patient explains that for the past several days she has been experiencing increasingly worsening generalized weakness and fatigue.  Patient explains that in the past several days her daughter and niece have both fallen ill.  Patient explains that in the past several days  she has experienced a dry nonproductive cough but denies associated shortness of breath.  Patient complains of associated poor appetite and generalized weakness.  Generalized weakness has become particularly severe patient does complain of some intermittently watery diarrhea but that resolved several days ago.  Patient denies any active fevers, nausea or vomiting.  Patient is complaining of some intermittent generalized abdominal cramping.  Generalized weakness in particular has become quite severe in the past several days, worse with exertion and improved with rest.  Due to worsening symptoms patient eventually presented to [facility] emergency department for evaluation.  Upon evaluation at [facility] emergency department, patient was initially found to be hypotensive with a systolic blood pressure as low as 72/54.  Patient has been found to be hyperglycemic with metabolic acidosis concerning for DKA.  The hospitalist group is now been called to assess the patient for admission to the hospital.   Hospital Course   Diabetes mellitus, type II, poorly controlled with DKA -Known history of diabetes mellitus, and poor compliance in the past, reports she has not been on any diabetic regimen for few years currently, her A1c was 12.7 this admission, she was in DKA with increased anion gap, patient was started on  insulin drip, her anion gap has closed, so she was transitioned to Lantus and sliding scale, with good control, is tolerating her diet, I have discussed at length and in details via interpreter about importance of compliance, she will be discharged on Lantus 8 units and Metformin. -Cussed with TOC to arrange for outpatient follow-up with [facility].  COVID-19 virus infection -No hypoxia, no for pneumonia, no indication for remdesivir on steroids, qualifies for monoclonal antibody given her uncontrolled diabetes, she received monoclonal antibody overnight in ED  department. -Discussed and explained via tele interpreter at length with the patient, she may be  early in her disease, she may develop Covid pneumonia and respiratory symptoms later in the disease, and she is to seek immediate medical attention if she develops any shortness of breath .  Discharge Condition:  stable     Discharge Instructions  and  Discharge Medications    Discharge Instructions    Discharge instructions   Complete by: As directed    Follow with [facility] after you finish quarantine.  Get CBC, CMP   Activity: As tolerated with Full fall precautions use walker/cane & assistance as needed   Disposition Home    Diet: carb modified   On your next visit with your primary care physician please Get Medicines reviewed and adjusted.   Please request your Prim.MD to go over all Hospital Tests and Procedure/Radiological results at the follow up, please get all Hospital records sent to your Prim MD by signing hospital release before you go home.   If you experience worsening of your admission symptoms, develop shortness of breath, life threatening emergency, suicidal or homicidal thoughts you must seek medical attention immediately by calling 911 or calling your MD immediately  if symptoms less severe.  You Must read complete instructions/literature along with all the possible adverse reactions/side effects for all the Medicines you take and that have been prescribed to you. Take any new Medicines after you have completely understood and accpet all the possible adverse reactions/side effects.   Do not drive, operating heavy machinery, perform activities at heights, swimming or participation in water activities or provide baby sitting services if your were admitted for syncope or siezures until you have seen by Primary MD or a Neurologist and advised to do so again.  Do not drive when taking Pain medications.    Do not take more than prescribed Pain, Sleep  and Anxiety Medications  Special Instructions: If you have smoked or chewed Tobacco  in the last 2 yrs please stop smoking, stop any regular Alcohol  and or any Recreational drug use.  Wear Seat belts while driving.   Please note  You were cared for by a hospitalist during your hospital stay. If you have any questions about your discharge medications or the care you received while you were in the hospital after you are discharged, you can call the unit and asked to speak with the hospitalist on call if the hospitalist that took care of you is not available. Once you are discharged, your primary care physician will handle any further medical issues. Please note that NO REFILLS for any discharge medications will be authorized once you are discharged, as it is imperative that you return to your primary care physician (or establish a relationship with a primary care physician if you do not have one) for your aftercare needs so that they can reassess your need for medications and monitor your lab values.   Increase activity slowly  Complete by: As directed      Allergies as of 09/02/2020   No Known Allergies     Medication List    STOP taking these medications   citalopram 20 MG tablet Commonly known as: CELEXA   glimepiride 4 MG tablet Commonly known as: AMARYL   glucose blood test strip   hydrOXYzine 25 MG tablet Commonly known as: ATARAX/VISTARIL   insulin aspart 100 UNIT/ML injection Commonly known as: novoLOG   ReliOn Prime Monitor Devi   ReliOn Ultra Thin Lancets 30G Misc   traZODone 100 MG tablet Commonly known as: DESYREL     TAKE these medications   blood glucose meter kit and supplies Kit Dispense based on patient and insurance preference. Use up to four times daily as directed. (FOR ICD-9 250.00, 250.01).   insulin glargine 100 UNIT/ML injection Commonly known as: LANTUS Inject 0.08 mLs (8 Units total) into the skin daily. What changed:   how much to  take  when to take this   Insulin Syringe-Needle U-100 31G X 15/64" 0.3 ML Misc Use twice daily to administer insulin   metFORMIN 1000 MG tablet Commonly known as: GLUCOPHAGE Take 1 tablet (1,000 mg total) by mouth 2 (two) times daily with a meal.         Diet and Activity recommendation: See Discharge Instructions above   Consults obtained -  None   Major procedures and Radiology Reports - PLEASE review detailed and final reports for all details, in brief -      DG Chest Port 1 View  Result Date: 09/01/2020 CLINICAL DATA:  COVID. Body aches. EXAM: PORTABLE CHEST 1 VIEW COMPARISON:  None. FINDINGS: Lung volumes are low.The cardiomediastinal contours are normal. Vague opacity at the left lung base. Pulmonary vasculature is normal. No pleural effusion or pneumothorax. No acute osseous abnormalities are seen. IMPRESSION: Low lung volumes. Vague opacity at the left lung base, may represent atelectasis or COVID pneumonia. Electronically Signed   By: Keith Rake M.D.   On: 09/01/2020 18:06    Micro Results    Recent Results (from the past 240 hour(s))  Resp Panel by RT-PCR (Flu A&B, Covid) Nasopharyngeal Swab     Status: Abnormal   Collection Time: 09/01/20  6:00 PM   Specimen: Nasopharyngeal Swab; Nasopharyngeal(NP) swabs in vial transport medium  Result Value Ref Range Status   SARS Coronavirus 2 by RT PCR POSITIVE (A) NEGATIVE Final    Comment: RESULT CALLED TO, READ BACK BY AND VERIFIED WITH: SMITH,J. RN _0  09/01/20 BILLINGSLEY,L (NOTE) SARS-CoV-2 target nucleic acids are DETECTED.  The SARS-CoV-2 RNA is generally detectable in upper respiratory specimens during the acute phase of infection. Positive results are indicative of the presence of the identified virus, but do not rule out bacterial infection or co-infection with other pathogens not detected by the test. Clinical correlation with patient history and other diagnostic information is necessary to  determine patient infection status. The expected result is Negative.  Fact Sheet for Patients: EntrepreneurPulse.com.au  Fact Sheet for Healthcare Providers: IncredibleEmployment.be  This test is not yet approved or cleared by the [place] FDA and  has been authorized for detection and/or diagnosis of SARS-CoV-2 by FDA under an Emergency Use Authorization (EUA).  This EUA will remain in effect (meaning this test  can be used) for the duration of  the COVID-19 declaration under Section 564(b)(1) of the Act, 21 U.S.C. section 360bbb-3(b)(1), unless the authorization is terminated or revoked sooner.     Influenza A by  PCR NEGATIVE NEGATIVE Final   Influenza B by PCR NEGATIVE NEGATIVE Final    Comment: (NOTE) The Xpert Xpress SARS-CoV-2/FLU/RSV plus assay is intended as an aid in the diagnosis of influenza from Nasopharyngeal swab specimens and should not be used as a sole basis for treatment. Nasal washings and aspirates are unacceptable for Xpert Xpress SARS-CoV-2/FLU/RSV testing.  Fact Sheet for Patients: EntrepreneurPulse.com.au  Fact Sheet for Healthcare Providers: IncredibleEmployment.be  This test is not yet approved or cleared by the [place] FDA and has been authorized for detection and/or diagnosis of SARS-CoV-2 by FDA under an Emergency Use Authorization (EUA). This EUA will remain in effect (meaning this test can be used) for the duration of the COVID-19 declaration under Section 564(b)(1) of the Act, 21 U.S.C. section 360bbb-3(b)(1), unless the authorization is terminated or revoked.  Performed at [facility], [place] [address], [place], [place] [address]        Today   Subjective:   Warrick Parisian today ports some headache, reports appetite has improved, tolerating oral intake, with no nausea, no vomiting, no shortness of breath .   Objective:    Blood pressure 130/75, pulse 87, temperature 100 F (37.8 C), temperature source Oral, resp. rate 19, height 5' 3" (1.6 m), weight 52.6 kg, last menstrual period 08/07/2020, SpO2 98 %.  No intake or output data in the 24 hours ending 09/02/20 1040  Exam Awake Alert, Oriented x 3, No new F.N deficits, Normal affect Symmetrical Chest wall movement, Good air movement bilaterally, CTAB RRR +ve B.Sounds, Abd Soft, Non tender, No rebound -guarding or rigidity. No Cyanosis, Clubbing or edema, No new Rash or bruise  Data Review   CBC w Diff:  Lab Results  Component Value Date   WBC 3.9 (L) 09/02/2020   HGB 10.8 (L) 09/02/2020   HCT 33.3 (L) 09/02/2020   PLT 214 09/02/2020   LYMPHOPCT 26 09/02/2020   MONOPCT 10 09/02/2020   EOSPCT 0 09/02/2020   BASOPCT 1 09/02/2020    CMP:  Lab Results  Component Value Date   NA 137 09/02/2020   NA 134 03/12/2018   K 3.6 09/02/2020   CL 112 (H) 09/02/2020   CO2 18 (L) 09/02/2020   BUN 7 09/02/2020   BUN 14 03/12/2018   CREATININE 0.42 (L) 09/02/2020   CREATININE 0.62 01/06/2014   PROT 8.2 (H) 09/01/2020   PROT 7.4 03/12/2018   ALBUMIN 3.8 09/01/2020   ALBUMIN 4.3 03/12/2018   BILITOT 0.8 09/01/2020   BILITOT 0.9 03/12/2018   ALKPHOS 71 09/01/2020   AST 49 (H) 09/01/2020   ALT 39 09/01/2020  .   Total Time in preparing paper work, data evaluation and todays exam - 61 minutes  Phillips Climes M.D on 09/02/2020 at 10:40 AM  [facility]   Office  [phone]

## 2020-09-02 NOTE — Discharge Planning (Signed)
   MATCH Medication Assistance Card    Zollie BeckersDorthia Tout ID (MRN):  [id number]  BIN: [id number] RX Group: [id number]  Franco Nones de alta:09/02/20  Franco Nones de expiracion:09/10/20   debe ser llenado dentro de los 7 das de la aprobacin de la gestin   Usted ha sido aprobado para tener sus recetas llenas de descarga a travs de nuestro partido ([employer] Salud a travs) del Radio broadcast assistant. Este programa permite una sola vez (sin Print production planner) suministro de [address] de medicamentos seleccionados por un monto bajo copago.  El copago es de $ 3.00 por receta. Por ejemplo, si usted tiene una receta, usted pagar $ 3,00; [employer], usted paga $ 6,00; para tres recetas, usted paga $ 9.00; etctera.  Slo ciertas farmacias estn participando en este programa con [employer]. Usted tendr [employer] elegir una de las farmacias de la lista adjunta y tomar sus medicamentos, esta carta y su identidad con fotografa a una de las [employer].  Estamos muy contentos de que usted es capaz de Chemical engineer el programa del partido para obtener sus medicamentos. Estas recetas se deben surtir Arts administrator de 7 das del alta hospitalaria o que ya no sern vlidos para el programa de partido. [place] de su equipo de gestin de [place] en [phone] Si tiene algn problema con sus recetas por favor pngase en contacto.  [facility]

## 2020-09-02 NOTE — Progress Notes (Signed)
   09/02/20 1104  TOC ED Mini Assessment  TOC Time spent with patient (minutes): 30  PING Used in TOC Assessment No  Admission or Readmission Diverted Yes  Interventions which prevented an admission or readmission Medication Review;Follow-up medical appointment  What brought you to the Emergency Department?  "sick"  Barriers to Discharge ED Uninsured needing PCP establishment;ED Uninsured needing medication assistance  Barrier interventions [facility] and [facility]  Means of departure Car  [facility] consulted regarding medication assistance for uninsured patient. RNCM reviewed chart and spoke with the pt about [facility] MATCH program ($3 co pay for each Rx through [facility] program, does not include refills, 7 day expiration of MATCH letter and choice of pharmacies). Pt is eligible for [facility] MATCH program (unable to find pt listed in PROCARE per cardholder name inquiry) and has agreed to accept MATCH under terms discussed. PROCARE information entered. MATCH letter completed and provided to pt.  RNCM updated EDP and ED RN.   RNCM also confirmed that pt does not have PCP. RNCM set-up follow up appointment at [facility] on 09/22/20 at 1:30.  Pt advised to of appointment date, time, address and phone number of [phone].

## 2020-09-06 LAB — CULTURE, BLOOD (ROUTINE X 2)
Culture: NO GROWTH
Special Requests: ADEQUATE

## 2020-09-08 ENCOUNTER — Emergency Department (HOSPITAL_COMMUNITY): Payer: Self-pay

## 2020-09-08 ENCOUNTER — Inpatient Hospital Stay (HOSPITAL_COMMUNITY)
Admission: EM | Admit: 2020-09-08 | Discharge: 2020-09-13 | DRG: 637 | Disposition: A | Discharge status: Home / Self Care | Payer: HRSA Program | Attending: Internal Medicine | Admitting: Internal Medicine

## 2020-09-08 ENCOUNTER — Encounter (HOSPITAL_COMMUNITY): Payer: Self-pay | Admitting: *Deleted

## 2020-09-08 ENCOUNTER — Other Ambulatory Visit: Payer: Self-pay

## 2020-09-08 DIAGNOSIS — E785 Hyperlipidemia, unspecified: Secondary | ICD-10-CM | POA: Diagnosis present

## 2020-09-08 DIAGNOSIS — J159 Unspecified bacterial pneumonia: Secondary | ICD-10-CM | POA: Diagnosis present

## 2020-09-08 DIAGNOSIS — F4323 Adjustment disorder with mixed anxiety and depressed mood: Secondary | ICD-10-CM | POA: Diagnosis present

## 2020-09-08 DIAGNOSIS — E1110 Type 2 diabetes mellitus with ketoacidosis without coma: Secondary | ICD-10-CM | POA: Diagnosis not present

## 2020-09-08 DIAGNOSIS — K2091 Esophagitis, unspecified with bleeding: Secondary | ICD-10-CM | POA: Diagnosis present

## 2020-09-08 DIAGNOSIS — J181 Lobar pneumonia, unspecified organism: Secondary | ICD-10-CM | POA: Diagnosis present

## 2020-09-08 DIAGNOSIS — F332 Major depressive disorder, recurrent severe without psychotic features: Secondary | ICD-10-CM | POA: Diagnosis not present

## 2020-09-08 DIAGNOSIS — E1165 Type 2 diabetes mellitus with hyperglycemia: Secondary | ICD-10-CM

## 2020-09-08 DIAGNOSIS — E875 Hyperkalemia: Secondary | ICD-10-CM | POA: Diagnosis present

## 2020-09-08 DIAGNOSIS — J1282 Pneumonia due to coronavirus disease 2019: Secondary | ICD-10-CM | POA: Diagnosis present

## 2020-09-08 DIAGNOSIS — F329 Major depressive disorder, single episode, unspecified: Secondary | ICD-10-CM | POA: Diagnosis present

## 2020-09-08 DIAGNOSIS — Z9119 Patient's noncompliance with other medical treatment and regimen: Secondary | ICD-10-CM

## 2020-09-08 DIAGNOSIS — Z833 Family history of diabetes mellitus: Secondary | ICD-10-CM

## 2020-09-08 DIAGNOSIS — U071 COVID-19: Secondary | ICD-10-CM | POA: Diagnosis not present

## 2020-09-08 DIAGNOSIS — E876 Hypokalemia: Secondary | ICD-10-CM | POA: Diagnosis present

## 2020-09-08 DIAGNOSIS — N179 Acute kidney failure, unspecified: Secondary | ICD-10-CM | POA: Diagnosis present

## 2020-09-08 DIAGNOSIS — D72829 Elevated white blood cell count, unspecified: Secondary | ICD-10-CM

## 2020-09-08 DIAGNOSIS — Z794 Long term (current) use of insulin: Secondary | ICD-10-CM

## 2020-09-08 DIAGNOSIS — E870 Hyperosmolality and hypernatremia: Secondary | ICD-10-CM | POA: Diagnosis present

## 2020-09-08 DIAGNOSIS — J189 Pneumonia, unspecified organism: Secondary | ICD-10-CM

## 2020-09-08 HISTORY — DX: Lobar pneumonia, unspecified organism: J18.1

## 2020-09-08 LAB — BLOOD GAS, VENOUS
Bicarbonate: 3.7 mmol/L — ABNORMAL LOW (ref 20.0–28.0)
O2 Saturation: 94.6 %
Patient temperature: 98.6
pCO2, Ven: 21.3 mmHg — ABNORMAL LOW (ref 44.0–60.0)
pH, Ven: 6.87 — CL (ref 7.250–7.430)
pO2, Ven: 132 mmHg — ABNORMAL HIGH (ref 32.0–45.0)

## 2020-09-08 LAB — GLUCOSE, CAPILLARY
Glucose-Capillary: 379 mg/dL — ABNORMAL HIGH (ref 70–99)
Glucose-Capillary: 331 mg/dL — ABNORMAL HIGH (ref 70–99)
Glucose-Capillary: 275 mg/dL — ABNORMAL HIGH (ref 70–99)

## 2020-09-08 LAB — COMPREHENSIVE METABOLIC PANEL
ALT: 15 U/L (ref 0–44)
AST: 17 U/L (ref 15–41)
Albumin: 3.5 g/dL (ref 3.5–5.0)
Alkaline Phosphatase: 72 U/L (ref 38–126)
BUN: 18 mg/dL (ref 6–20)
CO2: <7 mmol/L — ABNORMAL LOW (ref 22–32)
Calcium: 9.1 mg/dL (ref 8.9–10.3)
Chloride: 110 mmol/L (ref 98–111)
Creatinine, Ser: 1.42 mg/dL — ABNORMAL HIGH (ref 0.44–1.00)
GFR, Estimated: 48 mL/min — ABNORMAL LOW (ref >60)
Glucose, Bld: 527 mg/dL (ref 70–99)
Potassium: 5.6 mmol/L — ABNORMAL HIGH (ref 3.5–5.1)
Sodium: 145 mmol/L (ref 135–145)
Total Bilirubin: 1.5 mg/dL — ABNORMAL HIGH (ref 0.3–1.2)
Total Protein: 8.5 g/dL — ABNORMAL HIGH (ref 6.5–8.1)

## 2020-09-08 LAB — CBC WITH DIFFERENTIAL/PLATELET
Abs Immature Granulocytes: 0.32 10*3/uL — ABNORMAL HIGH (ref 0.00–0.07)
Basophils Absolute: 0.1 10*3/uL (ref 0.0–0.1)
Basophils Relative: 0 %
Eosinophils Absolute: 0 10*3/uL (ref 0.0–0.5)
Eosinophils Relative: 0 %
HCT: 44.3 % (ref 36.0–46.0)
Hemoglobin: 13 g/dL (ref 12.0–15.0)
Immature Granulocytes: 2 %
Lymphocytes Relative: 6 %
Lymphs Abs: 1.2 10*3/uL (ref 0.7–4.0)
MCH: 29.5 pg (ref 26.0–34.0)
MCHC: 29.3 g/dL — ABNORMAL LOW (ref 30.0–36.0)
MCV: 100.7 fL — ABNORMAL HIGH (ref 80.0–100.0)
Monocytes Absolute: 0.5 10*3/uL (ref 0.1–1.0)
Monocytes Relative: 3 %
Neutro Abs: 17 10*3/uL — ABNORMAL HIGH (ref 1.7–7.7)
Neutrophils Relative %: 89 %
Platelets: 582 10*3/uL — ABNORMAL HIGH (ref 150–400)
RBC: 4.4 MIL/uL (ref 3.87–5.11)
RDW: 12.2 % (ref 11.5–15.5)
WBC: 19.1 10*3/uL — ABNORMAL HIGH (ref 4.0–10.5)
nRBC: 0 % (ref 0.0–0.2)

## 2020-09-08 LAB — LACTATE DEHYDROGENASE: LDH: 181 U/L (ref 98–192)

## 2020-09-08 LAB — URINALYSIS, ROUTINE W REFLEX MICROSCOPIC
Bilirubin Urine: NEGATIVE
Glucose, UA: >=500 mg/dL — AB
Ketones, ur: 80 mg/dL — AB
Leukocytes,Ua: NEGATIVE
Nitrite: NEGATIVE
Protein, ur: 100 mg/dL — AB
RBC / HPF: >50 RBC/hpf — ABNORMAL HIGH (ref 0–5)
Specific Gravity, Urine: 1.018 (ref 1.005–1.030)
pH: 5 (ref 5.0–8.0)

## 2020-09-08 LAB — BASIC METABOLIC PANEL
Anion gap: 30 — ABNORMAL HIGH (ref 5–15)
Anion gap: 28 — ABNORMAL HIGH (ref 5–15)
BUN: 21 mg/dL — ABNORMAL HIGH (ref 6–20)
BUN: 19 mg/dL (ref 6–20)
CO2: <7 mmol/L — ABNORMAL LOW (ref 22–32)
CO2: <7 mmol/L — ABNORMAL LOW (ref 22–32)
Calcium: 8.6 mg/dL — ABNORMAL LOW (ref 8.9–10.3)
Calcium: 8.8 mg/dL — ABNORMAL LOW (ref 8.9–10.3)
Chloride: 113 mmol/L — ABNORMAL HIGH (ref 98–111)
Chloride: 114 mmol/L — ABNORMAL HIGH (ref 98–111)
Creatinine, Ser: 1.15 mg/dL — ABNORMAL HIGH (ref 0.44–1.00)
Creatinine, Ser: 1.12 mg/dL — ABNORMAL HIGH (ref 0.44–1.00)
GFR, Estimated: >60 mL/min (ref >60)
GFR, Estimated: >60 mL/min (ref >60)
Glucose, Bld: 519 mg/dL (ref 70–99)
Glucose, Bld: 413 mg/dL — ABNORMAL HIGH (ref 70–99)
Potassium: 5.4 mmol/L — ABNORMAL HIGH (ref 3.5–5.1)
Potassium: 4.8 mmol/L (ref 3.5–5.1)
Sodium: 146 mmol/L — ABNORMAL HIGH (ref 135–145)
Sodium: 145 mmol/L (ref 135–145)

## 2020-09-08 LAB — FERRITIN: Ferritin: 109 ng/mL (ref 11–307)

## 2020-09-08 LAB — I-STAT CHEM 8, ED
BUN: 25 mg/dL — ABNORMAL HIGH (ref 6–20)
Calcium, Ion: 1.2 mmol/L (ref 1.15–1.40)
Chloride: 119 mmol/L — ABNORMAL HIGH (ref 98–111)
Creatinine, Ser: 0.7 mg/dL (ref 0.44–1.00)
Glucose, Bld: 532 mg/dL (ref 70–99)
HCT: 47 % — ABNORMAL HIGH (ref 36.0–46.0)
Hemoglobin: 16 g/dL — ABNORMAL HIGH (ref 12.0–15.0)
Potassium: 5.9 mmol/L — ABNORMAL HIGH (ref 3.5–5.1)
Sodium: 143 mmol/L (ref 135–145)
TCO2: 7 mmol/L — ABNORMAL LOW (ref 22–32)

## 2020-09-08 LAB — CBC
HCT: 37.7 % (ref 36.0–46.0)
Hemoglobin: 11.9 g/dL — ABNORMAL LOW (ref 12.0–15.0)
MCH: 29.6 pg (ref 26.0–34.0)
MCHC: 31.6 g/dL (ref 30.0–36.0)
MCV: 93.8 fL (ref 80.0–100.0)
Platelets: 526 10*3/uL — ABNORMAL HIGH (ref 150–400)
RBC: 4.02 MIL/uL (ref 3.87–5.11)
RDW: 12.2 % (ref 11.5–15.5)
WBC: 19.8 10*3/uL — ABNORMAL HIGH (ref 4.0–10.5)
nRBC: 0 % (ref 0.0–0.2)

## 2020-09-08 LAB — I-STAT BETA HCG BLOOD, ED (MC, WL, AP ONLY): I-stat hCG, quantitative: <5 m[IU]/mL (ref <5)

## 2020-09-08 LAB — CBG MONITORING, ED
Glucose-Capillary: 396 mg/dL — ABNORMAL HIGH (ref 70–99)
Glucose-Capillary: 437 mg/dL — ABNORMAL HIGH (ref 70–99)
Glucose-Capillary: 438 mg/dL — ABNORMAL HIGH (ref 70–99)

## 2020-09-08 LAB — TRIGLYCERIDES: Triglycerides: 194 mg/dL — ABNORMAL HIGH (ref <150)

## 2020-09-08 LAB — C-REACTIVE PROTEIN: CRP: 3.6 mg/dL — ABNORMAL HIGH (ref <1.0)

## 2020-09-08 LAB — LACTIC ACID, PLASMA
Lactic Acid, Venous: 3.5 mmol/L (ref 0.5–1.9)
Lactic Acid, Venous: 4.1 mmol/L (ref 0.5–1.9)

## 2020-09-08 LAB — BETA-HYDROXYBUTYRIC ACID: Beta-Hydroxybutyric Acid: >8 mmol/L — ABNORMAL HIGH (ref 0.05–0.27)

## 2020-09-08 LAB — D-DIMER, QUANTITATIVE: D-Dimer, Quant: >20 ug/mL-FEU — ABNORMAL HIGH (ref 0.00–0.50)

## 2020-09-08 LAB — FIBRINOGEN: Fibrinogen: 172 mg/dL — ABNORMAL LOW (ref 210–475)

## 2020-09-08 LAB — PROCALCITONIN: Procalcitonin: <0.1 ng/mL

## 2020-09-08 MED ORDER — DEXTROSE 50 % IV SOLN: 0.0000 mL | INTRAVENOUS | Status: DC | PRN

## 2020-09-08 MED ORDER — INSULIN REGULAR(HUMAN) IN NACL 100-0.9 UT/100ML-% IV SOLN
INTRAVENOUS | Status: DC
  Administered 2020-09-08: 7.5 [IU]/h via INTRAVENOUS
  Administered 2020-09-09: 06:00:00 10.5 [IU]/h via INTRAVENOUS
  Administered 2020-09-09: 4.4 [IU]/h via INTRAVENOUS
  Filled 2020-09-08 (×4): qty 100

## 2020-09-08 MED ORDER — ENOXAPARIN SODIUM 40 MG/0.4ML ~~LOC~~ SOLN
40.0000 mg | SUBCUTANEOUS | Status: DC
  Administered 2020-09-08: 23:00:00 40 mg via SUBCUTANEOUS
  Filled 2020-09-08: qty 0.4

## 2020-09-08 MED ORDER — LACTATED RINGERS IV SOLN: INTRAVENOUS | Status: DC

## 2020-09-08 MED ORDER — LACTATED RINGERS IV BOLUS: 20.0000 mL/kg | Freq: Once | INTRAVENOUS | Status: AC

## 2020-09-08 MED ORDER — DEXTROSE IN LACTATED RINGERS 5 % IV SOLN: INTRAVENOUS | Status: DC

## 2020-09-08 MED ORDER — LACTATED RINGERS IV BOLUS
20.0000 mL/kg | Freq: Once | INTRAVENOUS | Status: AC
  Administered 2020-09-08: 1040 mL via INTRAVENOUS

## 2020-09-08 MED ORDER — INSULIN REGULAR(HUMAN) IN NACL 100-0.9 UT/100ML-% IV SOLN: INTRAVENOUS | Status: DC

## 2020-09-08 MED ORDER — LACTATED RINGERS IV BOLUS
1000.0000 mL | Freq: Once | INTRAVENOUS | Status: AC
  Administered 2020-09-08: 17:00:00 1000 mL via INTRAVENOUS

## 2020-09-08 NOTE — ED Notes (Signed)
BAIR HUGGER APPLIED.

## 2020-09-08 NOTE — ED Triage Notes (Signed)
Generalized abd pain. covid + on 12/15. S/sy 1 week prior to diagnosis. Last insulin dose 12/15. [id number].

## 2020-09-08 NOTE — ED Provider Notes (Signed)
Shelby Brown Provider Note   CSN: [id number] Arrival date & time: 09/08/20  1606     History Chief Complaint  Patient presents with  . Covid Positive  . Hyperglycemia    Shelby Brown is a 39 y.o. female.  Presents to ER with concern for elevated blood sugar, Covid.  Patient reports she was having Covid symptoms few days prior to arrival on her last admission.  Recently admitted on 12/15 for DKA, Covid positive.  Since discharge, patient states she has not taken any insulin.  She is not clear why she does not take it.  States that she currently feels terrible, is having nausea, occasional vomiting, body aches, joint aches, low-grade fevers.  Feels very thirsty.  Utilized Art therapist.  Spanish-speaking only.  History limited due to acuity. HPI     Past Medical History:  Diagnosis Date  . Diabetes mellitus without complication [facility])     Patient Active Problem List   Diagnosis Date Noted  . DKA, type 2 ([place]) 09/08/2020  . Lobar pneumonia ([place]) 09/08/2020  . Diabetic ketoacidosis associated with type 2 diabetes mellitus ([place]) 09/01/2020  . COVID-19 virus infection 09/01/2020  . Abdominal pain, chronic, epigastric 03/12/2018  . MDD (major depressive disorder) 11/01/2017  . Intentional metformin overdose ([place]) 10/27/2017  . Adjustment disorder with mixed anxiety and depressed mood 10/27/2017  . DYSLIPIDEMIA 12/10/2007  . Diabetes type 2, uncontrolled ([place]) 08/07/2006    Past Surgical History:  Procedure Laterality Date  . CESAREAN SECTION    . TUBAL LIGATION       OB History   No obstetric history on file.     Family History  Problem Relation Age of Onset  . Cancer Father   . Diabetes Sister   . Diabetes Sister     Social History   Tobacco Use  . Smoking status: Never Smoker  . Smokeless tobacco: Never Used  Substance Use Topics  . Alcohol use: No  . Drug use: No    Home Medications Prior to  Admission medications   Medication Sig Start Date End Date Taking? Authorizing Provider  blood glucose meter kit and supplies KIT Dispense based on patient and insurance preference. Use up to four times daily as directed. (FOR ICD-9 250.00, 250.01). 09/02/20  Yes Elgergawy, Silver Huguenin, MD  Insulin Syringe-Needle U-100 31G X 15/64" 0.3 ML MISC Use twice daily to administer insulin 09/02/20  Yes Elgergawy, Silver Huguenin, MD  metFORMIN (GLUCOPHAGE) 1000 MG tablet Take 1 tablet (1,000 mg total) by mouth 2 (two) times daily with a meal. 09/02/20  Yes Elgergawy, Silver Huguenin, MD  insulin glargine (LANTUS) 100 UNIT/ML injection Inject 0.08 mLs (8 Units total) into the skin daily. Patient not taking: Reported on 09/08/2020 09/02/20   Elgergawy, Silver Huguenin, MD    Allergies    Patient has no known allergies.  Review of Systems   Review of Systems  Constitutional: Positive for chills, fatigue and fever.  HENT: Positive for sore throat. Negative for ear pain.   Eyes: Negative for pain and visual disturbance.  Respiratory: Positive for cough and shortness of breath.   Cardiovascular: Negative for chest pain and palpitations.  Gastrointestinal: Positive for nausea and vomiting. Negative for abdominal pain.  Genitourinary: Negative for dysuria and hematuria.  Musculoskeletal: Negative for arthralgias and back pain.  Skin: Negative for color change and rash.  Neurological: Negative for seizures and syncope.  All other systems reviewed and are negative.   Physical Exam Updated  Vital Signs BP 91/63   Pulse (!) 104   Temp (P) 99.14 F (37.3 C) (Bladder)   Resp (!) 25   Ht 5' 3" (1.6 m)   Wt (P) 45 kg   SpO2 98%   BMI (P) 17.57 kg/m   Physical Exam Vitals and nursing note reviewed.  Constitutional:      General: She is not in acute distress.    Appearance: She is well-developed and well-nourished.     Comments: Appears mildly ill, mildly lethargic but easily aroused  HENT:     Head: Normocephalic and  atraumatic.  Eyes:     Conjunctiva/sclera: Conjunctivae normal.  Cardiovascular:     Rate and Rhythm: Normal rate and regular rhythm.     Heart sounds: No murmur heard.   Pulmonary:     Comments: Tachypneic, lungs clear, speaks in full sentences Abdominal:     Palpations: Abdomen is soft.     Tenderness: There is no abdominal tenderness.  Musculoskeletal:        General: No edema.     Cervical back: Neck supple.  Skin:    General: Skin is warm and dry.  Neurological:     Mental Status: She is alert.     Comments: Alert, oriented, slightly lethargic but easily aroused, speaks in full sentences  Psychiatric:        Mood and Affect: Mood and affect normal.     ED Results / Procedures / Treatments   Labs (all labs ordered are listed, but only abnormal results are displayed) Labs Reviewed  LACTIC ACID, PLASMA - Abnormal; Notable for the following components:      Result Value   Lactic Acid, Venous 3.5 (*)    All other components within normal limits  LACTIC ACID, PLASMA - Abnormal; Notable for the following components:   Lactic Acid, Venous 4.1 (*)    All other components within normal limits  CBC WITH DIFFERENTIAL/PLATELET - Abnormal; Notable for the following components:   WBC 19.1 (*)    MCV 100.7 (*)    MCHC 29.3 (*)    Platelets 582 (*)    Neutro Abs 17.0 (*)    Abs Immature Granulocytes 0.32 (*)    All other components within normal limits  COMPREHENSIVE METABOLIC PANEL - Abnormal; Notable for the following components:   Potassium 5.6 (*)    CO2 <7 (*)    Glucose, Bld 527 (*)    Creatinine, Ser 1.42 (*)    Total Protein 8.5 (*)    Total Bilirubin 1.5 (*)    GFR, Estimated 48 (*)    All other components within normal limits  D-DIMER, QUANTITATIVE (NOT AT [facility]) - Abnormal; Notable for the following components:   D-Dimer, Quant >20.00 (*)    All other components within normal limits  TRIGLYCERIDES - Abnormal; Notable for the following components:    Triglycerides 194 (*)    All other components within normal limits  FIBRINOGEN - Abnormal; Notable for the following components:   Fibrinogen 172 (*)    All other components within normal limits  C-REACTIVE PROTEIN - Abnormal; Notable for the following components:   CRP 3.6 (*)    All other components within normal limits  BLOOD GAS, VENOUS - Abnormal; Notable for the following components:   pH, Ven 6.870 (*)    pCO2, Ven 21.3 (*)    pO2, Ven 132.0 (*)    Bicarbonate 3.7 (*)    All other components within normal limits  BASIC  METABOLIC PANEL - Abnormal; Notable for the following components:   Sodium 146 (*)    Potassium 5.4 (*)    Chloride 113 (*)    CO2 <7 (*)    Glucose, Bld 519 (*)    BUN 21 (*)    Creatinine, Ser 1.15 (*)    Calcium 8.6 (*)    Anion gap 30.0 (*)    All other components within normal limits  BASIC METABOLIC PANEL - Abnormal; Notable for the following components:   Chloride 114 (*)    CO2 <7 (*)    Glucose, Bld 413 (*)    Creatinine, Ser 1.12 (*)    Calcium 8.8 (*)    Anion gap 28.0 (*)    All other components within normal limits  BETA-HYDROXYBUTYRIC ACID - Abnormal; Notable for the following components:   Beta-Hydroxybutyric Acid >8.00 (*)    All other components within normal limits  URINALYSIS, ROUTINE W REFLEX MICROSCOPIC - Abnormal; Notable for the following components:   Color, Urine STRAW (*)    Glucose, UA >=500 (*)    Hgb urine dipstick LARGE (*)    Ketones, ur 80 (*)    Protein, ur 100 (*)    RBC / HPF >50 (*)    Bacteria, UA RARE (*)    All other components within normal limits  GLUCOSE, CAPILLARY - Abnormal; Notable for the following components:   Glucose-Capillary 379 (*)    All other components within normal limits  CBC - Abnormal; Notable for the following components:   WBC 19.8 (*)    Hemoglobin 11.9 (*)    Platelets 526 (*)    All other components within normal limits  GLUCOSE, CAPILLARY - Abnormal; Notable for the following  components:   Glucose-Capillary 331 (*)    All other components within normal limits  GLUCOSE, CAPILLARY - Abnormal; Notable for the following components:   Glucose-Capillary 275 (*)    All other components within normal limits  CBG MONITORING, ED - Abnormal; Notable for the following components:   Glucose-Capillary 396 (*)    All other components within normal limits  I-STAT CHEM 8, ED - Abnormal; Notable for the following components:   Potassium 5.9 (*)    Chloride 119 (*)    BUN 25 (*)    Glucose, Bld 532 (*)    TCO2 7 (*)    Hemoglobin 16.0 (*)    HCT 47.0 (*)    All other components within normal limits  CBG MONITORING, ED - Abnormal; Notable for the following components:   Glucose-Capillary 437 (*)    All other components within normal limits  CBG MONITORING, ED - Abnormal; Notable for the following components:   Glucose-Capillary 438 (*)    All other components within normal limits  PROCALCITONIN  LACTATE DEHYDROGENASE  FERRITIN  BASIC METABOLIC PANEL  BASIC METABOLIC PANEL  BETA-HYDROXYBUTYRIC ACID  HIV ANTIBODY (ROUTINE TESTING W REFLEX)  BASIC METABOLIC PANEL  BETA-HYDROXYBUTYRIC ACID  I-STAT BETA HCG BLOOD, ED ([facility], [facility], [facility] ONLY)    EKG EKG Interpretation  Date/Time:  Wednesday September 08 2020 16:25:25 EST Ventricular Rate:  120 PR Interval:    QRS Duration: 83 QT Interval:  334 QTC Calculation: 472 R Axis:   62 Text Interpretation: Sinus tachycardia Probable left atrial enlargement Low voltage, precordial leads Borderline repolarization abnormality Confirmed by Madalyn Rob [phone]) on 09/08/2020 5:21:58 PM   Radiology DG Chest Port 1 View  Result Date: 09/08/2020 CLINICAL DATA:  Shortness of breath. Insulin-dependent diabetes.  COVID-19 virus infection. EXAM: PORTABLE CHEST 1 VIEW COMPARISON:  09/01/2020 FINDINGS: The heart size and mediastinal contours are within normal limits. New patchy airspace opacity is seen in the inferior aspect of the right  upper lobe, suspicious for pneumonia. Left lung is clear. No evidence of pleural effusion. IMPRESSION: New right upper lobe airspace opacity, suspicious for pneumonia. Continued radiographic follow-up is recommended to confirm resolution. Electronically Signed   By: Marlaine Hind M.D.   On: 09/08/2020 18:18    Procedures .Critical Care Performed by: Lucrezia Starch, MD Authorized by: Lucrezia Starch, MD   Critical care provider statement:    Critical care time (minutes):  52   Critical care was time spent personally by me on the following activities:  Discussions with consultants, evaluation of patient's response to treatment, examination of patient, ordering and performing treatments and interventions, ordering and review of laboratory studies, ordering and review of radiographic studies, pulse oximetry, re-evaluation of patient's condition, obtaining history from patient or surrogate and review of old charts   (including critical care time)  Medications Ordered in ED Medications  insulin regular, human (MYXREDLIN) 100 units/ 100 mL infusion (7 Units/hr Intravenous Rate/Dose Change 09/08/20 2323)  lactated ringers infusion ( Intravenous New Bag/Given 09/08/20 1823)  dextrose 5 % in lactated ringers infusion (0 mLs Intravenous Hold 09/08/20 1922)  dextrose 50 % solution 0-50 mL (has no administration in time range)  enoxaparin (LOVENOX) injection 40 mg (40 mg Subcutaneous Given 09/08/20 2315)  levofloxacin (LEVAQUIN) IVPB 750 mg (has no administration in time range)  lactated ringers bolus 1,000 mL (0 mLs Intravenous Stopped 09/08/20 1822)  lactated ringers bolus 1,040 mL (1,040 mLs Intravenous New Bag/Given 09/08/20 1822)  lactated ringers bolus 1,040 mL (0 mLs Intravenous Duplicate 57/26/20 3559)    ED Course  I have reviewed the triage vital signs and the nursing notes.  Pertinent labs & imaging results that were available during my care of the patient were reviewed by me and  considered in my medical decision making (see chart for details).    MDM Rules/Calculators/A&P                           39 year old lady presenting to ER with concern for elevated glucose in setting of COVID-19 and noncompliance of insulin-dependent type 2 diabetes.  On initial assessment patient tachycardic, mental status is okay, very tachypneic, [place] respirations.  High suspicion for DKA, will check labs for DKA, Covid preadmission labs, initiate fluid resuscitation.  Will order insulin as soon as electrolytes are checked.  Lab work demonstrating DKA, profoundly acidotic, elevated lactic acid.  Started on insulin drip. CXR with new right upper lobe infiltrate. Either covid or bacterial pna. Started on Levaquin. Dr. [name] Sidle admitting. Will need step down for close monitoring.    Final Clinical Impression(s) / ED Diagnoses Final diagnoses:  Diabetic ketoacidosis without coma associated with type 2 diabetes mellitus (HCC)  Leukocytosis, unspecified type  COVID  Pneumonia due to infectious organism, unspecified laterality, unspecified part of lung    Rx / DC Orders ED Discharge Orders    None       Lucrezia Starch, MD 09/09/20 [phone]

## 2020-09-08 NOTE — ED Notes (Signed)
Date and time results received: 09/08/20 1812   Test: pH  Critical Value: 6.870  Name of Provider Notified: Stevie Kern MD  Orders Received? Or Actions Taken?: Acknowledged

## 2020-09-08 NOTE — H&P (Signed)
History and Physical   Shelby Brown LZJ:[id number] DOB: [birth year] DOA: 09/08/2020  Referring MD/NP/PA: Dr. Roslynn Amble  PCP: Patient, No Pcp Per   Outpatient Specialists: None  Patient coming from: Home  Chief Complaint: Nausea with vomiting  HPI: Shelby Brown is a 39 y.o. female with medical history significant of diabetes with noncompliance, severe depression, recent diagnosis of COVID-19 pneumonia and DKA who was just discharged from the hospital on December 16. Patient was supposed to continue her insulin at home. Apparently has not done so and came back tonight with significant nausea vomiting as well as breathing problems. Patient is Spanish-speaking and most history obtained through daughter. She apparently has been sick and not using her insulin the way she should. Her last insulin intake was before hospitalization on December 15. She appears to be back in DKA again and is being admitted to the hospital once again. She still has some shortness of breath but no evidence of worsening Covid pneumonia. No fever or chills no no diarrhea no hematemesis or melena..  ED Course: Temperature 95.1 blood pressure 130/80 pulse 122 respirate of 32 oxygen sat 98% on room air. Initial blood sugar is more than 500, sodium 143 potassium 5.9 chloride 119. BUN is 25 creatinine 0.70 calcium 1.2. High gap was 30. Chest x-ray showed new right upper lobe airspace opacity suspicious for pneumonia. Patient placed on insulin drip and being admitted to stepdown for further evaluation and treatment.  Review of Systems: As per HPI otherwise 10 point review of systems negative.    Past Medical History:  Diagnosis Date  . Diabetes mellitus without complication [facility])     Past Surgical History:  Procedure Laterality Date  . CESAREAN SECTION    . TUBAL LIGATION       reports that she has never smoked. She has never used smokeless tobacco. She reports that she does not drink alcohol and does not  use drugs.  No Known Allergies  Family History  Problem Relation Age of Onset  . Cancer Father   . Diabetes Sister   . Diabetes Sister      Prior to Admission medications   Medication Sig Start Date End Date Taking? Authorizing Provider  blood glucose meter kit and supplies KIT Dispense based on patient and insurance preference. Use up to four times daily as directed. (FOR ICD-9 250.00, 250.01). 09/02/20  Yes Elgergawy, Silver Huguenin, MD  Insulin Syringe-Needle U-100 31G X 15/64" 0.3 ML MISC Use twice daily to administer insulin 09/02/20  Yes Elgergawy, Silver Huguenin, MD  metFORMIN (GLUCOPHAGE) 1000 MG tablet Take 1 tablet (1,000 mg total) by mouth 2 (two) times daily with a meal. 09/02/20  Yes Elgergawy, Silver Huguenin, MD  insulin glargine (LANTUS) 100 UNIT/ML injection Inject 0.08 mLs (8 Units total) into the skin daily. Patient not taking: Reported on 09/08/2020 09/02/20   Albertine Patricia, MD    Physical Exam: Vitals:   09/08/20 1930 09/08/20 2100 09/08/20 2200 09/08/20 2300  BP:  100/64 100/62 91/63  Pulse:    (!) 104  Resp:  (!) 24 16 (!) 25  Temp: (!) 95.9 F (35.5 C) (!) 96.62 F (35.9 C) 97.88 F (36.6 C) 99.14 F (37.3 C)  TempSrc: Rectal Bladder Bladder   SpO2:    98%  Weight:      Height:          Constitutional: Acutely ill looking, non-English-speaking, depressed Vitals:   09/08/20 1930 09/08/20 2100 09/08/20 2200 09/08/20 2300  BP:  100/64  100/62 91/63  Pulse:    (!) 104  Resp:  (!) 24 16 (!) 25  Temp: (!) 95.9 F (35.5 C) (!) 96.62 F (35.9 C) 97.88 F (36.6 C) 99.14 F (37.3 C)  TempSrc: Rectal Bladder Bladder   SpO2:    98%  Weight:      Height:       Eyes: PERRL, lids and conjunctivae normal ENMT: Mucous membranes are dry. Posterior pharynx clear of any exudate or lesions.Normal dentition.  Neck: normal, supple, no masses, no thyromegaly Respiratory: clear to auscultation bilaterally, no wheezing, no crackles. Normal respiratory effort. No accessory  muscle use.  Cardiovascular: Sinus tachycardia, no murmurs / rubs / gallops. No extremity edema. 2+ pedal pulses. No carotid bruits.  Abdomen: Mildly distended and mildly tender, no masses palpated. No hepatosplenomegaly. Bowel sounds positive.  Musculoskeletal: no clubbing / cyanosis. No joint deformity upper and lower extremities. Good ROM, no contractures. Normal muscle tone.  Skin: no rashes, lesions, ulcers. No induration Neurologic: CN 2-12 grossly intact. Sensation intact, DTR normal. Strength 5/5 in all 4.  Psychiatric: Normal judgment and insight. Alert and oriented x 3. Depressed mood.     Labs on Admission: I have personally reviewed following labs and imaging studies  CBC: Recent Labs  Lab 09/02/20 0418 09/08/20 1704 09/08/20 1729  WBC 3.9* 19.1*  --   NEUTROABS 2.5 17.0*  --   HGB 10.8* 13.0 16.0*  HCT 33.3* 44.3 47.0*  MCV 91.7 100.7*  --   PLT 214 582*  --    Basic Metabolic Panel: Recent Labs  Lab 09/02/20 0058 09/02/20 0418 09/08/20 1704 09/08/20 1729 09/08/20 1736 09/08/20 2053  NA 134* 137 145 143 146* 145  K 4.1 3.6 5.6* 5.9* 5.4* 4.8  CL 106 112* 110 119* 113* 114*  CO2 16* 18* <7*  --  <7* <7*  GLUCOSE 268* 148* 527* 532* 519* 413*  BUN _0 25* 21* 19  CREATININE 0.61 0.42* 1.42* 0.70 1.15* 1.12*  CALCIUM 7.5* 7.2* 9.1  --  8.6* 8.8*  MG  --  1.7  --   --   --   --    GFR: Estimated Creatinine Clearance: 54.8 mL/min (A) (by C-G formula based on SCr of 1.12 mg/dL (H)). Liver Function Tests: Recent Labs  Lab 09/08/20 1704  AST 17  ALT 15  ALKPHOS 72  BILITOT 1.5*  PROT 8.5*  ALBUMIN 3.5   No results for input(s): LIPASE, AMYLASE in the last 168 hours. No results for input(s): AMMONIA in the last 168 hours. Coagulation Profile: No results for input(s): INR, PROTIME in the last 168 hours. Cardiac Enzymes: No results for input(s): CKTOTAL, CKMB, CKMBINDEX, TROPONINI in the last 168 hours. BNP (last 3 results) No results for  input(s): PROBNP in the last 8760 hours. HbA1C: No results for input(s): HGBA1C in the last 72 hours. CBG: Recent Labs  Lab 09/08/20 1815 09/08/20 1845 09/08/20 2043 09/08/20 2223 09/08/20 2320  GLUCAP 437* 438* 379* 331* 275*   Lipid Profile: Recent Labs    09/08/20 1704  TRIG 194*   Thyroid Function Tests: No results for input(s): TSH, T4TOTAL, FREET4, T3FREE, THYROIDAB in the last 72 hours. Anemia Panel: Recent Labs    09/08/20 1704  FERRITIN 109   Urine analysis:    Component Value Date/Time   COLORURINE STRAW (A) 09/08/2020 2024   APPEARANCEUR CLEAR 09/08/2020 2024   LABSPEC 1.018 09/08/2020 2024   PHURINE 5.0 09/08/2020 2024   GLUCOSEU >=500 (A)  09/08/2020 2024   GLUCOSEU > 1000 mg/dL (A) 12/10/2007 2144   HGBUR LARGE (A) 09/08/2020 2024   BILIRUBINUR NEGATIVE 09/08/2020 2024   BILIRUBINUR small (A) 08/06/2017 1044   KETONESUR 80 (A) 09/08/2020 2024   PROTEINUR 100 (A) 09/08/2020 2024   UROBILINOGEN 0.2 08/06/2017 1044   UROBILINOGEN 0.2 11/28/2014 1725   NITRITE NEGATIVE 09/08/2020 2024   LEUKOCYTESUR NEGATIVE 09/08/2020 2024   Sepsis Labs: _0 (procalcitonin:4,lacticidven:4) ) Recent Results (from the past 240 hour(s))  Culture, blood (routine x 2)     Status: None   Collection Time: 09/01/20  5:33 PM   Specimen: BLOOD  Result Value Ref Range Status   Specimen Description   Final    BLOOD RIGHT ANTECUBITAL Performed at [facility], [place] [address], [place], [place] [address]    Special Requests   Final    BOTTLES DRAWN AEROBIC AND ANAEROBIC Blood Culture adequate volume Performed at [facility] [address] [place], [place] [address]    Culture   Final    NO GROWTH 5 DAYS Performed at [facility], [place] [address], [place], [place] [address]    Report Status 09/06/2020 FINAL  Final  Resp Panel by RT-PCR (Flu A&B, Covid) Nasopharyngeal Swab     Status: Abnormal   Collection Time: 09/01/20   6:00 PM   Specimen: Nasopharyngeal Swab; Nasopharyngeal(NP) swabs in vial transport medium  Result Value Ref Range Status   SARS Coronavirus 2 by RT PCR POSITIVE (A) NEGATIVE Final    Comment: RESULT CALLED TO, READ BACK BY AND VERIFIED WITH: SMITH,J. RN _1  09/01/20 BILLINGSLEY,L (NOTE) SARS-CoV-2 target nucleic acids are DETECTED.  The SARS-CoV-2 RNA is generally detectable in upper respiratory specimens during the acute phase of infection. Positive results are indicative of the presence of the identified virus, but do not rule out bacterial infection or co-infection with other pathogens not detected by the test. Clinical correlation with patient history and other diagnostic information is necessary to determine patient infection status. The expected result is Negative.  Fact Sheet for Patients: EntrepreneurPulse.com.au  Fact Sheet for Healthcare Providers: IncredibleEmployment.be  This test is not yet approved or cleared by the [place] FDA and  has been authorized for detection and/or diagnosis of SARS-CoV-2 by FDA under an Emergency Use Authorization (EUA).  This EUA will remain in effect (meaning this test  can be used) for the duration of  the COVID-19 declaration under Section 564(b)(1) of the Act, 21 U.S.C. section 360bbb-3(b)(1), unless the authorization is terminated or revoked sooner.     Influenza A by PCR NEGATIVE NEGATIVE Final   Influenza B by PCR NEGATIVE NEGATIVE Final    Comment: (NOTE) The Xpert Xpress SARS-CoV-2/FLU/RSV plus assay is intended as an aid in the diagnosis of influenza from Nasopharyngeal swab specimens and should not be used as a sole basis for treatment. Nasal washings and aspirates are unacceptable for Xpert Xpress SARS-CoV-2/FLU/RSV testing.  Fact Sheet for Patients: EntrepreneurPulse.com.au  Fact Sheet for Healthcare  Providers: IncredibleEmployment.be  This test is not yet approved or cleared by the [place] FDA and has been authorized for detection and/or diagnosis of SARS-CoV-2 by FDA under an Emergency Use Authorization (EUA). This EUA will remain in effect (meaning this test can be used) for the duration of the COVID-19 declaration under Section 564(b)(1) of the Act, 21 U.S.C. section 360bbb-3(b)(1), unless the authorization is terminated or revoked.  Performed at [facility], [address] [place], [place]  [place] on Admission: DG Chest Port 1 View  Result Date: 09/08/2020 CLINICAL DATA:  Shortness of breath. Insulin-dependent diabetes. COVID-19 virus infection. EXAM: PORTABLE CHEST 1 VIEW COMPARISON:  09/01/2020 FINDINGS: The heart size and mediastinal contours are within normal limits. New patchy airspace opacity is seen in the inferior aspect of the right upper lobe, suspicious for pneumonia. Left lung is clear. No evidence of pleural effusion. IMPRESSION: New right upper lobe airspace opacity, suspicious for pneumonia. Continued radiographic follow-up is recommended to confirm resolution. Electronically Signed   By: Marlaine Hind M.D.   On: 09/08/2020 18:18    EKG: Independently reviewed. Sinus tachycardia  Assessment/Plan Principal Problem:   Diabetic ketoacidosis associated with type 2 diabetes mellitus (HCC) Active Problems:   MDD (major depressive disorder)   COVID-19 virus infection   DKA, type 2 ([place])   Lobar pneumonia ([place])     #1 diabetic ketoacidosis: Patient will be admitted and initiated on IV insulin drip with DKA protocol. We will continue monitoring high gap hydration as well as electrolyte management.  #2 COVID-19 infection: Patient has had asymptomatic Covid. Now has lobar pneumonia. Appears to be may be secondary bacterial pneumonia. Will observe patient. No active treatment for Covid would be  initiated at this point.  #3 lobar pneumonia: Probably secondary bacterial pneumonia. Initiate Levaquin. Patient meets sepsis criteria.  #4 sepsis due to pneumonia: Most likely patient is septic from pneumonia. She is early. We will continue with fluid resuscitation.  #5 depression: We'll resume home regimen as soon as practicable   DVT prophylaxis: Lovenox Code Status: Full code Family Communication: Daughter Disposition Plan: Home Consults called: None Admission status: Inpatient  Severity of Illness: The appropriate patient status for this patient is INPATIENT. Inpatient status is judged to be reasonable and necessary in order to provide the required intensity of service to ensure the patient's safety. The patient's presenting symptoms, physical exam findings, and initial radiographic and laboratory data in the context of their chronic comorbidities is felt to place them at high risk for further clinical deterioration. Furthermore, it is not anticipated that the patient will be medically stable for discharge from the hospital within 2 midnights of admission. The following factors support the patient status of inpatient.   " The patient's presenting symptoms include nausea vomiting no weakness. " The worrisome physical exam findings include dry mucous membranes. " The initial radiographic and laboratory data are worrisome because of evidence of DKA. " The chronic co-morbidities include diabetes.   * I certify that at the point of admission it is my clinical judgment that the patient will require inpatient hospital care spanning beyond 2 midnights from the point of admission due to high intensity of service, high risk for further deterioration and high frequency of surveillance required.Barbette Merino MD [facility] Pager [phone]   If 7PM-7AM, please contact night-coverage [email or web address] Password [facility]  09/08/2020, 11:35 PM

## 2020-09-08 NOTE — Plan of Care (Signed)
  Problem: Respiratory: Goal: Will maintain a patent airway Outcome: Progressing   Problem: Metabolic: Goal: Ability to maintain appropriate glucose levels will improve Outcome: Progressing

## 2020-09-08 NOTE — ED Notes (Signed)
Date and time results received: 09/08/20  1754   Test: Glucose 527           Lactic Acid 3.5   Name of Provider Notified:D[place]stra  Orders Received? Or Actions Taken?:

## 2020-09-08 NOTE — Progress Notes (Signed)
[facility] Nurse reports patient with inability to urinate and over 900 cc urine in bladder per bladder scan. Patient reported to RN she has been having trouble at home for several days with bladder emptying and she is only urinating in very samll amounts.  Given patient with sepsis criteria (elevated lactic, hypothermia, leukocytosis, tachycardia and tachypnea)  and covid infection, urinalysis and foley ordered

## 2020-09-08 NOTE — ED Notes (Signed)
Dr. Mikeal Hawthorne aware pts temp is 95.1, bare hugger warmer in place on patient.

## 2020-09-09 ENCOUNTER — Inpatient Hospital Stay (HOSPITAL_COMMUNITY): Payer: HRSA Program

## 2020-09-09 ENCOUNTER — Inpatient Hospital Stay (HOSPITAL_COMMUNITY): Payer: Self-pay

## 2020-09-09 DIAGNOSIS — F332 Major depressive disorder, recurrent severe without psychotic features: Secondary | ICD-10-CM | POA: Diagnosis not present

## 2020-09-09 DIAGNOSIS — J181 Lobar pneumonia, unspecified organism: Secondary | ICD-10-CM | POA: Diagnosis not present

## 2020-09-09 DIAGNOSIS — U071 COVID-19: Secondary | ICD-10-CM | POA: Diagnosis not present

## 2020-09-09 DIAGNOSIS — R7989 Other specified abnormal findings of blood chemistry: Secondary | ICD-10-CM | POA: Diagnosis not present

## 2020-09-09 DIAGNOSIS — E1110 Type 2 diabetes mellitus with ketoacidosis without coma: Secondary | ICD-10-CM | POA: Diagnosis not present

## 2020-09-09 LAB — GLUCOSE, CAPILLARY
Glucose-Capillary: 107 mg/dL — ABNORMAL HIGH (ref 70–99)
Glucose-Capillary: 130 mg/dL — ABNORMAL HIGH (ref 70–99)
Glucose-Capillary: 134 mg/dL — ABNORMAL HIGH (ref 70–99)
Glucose-Capillary: 138 mg/dL — ABNORMAL HIGH (ref 70–99)
Glucose-Capillary: 141 mg/dL — ABNORMAL HIGH (ref 70–99)
Glucose-Capillary: 149 mg/dL — ABNORMAL HIGH (ref 70–99)
Glucose-Capillary: 151 mg/dL — ABNORMAL HIGH (ref 70–99)
Glucose-Capillary: 158 mg/dL — ABNORMAL HIGH (ref 70–99)
Glucose-Capillary: 160 mg/dL — ABNORMAL HIGH (ref 70–99)
Glucose-Capillary: 166 mg/dL — ABNORMAL HIGH (ref 70–99)
Glucose-Capillary: 170 mg/dL — ABNORMAL HIGH (ref 70–99)
Glucose-Capillary: 188 mg/dL — ABNORMAL HIGH (ref 70–99)
Glucose-Capillary: 190 mg/dL — ABNORMAL HIGH (ref 70–99)
Glucose-Capillary: 198 mg/dL — ABNORMAL HIGH (ref 70–99)
Glucose-Capillary: 232 mg/dL — ABNORMAL HIGH (ref 70–99)
Glucose-Capillary: 243 mg/dL — ABNORMAL HIGH (ref 70–99)
Glucose-Capillary: 243 mg/dL — ABNORMAL HIGH (ref 70–99)
Glucose-Capillary: 245 mg/dL — ABNORMAL HIGH (ref 70–99)
Glucose-Capillary: 258 mg/dL — ABNORMAL HIGH (ref 70–99)
Glucose-Capillary: 275 mg/dL — ABNORMAL HIGH (ref 70–99)

## 2020-09-09 LAB — BASIC METABOLIC PANEL
Anion gap: 14 (ref 5–15)
Anion gap: 9 (ref 5–15)
Anion gap: 9 (ref 5–15)
BUN: 17 mg/dL (ref 6–20)
BUN: 15 mg/dL (ref 6–20)
BUN: 13 mg/dL (ref 6–20)
CO2: 12 mmol/L — ABNORMAL LOW (ref 22–32)
CO2: 21 mmol/L — ABNORMAL LOW (ref 22–32)
CO2: 21 mmol/L — ABNORMAL LOW (ref 22–32)
Calcium: 8.4 mg/dL — ABNORMAL LOW (ref 8.9–10.3)
Calcium: 8.9 mg/dL (ref 8.9–10.3)
Calcium: 8.6 mg/dL — ABNORMAL LOW (ref 8.9–10.3)
Chloride: 120 mmol/L — ABNORMAL HIGH (ref 98–111)
Chloride: 121 mmol/L — ABNORMAL HIGH (ref 98–111)
Chloride: 120 mmol/L — ABNORMAL HIGH (ref 98–111)
Creatinine, Ser: 0.84 mg/dL (ref 0.44–1.00)
Creatinine, Ser: 0.62 mg/dL (ref 0.44–1.00)
Creatinine, Ser: 0.6 mg/dL (ref 0.44–1.00)
GFR, Estimated: >60 mL/min (ref >60)
GFR, Estimated: >60 mL/min (ref >60)
GFR, Estimated: >60 mL/min (ref >60)
Glucose, Bld: 279 mg/dL — ABNORMAL HIGH (ref 70–99)
Glucose, Bld: 172 mg/dL — ABNORMAL HIGH (ref 70–99)
Glucose, Bld: 168 mg/dL — ABNORMAL HIGH (ref 70–99)
Potassium: 4.4 mmol/L (ref 3.5–5.1)
Potassium: 2.9 mmol/L — ABNORMAL LOW (ref 3.5–5.1)
Potassium: 3.2 mmol/L — ABNORMAL LOW (ref 3.5–5.1)
Sodium: 146 mmol/L — ABNORMAL HIGH (ref 135–145)
Sodium: 151 mmol/L — ABNORMAL HIGH (ref 135–145)
Sodium: 150 mmol/L — ABNORMAL HIGH (ref 135–145)

## 2020-09-09 LAB — CBC
HCT: 36.8 % (ref 36.0–46.0)
Hemoglobin: 12 g/dL (ref 12.0–15.0)
MCH: 29.7 pg (ref 26.0–34.0)
MCHC: 32.6 g/dL (ref 30.0–36.0)
MCV: 91.1 fL (ref 80.0–100.0)
Platelets: 529 10*3/uL — ABNORMAL HIGH (ref 150–400)
RBC: 4.04 MIL/uL (ref 3.87–5.11)
RDW: 12.1 % (ref 11.5–15.5)
WBC: 15.8 10*3/uL — ABNORMAL HIGH (ref 4.0–10.5)
nRBC: 0 % (ref 0.0–0.2)

## 2020-09-09 LAB — HIV ANTIBODY (ROUTINE TESTING W REFLEX): HIV Screen 4th Generation wRfx: NONREACTIVE

## 2020-09-09 LAB — BETA-HYDROXYBUTYRIC ACID
Beta-Hydroxybutyric Acid: 3.75 mmol/L — ABNORMAL HIGH (ref 0.05–0.27)
Beta-Hydroxybutyric Acid: 0.61 mmol/L — ABNORMAL HIGH (ref 0.05–0.27)
Beta-Hydroxybutyric Acid: 0.5 mmol/L — ABNORMAL HIGH (ref 0.05–0.27)
Beta-Hydroxybutyric Acid: 0.39 mmol/L — ABNORMAL HIGH (ref 0.05–0.27)

## 2020-09-09 LAB — GASTRIC OCCULT BLOOD (1-CARD TO LAB)
Occult Blood, Gastric: POSITIVE — AB
pH, Gastric: 1

## 2020-09-09 MED ORDER — LIP MEDEX EX OINT
1.0000 "application " | TOPICAL_OINTMENT | CUTANEOUS | Status: DC | PRN
  Administered 2020-09-09: 1 via TOPICAL
  Filled 2020-09-09: qty 7

## 2020-09-09 MED ORDER — PANTOPRAZOLE SODIUM 40 MG IV SOLR
40.0000 mg | Freq: Two times a day (BID) | INTRAVENOUS | Status: DC
  Administered 2020-09-13 (×2): 40 mg via INTRAVENOUS
  Filled 2020-09-09 (×2): qty 40

## 2020-09-09 MED ORDER — ONDANSETRON HCL 4 MG/2ML IJ SOLN
4.0000 mg | Freq: Four times a day (QID) | INTRAMUSCULAR | Status: DC | PRN
  Administered 2020-09-09 (×3): 4 mg via INTRAVENOUS
  Filled 2020-09-09 (×3): qty 2

## 2020-09-09 MED ORDER — SODIUM CHLORIDE 0.9 % IV SOLN
8.0000 mg/h | INTRAVENOUS | Status: AC
  Administered 2020-09-09 – 2020-09-12 (×8): 8 mg/h via INTRAVENOUS
  Filled 2020-09-09 (×10): qty 80

## 2020-09-09 MED ORDER — CHLORHEXIDINE GLUCONATE CLOTH 2 % EX PADS
6.0000 | MEDICATED_PAD | Freq: Every day | CUTANEOUS | Status: DC
  Administered 2020-09-10 – 2020-09-13 (×4): 6 via TOPICAL

## 2020-09-09 MED ORDER — PROMETHAZINE HCL 25 MG/ML IJ SOLN
12.5000 mg | Freq: Once | INTRAMUSCULAR | Status: AC
  Administered 2020-09-09: 06:00:00 12.5 mg via INTRAVENOUS
  Filled 2020-09-09: qty 1

## 2020-09-09 MED ORDER — LEVOFLOXACIN IN D5W 750 MG/150ML IV SOLN
750.0000 mg | INTRAVENOUS | Status: DC
  Administered 2020-09-09 – 2020-09-10 (×2): 750 mg via INTRAVENOUS
  Filled 2020-09-09 (×2): qty 150

## 2020-09-09 MED ORDER — IOHEXOL 350 MG/ML SOLN
100.0000 mL | Freq: Once | INTRAVENOUS | Status: AC | PRN
  Administered 2020-09-09: 17:00:00 80 mL via INTRAVENOUS

## 2020-09-09 MED ORDER — KCL IN DEXTROSE-NACL 40-5-0.45 MEQ/L-%-% IV SOLN
INTRAVENOUS | Status: DC
  Filled 2020-09-09 (×3): qty 1000

## 2020-09-09 MED ORDER — HYDROMORPHONE HCL 1 MG/ML IJ SOLN
0.5000 mg | Freq: Once | INTRAMUSCULAR | Status: AC
  Administered 2020-09-09: 21:00:00 0.5 mg via INTRAVENOUS
  Filled 2020-09-09: qty 1

## 2020-09-09 MED ORDER — PHENOL 1.4 % MT LIQD
1.0000 | OROMUCOSAL | Status: DC | PRN
  Administered 2020-09-09: 21:00:00 1 via OROMUCOSAL
  Filled 2020-09-09: qty 177

## 2020-09-09 MED ORDER — ORAL CARE MOUTH RINSE
15.0000 mL | Freq: Two times a day (BID) | OROMUCOSAL | Status: DC
  Administered 2020-09-09 – 2020-09-13 (×7): 15 mL via OROMUCOSAL

## 2020-09-09 MED ORDER — SODIUM CHLORIDE 0.9 % IV SOLN
80.0000 mg | Freq: Once | INTRAVENOUS | Status: AC
  Administered 2020-09-09: 18:00:00 80 mg via INTRAVENOUS
  Filled 2020-09-09: qty 80

## 2020-09-09 MED ORDER — ENOXAPARIN SODIUM 60 MG/0.6ML ~~LOC~~ SOLN
50.0000 mg | Freq: Two times a day (BID) | SUBCUTANEOUS | Status: DC
  Administered 2020-09-09: 11:00:00 50 mg via SUBCUTANEOUS
  Filled 2020-09-09: qty 0.5

## 2020-09-09 MED ORDER — LIVING WELL WITH DIABETES BOOK - IN SPANISH
Freq: Once | Status: AC
  Filled 2020-09-09: qty 1

## 2020-09-09 MED ORDER — INSULIN STARTER KIT- PEN NEEDLES (SPANISH)
1.0000 | Freq: Once | Status: AC
  Administered 2020-09-10: 16:00:00 1
  Filled 2020-09-09: qty 1

## 2020-09-09 NOTE — Progress Notes (Signed)
Bilateral lower extremity venous duplex has been completed. Preliminary results can be found in CV Proc through chart review.   09/09/20 11:31 AM Olen Cordial RVT

## 2020-09-09 NOTE — Progress Notes (Addendum)
PROGRESS NOTE  Shelby Brown  EVO:[id number] DOB: 1981-01-08 DOA: 09/08/2020 PCP: Patient, No Pcp Per   Brief Narrative: [name] is a 39 y.o. female with a history of poorly controlled IDDM complicated by nonadherence, MDD, and recent admission for DKA with covid-19 infection treated with monoclonal antibody 12/15 prior to discharge on 12/16 who was brought back to the ED 12/22 due to dyspnea, nausea and vomiting in the setting of not taking insulin at home. She was hypothermic, tachycardic, tachypneic without hypoxia, found to be in DKA with venous pH 6.87, bicarbonate <7, anion gap 30. CXR demonstrated new RUL airspace opacity thought to represent superimposed bacterial pneumonia. CRP 3.6, PCT negative. D-dimer >20 and LE U/S ordered.    Assessment & Plan: Principal Problem:   Diabetic ketoacidosis associated with type 2 diabetes mellitus (HCC) Active Problems:   MDD (major depressive disorder)   COVID-19 virus infection   DKA, type 2 (HCC)   Lobar pneumonia (HCC)  DKA with poorly-controlled IDT2DM: HbA1c 12.7%. ?If atypical diabetes e.g. MODY.  - Continue insulin gtt and serial metabolic monitoring. Still acidotic with elevated ketones.  - Continue IVF as ordered.  - Appointment to establish care at [facility] on 09/23/2019 was scheduled prior to recent discharge. Anticipate requiring insulin at discharge.  Addendum: Pt now having hemoccult positive emesis with pink streaks. Hgb trending stable. In setting of previous vomiting, now with some hematemesis, strong suspicion for MW tear, gastritis also possible. No hx PUD.  - Avoid NSAIDs - Protonix gtt - Discussed with GI, Dr. Levora Angel, who appropriately recommends against endoscopy at this time.  - Despite benign exam, this does make a CT abd more likely to be helpful. - DC anticoagulation for now. CTA chest also ordered.  Hyperkalemia: Resolved.   Hypernatremia: Likely underestimated based on  concomitant hyperglycemia.  - Continue isotonic IVF for now as she remains volume down, having good UOP.  Acute urinary retention: Possibly related to autonomic neuropathy.  - Foley catheter to remain in place for now  Covid-19 infection: Dx 12/15, s/p bamlanivimab/etesevimab 12/15.  - No longer likely to benefit from remdesivir and certainly will avoid steroids. Admissions have been due to DKA, not necessarily/directly covid-19. With new infiltrate, it remains possible that symptoms could worsen from here (admitted 1 week after Dx). Remain on isolation.   Sepsis due to superimposed bacterial RUL pneumonia: Working diagnosis at this time.  - Continue levaquin, though would stop if PCT remains negative.  Elevated d-dimer:  - No unilateral swelling of legs and very mild hypoxia would not be consistent with PE. Initiate work up with venous U/S.  - Based on severity of elevation, will initiate therapeutic lovenox dosing.   MDD:  - Resume home medications once taking po.  DVT prophylaxis: Lovenox 1mg /kg q12h Code Status: Full Family Communication: Daughter by phone today. Disposition Plan:  Status is: Inpatient  Remains inpatient appropriate because:Persistent severe electrolyte disturbances, Altered mental status, IV treatments appropriate due to intensity of illness or inability to take PO and Inpatient level of care appropriate due to severity of illness  Dispo: The patient is from: Home              Anticipated d/c is to: Home              Anticipated d/c date is: > 3 days              Patient currently is not medically stable to d/c.  Consultants:   None  Procedures:   None  Antimicrobials:  Levaquin 12/22 >>   Subjective: Spanish video interpretor Vicente Serene #[id number] used throughout encounter. Patient reports feeling even worse in a general way from admission, still having emesis, requiring foley catheterization overnight with significant UOP. Confirms presence of pain and  rubs abdomen.   Objective: Vitals:   09/09/20 0300 09/09/20 0400 09/09/20 0500 09/09/20 0600  BP: 126/74 140/73 138/71 122/66  Pulse:  96 93 89  Resp: (!) 21 17 19 18   Temp: 99.14 F (37.3 C) 99.14 F (37.3 C) 99.14 F (37.3 C) 99.14 F (37.3 C)  TempSrc:      SpO2:  100% 100% 99%  Weight:      Height:        Intake/Output Summary (Last 24 hours) at 09/09/2020 09/11/2020 Last data filed at 09/09/2020 09/11/2020 Gross per 24 hour  Intake 2275.59 ml  Output 2200 ml  Net 75.59 ml   Filed Weights   09/08/20 1620 09/08/20 2300  Weight: 52 kg 45 kg    Gen: Tired appearing thin female in mild distress  Pulm: Non-labored breathing. Clear to auscultation bilaterally.  CV: Regular tachycardia. No murmur, rub, or gallop. No JVD, no pedal edema. GI: Abdomen soft, benign, non-tender, non-distended, with normoactive bowel sounds. No organomegaly or masses felt. Ext: Warm, no deformities Skin: No rashes, lesions or ulcers on visualized skin Neuro: Drowsy but rousable. Moves all extremities. Psych: UTD  Data Reviewed: I have personally reviewed following labs and imaging studies  CBC: Recent Labs  Lab 09/08/20 1704 09/08/20 1729 09/08/20 2237  WBC 19.1*  --  19.8*  NEUTROABS 17.0*  --   --   HGB 13.0 16.0* 11.9*  HCT 44.3 47.0* 37.7  MCV 100.7*  --  93.8  PLT 582*  --  526*   Basic Metabolic Panel: Recent Labs  Lab 09/08/20 1704 09/08/20 1729 09/08/20 1736 09/08/20 2053 09/09/20 0312  NA 145 143 146* 145 146*  K 5.6* 5.9* 5.4* 4.8 4.4  CL 110 119* 113* 114* 120*  CO2 <7*  --  <7* <7* 12*  GLUCOSE 527* 532* 519* 413* 279*  BUN 18 25* 21* 19 17  CREATININE 1.42* 0.70 1.15* 1.12* 0.84  CALCIUM 9.1  --  8.6* 8.8* 8.4*   GFR: Estimated Creatinine Clearance: 63.2 mL/min (by C-G formula based on SCr of 0.84 mg/dL). Liver Function Tests: Recent Labs  Lab 09/08/20 1704  AST 17  ALT 15  ALKPHOS 72  BILITOT 1.5*  PROT 8.5*  ALBUMIN 3.5   No results for input(s): LIPASE,  AMYLASE in the last 168 hours. No results for input(s): AMMONIA in the last 168 hours. Coagulation Profile: No results for input(s): INR, PROTIME in the last 168 hours. Cardiac Enzymes: No results for input(s): CKTOTAL, CKMB, CKMBINDEX, TROPONINI in the last 168 hours. BNP (last 3 results) No results for input(s): PROBNP in the last 8760 hours. HbA1C: No results for input(s): HGBA1C in the last 72 hours. CBG: Recent Labs  Lab 09/09/20 0331 09/09/20 0445 09/09/20 0552 09/09/20 0650 09/09/20 0819  GLUCAP 232* 258* 245* 243* 198*   Lipid Profile: Recent Labs    09/08/20 1704  TRIG 194*   Thyroid Function Tests: No results for input(s): TSH, T4TOTAL, FREET4, T3FREE, THYROIDAB in the last 72 hours. Anemia Panel: Recent Labs    09/08/20 1704  FERRITIN 109   Urine analysis:    Component Value Date/Time   COLORURINE STRAW (A) 09/08/2020 2024   APPEARANCEUR CLEAR 09/08/2020 2024  LABSPEC 1.018 09/08/2020 2024   PHURINE 5.0 09/08/2020 2024   GLUCOSEU >=500 (A) 09/08/2020 2024   GLUCOSEU > 1000 mg/dL (A) 91/47/829503/24/2009 [id number]   HGBUR LARGE (A) 09/08/2020 2024   BILIRUBINUR NEGATIVE 09/08/2020 2024   BILIRUBINUR small (A) 08/06/2017 1044   KETONESUR 80 (A) 09/08/2020 2024   PROTEINUR 100 (A) 09/08/2020 2024   UROBILINOGEN 0.2 08/06/2017 1044   UROBILINOGEN 0.2 11/28/2014 1725   NITRITE NEGATIVE 09/08/2020 2024   LEUKOCYTESUR NEGATIVE 09/08/2020 2024   Recent Results (from the past 240 hour(s))  Culture, blood (routine x 2)     Status: None   Collection Time: 09/01/20  5:33 PM   Specimen: BLOOD  Result Value Ref Range Status   Specimen Description   Final    BLOOD RIGHT ANTECUBITAL Performed at [facility], [address] [place], [place] [address]    Special Requests   Final    BOTTLES DRAWN AEROBIC AND ANAEROBIC Blood Culture adequate volume Performed at [facility], [address] [place], [place] [address]    Culture    Final    NO GROWTH 5 DAYS Performed at [facility], [address] [place], [place] [address]    Report Status 09/06/2020 FINAL  Final  Resp Panel by RT-PCR (Flu A&B, Covid) Nasopharyngeal Swab     Status: Abnormal   Collection Time: 09/01/20  6:00 PM   Specimen: Nasopharyngeal Swab; Nasopharyngeal(NP) swabs in vial transport medium  Result Value Ref Range Status   SARS Coronavirus 2 by RT PCR POSITIVE (A) NEGATIVE Final    Comment: RESULT CALLED TO, READ BACK BY AND VERIFIED WITH: SMITH,J. RN @2028  09/01/20 BILLINGSLEY,L (NOTE) SARS-CoV-2 target nucleic acids are DETECTED.  The SARS-CoV-2 RNA is generally detectable in upper respiratory specimens during the acute phase of infection. Positive results are indicative of the presence of the identified virus, but do not rule out bacterial infection or co-infection with other pathogens not detected by the test. Clinical correlation with patient history and other diagnostic information is necessary to determine patient infection status. The expected result is Negative.  Fact Sheet for Patients: BloggerCourse.comhttps://www.fda.gov/media/152166/download  Fact Sheet for Healthcare Providers: SeriousBroker.ithttps://www.fda.gov/media/152162/download  This test is not yet approved or cleared by the [place] FDA and  has been authorized for detection and/or diagnosis of SARS-CoV-2 by FDA under an Emergency Use Authorization (EUA).  This EUA will remain in effect (meaning this test  can be used) for the duration of  the COVID-19 declaration under Section 564(b)(1) of the Act, 21 U.S.C. section 360bbb-3(b)(1), unless the authorization is terminated or revoked sooner.     Influenza A by PCR NEGATIVE NEGATIVE Final   Influenza B by PCR NEGATIVE NEGATIVE Final    Comment: (NOTE) The Xpert Xpress SARS-CoV-2/FLU/RSV plus assay is intended as an aid in the diagnosis of influenza from Nasopharyngeal swab specimens and should not be used as a sole basis for  treatment. Nasal washings and aspirates are unacceptable for Xpert Xpress SARS-CoV-2/FLU/RSV testing.  Fact Sheet for Patients: BloggerCourse.comhttps://www.fda.gov/media/152166/download  Fact Sheet for Healthcare Providers: SeriousBroker.ithttps://www.fda.gov/media/152162/download  This test is not yet approved or cleared by the [place] FDA and has been authorized for detection and/or diagnosis of SARS-CoV-2 by FDA under an Emergency Use Authorization (EUA). This EUA will remain in effect (meaning this test can be used) for the duration of the COVID-19 declaration under Section 564(b)(1) of the Act, 21 U.S.C. section 360bbb-3(b)(1), unless the authorization is terminated or revoked.  Performed  at [facility], [address] [place], [place] [address]       Radiology Studies: [facility] Chest Port 1 View  Result Date: 09/08/2020 CLINICAL DATA:  Shortness of breath. Insulin-dependent diabetes. COVID-19 virus infection. EXAM: PORTABLE CHEST 1 VIEW COMPARISON:  09/01/2020 FINDINGS: The heart size and mediastinal contours are within normal limits. New patchy airspace opacity is seen in the inferior aspect of the right upper lobe, suspicious for pneumonia. Left lung is clear. No evidence of pleural effusion. IMPRESSION: New right upper lobe airspace opacity, suspicious for pneumonia. Continued radiographic follow-up is recommended to confirm resolution. Electronically Signed   By: Danae Orleans M.D.   On: 09/08/2020 18:18    Scheduled Meds:  Chlorhexidine Gluconate Cloth  6 each Topical Daily   enoxaparin (LOVENOX) injection  1 mg/kg Subcutaneous Q12H   Continuous Infusions:  dextrose 5% lactated ringers 125 mL/hr at 09/09/20 0220   insulin 12 Units/hr (09/09/20 0651)   lactated ringers Stopped (09/09/20 0039)   levofloxacin (LEVAQUIN) IV Stopped (09/09/20 0224)     LOS: 1 day   Time spent: 35 minutes.  Tyrone Nine, MD [facility] [email or web address] 09/09/2020, 8:22 AM

## 2020-09-09 NOTE — Progress Notes (Addendum)
Inpatient Diabetes Program Recommendations  AACE/ADA: New Consensus Statement on Inpatient Glycemic Control (2015)  Target Ranges:  Prepandial:   less than 140 mg/dL      Peak postprandial:   less than 180 mg/dL (1-2 hours)      Critically ill patients:  140 - 180 mg/dL   Lab Results  Component Value Date   GLUCAP 190 (H) 09/09/2020   HGBA1C 12.7 (H) 09/01/2020    Review of Glycemic Control Results for LEXIANA, SPINDEL (MRN [id number]) as of 09/09/2020 09:27  Ref. Range 09/09/2020 03:31 09/09/2020 04:45 09/09/2020 05:52 09/09/2020 06:50 09/09/2020 08:19 09/09/2020 09:13  Glucose-Capillary Latest Ref Range: 70 - 99 mg/dL 232 (H) 258 (H) 245 (H) 243 (H) 198 (H) 190 (H)  Results for ANDRIEA, HASEGAWA (MRN [id number]) as of 09/09/2020 09:27  Ref. Range 09/09/2020 03:12  Sodium Latest Ref Range: 135 - 145 mmol/L 146 (H)  Potassium Latest Ref Range: 3.5 - 5.1 mmol/L 4.4  Chloride Latest Ref Range: 98 - 111 mmol/L 120 (H)  CO2 Latest Ref Range: 22 - 32 mmol/L 12 (L)  Glucose Latest Ref Range: 70 - 99 mg/dL 279 (H)  BUN Latest Ref Range: 6 - 20 mg/dL 17  Creatinine Latest Ref Range: 0.44 - 1.00 mg/dL 0.84  Calcium Latest Ref Range: 8.9 - 10.3 mg/dL 8.4 (L)  Anion gap Latest Ref Range: 5 - 15  14  Results for JEVAEH, SHAMS (MRN [id number]) as of 09/09/2020 09:27  Ref. Range 09/09/2020 03:12  Beta-Hydroxybutyric Acid Latest Ref Range: 0.05 - 0.27 mmol/L 3.75 (H)   Diabetes history: DM  Outpatient Diabetes medications:  Metformin 1000 mg bid, Lantus 8 units daily Novolog 0-15 units tid with meals Current orders for Inpatient glycemic control:  IV insulin- DKA order set  Inpatient Diabetes Program Recommendations:    Note that patient did not take insulin after being d/c'd on 09/02/20.  Note patient has no insurance.  Currently still acidotic.    Once acidosis is cleared, consider Lantus 8 units daily 2 hours prior to d/c of IV insulin,  Novolog very sensitive tid  with meals and HS, and Novolog 2 units tid with meals (hold if patient eats less than 50% or NPO).    Will attempt to call patient using interpreter today.    Thanks  Adah Perl, RN, BC-ADM Inpatient Diabetes Coordinator Pager [phone] (8a-5p)   Addendum [phone]- Called and spoke with daughter regarding home insulin that was ordered on 09/02/20.  Per daughter, patient's husband picked up medications but patient did not want to take the insulin b/c she "preferred the pills".  Patient was on insulin during pregnancy but had not been on insulin recently.  Explained to daughter that patient now needs insulin at home and that pills are not enough.  RN states patient not feeling well today.  Will need to reinforce with patient the importance of taking insulin at d/c, monitoring, etc.  Will attempt to reach out to patient on 09/10/20 to discuss importance of insulin at d/c using interpreter.  Discussed with RN Caryl Pina and ordered insulin starter kit as well as insulin booklet in [place].

## 2020-09-09 NOTE — Progress Notes (Signed)
Initial Nutrition Assessment  DOCUMENTATION CODES:   Underweight  INTERVENTION:   Once diet advanced: -Glucerna Shake po TID, each supplement provides 220 kcal and 10 grams of protein -Multivitamin with minerals daily  NUTRITION DIAGNOSIS:   Inadequate oral intake related to acute illness,poor appetite as evidenced by per patient/family report.  GOAL:   Patient will meet greater than or equal to 90% of their needs  MONITOR:   PO intake,Supplement acceptance,Labs,Weight trends,I & O's  REASON FOR ASSESSMENT:   Malnutrition Screening Tool    ASSESSMENT:   39 y.o. female with a history of poorly controlled IDDM complicated by nonadherence, MDD, and recent admission for DKA with covid-19 infection treated with monoclonal antibody 12/15 prior to discharge on 12/16 who was brought back to the ED 12/22 due to dyspnea, nausea and vomiting in the setting of not taking insulin at home.  Patient has been COVID-19+ since 12/15. Pt had a previous admission when first diagnosed. At that time pt was having poor appetite d/t symptoms and diarrhea.  Appetite has continued to be poor. Having N/V today.  Per chart review, pt was not taking insulin at home. Pt has been followed by outpatient RD/CDE in the past.   Recommend protein supplements once diet is advanced and nausea under control.   Admission weight: 99 lbs. No reliable weights recently in records. Will monitor weight trends.  Medications: D5 infusion, IV Zofran  Labs reviewed:  CBGs: 134-160 Elevated Na Low K  NUTRITION - FOCUSED PHYSICAL EXAM:  Deferred.  Diet Order:   Diet Order            Diet NPO time specified  Diet effective now                 EDUCATION NEEDS:   No education needs have been identified at this time  Skin:  Skin Assessment: Reviewed RN Assessment  Last BM:  PTA  Height:   Ht Readings from Last 1 Encounters:  09/08/20 5\' 3"  (1.6 m)    Weight:   Wt Readings from Last 1  Encounters:  09/08/20 45 kg   BMI:  Body mass index is 17.57 kg/m.  Estimated Nutritional Needs:   Kcal:  1350-1550  Protein:  70-85g  Fluid:  1.6L/day  09/10/20, MS, RD, LDN Inpatient Clinical Dietitian Contact information available via Amion

## 2020-09-10 DIAGNOSIS — F332 Major depressive disorder, recurrent severe without psychotic features: Secondary | ICD-10-CM | POA: Diagnosis not present

## 2020-09-10 DIAGNOSIS — E1110 Type 2 diabetes mellitus with ketoacidosis without coma: Secondary | ICD-10-CM | POA: Diagnosis not present

## 2020-09-10 DIAGNOSIS — J181 Lobar pneumonia, unspecified organism: Secondary | ICD-10-CM | POA: Diagnosis not present

## 2020-09-10 DIAGNOSIS — U071 COVID-19: Secondary | ICD-10-CM | POA: Diagnosis not present

## 2020-09-10 LAB — CBC WITH DIFFERENTIAL/PLATELET
Abs Immature Granulocytes: 0.08 10*3/uL — ABNORMAL HIGH (ref 0.00–0.07)
Basophils Absolute: 0 10*3/uL (ref 0.0–0.1)
Basophils Relative: 0 %
Eosinophils Absolute: 0 10*3/uL (ref 0.0–0.5)
Eosinophils Relative: 0 %
HCT: 35 % — ABNORMAL LOW (ref 36.0–46.0)
Hemoglobin: 11.5 g/dL — ABNORMAL LOW (ref 12.0–15.0)
Immature Granulocytes: 1 %
Lymphocytes Relative: 5 %
Lymphs Abs: 0.8 10*3/uL (ref 0.7–4.0)
MCH: 29.4 pg (ref 26.0–34.0)
MCHC: 32.9 g/dL (ref 30.0–36.0)
MCV: 89.5 fL (ref 80.0–100.0)
Monocytes Absolute: 0.9 10*3/uL (ref 0.1–1.0)
Monocytes Relative: 6 %
Neutro Abs: 13.6 10*3/uL — ABNORMAL HIGH (ref 1.7–7.7)
Neutrophils Relative %: 88 %
Platelets: 486 10*3/uL — ABNORMAL HIGH (ref 150–400)
RBC: 3.91 MIL/uL (ref 3.87–5.11)
RDW: 12.1 % (ref 11.5–15.5)
WBC: 15.4 10*3/uL — ABNORMAL HIGH (ref 4.0–10.5)
nRBC: 0 % (ref 0.0–0.2)

## 2020-09-10 LAB — GLUCOSE, CAPILLARY
Glucose-Capillary: 139 mg/dL — ABNORMAL HIGH (ref 70–99)
Glucose-Capillary: 109 mg/dL — ABNORMAL HIGH (ref 70–99)
Glucose-Capillary: 144 mg/dL — ABNORMAL HIGH (ref 70–99)
Glucose-Capillary: 147 mg/dL — ABNORMAL HIGH (ref 70–99)
Glucose-Capillary: 139 mg/dL — ABNORMAL HIGH (ref 70–99)
Glucose-Capillary: 162 mg/dL — ABNORMAL HIGH (ref 70–99)
Glucose-Capillary: 161 mg/dL — ABNORMAL HIGH (ref 70–99)
Glucose-Capillary: 168 mg/dL — ABNORMAL HIGH (ref 70–99)
Glucose-Capillary: 147 mg/dL — ABNORMAL HIGH (ref 70–99)
Glucose-Capillary: 178 mg/dL — ABNORMAL HIGH (ref 70–99)
Glucose-Capillary: 286 mg/dL — ABNORMAL HIGH (ref 70–99)
Glucose-Capillary: 240 mg/dL — ABNORMAL HIGH (ref 70–99)

## 2020-09-10 LAB — BASIC METABOLIC PANEL
Anion gap: 12 (ref 5–15)
BUN: 9 mg/dL (ref 6–20)
CO2: 19 mmol/L — ABNORMAL LOW (ref 22–32)
Calcium: 8.3 mg/dL — ABNORMAL LOW (ref 8.9–10.3)
Chloride: 117 mmol/L — ABNORMAL HIGH (ref 98–111)
Creatinine, Ser: 0.58 mg/dL (ref 0.44–1.00)
GFR, Estimated: >60 mL/min (ref >60)
Glucose, Bld: 172 mg/dL — ABNORMAL HIGH (ref 70–99)
Potassium: 3.3 mmol/L — ABNORMAL LOW (ref 3.5–5.1)
Sodium: 148 mmol/L — ABNORMAL HIGH (ref 135–145)

## 2020-09-10 LAB — MRSA PCR SCREENING: MRSA by PCR: NEGATIVE

## 2020-09-10 LAB — PROCALCITONIN: Procalcitonin: <0.1 ng/mL

## 2020-09-10 MED ORDER — INSULIN ASPART 100 UNIT/ML ~~LOC~~ SOLN
0.0000 [IU] | Freq: Every day | SUBCUTANEOUS | Status: DC
  Administered 2020-09-10 – 2020-09-11 (×2): 2 [IU] via SUBCUTANEOUS

## 2020-09-10 MED ORDER — INSULIN GLARGINE 100 UNIT/ML ~~LOC~~ SOLN
8.0000 [IU] | Freq: Every day | SUBCUTANEOUS | Status: DC
  Administered 2020-09-10 – 2020-09-11 (×2): 8 [IU] via SUBCUTANEOUS
  Filled 2020-09-10 (×2): qty 0.08

## 2020-09-10 MED ORDER — SODIUM CHLORIDE 0.9 % IV SOLN
8.0000 mg | Freq: Three times a day (TID) | INTRAVENOUS | Status: DC
  Administered 2020-09-10 – 2020-09-11 (×5): 8 mg via INTRAVENOUS
  Filled 2020-09-10 (×5): qty 4

## 2020-09-10 MED ORDER — HEPARIN SODIUM (PORCINE) 5000 UNIT/ML IJ SOLN
5000.0000 [IU] | Freq: Three times a day (TID) | INTRAMUSCULAR | Status: DC
  Administered 2020-09-10 – 2020-09-13 (×10): 5000 [IU] via SUBCUTANEOUS
  Filled 2020-09-10 (×10): qty 1

## 2020-09-10 MED ORDER — INSULIN ASPART 100 UNIT/ML ~~LOC~~ SOLN
0.0000 [IU] | Freq: Three times a day (TID) | SUBCUTANEOUS | Status: DC
  Administered 2020-09-10 (×2): 1 [IU] via SUBCUTANEOUS
  Administered 2020-09-10: 18:00:00 3 [IU] via SUBCUTANEOUS
  Administered 2020-09-11: 09:00:00 4 [IU] via SUBCUTANEOUS
  Administered 2020-09-11: 12:00:00 1 [IU] via SUBCUTANEOUS
  Administered 2020-09-12: 17:00:00 3 [IU] via SUBCUTANEOUS
  Administered 2020-09-12: 08:00:00 2 [IU] via SUBCUTANEOUS
  Administered 2020-09-13 (×2): 1 [IU] via SUBCUTANEOUS
  Administered 2020-09-13: 13:00:00 0 [IU] via SUBCUTANEOUS

## 2020-09-10 MED ORDER — PROCHLORPERAZINE EDISYLATE 10 MG/2ML IJ SOLN
5.0000 mg | Freq: Four times a day (QID) | INTRAMUSCULAR | Status: DC | PRN
  Administered 2020-09-11 – 2020-09-12 (×3): 5 mg via INTRAVENOUS
  Filled 2020-09-10 (×3): qty 2

## 2020-09-10 MED ORDER — INSULIN ASPART 100 UNIT/ML ~~LOC~~ SOLN
2.0000 [IU] | Freq: Three times a day (TID) | SUBCUTANEOUS | Status: DC
  Administered 2020-09-10 – 2020-09-13 (×7): 2 [IU] via SUBCUTANEOUS

## 2020-09-10 NOTE — Plan of Care (Signed)
°  Problem: Respiratory: Goal: Will maintain a patent airway Outcome: Progressing   Problem: Clinical Measurements: Goal: Ability to maintain clinical measurements within normal limits will improve Outcome: Progressing Goal: Diagnostic test results will improve Outcome: Progressing Goal: Respiratory complications will improve Outcome: Progressing

## 2020-09-10 NOTE — Progress Notes (Signed)
Inpatient Diabetes Program Recommendations  AACE/ADA: New Consensus Statement on Inpatient Glycemic Control (2015)  Target Ranges:  Prepandial:   less than 140 mg/dL      Peak postprandial:   less than 180 mg/dL (1-2 hours)      Critically ill patients:  140 - 180 mg/dL   Lab Results  Component Value Date   GLUCAP 139 (H) 09/10/2020   HGBA1C 12.7 (H) 09/01/2020    Review of Glycemic Control Results for Shelby Brown, Shelby Brown (MRN [id number]) as of 09/10/2020 07:45  Ref. Range 09/10/2020 02:50 09/10/2020 04:21 09/10/2020 05:23 09/10/2020 06:29  Glucose-Capillary Latest Ref Range: 70 - 99 mg/dL 332 (H) 951 (H) 884 (H) 139 (H)  Diabetes history: DM  Outpatient Diabetes medications:  Metformin 1000 mg bid, Lantus 8 units daily Novolog 0-15 units tid with meals Current orders for Inpatient glycemic control:  Patient transitioning off insulin drip this morning to Lantus 8 units daily, Novolog 0-6 units tid with meals and HS, and Novolog meal coverage 2 units tid with meals Inpatient Diabetes Program Recommendations:    Agree with orders.  Will follow and speak to patient using interpreter when she is feeling better.  Need to reinforce with patient that she needs insulin at home and cannot be managed by only pills.   Thanks,  Beryl Meager, RN, BC-ADM Inpatient Diabetes Coordinator Pager [phone] (8a-5p)

## 2020-09-10 NOTE — Progress Notes (Signed)
PROGRESS NOTE  Shelby Brown  BUL:[id number] DOB: 1980/10/31 DOA: 09/08/2020 PCP: Patient, No Pcp Per   Brief Narrative: Shelby Brown is a 39 y.o. female with a history of poorly controlled IDDM complicated by nonadherence, MDD, and recent admission for DKA with covid-19 infection treated with monoclonal antibody 12/15 prior to discharge on 12/16 who was brought back to the ED 12/22 due to dyspnea, nausea and vomiting in the setting of not taking insulin at home. She was hypothermic, tachycardic, tachypneic without hypoxia, found to be in DKA with venous pH 6.87, bicarbonate <7, anion gap 30. CXR demonstrated new RUL airspace opacity thought to represent superimposed bacterial pneumonia. CRP 3.6, PCT negative. D-dimer >20 and LE U/S ordered.    Assessment & Plan: Principal Problem:   Diabetic ketoacidosis associated with type 2 diabetes mellitus ([place]) Active Problems:   MDD (major depressive disorder)   COVID-19 virus infection   DKA, type 2 ([place])   Lobar pneumonia ([place])  DKA with poorly-controlled IDT2DM: HbA1c 12.7%. ?If atypical diabetes e.g. MODY.  - Transition to basal-bolus insulin, off gtt this AM. Continue serial metabolic monitoring.  - Appointment to establish care at [facility] on 09/23/2019 was scheduled prior to recent discharge. Anticipate requiring insulin at discharge.  Hematemesis, esophagitis: Improving. Hgb trending stable. In setting of previous vomiting, now with some hematemesis, strong suspicion for MW tear, gastritis also possible. No hx PUD.  - Avoid NSAIDs - Protonix gtt to continue. CT shows esophageal thickening consistent with esophagitis. Also had wall thickening of the entire colon that may be due to underdistention. The patient is not having diarrhea. No other emergent findings on CT.  - Discussed with GI, Dr. Alessandra Bevels 12/23, 12/24 who appropriately recommends against endoscopy at this time. Esophageal and colonic thickening  not specific findings. - Start clear liquids if pt desires.   Hyperkalemia: Resolved.   Hypokalemia: after improvement in glucose level. Will continue to supplement in IV fluids given esophagitis/vomiting.  Hypernatremia: Improving.  - Hypotonic saline in IVF   AKI: Qualifies with Cr on arrival of 1.12, coming down to 0.58 with IVF.   Acute urinary retention: Possibly related to autonomic neuropathy.  - Foley catheter to remain in place for now, voiding trial planned in next 24-48 hours.  Covid-19 infection: Dx 12/15, s/p bamlanivimab/etesevimab 12/15.  - No longer likely to benefit from remdesivir and certainly will avoid steroids. Admissions have been due to DKA, not necessarily/directly covid-19. With new infiltrate, it remains possible that symptoms could worsen from here (admitted 1 week after Dx). Remain on isolation.   Sepsis due to superimposed bacterial RUL pneumonia: Working diagnosis at admission, felt to be ruled out. Leukocytosis improving. PCT negative x2. CTA chest reviewed showing no specific consolidations. Remains afebrile. - Stop abx and monitor.    Elevated d-dimer: Venous U/S LE's negative, no PE on CTA.  - Hgb stable, so will start prophylactic heparin and monitor closely.  - Trend d-dimer  MDD:  - Resume home medications once taking po.  DVT prophylaxis: Heparin 5k units q8h Code Status: Full Family Communication: Daughter by phone today. Disposition Plan:  Status is: Inpatient  Remains inpatient appropriate because:Persistent severe electrolyte disturbances, Altered mental status, IV treatments appropriate due to intensity of illness or inability to take PO and Inpatient level of care appropriate due to severity of illness  Dispo: The patient is from: Home              Anticipated d/c is to: Home  Anticipated d/c date is: > 3 days              Patient currently is not medically stable to d/c.  Consultants:   None  Procedures:    None  Antimicrobials:  Levaquin 12/22 >>   Subjective: Spanish video interpretor Jiles Garter [phone] used throughout encounter. She reports feeling only minimally better than when she first came in. Has not tried to eat or drink anything yet. Still having some nausea and vomiting even at time of encounter. Abdominal pain is generalized and unchanged, no chest pain. Does have pain throughout the back without neck pain/stiffness.   Objective: Vitals:   09/10/20 0200 09/10/20 0300 09/10/20 0400 09/10/20 0500  BP: (!) 143/88 (!) 143/77 (!) 139/91 (!) 153/88  Pulse: 88 87 93 89  Resp: _0 Temp: 99.32 F (37.4 C) 99.5 F (37.5 C) 99.68 F (37.6 C) 99.68 F (37.6 C)  TempSrc:      SpO2: 97% 97% 98% 99%  Weight:      Height:        Intake/Output Summary (Last 24 hours) at 09/10/2020 0717 Last data filed at 09/10/2020 0550 Gross per 24 hour  Intake 1979.96 ml  Output 1025 ml  Net 954.96 ml   Filed Weights   09/08/20 1620 09/08/20 2300  Weight: 52 kg 45 kg   Gen: Tired-appearing female in no distress Pulm: Nonlabored breathing room air. Clear. CV: Regular rate and rhythm. No murmur, rub, or gallop. No JVD, no dependent edema. GI: Abdomen soft, scaphoid, uncomfortable but without focal tenderness, non-distended, with normoactive bowel sounds.  Ext: Warm, no deformities Skin: No rashes, lesions or ulcers on visualized skin. Neuro: Alert and oriented. No focal neurological deficits. Psych: Judgement and insight appear fair. Mood depressed & affect congruent. Behavior is appropriate.    Data Reviewed: I have personally reviewed following labs and imaging studies  CBC: Recent Labs  Lab 09/08/20 1704 09/08/20 1729 09/08/20 2237 09/09/20 1005 09/10/20 0502  WBC 19.1*  --  19.8* 15.8* 15.4*  NEUTROABS 17.0*  --   --   --  13.6*  HGB 13.0 16.0* 11.9* 12.0 11.5*  HCT 44.3 47.0* 37.7 36.8 35.0*  MCV 100.7*  --  93.8 91.1 89.5  PLT 582*  --  526* 529* 486*   Basic  Metabolic Panel: Recent Labs  Lab 09/08/20 2053 09/09/20 0312 09/09/20 1259 09/09/20 2047 09/10/20 0502  NA 145 146* 151* 150* 148*  K 4.8 4.4 2.9* 3.2* 3.3*  CL 114* 120* 121* 120* 117*  CO2 <7* 12* 21* 21* 19*  GLUCOSE 413* 279* 172* 168* 172*  BUN _1 CREATININE 1.12* 0.84 0.62 0.60 0.58  CALCIUM 8.8* 8.4* 8.9 8.6* 8.3*   GFR: Estimated Creatinine Clearance: 66.4 mL/min (by C-G formula based on SCr of 0.58 mg/dL). Liver Function Tests: Recent Labs  Lab 09/08/20 1704  AST 17  ALT 15  ALKPHOS 72  BILITOT 1.5*  PROT 8.5*  ALBUMIN 3.5   CBG: Recent Labs  Lab 09/10/20 0143 09/10/20 0250 09/10/20 0421 09/10/20 0523 09/10/20 0629  GLUCAP 139* 109* 144* 147* 139*   Lipid Profile: Recent Labs    09/08/20 1704  TRIG 194*   Thyroid Function Tests: No results for input(s): TSH, T4TOTAL, FREET4, T3FREE, THYROIDAB in the last 72 hours. Anemia Panel: Recent Labs    09/08/20 1704  FERRITIN 109   Urine analysis:    Component Value Date/Time   COLORURINE STRAW (A)  09/08/2020 2024   APPEARANCEUR CLEAR 09/08/2020 2024   LABSPEC 1.018 09/08/2020 2024   PHURINE 5.0 09/08/2020 2024   GLUCOSEU >=500 (A) 09/08/2020 2024   GLUCOSEU > 1000 mg/dL (A) 12/10/2007 2144   HGBUR LARGE (A) 09/08/2020 2024   BILIRUBINUR NEGATIVE 09/08/2020 2024   BILIRUBINUR small (A) 08/06/2017 1044   KETONESUR 80 (A) 09/08/2020 2024   PROTEINUR 100 (A) 09/08/2020 2024   UROBILINOGEN 0.2 08/06/2017 1044   UROBILINOGEN 0.2 11/28/2014 1725   NITRITE NEGATIVE 09/08/2020 2024   LEUKOCYTESUR NEGATIVE 09/08/2020 2024   Recent Results (from the past 240 hour(s))  Culture, blood (routine x 2)     Status: None   Collection Time: 09/01/20  5:33 PM   Specimen: BLOOD  Result Value Ref Range Status   Specimen Description   Final    BLOOD RIGHT ANTECUBITAL Performed at [facility], [place] [address], [place], [place] [address]    Special Requests   Final    BOTTLES  DRAWN AEROBIC AND ANAEROBIC Blood Culture adequate volume Performed at [facility] [address], [place], [place] [address]    Culture   Final    NO GROWTH 5 DAYS Performed at [facility], [place] [address] [place], [place] [address]    Report Status 09/06/2020 FINAL  Final  Resp Panel by RT-PCR (Flu A&B, Covid) Nasopharyngeal Swab     Status: Abnormal   Collection Time: 09/01/20  6:00 PM   Specimen: Nasopharyngeal Swab; Nasopharyngeal(NP) swabs in vial transport medium  Result Value Ref Range Status   SARS Coronavirus 2 by RT PCR POSITIVE (A) NEGATIVE Final    Comment: RESULT CALLED TO, READ BACK BY AND VERIFIED WITH: SMITH,J. RN _0  09/01/20 BILLINGSLEY,L (NOTE) SARS-CoV-2 target nucleic acids are DETECTED.  The SARS-CoV-2 RNA is generally detectable in upper respiratory specimens during the acute phase of infection. Positive results are indicative of the presence of the identified virus, but do not rule out bacterial infection or co-infection with other pathogens not detected by the test. Clinical correlation with patient history and other diagnostic information is necessary to determine patient infection status. The expected result is Negative.  Fact Sheet for Patients: EntrepreneurPulse.com.au  Fact Sheet for Healthcare Providers: IncredibleEmployment.be  This test is not yet approved or cleared by the [place] FDA and  has been authorized for detection and/or diagnosis of SARS-CoV-2 by FDA under an Emergency Use Authorization (EUA).  This EUA will remain in effect (meaning this test  can be used) for the duration of  the COVID-19 declaration under Section 564(b)(1) of the Act, 21 U.S.C. section 360bbb-3(b)(1), unless the authorization is terminated or revoked sooner.     Influenza A by PCR NEGATIVE NEGATIVE Final   Influenza B by PCR NEGATIVE NEGATIVE Final    Comment: (NOTE) The Xpert Xpress  SARS-CoV-2/FLU/RSV plus assay is intended as an aid in the diagnosis of influenza from Nasopharyngeal swab specimens and should not be used as a sole basis for treatment. Nasal washings and aspirates are unacceptable for Xpert Xpress SARS-CoV-2/FLU/RSV testing.  Fact Sheet for Patients: EntrepreneurPulse.com.au  Fact Sheet for Healthcare Providers: IncredibleEmployment.be  This test is not yet approved or cleared by the [place] FDA and has been authorized for detection and/or diagnosis of SARS-CoV-2 by FDA under an Emergency Use Authorization (EUA). This EUA will remain in effect (meaning this test can be used) for the duration of the COVID-19 declaration under Section 564(b)(1) of the Act, 21 U.S.C. section  360bbb-3(b)(1), unless the authorization is terminated or revoked.  Performed at [facility], [place] [address], [place], [place] [address]       Radiology Studies: CT ANGIO CHEST PE W OR WO CONTRAST  Result Date: 09/09/2020 CLINICAL DATA:  Nausea and vomiting.  COVID positive. EXAM: CT ANGIOGRAPHY CHEST CT ABDOMEN AND PELVIS WITH CONTRAST TECHNIQUE: Multidetector CT imaging of the chest was performed using the standard protocol during bolus administration of intravenous contrast. Multiplanar CT image reconstructions and MIPs were obtained to evaluate the vascular anatomy. Multidetector CT imaging of the abdomen and pelvis was performed using the standard protocol during bolus administration of intravenous contrast. CONTRAST:  3m OMNIPAQUE IOHEXOL 350 MG/ML SOLN COMPARISON:  None. FINDINGS: CTA CHEST FINDINGS Cardiovascular: Contrast injection is sufficient to demonstrate satisfactory opacification of the pulmonary arteries to the segmental level. There is no pulmonary embolus or evidence of right heart strain. The size of the main pulmonary artery is normal. Heart size is normal, with no pericardial effusion. The course and  caliber of the aorta are normal. There is no atherosclerotic calcification. Opacification decreased due to pulmonary arterial phase contrast bolus timing. Mediastinum/Nodes: -- No mediastinal lymphadenopathy. -- No hilar lymphadenopathy. -- No axillary lymphadenopathy. -- No supraclavicular lymphadenopathy. -- Normal thyroid gland where visualized. -is mild diffuse esophageal wall thickening. Lungs/Pleura: Diffuse hazy bilateral ground-glass airspace opacities are noted. There is no pneumothorax or large pleural effusion. The trachea is unremarkable. Musculoskeletal: No chest wall abnormality. No bony spinal canal stenosis. Review of the MIP images confirms the above findings. CT ABDOMEN and PELVIS FINDINGS Hepatobiliary: The liver is normal. Normal gallbladder.There is no biliary ductal dilation. Pancreas: Normal contours without ductal dilatation. No peripancreatic fluid collection. Spleen: Unremarkable. Adrenals/Urinary Tract: --Adrenal glands: Unremarkable. --Right kidney/ureter: No hydronephrosis or radiopaque kidney stones. --Left kidney/ureter: No hydronephrosis or radiopaque kidney stones. --Urinary bladder: There is a Foley catheter within the urinary bladder. Stomach/Bowel: --Stomach/Duodenum: No hiatal hernia or other gastric abnormality. Normal duodenal course and caliber. --Small bowel: Unremarkable. --Colon: There is diffuse wall thickening of virtually all of the colon. The colon is underdistended. --Appendix: Normal. Vascular/Lymphatic: Normal course and caliber of the major abdominal vessels. --No retroperitoneal lymphadenopathy. --No mesenteric lymphadenopathy. --No pelvic or inguinal lymphadenopathy. Reproductive: Unremarkable Other: No ascites or free air. The abdominal wall is normal. Musculoskeletal. No acute displaced fractures. Review of the MIP images confirms the above findings. IMPRESSION: 1. No acute pulmonary embolism. 2. Diffuse hazy bilateral ground-glass airspace opacities, consistent  with the patient's history of viral pneumonia. 3. Diffuse wall thickening of virtually all of the colon. This may be secondary to underdistention versus colitis. Correlation with the patient's symptoms is recommended. 4. Mild diffuse esophageal wall thickening, suggestive of esophagitis. Electronically Signed   By: CConstance HolsterM.D.   On: 09/09/2020 17:42   CT ABDOMEN PELVIS W CONTRAST  Result Date: 09/09/2020 CLINICAL DATA:  Nausea and vomiting.  COVID positive. EXAM: CT ANGIOGRAPHY CHEST CT ABDOMEN AND PELVIS WITH CONTRAST TECHNIQUE: Multidetector CT imaging of the chest was performed using the standard protocol during bolus administration of intravenous contrast. Multiplanar CT image reconstructions and MIPs were obtained to evaluate the vascular anatomy. Multidetector CT imaging of the abdomen and pelvis was performed using the standard protocol during bolus administration of intravenous contrast. CONTRAST:  821mOMNIPAQUE IOHEXOL 350 MG/ML SOLN COMPARISON:  None. FINDINGS: CTA CHEST FINDINGS Cardiovascular: Contrast injection is sufficient to demonstrate satisfactory opacification of the pulmonary arteries to the segmental level. There is no pulmonary embolus  or evidence of right heart strain. The size of the main pulmonary artery is normal. Heart size is normal, with no pericardial effusion. The course and caliber of the aorta are normal. There is no atherosclerotic calcification. Opacification decreased due to pulmonary arterial phase contrast bolus timing. Mediastinum/Nodes: -- No mediastinal lymphadenopathy. -- No hilar lymphadenopathy. -- No axillary lymphadenopathy. -- No supraclavicular lymphadenopathy. -- Normal thyroid gland where visualized. -is mild diffuse esophageal wall thickening. Lungs/Pleura: Diffuse hazy bilateral ground-glass airspace opacities are noted. There is no pneumothorax or large pleural effusion. The trachea is unremarkable. Musculoskeletal: No chest wall abnormality. No  bony spinal canal stenosis. Review of the MIP images confirms the above findings. CT ABDOMEN and PELVIS FINDINGS Hepatobiliary: The liver is normal. Normal gallbladder.There is no biliary ductal dilation. Pancreas: Normal contours without ductal dilatation. No peripancreatic fluid collection. Spleen: Unremarkable. Adrenals/Urinary Tract: --Adrenal glands: Unremarkable. --Right kidney/ureter: No hydronephrosis or radiopaque kidney stones. --Left kidney/ureter: No hydronephrosis or radiopaque kidney stones. --Urinary bladder: There is a Foley catheter within the urinary bladder. Stomach/Bowel: --Stomach/Duodenum: No hiatal hernia or other gastric abnormality. Normal duodenal course and caliber. --Small bowel: Unremarkable. --Colon: There is diffuse wall thickening of virtually all of the colon. The colon is underdistended. --Appendix: Normal. Vascular/Lymphatic: Normal course and caliber of the major abdominal vessels. --No retroperitoneal lymphadenopathy. --No mesenteric lymphadenopathy. --No pelvic or inguinal lymphadenopathy. Reproductive: Unremarkable Other: No ascites or free air. The abdominal wall is normal. Musculoskeletal. No acute displaced fractures. Review of the MIP images confirms the above findings. IMPRESSION: 1. No acute pulmonary embolism. 2. Diffuse hazy bilateral ground-glass airspace opacities, consistent with the patient's history of viral pneumonia. 3. Diffuse wall thickening of virtually all of the colon. This may be secondary to underdistention versus colitis. Correlation with the patient's symptoms is recommended. 4. Mild diffuse esophageal wall thickening, suggestive of esophagitis. Electronically Signed   By: Constance Holster M.D.   On: 09/09/2020 17:42   DG Chest Port 1 View  Result Date: 09/08/2020 CLINICAL DATA:  Shortness of breath. Insulin-dependent diabetes. COVID-19 virus infection. EXAM: PORTABLE CHEST 1 VIEW COMPARISON:  09/01/2020 FINDINGS: The heart size and mediastinal  contours are within normal limits. New patchy airspace opacity is seen in the inferior aspect of the right upper lobe, suspicious for pneumonia. Left lung is clear. No evidence of pleural effusion. IMPRESSION: New right upper lobe airspace opacity, suspicious for pneumonia. Continued radiographic follow-up is recommended to confirm resolution. Electronically Signed   By: Marlaine Hind M.D.   On: 09/08/2020 18:18   VAS [place] LOWER EXTREMITY VENOUS (DVT)  Result Date: 09/09/2020  Lower Venous DVT Study Indications: Elevated Ddimer.  Risk Factors: COVID 19 positive. Comparison Study: No prior studies. Performing Technologist: Oliver Hum RVT  Examination Guidelines: A complete evaluation includes B-mode imaging, spectral Doppler, color Doppler, and power Doppler as needed of all accessible portions of each vessel. Bilateral testing is considered an integral part of a complete examination. Limited examinations for reoccurring indications may be performed as noted. The reflux portion of the exam is performed with the patient in reverse Trendelenburg.  +---------+---------------+---------+-----------+----------+--------------+ RIGHT    CompressibilityPhasicitySpontaneityPropertiesThrombus Aging +---------+---------------+---------+-----------+----------+--------------+ CFV      Full           Yes      Yes                                 +---------+---------------+---------+-----------+----------+--------------+ SFJ      Full                                                        +---------+---------------+---------+-----------+----------+--------------+  FV Prox  Full                                                        +---------+---------------+---------+-----------+----------+--------------+ FV Mid   Full                                                        +---------+---------------+---------+-----------+----------+--------------+ FV DistalFull                                                         +---------+---------------+---------+-----------+----------+--------------+ PFV      Full                                                        +---------+---------------+---------+-----------+----------+--------------+ POP      Full           Yes      Yes                                 +---------+---------------+---------+-----------+----------+--------------+ PTV      Full                                                        +---------+---------------+---------+-----------+----------+--------------+ PERO     Full                                                        +---------+---------------+---------+-----------+----------+--------------+   +---------+---------------+---------+-----------+----------+--------------+ LEFT     CompressibilityPhasicitySpontaneityPropertiesThrombus Aging +---------+---------------+---------+-----------+----------+--------------+ CFV      Full           Yes      Yes                                 +---------+---------------+---------+-----------+----------+--------------+ SFJ      Full                                                        +---------+---------------+---------+-----------+----------+--------------+ FV Prox  Full                                                        +---------+---------------+---------+-----------+----------+--------------+  FV Mid   Full                                                        +---------+---------------+---------+-----------+----------+--------------+ FV DistalFull                                                        +---------+---------------+---------+-----------+----------+--------------+ PFV      Full                                                        +---------+---------------+---------+-----------+----------+--------------+ POP      Full           Yes      Yes                                  +---------+---------------+---------+-----------+----------+--------------+ PTV      Full                                                        +---------+---------------+---------+-----------+----------+--------------+ PERO     Full                                                        +---------+---------------+---------+-----------+----------+--------------+     Summary: RIGHT: - There is no evidence of deep vein thrombosis in the lower extremity.  - No cystic structure found in the popliteal fossa.  LEFT: - There is no evidence of deep vein thrombosis in the lower extremity.  - No cystic structure found in the popliteal fossa.  *See table(s) above for measurements and observations. Electronically signed by Monica Martinez MD on 09/09/2020 at 3:52:49 PM.    Final     Scheduled Meds: . Chlorhexidine Gluconate Cloth  6 each Topical Daily  . heparin injection (subcutaneous)  5,000 Units Subcutaneous Q8H  . insulin aspart  0-5 Units Subcutaneous QHS  . insulin aspart  0-6 Units Subcutaneous TID WC  . insulin aspart  2 Units Subcutaneous TID WC  . insulin glargine  8 Units Subcutaneous Daily  . insulin starter kit- pen needles  1 kit Other Once  . living well with diabetes book- in spanish   Does not apply Once  . mouth rinse  15 mL Mouth Rinse BID  . [START ON 09/13/2020] pantoprazole  40 mg Intravenous Q12H   Continuous Infusions: . dextrose 5 % and 0.45 % NaCl with KCl 40 mEq/L Stopped (09/10/20 0252)  . insulin 2.2 Units/hr (09/10/20 0631)  . levofloxacin (LEVAQUIN) IV Stopped (09/10/20 0418)  . pantoprozole (PROTONIX) infusion 8 mg/hr (09/10/20 4742)  LOS: 2 days   Time spent: 35 minutes.  Patrecia Pour, MD [facility] [email or web address] 09/10/2020, 7:17 AM

## 2020-09-11 DIAGNOSIS — J181 Lobar pneumonia, unspecified organism: Secondary | ICD-10-CM | POA: Diagnosis not present

## 2020-09-11 DIAGNOSIS — E1110 Type 2 diabetes mellitus with ketoacidosis without coma: Secondary | ICD-10-CM | POA: Diagnosis not present

## 2020-09-11 DIAGNOSIS — U071 COVID-19: Secondary | ICD-10-CM | POA: Diagnosis not present

## 2020-09-11 DIAGNOSIS — F332 Major depressive disorder, recurrent severe without psychotic features: Secondary | ICD-10-CM | POA: Diagnosis not present

## 2020-09-11 LAB — CBC
HCT: 34.6 % — ABNORMAL LOW (ref 36.0–46.0)
Hemoglobin: 11.4 g/dL — ABNORMAL LOW (ref 12.0–15.0)
MCH: 29.6 pg (ref 26.0–34.0)
MCHC: 32.9 g/dL (ref 30.0–36.0)
MCV: 89.9 fL (ref 80.0–100.0)
Platelets: 468 10*3/uL — ABNORMAL HIGH (ref 150–400)
RBC: 3.85 MIL/uL — ABNORMAL LOW (ref 3.87–5.11)
RDW: 12.1 % (ref 11.5–15.5)
WBC: 8.8 10*3/uL (ref 4.0–10.5)
nRBC: 0 % (ref 0.0–0.2)

## 2020-09-11 LAB — COMPREHENSIVE METABOLIC PANEL
ALT: 11 U/L (ref 0–44)
AST: 13 U/L — ABNORMAL LOW (ref 15–41)
Albumin: 2.6 g/dL — ABNORMAL LOW (ref 3.5–5.0)
Alkaline Phosphatase: 53 U/L (ref 38–126)
Anion gap: 10 (ref 5–15)
BUN: 6 mg/dL (ref 6–20)
CO2: 27 mmol/L (ref 22–32)
Calcium: 7.8 mg/dL — ABNORMAL LOW (ref 8.9–10.3)
Chloride: 107 mmol/L (ref 98–111)
Creatinine, Ser: 0.5 mg/dL (ref 0.44–1.00)
GFR, Estimated: >60 mL/min (ref >60)
Glucose, Bld: 231 mg/dL — ABNORMAL HIGH (ref 70–99)
Potassium: 3 mmol/L — ABNORMAL LOW (ref 3.5–5.1)
Sodium: 144 mmol/L (ref 135–145)
Total Bilirubin: 0.4 mg/dL (ref 0.3–1.2)
Total Protein: 6.2 g/dL — ABNORMAL LOW (ref 6.5–8.1)

## 2020-09-11 LAB — GLUCOSE, CAPILLARY
Glucose-Capillary: 307 mg/dL — ABNORMAL HIGH (ref 70–99)
Glucose-Capillary: 200 mg/dL — ABNORMAL HIGH (ref 70–99)
Glucose-Capillary: 118 mg/dL — ABNORMAL HIGH (ref 70–99)
Glucose-Capillary: 201 mg/dL — ABNORMAL HIGH (ref 70–99)

## 2020-09-11 LAB — D-DIMER, QUANTITATIVE: D-Dimer, Quant: 0.81 ug/mL-FEU — ABNORMAL HIGH (ref 0.00–0.50)

## 2020-09-11 LAB — C-REACTIVE PROTEIN: CRP: 0.7 mg/dL (ref <1.0)

## 2020-09-11 MED ORDER — SODIUM CHLORIDE 0.9 % IV SOLN: INTRAVENOUS | Status: DC

## 2020-09-11 MED ORDER — METOCLOPRAMIDE HCL 5 MG/ML IJ SOLN
5.0000 mg | Freq: Three times a day (TID) | INTRAMUSCULAR | Status: DC
  Administered 2020-09-11 – 2020-09-13 (×7): 5 mg via INTRAVENOUS
  Filled 2020-09-11 (×7): qty 2

## 2020-09-11 MED ORDER — METOCLOPRAMIDE HCL 5 MG/ML IJ SOLN: 5.0000 mg | Freq: Three times a day (TID) | INTRAMUSCULAR | Status: DC

## 2020-09-11 MED ORDER — INSULIN GLARGINE 100 UNIT/ML ~~LOC~~ SOLN
10.0000 [IU] | Freq: Every day | SUBCUTANEOUS | Status: DC
  Administered 2020-09-12 – 2020-09-13 (×2): 10 [IU] via SUBCUTANEOUS
  Filled 2020-09-11 (×2): qty 0.1

## 2020-09-11 MED ORDER — POTASSIUM CHLORIDE 10 MEQ/100ML IV SOLN
10.0000 meq | INTRAVENOUS | Status: AC
  Administered 2020-09-11 (×4): 10 meq via INTRAVENOUS
  Filled 2020-09-11 (×4): qty 100

## 2020-09-11 NOTE — Plan of Care (Signed)
  Problem: Education: Goal: Knowledge of risk factors and measures for prevention of condition will improve Outcome: Progressing   Problem: Respiratory: Goal: Will maintain a patent airway Outcome: Progressing   Problem: Education: Goal: Knowledge of General Education information will improve Description: Including pain rating scale, medication(s)/side effects and non-pharmacologic comfort measures Outcome: Progressing   Problem: Health Behavior/Discharge Planning: Goal: Ability to manage health-related needs will improve Outcome: Not Progressing   Problem: Clinical Measurements: Goal: Respiratory complications will improve Outcome: Progressing   Problem: Nutrition: Goal: Adequate nutrition will be maintained Outcome: Not Progressing

## 2020-09-11 NOTE — Progress Notes (Addendum)
PROGRESS NOTE  Shelby Brown  ZOX:[id number] DOB: 1981/07/07 DOA: 09/08/2020 PCP: Patient, No Pcp Per   Brief Narrative: Shelby Brown is a 39 y.o. female with a history of poorly controlled IDDM complicated by nonadherence, MDD, and recent admission for DKA with covid-19 infection treated with monoclonal antibody 12/15 prior to discharge on 12/16 who was brought back to the ED 12/22 due to dyspnea, nausea and vomiting in the setting of not taking insulin at home. She was hypothermic, tachycardic, tachypneic without hypoxia, found to be in DKA with venous pH 6.87, bicarbonate <7, anion gap 30. CXR demonstrated new RUL airspace opacity thought to represent superimposed bacterial pneumonia. CRP 3.6, PCT negative. D-dimer >20 and LE U/S negative.    Assessment & Plan: Principal Problem:   Diabetic ketoacidosis associated with type 2 diabetes mellitus (HCC) Active Problems:   MDD (major depressive disorder)   COVID-19 virus infection   DKA, type 2 (HCC)   Lobar pneumonia (HCC)  DKA with poorly-controlled IDT2DM: HbA1c 12.7%. ?If atypical diabetes e.g. MODY, resolved.  - Transition to basal-bolus insulin, off gtt this AM. Continue serial metabolic monitoring.  - Appointment to establish care at [facility] on 09/23/2019 was scheduled prior to recent discharge. Anticipate requiring insulin at discharge.  Hematemesis likely 2/2 esophagitis: Improving. In setting of previous vomiting, now with some hematemesis, strong suspicion for MW tear, gastritis also possible. No hx PUD.  - Avoid NSAIDs - Protonix gtt discontinued. CT shows esophageal thickening consistent with esophagitis. Also had wall thickening of the entire colon that may be due to underdistention. The patient is not having diarrhea. No other emergent findings on CT.  - Discussed with GI, Dr. Levora Angel 12/23, 12/24 who appropriately recommends against endoscopy at this time. Esophageal and colonic thickening  not specific findings. - Advance clear liquid diet as tolerated - still requiring multiple doses of anti-emetics daily to control symptoms  Hyperkalemia: Resolved Hypokalemia: after improvement in glucose level. Will continue to supplement in IV fluids given esophagitis/vomiting\ - Continue to monitor - supplemented today  Hypernatremia: Improving.  - Restart IVF given poor PO intake  AKI: Qualifies with Cr on arrival of 1.12, coming down to 0.58 with IVF.   Acute urinary retention: Possibly related to autonomic neuropathy.  - Foley catheter to remain in place for now, voiding trial planned in next 24-48 hours.  Covid-19 infection: Dx 12/15, s/p bamlanivimab/etesevimab 12/15.  - No longer likely to benefit from remdesivir and certainly will avoid steroids. Admissions have been due to DKA, not necessarily/directly covid-19. With new infiltrate, it remains possible that symptoms could worsen from here (admitted 1 week after Dx). Remain on isolation.   Sepsis due to superimposed bacterial RUL pneumonia, RULED OUT: Working diagnosis at admission, felt to be ruled out. Leukocytosis improving. PCT negative x2. CTA chest reviewed showing no specific consolidations. Remains afebrile. - Stop abx and monitor.    Elevated d-dimer: Venous U/S LE's negative, no PE on CTA.  - Hgb stable, so will start prophylactic heparin and monitor closely.  - Trend d-dimer  MDD:  - Resume home medications once taking po.  DVT prophylaxis: Heparin 5k units q8h Code Status: Full Family Communication: Daughter by phone today. Disposition Plan:  Status is: Inpatient  Remains inpatient appropriate because:Persistent severe electrolyte disturbances, Altered mental status, IV treatments appropriate due to intensity of illness or inability to take PO and Inpatient level of care appropriate due to severity of illness  Dispo: The patient is from: Home  Anticipated d/c is to: Home              Anticipated  d/c date is: 48-72h              Patient currently is not medically stable to d/c.  Consultants:   None  Procedures:   None  Antimicrobials:  Levaquin stopped  Subjective: No acute issues/events overnight. Ongoing nausea without overt vomiting at bedside this am.  Objective: Vitals:   09/10/20 1500 09/10/20 1600 09/10/20 1700 09/10/20 1800  BP: (!) 151/87 (!) 159/90 (!) 170/90 (!) 161/89  Pulse: 86 84 87 80  Resp: Temp: 99.86 F (37.7 C) 99.86 F (37.7 C) 99.86 F (37.7 C) 100.04 F (37.8 C)  TempSrc:  Bladder    SpO2: 98% 96% 99% 98%  Weight:      Height:        Intake/Output Summary (Last 24 hours) at 09/11/2020 1610 Last data filed at 09/10/2020 2137 Gross per 24 hour  Intake 2019.67 ml  Output 1600 ml  Net 419.67 ml   Filed Weights   09/08/20 1620 09/08/20 2300  Weight: 52 kg 45 kg   Gen: Tired-appearing female in no distress Pulm: Nonlabored breathing room air. Clear. CV: Regular rate and rhythm. No murmur, rub, or gallop. No JVD, no dependent edema. GI: Abdomen soft, uncomfortable but without focal tenderness, non-distended, with normoactive bowel sounds.  Ext: Warm, no deformities Skin: No rashes, lesions or ulcers on visualized skin. Neuro: Alert and oriented. No focal neurological deficits. Psych: Judgement and insight appear fair. Mood depressed & affect congruent. Behavior is appropriate.    Data Reviewed: I have personally reviewed following labs and imaging studies  CBC: Recent Labs  Lab 09/08/20 1704 09/08/20 1729 09/08/20 2237 09/09/20 1005 09/10/20 0502 09/11/20 0216  WBC 19.1*  --  19.8* 15.8* 15.4* 8.8  NEUTROABS 17.0*  --   --   --  13.6*  --   HGB 13.0 16.0* 11.9* 12.0 11.5* 11.4*  HCT 44.3 47.0* 37.7 36.8 35.0* 34.6*  MCV 100.7*  --  93.8 91.1 89.5 89.9  PLT 582*  --  526* 529* 486* 468*   Basic Metabolic Panel: Recent Labs  Lab 09/09/20 0312 09/09/20 1259 09/09/20 2047 09/10/20 0502 09/11/20 0216  NA  146* 151* 150* 148* 144  K 4.4 2.9* 3.2* 3.3* 3.0*  CL 120* 121* 120* 117* 107  CO2 12* 21* 21* 19* 27  GLUCOSE 279* 172* 168* 172* 231*  BUN CREATININE 0.84 0.62 0.60 0.58 0.50  CALCIUM 8.4* 8.9 8.6* 8.3* 7.8*   GFR: Estimated Creatinine Clearance: 66.4 mL/min (by C-G formula based on SCr of 0.5 mg/dL). Liver Function Tests: Recent Labs  Lab 09/08/20 1704 09/11/20 0216  AST 17 13*  ALT 15 11  ALKPHOS 72 53  BILITOT 1.5* 0.4  PROT 8.5* 6.2*  ALBUMIN 3.5 2.6*   CBG: Recent Labs  Lab 09/10/20 0939 09/10/20 1042 09/10/20 1220 09/10/20 1713 09/10/20 2127  GLUCAP 168* 147* 178* 286* 240*   Lipid Profile: Recent Labs    09/08/20 1704  TRIG 194*   Thyroid Function Tests: No results for input(s): TSH, T4TOTAL, FREET4, T3FREE, THYROIDAB in the last 72 hours. Anemia Panel: Recent Labs    09/08/20 1704  FERRITIN 109   Urine analysis:    Component Value Date/Time   COLORURINE STRAW (A) 09/08/2020 2024   APPEARANCEUR CLEAR 09/08/2020 2024   LABSPEC 1.018 09/08/2020 2024  PHURINE 5.0 09/08/2020 2024   GLUCOSEU >=500 (A) 09/08/2020 2024   GLUCOSEU > 1000 mg/dL (A) 42/59/5638 7564   HGBUR LARGE (A) 09/08/2020 2024   BILIRUBINUR NEGATIVE 09/08/2020 2024   BILIRUBINUR small (A) 08/06/2017 1044   KETONESUR 80 (A) 09/08/2020 2024   PROTEINUR 100 (A) 09/08/2020 2024   UROBILINOGEN 0.2 08/06/2017 1044   UROBILINOGEN 0.2 11/28/2014 1725   NITRITE NEGATIVE 09/08/2020 2024   LEUKOCYTESUR NEGATIVE 09/08/2020 2024   Recent Results (from the past 240 hour(s))  Culture, blood (routine x 2)     Status: None   Collection Time: 09/01/20  5:33 PM   Specimen: BLOOD  Result Value Ref Range Status   Specimen Description   Final    BLOOD RIGHT ANTECUBITAL Performed at [facility], [address], [place], [place] [address]    Special Requests   Final    BOTTLES DRAWN AEROBIC AND ANAEROBIC Blood Culture adequate volume Performed at [facility], [address] [place], [place] [address]    Culture   Final    NO GROWTH 5 DAYS Performed at [facility], [address], [place], [place] [address]    Report Status 09/06/2020 FINAL  Final  Resp Panel by RT-PCR (Flu A&B, Covid) Nasopharyngeal Swab     Status: Abnormal   Collection Time: 09/01/20  6:00 PM   Specimen: Nasopharyngeal Swab; Nasopharyngeal(NP) swabs in vial transport medium  Result Value Ref Range Status   SARS Coronavirus 2 by RT PCR POSITIVE (A) NEGATIVE Final    Comment: RESULT CALLED TO, READ BACK BY AND VERIFIED WITH: SMITH,J. RN @2028  09/01/20 BILLINGSLEY,L (NOTE) SARS-CoV-2 target nucleic acids are DETECTED.  The SARS-CoV-2 RNA is generally detectable in upper respiratory specimens during the acute phase of infection. Positive results are indicative of the presence of the identified virus, but do not rule out bacterial infection or co-infection with other pathogens not detected by the test. Clinical correlation with patient history and other diagnostic information is necessary to determine patient infection status. The expected result is Negative.  Fact Sheet for Patients: 09/03/20  Fact Sheet for Healthcare Providers: BloggerCourse.com  This test is not yet approved or cleared by the [employer]ious[employer]ker.[employer] FDA and  has been authorized for detection and/or diagnosis of SARS-CoV-2 by FDA under an Emergency Use Authorization (EUA).  This EUA will remain in effect (meaning this test  can be used) for the duration of  the COVID-19 declaration under Section 564(b)(1) of the Act, 21 U.S.C. section 360bbb-3(b)(1), unless the authorization is terminated or revoked sooner.     Influenza A by PCR NEGATIVE NEGATIVE Final   Influenza B by PCR NEGATIVE NEGATIVE Final    Comment: (NOTE) The Xpert Xpress SARS-CoV-2/FLU/RSV plus assay is intended as an aid in the diagnosis of influenza from  Nasopharyngeal swab specimens and should not be used as a sole basis for treatment. Nasal washings and aspirates are unacceptable for Xpert Xpress SARS-CoV-2/FLU/RSV testing.  Fact Sheet for Patients: [place]  Fact Sheet for Healthcare Providers: BloggerCourse.com  This test is not yet approved or cleared by the SeriousBroker.it FDA and has been authorized for detection and/or diagnosis of SARS-CoV-2 by FDA under an Emergency Use Authorization (EUA). This EUA will remain in effect (meaning this test can be used) for the duration of the COVID-19 declaration under Section 564(b)(1) of the Act, 21 U.S.C. section 360bbb-3(b)(1), unless the authorization is terminated or revoked.  Performed at [facility], [address] [place], [place] [address]   MRSA PCR Screening     Status: None   Collection Time: 09/10/20 10:05 AM   Specimen: Nasal Mucosa; Nasopharyngeal  Result Value Ref Range Status   MRSA by PCR NEGATIVE NEGATIVE Final    Comment:        The GeneXpert MRSA Assay (FDA approved for NASAL specimens only), is one component of a comprehensive MRSA colonization surveillance program. It is not intended to diagnose MRSA infection nor to guide or monitor treatment for MRSA infections. Performed at [facility], [address], [place], [place] [address]       Radiology Studies: CT ANGIO CHEST PE W OR WO CONTRAST  Result Date: 09/09/2020 CLINICAL DATA:  Nausea and vomiting.  COVID positive. EXAM: CT ANGIOGRAPHY CHEST CT ABDOMEN AND PELVIS WITH CONTRAST TECHNIQUE: Multidetector CT imaging of the chest was performed using the standard protocol during bolus administration of intravenous contrast. Multiplanar CT image reconstructions and MIPs were obtained to evaluate the vascular anatomy. Multidetector CT imaging of the abdomen and pelvis was performed using the standard protocol during  bolus administration of intravenous contrast. CONTRAST:  13mL OMNIPAQUE IOHEXOL 350 MG/ML SOLN COMPARISON:  None. FINDINGS: CTA CHEST FINDINGS Cardiovascular: Contrast injection is sufficient to demonstrate satisfactory opacification of the pulmonary arteries to the segmental level. There is no pulmonary embolus or evidence of right heart strain. The size of the main pulmonary artery is normal. Heart size is normal, with no pericardial effusion. The course and caliber of the aorta are normal. There is no atherosclerotic calcification. Opacification decreased due to pulmonary arterial phase contrast bolus timing. Mediastinum/Nodes: -- No mediastinal lymphadenopathy. -- No hilar lymphadenopathy. -- No axillary lymphadenopathy. -- No supraclavicular lymphadenopathy. -- Normal thyroid gland where visualized. -is mild diffuse esophageal wall thickening. Lungs/Pleura: Diffuse hazy bilateral ground-glass airspace opacities are noted. There is no pneumothorax or large pleural effusion. The trachea is unremarkable. Musculoskeletal: No chest wall abnormality. No bony spinal canal stenosis. Review of the MIP images confirms the above findings. CT ABDOMEN and PELVIS FINDINGS Hepatobiliary: The liver is normal. Normal gallbladder.There is no biliary ductal dilation. Pancreas: Normal contours without ductal dilatation. No peripancreatic fluid collection. Spleen: Unremarkable. Adrenals/Urinary Tract: --Adrenal glands: Unremarkable. --Right kidney/ureter: No hydronephrosis or radiopaque kidney stones. --Left kidney/ureter: No hydronephrosis or radiopaque kidney stones. --Urinary bladder: There is a Foley catheter within the urinary bladder. Stomach/Bowel: --Stomach/Duodenum: No hiatal hernia or other gastric abnormality. Normal duodenal course and caliber. --Small bowel: Unremarkable. --Colon: There is diffuse wall thickening of virtually all of the colon. The colon is underdistended. --Appendix: Normal. Vascular/Lymphatic: Normal  course and caliber of the major abdominal vessels. --No retroperitoneal lymphadenopathy. --No mesenteric lymphadenopathy. --No pelvic or inguinal lymphadenopathy. Reproductive: Unremarkable Other: No ascites or free air. The abdominal wall is normal. Musculoskeletal. No acute displaced fractures. Review of the MIP images confirms the above findings. IMPRESSION: 1. No acute pulmonary embolism. 2. Diffuse hazy bilateral ground-glass airspace opacities, consistent with the patient's history of viral pneumonia. 3. Diffuse wall thickening of virtually all of the colon. This may be secondary to underdistention versus colitis. Correlation with the patient's symptoms is recommended. 4. Mild diffuse esophageal wall thickening, suggestive of esophagitis. Electronically Signed   By: Katherine Mantle M.D.   On: 09/09/2020 17:42   CT ABDOMEN PELVIS W CONTRAST  Result Date: 09/09/2020 CLINICAL DATA:  Nausea and vomiting.  COVID positive. EXAM: CT ANGIOGRAPHY CHEST CT ABDOMEN AND PELVIS WITH CONTRAST TECHNIQUE: Multidetector CT imaging of the  chest was performed using the standard protocol during bolus administration of intravenous contrast. Multiplanar CT image reconstructions and MIPs were obtained to evaluate the vascular anatomy. Multidetector CT imaging of the abdomen and pelvis was performed using the standard protocol during bolus administration of intravenous contrast. CONTRAST:  30mL OMNIPAQUE IOHEXOL 350 MG/ML SOLN COMPARISON:  None. FINDINGS: CTA CHEST FINDINGS Cardiovascular: Contrast injection is sufficient to demonstrate satisfactory opacification of the pulmonary arteries to the segmental level. There is no pulmonary embolus or evidence of right heart strain. The size of the main pulmonary artery is normal. Heart size is normal, with no pericardial effusion. The course and caliber of the aorta are normal. There is no atherosclerotic calcification. Opacification decreased due to pulmonary arterial phase  contrast bolus timing. Mediastinum/Nodes: -- No mediastinal lymphadenopathy. -- No hilar lymphadenopathy. -- No axillary lymphadenopathy. -- No supraclavicular lymphadenopathy. -- Normal thyroid gland where visualized. -is mild diffuse esophageal wall thickening. Lungs/Pleura: Diffuse hazy bilateral ground-glass airspace opacities are noted. There is no pneumothorax or large pleural effusion. The trachea is unremarkable. Musculoskeletal: No chest wall abnormality. No bony spinal canal stenosis. Review of the MIP images confirms the above findings. CT ABDOMEN and PELVIS FINDINGS Hepatobiliary: The liver is normal. Normal gallbladder.There is no biliary ductal dilation. Pancreas: Normal contours without ductal dilatation. No peripancreatic fluid collection. Spleen: Unremarkable. Adrenals/Urinary Tract: --Adrenal glands: Unremarkable. --Right kidney/ureter: No hydronephrosis or radiopaque kidney stones. --Left kidney/ureter: No hydronephrosis or radiopaque kidney stones. --Urinary bladder: There is a Foley catheter within the urinary bladder. Stomach/Bowel: --Stomach/Duodenum: No hiatal hernia or other gastric abnormality. Normal duodenal course and caliber. --Small bowel: Unremarkable. --Colon: There is diffuse wall thickening of virtually all of the colon. The colon is underdistended. --Appendix: Normal. Vascular/Lymphatic: Normal course and caliber of the major abdominal vessels. --No retroperitoneal lymphadenopathy. --No mesenteric lymphadenopathy. --No pelvic or inguinal lymphadenopathy. Reproductive: Unremarkable Other: No ascites or free air. The abdominal wall is normal. Musculoskeletal. No acute displaced fractures. Review of the MIP images confirms the above findings. IMPRESSION: 1. No acute pulmonary embolism. 2. Diffuse hazy bilateral ground-glass airspace opacities, consistent with the patient's history of viral pneumonia. 3. Diffuse wall thickening of virtually all of the colon. This may be secondary to  underdistention versus colitis. Correlation with the patient's symptoms is recommended. 4. Mild diffuse esophageal wall thickening, suggestive of esophagitis. Electronically Signed   By: Katherine Mantle M.D.   On: 09/09/2020 17:42   VAS [place] LOWER EXTREMITY VENOUS (DVT)  Result Date: 09/09/2020  Lower Venous DVT Study Indications: Elevated Ddimer.  Risk Factors: COVID 19 positive. Comparison Study: No prior studies. Performing Technologist: Chanda Busing RVT  Examination Guidelines: A complete evaluation includes B-mode imaging, spectral Doppler, color Doppler, and power Doppler as needed of all accessible portions of each vessel. Bilateral testing is considered an integral part of a complete examination. Limited examinations for reoccurring indications may be performed as noted. The reflux portion of the exam is performed with the patient in reverse Trendelenburg.  +---------+---------------+---------+-----------+----------+--------------+ RIGHT    CompressibilityPhasicitySpontaneityPropertiesThrombus Aging +---------+---------------+---------+-----------+----------+--------------+ CFV      Full           Yes      Yes                                 +---------+---------------+---------+-----------+----------+--------------+ SFJ      Full                                                        +---------+---------------+---------+-----------+----------+--------------+  FV Prox  Full                                                        +---------+---------------+---------+-----------+----------+--------------+ FV Mid   Full                                                        +---------+---------------+---------+-----------+----------+--------------+ FV DistalFull                                                        +---------+---------------+---------+-----------+----------+--------------+ PFV      Full                                                         +---------+---------------+---------+-----------+----------+--------------+ POP      Full           Yes      Yes                                 +---------+---------------+---------+-----------+----------+--------------+ PTV      Full                                                        +---------+---------------+---------+-----------+----------+--------------+ PERO     Full                                                        +---------+---------------+---------+-----------+----------+--------------+   +---------+---------------+---------+-----------+----------+--------------+ LEFT     CompressibilityPhasicitySpontaneityPropertiesThrombus Aging +---------+---------------+---------+-----------+----------+--------------+ CFV      Full           Yes      Yes                                 +---------+---------------+---------+-----------+----------+--------------+ SFJ      Full                                                        +---------+---------------+---------+-----------+----------+--------------+ FV Prox  Full                                                        +---------+---------------+---------+-----------+----------+--------------+  FV Mid   Full                                                        +---------+---------------+---------+-----------+----------+--------------+ FV DistalFull                                                        +---------+---------------+---------+-----------+----------+--------------+ PFV      Full                                                        +---------+---------------+---------+-----------+----------+--------------+ POP      Full           Yes      Yes                                 +---------+---------------+---------+-----------+----------+--------------+ PTV      Full                                                         +---------+---------------+---------+-----------+----------+--------------+ PERO     Full                                                        +---------+---------------+---------+-----------+----------+--------------+     Summary: RIGHT: - There is no evidence of deep vein thrombosis in the lower extremity.  - No cystic structure found in the popliteal fossa.  LEFT: - There is no evidence of deep vein thrombosis in the lower extremity.  - No cystic structure found in the popliteal fossa.  *See table(s) above for measurements and observations. Electronically signed by Sherald Hesshristopher Clark MD on 09/09/2020 at 3:52:49 PM.    Final     Scheduled Meds: . Chlorhexidine Gluconate Cloth  6 each Topical Daily  . heparin injection (subcutaneous)  5,000 Units Subcutaneous Q8H  . insulin aspart  0-5 Units Subcutaneous QHS  . insulin aspart  0-6 Units Subcutaneous TID WC  . insulin aspart  2 Units Subcutaneous TID WC  . insulin glargine  8 Units Subcutaneous Daily  . mouth rinse  15 mL Mouth Rinse BID  . [START ON 09/13/2020] pantoprazole  40 mg Intravenous Q12H   Continuous Infusions: . ondansetron (ZOFRAN) IV 8 mg (09/11/20 [id number])  . pantoprozole (PROTONIX) infusion 8 mg/hr (09/11/20 0632)     LOS: 3 days   Time spent: 35 minutes.  Azucena FallenWilliam C Cain Fitzhenry, DO [facility] [email or web address] 09/11/2020, 6:58 AM

## 2020-09-11 NOTE — Care Plan (Signed)
Referring Provider:  [facility] Primary Care Physician:  Patient, No Pcp Per Primary Gastroenterologist:   Unassigned  Reason for Consultation: Nausea, vomiting, DKA, coffee-ground emesis, COVID-19 infection, abnormal CT scan  HPI: Shelby Brown is a 39 y.o. female  with past medical history of diabetes with noncompliance, recently diagnosed COVID-19 pneumonia admitted  to the hospital with DKA.  Patient was having nausea and vomiting followed by coffee-ground emesis.  CT abdomen pelvis showed thickening of the esophagus could be from esophagitis as well as thickening of the colon which could be from underdistention.   Patient seen and examined at bedside. [employer] interpreter service used for history taking. Patient started having nausea and vomiting few days prior to arrival. Clinical central abdominal discomfort. Denies any diarrhea. Denies any blood in the stool. Last bowel movement prior to hospital admission.  Discussed with RN. Nausea and vomiting is getting better. She is able to tolerate liquid diet at this time.  Past Medical History:  Diagnosis Date  . Diabetes mellitus without complication [facility])     Past Surgical History:  Procedure Laterality Date  . CESAREAN SECTION    . TUBAL LIGATION      Prior to Admission medications   Medication Sig Start Date End Date Taking? Authorizing Provider  blood glucose meter kit and supplies KIT Dispense based on patient and insurance preference. Use up to four times daily as directed. (FOR ICD-9 250.00, 250.01). 09/02/20  Yes Elgergawy, Silver Huguenin, MD  Insulin Syringe-Needle U-100 31G X 15/64" 0.3 ML MISC Use twice daily to administer insulin 09/02/20  Yes Elgergawy, Silver Huguenin, MD  metFORMIN (GLUCOPHAGE) 1000 MG tablet Take 1 tablet (1,000 mg total) by mouth 2 (two) times daily with a meal. 09/02/20  Yes Elgergawy, Silver Huguenin, MD  insulin glargine (LANTUS) 100 UNIT/ML injection Inject 0.08 mLs (8 Units total) into the skin daily. Patient not  taking: Reported on 09/08/2020 09/02/20   Elgergawy, Silver Huguenin, MD    Scheduled Meds: . Chlorhexidine Gluconate Cloth  6 each Topical Daily  . heparin injection (subcutaneous)  5,000 Units Subcutaneous Q8H  . insulin aspart  0-5 Units Subcutaneous QHS  . insulin aspart  0-6 Units Subcutaneous TID WC  . insulin aspart  2 Units Subcutaneous TID WC  . insulin glargine  8 Units Subcutaneous Daily  . mouth rinse  15 mL Mouth Rinse BID  . [START ON 09/13/2020] pantoprazole  40 mg Intravenous Q12H   Continuous Infusions: . sodium chloride 75 mL/hr at 09/11/20 0927  . ondansetron (ZOFRAN) IV Stopped (09/11/20 0650)  . pantoprozole (PROTONIX) infusion 8 mg/hr (09/11/20 1002)  . potassium chloride 10 mEq (09/11/20 1002)   PRN Meds:.dextrose, lip balm, phenol, prochlorperazine  Allergies as of 09/08/2020  . (No Known Allergies)    Family History  Problem Relation Age of Onset  . Cancer Father   . Diabetes Sister   . Diabetes Sister     Social History   Socioeconomic History  . Marital status: Married    Spouse name: Not on file  . Number of children: Not on file  . Years of education: Not on file  . Highest education level: Not on file  Occupational History  . Not on file  Tobacco Use  . Smoking status: Never Smoker  . Smokeless tobacco: Never Used  Substance and Sexual Activity  . Alcohol use: No  . Drug use: No  . Sexual activity: Yes  Other Topics Concern  . Not on file  Social History Narrative  .  Not on file   Social Determinants of Health   Financial Resource Strain: Not on file  Food Insecurity: Not on file  Transportation Needs: Not on file  Physical Activity: Not on file  Stress: Not on file  Social Connections: Not on file  Intimate Partner Violence: Not on file    Review of Systems: All negative except as stated above in HPI.  Physical Exam: Vital signs: Vitals:   09/11/20 0900 09/11/20 1000  BP: (!) 154/95 119/84  Pulse: 87 88  Resp: 14 19   Temp: (!) 100.4 F (38 C) (!) 100.58 F (38.1 C)  SpO2: 97% 97%   Last BM Date:  (PTA) General:   Alert,  Well-developed, well-nourished, pleasant and cooperative in NAD HEENT : Normocephalic, atraumatic, extraocular movement intact Lungs: Anterior exam only. No respiratory distress noted Heart:  Regular rate and rhythm; no murmurs, clicks, rubs,  or gallops. Abdomen: Mild periumbilical discomfort, abdomen is soft, nontender, bowel sounds present.. No peritoneal signs Neuro : Alert/oriented x3 Psych : Mood and affect normal Rectal:  Deferred  GI:  Lab Results: Recent Labs    09/09/20 1005 09/10/20 0502 09/11/20 0216  WBC 15.8* 15.4* 8.8  HGB 12.0 11.5* 11.4*  HCT 36.8 35.0* 34.6*  PLT 529* 486* 468*   BMET Recent Labs    09/09/20 2047 09/10/20 0502 09/11/20 0216  NA 150* 148* 144  K 3.2* 3.3* 3.0*  CL 120* 117* 107  CO2 21* 19* 27  GLUCOSE 168* 172* 231*  BUN _0 CREATININE 0.60 0.58 0.50  CALCIUM 8.6* 8.3* 7.8*   LFT Recent Labs    09/11/20 0216  PROT 6.2*  ALBUMIN 2.6*  AST 13*  ALT 11  ALKPHOS 53  BILITOT 0.4   PT/INR No results for input(s): LABPROT, INR in the last 72 hours.   Studies/Results: CT ANGIO CHEST PE W OR WO CONTRAST  Result Date: 09/09/2020 CLINICAL DATA:  Nausea and vomiting.  COVID positive. EXAM: CT ANGIOGRAPHY CHEST CT ABDOMEN AND PELVIS WITH CONTRAST TECHNIQUE: Multidetector CT imaging of the chest was performed using the standard protocol during bolus administration of intravenous contrast. Multiplanar CT image reconstructions and MIPs were obtained to evaluate the vascular anatomy. Multidetector CT imaging of the abdomen and pelvis was performed using the standard protocol during bolus administration of intravenous contrast. CONTRAST:  30m OMNIPAQUE IOHEXOL 350 MG/ML SOLN COMPARISON:  None. FINDINGS: CTA CHEST FINDINGS Cardiovascular: Contrast injection is sufficient to demonstrate satisfactory opacification of the pulmonary  arteries to the segmental level. There is no pulmonary embolus or evidence of right heart strain. The size of the main pulmonary artery is normal. Heart size is normal, with no pericardial effusion. The course and caliber of the aorta are normal. There is no atherosclerotic calcification. Opacification decreased due to pulmonary arterial phase contrast bolus timing. Mediastinum/Nodes: -- No mediastinal lymphadenopathy. -- No hilar lymphadenopathy. -- No axillary lymphadenopathy. -- No supraclavicular lymphadenopathy. -- Normal thyroid gland where visualized. -is mild diffuse esophageal wall thickening. Lungs/Pleura: Diffuse hazy bilateral ground-glass airspace opacities are noted. There is no pneumothorax or large pleural effusion. The trachea is unremarkable. Musculoskeletal: No chest wall abnormality. No bony spinal canal stenosis. Review of the MIP images confirms the above findings. CT ABDOMEN and PELVIS FINDINGS Hepatobiliary: The liver is normal. Normal gallbladder.There is no biliary ductal dilation. Pancreas: Normal contours without ductal dilatation. No peripancreatic fluid collection. Spleen: Unremarkable. Adrenals/Urinary Tract: --Adrenal glands: Unremarkable. --Right kidney/ureter: No hydronephrosis or radiopaque kidney stones. --Left kidney/ureter: No  hydronephrosis or radiopaque kidney stones. --Urinary bladder: There is a Foley catheter within the urinary bladder. Stomach/Bowel: --Stomach/Duodenum: No hiatal hernia or other gastric abnormality. Normal duodenal course and caliber. --Small bowel: Unremarkable. --Colon: There is diffuse wall thickening of virtually all of the colon. The colon is underdistended. --Appendix: Normal. Vascular/Lymphatic: Normal course and caliber of the major abdominal vessels. --No retroperitoneal lymphadenopathy. --No mesenteric lymphadenopathy. --No pelvic or inguinal lymphadenopathy. Reproductive: Unremarkable Other: No ascites or free air. The abdominal wall is normal.  Musculoskeletal. No acute displaced fractures. Review of the MIP images confirms the above findings. IMPRESSION: 1. No acute pulmonary embolism. 2. Diffuse hazy bilateral ground-glass airspace opacities, consistent with the patient's history of viral pneumonia. 3. Diffuse wall thickening of virtually all of the colon. This may be secondary to underdistention versus colitis. Correlation with the patient's symptoms is recommended. 4. Mild diffuse esophageal wall thickening, suggestive of esophagitis. Electronically Signed   By: Constance Holster M.D.   On: 09/09/2020 17:42   CT ABDOMEN PELVIS W CONTRAST  Result Date: 09/09/2020 CLINICAL DATA:  Nausea and vomiting.  COVID positive. EXAM: CT ANGIOGRAPHY CHEST CT ABDOMEN AND PELVIS WITH CONTRAST TECHNIQUE: Multidetector CT imaging of the chest was performed using the standard protocol during bolus administration of intravenous contrast. Multiplanar CT image reconstructions and MIPs were obtained to evaluate the vascular anatomy. Multidetector CT imaging of the abdomen and pelvis was performed using the standard protocol during bolus administration of intravenous contrast. CONTRAST:  13m OMNIPAQUE IOHEXOL 350 MG/ML SOLN COMPARISON:  None. FINDINGS: CTA CHEST FINDINGS Cardiovascular: Contrast injection is sufficient to demonstrate satisfactory opacification of the pulmonary arteries to the segmental level. There is no pulmonary embolus or evidence of right heart strain. The size of the main pulmonary artery is normal. Heart size is normal, with no pericardial effusion. The course and caliber of the aorta are normal. There is no atherosclerotic calcification. Opacification decreased due to pulmonary arterial phase contrast bolus timing. Mediastinum/Nodes: -- No mediastinal lymphadenopathy. -- No hilar lymphadenopathy. -- No axillary lymphadenopathy. -- No supraclavicular lymphadenopathy. -- Normal thyroid gland where visualized. -is mild diffuse esophageal wall  thickening. Lungs/Pleura: Diffuse hazy bilateral ground-glass airspace opacities are noted. There is no pneumothorax or large pleural effusion. The trachea is unremarkable. Musculoskeletal: No chest wall abnormality. No bony spinal canal stenosis. Review of the MIP images confirms the above findings. CT ABDOMEN and PELVIS FINDINGS Hepatobiliary: The liver is normal. Normal gallbladder.There is no biliary ductal dilation. Pancreas: Normal contours without ductal dilatation. No peripancreatic fluid collection. Spleen: Unremarkable. Adrenals/Urinary Tract: --Adrenal glands: Unremarkable. --Right kidney/ureter: No hydronephrosis or radiopaque kidney stones. --Left kidney/ureter: No hydronephrosis or radiopaque kidney stones. --Urinary bladder: There is a Foley catheter within the urinary bladder. Stomach/Bowel: --Stomach/Duodenum: No hiatal hernia or other gastric abnormality. Normal duodenal course and caliber. --Small bowel: Unremarkable. --Colon: There is diffuse wall thickening of virtually all of the colon. The colon is underdistended. --Appendix: Normal. Vascular/Lymphatic: Normal course and caliber of the major abdominal vessels. --No retroperitoneal lymphadenopathy. --No mesenteric lymphadenopathy. --No pelvic or inguinal lymphadenopathy. Reproductive: Unremarkable Other: No ascites or free air. The abdominal wall is normal. Musculoskeletal. No acute displaced fractures. Review of the MIP images confirms the above findings. IMPRESSION: 1. No acute pulmonary embolism. 2. Diffuse hazy bilateral ground-glass airspace opacities, consistent with the patient's history of viral pneumonia. 3. Diffuse wall thickening of virtually all of the colon. This may be secondary to underdistention versus colitis. Correlation with the patient's symptoms is recommended. 4. Mild diffuse esophageal wall  thickening, suggestive of esophagitis. Electronically Signed   By: Constance Holster M.D.   On: 09/09/2020 17:42   VAS [place] LOWER  EXTREMITY VENOUS (DVT)  Result Date: 09/09/2020  Lower Venous DVT Study Indications: Elevated Ddimer.  Risk Factors: COVID 19 positive. Comparison Study: No prior studies. Performing Technologist: Oliver Hum RVT  Examination Guidelines: A complete evaluation includes B-mode imaging, spectral Doppler, color Doppler, and power Doppler as needed of all accessible portions of each vessel. Bilateral testing is considered an integral part of a complete examination. Limited examinations for reoccurring indications may be performed as noted. The reflux portion of the exam is performed with the patient in reverse Trendelenburg.  +---------+---------------+---------+-----------+----------+--------------+ RIGHT    CompressibilityPhasicitySpontaneityPropertiesThrombus Aging +---------+---------------+---------+-----------+----------+--------------+ CFV      Full           Yes      Yes                                 +---------+---------------+---------+-----------+----------+--------------+ SFJ      Full                                                        +---------+---------------+---------+-----------+----------+--------------+ FV Prox  Full                                                        +---------+---------------+---------+-----------+----------+--------------+ FV Mid   Full                                                        +---------+---------------+---------+-----------+----------+--------------+ FV DistalFull                                                        +---------+---------------+---------+-----------+----------+--------------+ PFV      Full                                                        +---------+---------------+---------+-----------+----------+--------------+ POP      Full           Yes      Yes                                 +---------+---------------+---------+-----------+----------+--------------+ PTV      Full                                                         +---------+---------------+---------+-----------+----------+--------------+  PERO     Full                                                        +---------+---------------+---------+-----------+----------+--------------+   +---------+---------------+---------+-----------+----------+--------------+ LEFT     CompressibilityPhasicitySpontaneityPropertiesThrombus Aging +---------+---------------+---------+-----------+----------+--------------+ CFV      Full           Yes      Yes                                 +---------+---------------+---------+-----------+----------+--------------+ SFJ      Full                                                        +---------+---------------+---------+-----------+----------+--------------+ FV Prox  Full                                                        +---------+---------------+---------+-----------+----------+--------------+ FV Mid   Full                                                        +---------+---------------+---------+-----------+----------+--------------+ FV DistalFull                                                        +---------+---------------+---------+-----------+----------+--------------+ PFV      Full                                                        +---------+---------------+---------+-----------+----------+--------------+ POP      Full           Yes      Yes                                 +---------+---------------+---------+-----------+----------+--------------+ PTV      Full                                                        +---------+---------------+---------+-----------+----------+--------------+ PERO     Full                                                        +---------+---------------+---------+-----------+----------+--------------+  Summary: RIGHT: - There is no evidence of deep vein thrombosis in  the lower extremity.  - No cystic structure found in the popliteal fossa.  LEFT: - There is no evidence of deep vein thrombosis in the lower extremity.  - No cystic structure found in the popliteal fossa.  *See table(s) above for measurements and observations. Electronically signed by Monica Martinez MD on 09/09/2020 at 3:52:49 PM.    Final     Impression/Plan: -Coffee-ground emesis in patient with nausea and vomiting with underlying DKA. Most likely from  Esophagitis. Hemoglobin stable. No blood in the stool. -Nausea and vomiting. Most likely from gastroparesis from uncontrolled diabetes.  pregnancy test negative on September 01, 2020. -Abnormal CT scan showing diffuse colonic wall thickening. Most likely from underdistention. Patient denies any diarrhea. Denies any blood in the stool. She is complaining of constipation with no bowel movement since admission.  Recommendations ------------------------- -Continue Protonix drip with plan of changing to IV twice daily PPI after 72 hours -Start IV Reglan 5 mg three times a day for 3 days -Recommend to control patient's DKA and diabetes. -Do not see any need for urgent endoscopic evaluation. -Recommend follow-up in GI clinic after discharge for further management -GI will sign off. Call us back if any change in clinical status.    LOS: 3 days   Otis Brace  MD, FACP 09/11/2020, 10:43 AM  Contact #  [phone]

## 2020-09-12 DIAGNOSIS — U071 COVID-19: Secondary | ICD-10-CM | POA: Diagnosis not present

## 2020-09-12 DIAGNOSIS — F332 Major depressive disorder, recurrent severe without psychotic features: Secondary | ICD-10-CM | POA: Diagnosis not present

## 2020-09-12 DIAGNOSIS — J181 Lobar pneumonia, unspecified organism: Secondary | ICD-10-CM | POA: Diagnosis not present

## 2020-09-12 DIAGNOSIS — E1110 Type 2 diabetes mellitus with ketoacidosis without coma: Secondary | ICD-10-CM | POA: Diagnosis not present

## 2020-09-12 LAB — CBC
HCT: 33.9 % — ABNORMAL LOW (ref 36.0–46.0)
Hemoglobin: 11.1 g/dL — ABNORMAL LOW (ref 12.0–15.0)
MCH: 29.8 pg (ref 26.0–34.0)
MCHC: 32.7 g/dL (ref 30.0–36.0)
MCV: 90.9 fL (ref 80.0–100.0)
Platelets: 421 10*3/uL — ABNORMAL HIGH (ref 150–400)
RBC: 3.73 MIL/uL — ABNORMAL LOW (ref 3.87–5.11)
RDW: 11.9 % (ref 11.5–15.5)
WBC: 10 10*3/uL (ref 4.0–10.5)
nRBC: 0 % (ref 0.0–0.2)

## 2020-09-12 LAB — BASIC METABOLIC PANEL WITH GFR
Anion gap: 7 (ref 5–15)
BUN: 9 mg/dL (ref 6–20)
CO2: 29 mmol/L (ref 22–32)
Calcium: 7.2 mg/dL — ABNORMAL LOW (ref 8.9–10.3)
Chloride: 106 mmol/L (ref 98–111)
Creatinine, Ser: 0.42 mg/dL — ABNORMAL LOW (ref 0.44–1.00)
GFR, Estimated: >60 mL/min
Glucose, Bld: 193 mg/dL — ABNORMAL HIGH (ref 70–99)
Potassium: 2.8 mmol/L — ABNORMAL LOW (ref 3.5–5.1)
Sodium: 142 mmol/L (ref 135–145)

## 2020-09-12 LAB — GLUCOSE, CAPILLARY
Glucose-Capillary: 206 mg/dL — ABNORMAL HIGH (ref 70–99)
Glucose-Capillary: 117 mg/dL — ABNORMAL HIGH (ref 70–99)
Glucose-Capillary: 265 mg/dL — ABNORMAL HIGH (ref 70–99)
Glucose-Capillary: 92 mg/dL (ref 70–99)

## 2020-09-12 MED ORDER — POTASSIUM CHLORIDE CRYS ER 20 MEQ PO TBCR
40.0000 meq | EXTENDED_RELEASE_TABLET | Freq: Two times a day (BID) | ORAL | Status: AC
  Administered 2020-09-12 (×2): 40 meq via ORAL
  Filled 2020-09-12 (×2): qty 2

## 2020-09-12 MED ORDER — POTASSIUM CHLORIDE 10 MEQ/100ML IV SOLN
10.0000 meq | INTRAVENOUS | Status: DC
  Administered 2020-09-12 (×2): 10 meq via INTRAVENOUS
  Filled 2020-09-12 (×2): qty 100

## 2020-09-12 NOTE — Progress Notes (Signed)
PROGRESS NOTE  Shelby Brown  NID:[id number] DOB: July 26, 1981 DOA: 09/08/2020 PCP: Patient, No Pcp Per   Brief Narrative: Shelby Brown is a 39 y.o. female with a history of poorly controlled IDDM complicated by nonadherence, MDD, and recent admission for DKA with covid-19 infection treated with monoclonal antibody 12/15 prior to discharge on 12/16 who was brought back to the ED 12/22 due to dyspnea, nausea and vomiting in the setting of not taking insulin at home. She was hypothermic, tachycardic, tachypneic without hypoxia, found to be in DKA with venous pH 6.87, bicarbonate <7, anion gap 30. CXR demonstrated new RUL airspace opacity thought to represent superimposed bacterial pneumonia. CRP 3.6, PCT negative. D-dimer >20 and LE U/S negative.    Assessment & Plan: Principal Problem:   Diabetic ketoacidosis associated with type 2 diabetes mellitus (HCC) Active Problems:   MDD (major depressive disorder)   COVID-19 virus infection   DKA, type 2 (HCC)   Lobar pneumonia (HCC)  DKA with poorly-controlled IDT2DM: HbA1c 12.7%. ?If atypical diabetes e.g. MODY, resolved.  - Transition to basal-bolus insulin, off gtt this AM. Continue serial metabolic monitoring.  - Appointment to establish care at [facility] on 09/23/2019 was scheduled prior to recent discharge. Anticipate requiring insulin at discharge.  Hematemesis likely 2/2 esophagitis: Resolved.  In setting of previous vomiting, now with some hematemesis, strong suspicion for MW tear, gastritis also possible.  No hx PUD.  - Avoid NSAIDs - Protonix gtt resumed by GI for 72h total - stop 12/26.  - CT shows esophageal thickening consistent with esophagitis. Also had wall thickening of the entire colon that may be due to underdistention. The patient is not having diarrhea. No other emergent findings on CT.  - Discussed with GI, Dr. Levora Angel 12/23, 12/24 who appropriately recommends against endoscopy at this time.  Esophageal and colonic thickening not specific findings. - Advance clear liquid diet as tolerated - still requiring multiple doses of anti-emetics daily to control symptoms  Hyperkalemia: Resolved Hypokalemia ongoing Hypokalemia after improvement in glucose level. Will continue to supplement in IV fluids given esophagitis/vomiting - Continue to monitor - supplemented today  Hypernatremia: Improving.  - IVF ongoing given poor PO intake - will advance diet today in hopes of increasing patient's intake.  AKI, resolved:  - Qualifies with Cr on arrival of 1.12, coming down to 0.58 with IVF.   Acute urinary retention:  - Possibly related to autonomic neuropathy.  - Foley catheter removed - follow urinary output  Covid-19 infection:  Dx 12/15,  S/p bamlanivimab/etesevimab 12/15.  - No longer likely to benefit from remdesivir and will avoid steroids given DKA above.  - Admissions have been due to DKA, not necessarily/directly covid-19. With new infiltrate, it remains possible that symptoms could worsen from here (admitted 1 week after Dx). Remain on isolation.   Sepsis due to superimposed bacterial RUL pneumonia, RULED OUT: Working diagnosis at admission, felt to be ruled out. Leukocytosis improving. PCT negative x2. CTA chest reviewed showing no specific consolidations. Remains afebrile. - Stop abx and monitor.    Elevated d-dimer: Venous U/S LE's negative, no PE on CTA.  - Hgb stable, so will start prophylactic heparin and monitor closely.  - Likely elevated in the setting of covid - monitor closely for thrombotic events  MDD:  - Resume home medications once taking po.  DVT prophylaxis: Heparin 5k units q8h Code Status: Full Family Communication: Daughter by phone today. Disposition Plan:  Status is: Inpatient  Remains inpatient appropriate because:Persistent severe electrolyte  disturbances, Altered mental status, IV treatments appropriate due to intensity of illness or inability to  take PO and Inpatient level of care appropriate due to severity of illness  Dispo: The patient is from: Home              Anticipated d/c is to: Home              Anticipated d/c date is: 48-72h              Patient currently is not medically stable to d/c.  Consultants:   None  Procedures:   None  Antimicrobials:  Levaquin stopped  Subjective: No acute issues/events overnight. Ongoing nausea without overt vomiting at bedside this am. Late afternoon episodes of worsening emesis - improved with treatment.  Objective: Vitals:   09/12/20 0300 09/12/20 0400 09/12/20 0500 09/12/20 0600  BP: 134/80 129/75 132/74 128/79  Pulse: 85 88 82 81  Resp: 11 12 18 19   Temp: (!) 100.4 F (38 C) 100.22 F (37.9 C) 100.22 F (37.9 C) 100.04 F (37.8 C)  TempSrc:      SpO2: 98% 96% 97% 97%  Weight:      Height:        Intake/Output Summary (Last 24 hours) at 09/12/2020 0725 Last data filed at 09/12/2020 09/14/2020 Gross per 24 hour  Intake 2394.89 ml  Output 775 ml  Net 1619.89 ml   Filed Weights   09/08/20 1620 09/08/20 2300  Weight: 52 kg 45 kg   Gen: Tired-appearing female in no distress Pulm: Nonlabored breathing room air. Clear. CV: Regular rate and rhythm. No murmur, rub, or gallop. No JVD, no dependent edema. GI: Abdomen soft, uncomfortable but without focal tenderness, non-distended, with normoactive bowel sounds.  Ext: Warm, no deformities Skin: No rashes, lesions or ulcers on visualized skin. Neuro: Alert and oriented. No focal neurological deficits.  Data Reviewed: I have personally reviewed following labs and imaging studies  CBC: Recent Labs  Lab 09/08/20 1704 09/08/20 1729 09/08/20 2237 09/09/20 1005 09/10/20 0502 09/11/20 0216 09/12/20 0215  WBC 19.1*  --  19.8* 15.8* 15.4* 8.8 10.0  NEUTROABS 17.0*  --   --   --  13.6*  --   --   HGB 13.0   < > 11.9* 12.0 11.5* 11.4* 11.1*  HCT 44.3   < > 37.7 36.8 35.0* 34.6* 33.9*  MCV 100.7*  --  93.8 91.1 89.5 89.9  90.9  PLT 582*  --  526* 529* 486* 468* 421*   < > = values in this interval not displayed.   Basic Metabolic Panel: Recent Labs  Lab 09/09/20 1259 09/09/20 2047 09/10/20 0502 09/11/20 0216 09/12/20 0215  NA 151* 150* 148* 144 142  K 2.9* 3.2* 3.3* 3.0* 2.8*  CL 121* 120* 117* 107 106  CO2 21* 21* 19* 27 29  GLUCOSE 172* 168* 172* 231* 193*  BUN 15 13 9 6 9   CREATININE 0.62 0.60 0.58 0.50 0.42*  CALCIUM 8.9 8.6* 8.3* 7.8* 7.2*   GFR: Estimated Creatinine Clearance: 66.4 mL/min (A) (by C-G formula based on SCr of 0.42 mg/dL (L)). Liver Function Tests: Recent Labs  Lab 09/08/20 1704 09/11/20 0216  AST 17 13*  ALT 15 11  ALKPHOS 72 53  BILITOT 1.5* 0.4  PROT 8.5* 6.2*  ALBUMIN 3.5 2.6*   CBG: Recent Labs  Lab 09/10/20 2127 09/11/20 0801 09/11/20 1129 09/11/20 1637 09/11/20 2120  GLUCAP 240* 307* 200* 118* 201*   Lipid Profile: No results for  input(s): CHOL, HDL, LDLCALC, TRIG, CHOLHDL, LDLDIRECT in the last 72 hours. Thyroid Function Tests: No results for input(s): TSH, T4TOTAL, FREET4, T3FREE, THYROIDAB in the last 72 hours. Anemia Panel: No results for input(s): VITAMINB12, FOLATE, FERRITIN, TIBC, IRON, RETICCTPCT in the last 72 hours. Urine analysis:    Component Value Date/Time   COLORURINE STRAW (A) 09/08/2020 2024   APPEARANCEUR CLEAR 09/08/2020 2024   LABSPEC 1.018 09/08/2020 2024   PHURINE 5.0 09/08/2020 2024   GLUCOSEU >=500 (A) 09/08/2020 2024   GLUCOSEU > 1000 mg/dL (A) 72/53/6644 0347   HGBUR LARGE (A) 09/08/2020 2024   BILIRUBINUR NEGATIVE 09/08/2020 2024   BILIRUBINUR small (A) 08/06/2017 1044   KETONESUR 80 (A) 09/08/2020 2024   PROTEINUR 100 (A) 09/08/2020 2024   UROBILINOGEN 0.2 08/06/2017 1044   UROBILINOGEN 0.2 11/28/2014 1725   NITRITE NEGATIVE 09/08/2020 2024   LEUKOCYTESUR NEGATIVE 09/08/2020 2024   Recent Results (from the past 240 hour(s))  MRSA PCR Screening     Status: None   Collection Time: 09/10/20 10:05 AM   Specimen:  Nasal Mucosa; Nasopharyngeal  Result Value Ref Range Status   MRSA by PCR NEGATIVE NEGATIVE Final    Comment:        The GeneXpert MRSA Assay (FDA approved for NASAL specimens only), is one component of a comprehensive MRSA colonization surveillance program. It is not intended to diagnose MRSA infection nor to guide or monitor treatment for MRSA infections. Performed at [facility], [address] [place], [place] [address]       Radiology Studies: No results found.  Scheduled Meds: . Chlorhexidine Gluconate Cloth  6 each Topical Daily  . heparin injection (subcutaneous)  5,000 Units Subcutaneous Q8H  . insulin aspart  0-5 Units Subcutaneous QHS  . insulin aspart  0-6 Units Subcutaneous TID WC  . insulin aspart  2 Units Subcutaneous TID WC  . insulin glargine  10 Units Subcutaneous Daily  . mouth rinse  15 mL Mouth Rinse BID  . metoCLOPramide (REGLAN) injection  5 mg Intravenous Q8H  . [START ON 09/13/2020] pantoprazole  40 mg Intravenous Q12H   Continuous Infusions: . sodium chloride 75 mL/hr at 09/12/20 0653  . pantoprozole (PROTONIX) infusion 8 mg/hr (09/12/20 0653)     LOS: 4 days   Time spent: 35 minutes.  Azucena Fallen, DO [facility] [email or web address] 09/12/2020, 7:25 AM

## 2020-09-12 NOTE — Consult Note (Signed)
-   Please see care plan note dated 09/11/2020.   Kathi Der MD, FACP 09/12/2020, 10:08 AM  Contact #  [phone]

## 2020-09-13 ENCOUNTER — Other Ambulatory Visit (HOSPITAL_COMMUNITY): Payer: Self-pay | Admitting: Internal Medicine

## 2020-09-13 DIAGNOSIS — E1110 Type 2 diabetes mellitus with ketoacidosis without coma: Secondary | ICD-10-CM | POA: Diagnosis not present

## 2020-09-13 DIAGNOSIS — U071 COVID-19: Secondary | ICD-10-CM | POA: Diagnosis not present

## 2020-09-13 LAB — CBC
HCT: 33.6 % — ABNORMAL LOW (ref 36.0–46.0)
Hemoglobin: 10.9 g/dL — ABNORMAL LOW (ref 12.0–15.0)
MCH: 29.4 pg (ref 26.0–34.0)
MCHC: 32.4 g/dL (ref 30.0–36.0)
MCV: 90.6 fL (ref 80.0–100.0)
Platelets: 375 10*3/uL (ref 150–400)
RBC: 3.71 MIL/uL — ABNORMAL LOW (ref 3.87–5.11)
RDW: 11.7 % (ref 11.5–15.5)
WBC: 12.6 10*3/uL — ABNORMAL HIGH (ref 4.0–10.5)
nRBC: 0 % (ref 0.0–0.2)

## 2020-09-13 LAB — GLUCOSE, CAPILLARY
Glucose-Capillary: 158 mg/dL — ABNORMAL HIGH (ref 70–99)
Glucose-Capillary: 87 mg/dL (ref 70–99)
Glucose-Capillary: 108 mg/dL — ABNORMAL HIGH (ref 70–99)

## 2020-09-13 LAB — BASIC METABOLIC PANEL
Anion gap: 8 (ref 5–15)
BUN: <5 mg/dL — ABNORMAL LOW (ref 6–20)
CO2: 29 mmol/L (ref 22–32)
Calcium: 7.5 mg/dL — ABNORMAL LOW (ref 8.9–10.3)
Chloride: 103 mmol/L (ref 98–111)
Creatinine, Ser: 0.44 mg/dL (ref 0.44–1.00)
GFR, Estimated: >60 mL/min (ref >60)
Glucose, Bld: 188 mg/dL — ABNORMAL HIGH (ref 70–99)
Potassium: 2.9 mmol/L — ABNORMAL LOW (ref 3.5–5.1)
Sodium: 140 mmol/L (ref 135–145)

## 2020-09-13 MED ORDER — BLOOD GLUCOSE MONITOR KIT: PACK | Status: DC

## 2020-09-13 MED ORDER — ONDANSETRON HCL 4 MG PO TABS: 4.0000 mg | ORAL_TABLET | Freq: Every day | ORAL | Status: DC | PRN

## 2020-09-13 MED ORDER — PROMETHAZINE HCL 6.25 MG/5ML PO SYRP: 6.2500 mg | ORAL_SOLUTION | Freq: Four times a day (QID) | ORAL | Status: DC | PRN

## 2020-09-13 MED ORDER — METFORMIN HCL 1000 MG PO TABS: 1000.0000 mg | ORAL_TABLET | Freq: Two times a day (BID) | ORAL | 1 refills | Status: DC

## 2020-09-13 MED ORDER — INSULIN GLARGINE 100 UNIT/ML SOLOSTAR PEN: 10.0000 [IU] | PEN_INJECTOR | Freq: Every day | SUBCUTANEOUS | 1 refills | Status: DC

## 2020-09-13 MED ORDER — POTASSIUM CHLORIDE CRYS ER 20 MEQ PO TBCR
40.0000 meq | EXTENDED_RELEASE_TABLET | Freq: Three times a day (TID) | ORAL | Status: DC
  Administered 2020-09-13 (×2): 40 meq via ORAL
  Filled 2020-09-13 (×2): qty 2

## 2020-09-13 MED ORDER — INSULIN SYRINGES (DISPOSABLE) U-100 0.5 ML MISC: 1.0000 | Freq: Two times a day (BID) | 1 refills | Status: DC

## 2020-09-13 MED ORDER — PROMETHAZINE HCL 6.25 MG/5ML PO SYRP: 6.2500 mg | ORAL_SOLUTION | Freq: Four times a day (QID) | ORAL | 1 refills | Status: DC | PRN

## 2020-09-13 MED ORDER — PEN NEEDLES 32G X 4 MM MISC: 1.0000 | Freq: Every day | Status: DC

## 2020-09-13 MED ORDER — BLOOD GLUCOSE MONITOR KIT: PACK | 0 refills | Status: DC

## 2020-09-13 MED ORDER — NOVOLIN 70/30 RELION (70-30) 100 UNIT/ML ~~LOC~~ SUSP: 8.0000 [IU] | Freq: Two times a day (BID) | SUBCUTANEOUS | Status: DC

## 2020-09-13 MED ORDER — ONDANSETRON HCL 4 MG PO TABS: 4.0000 mg | ORAL_TABLET | Freq: Every day | ORAL | 1 refills | Status: DC | PRN

## 2020-09-13 MED ORDER — PROMETHAZINE HCL 6.25 MG/5ML PO SYRP
6.2500 mg | ORAL_SOLUTION | Freq: Four times a day (QID) | ORAL | 1 refills | Status: DC | PRN
Start: 2020-09-13 — End: 2020-09-13

## 2020-09-13 MED ORDER — INSULIN SYRINGES (DISPOSABLE) U-100 0.5 ML MISC: 1.0000 | Freq: Two times a day (BID) | Status: DC

## 2020-09-13 MED ORDER — NOVOLIN 70/30 RELION (70-30) 100 UNIT/ML ~~LOC~~ SUSP: 8.0000 [IU] | Freq: Two times a day (BID) | SUBCUTANEOUS | 1 refills | Status: DC

## 2020-09-13 MED FILL — ONDANSETRON HCL 4 MG TABLET: 4 | 30 days supply | Qty: 30 | Fill #0

## 2020-09-13 MED FILL — ULTICARE INS 0.5 ML 30GX1/2: 30G X 1/2" | 50 days supply | Qty: 100 | Fill #0

## 2020-09-13 MED FILL — NOVOLIN 70/30 100 UNITS/ML: (70-30) 100 | 42 days supply | Qty: 10 | Fill #0

## 2020-09-13 MED FILL — PROMETHAZINE 6.25 MG/5 ML S: 6.25 | 6 days supply | Qty: 120 | Fill #0

## 2020-09-13 NOTE — TOC Transition Note (Signed)
Transition of Care [facility]) - CM/SW Discharge Note   Patient Details  Name: Shelby Brown MRN: [id number] Date of Birth: 08-12-1981  [facility] CM/SW Contact:  Darleene Cleaver, LCSW Phone Number: 09/13/2020, 3:03 PM   Clinical Narrative:     RN case manager spoke to patient's daughter Shelby Brown [phone].  Patient does not have insurance, patient's daughter agreed to go to the [facility] tomorrow in order get patient's insulin and other medications.  CSW notified physician and bedside nurse to send patient's meds to the [facility] and [facility].  CSW to contact [facility] to inform them that patient will need a one time fill for insulin.  CSW also included contact information for [facility] for patient to get established and make an appointment.  Daughter made aware to make an appointment, fill out application for financial assistance and she was informed to pick up the application.  CSW signing off, please reconsult for other social work needs.  Final next level of care: Home/Self Care Barriers to Discharge: Inadequate or no insurance   Patient Goals and CMS Choice Patient states their goals for this hospitalization and ongoing recovery are:: To return back home CMS Medicare.gov Compare Post Acute Care list provided to:: Patient Represenative (must comment) Choice offered to / list presented to : Adult Children  Discharge Placement                       Discharge Plan and Services                                     Social Determinants of Health (SDOH) Interventions     Readmission Risk Interventions No flowsheet data found.

## 2020-09-13 NOTE — Progress Notes (Signed)
Inpatient Diabetes Program Recommendations  AACE/ADA: New Consensus Statement on Inpatient Glycemic Control (2015)  Target Ranges:  Prepandial:   less than 140 mg/dL      Peak postprandial:   less than 180 mg/dL (1-2 hours)      Critically ill patients:  140 - 180 mg/dL   Lab Results  Component Value Date   GLUCAP 158 (H) 09/13/2020   HGBA1C 12.7 (H) 09/01/2020    Review of Glycemic Control  Diabetes history: DM, hx of GDM Outpatient Diabetes medications: metformin 1000 mg bid, Lantus 8 units QD, Novolog 0-15 units tidwc Current orders for Inpatient glycemic control: Lantus 10 QD, Novolog 0-6 units tid and 0-5 units QHS + 2 units tidwc for meal coverage.  HgbA1C - 12.7% 188, 158 mg/dL this am.  Inpatient Diabetes Program Recommendations:     Ordered TOC consult for PCP/medication needs. Hopefully will be able to get insulin from [facility] pharmacy. If not, will need affordable insulin such as ReliOn Novolin 70/30.  Spoke with daughter this am and she said pt has meter and supplies at home. Discussed importance of encouraging her mother to check blood sugars at least 3x/day and take insulin as prescribed. Will need to f/u with PCP.   RN to allow pt to give her own insulin injections and stick finger for CBG checks while inpatient.  Will speak to pt with interpreter after lunch today.   Pt still on CL diet, will need to advance to CHO mod med when able to tolerate.  Thank you. Ailene Ards, RD, LDN, CDE Inpatient Diabetes Coordinator [phone]

## 2020-09-13 NOTE — Plan of Care (Signed)
  Problem: Education: Goal: Knowledge of risk factors and measures for prevention of condition will improve 09/13/2020 1556 by Sandre Kitty, RN Outcome: Progressing 09/13/2020 1300 by Sandre Kitty, RN Outcome: Progressing   Problem: Coping: Goal: Psychosocial and spiritual needs will be supported 09/13/2020 1556 by Sandre Kitty, RN Outcome: Progressing 09/13/2020 1300 by Sandre Kitty, RN Outcome: Progressing   Problem: Respiratory: Goal: Will maintain a patent airway Outcome: Progressing Goal: Complications related to the disease process, condition or treatment will be avoided or minimized Outcome: Progressing   Problem: Education: Goal: Knowledge of General Education information will improve Description: Including pain rating scale, medication(s)/side effects and non-pharmacologic comfort measures 09/13/2020 1556 by Sandre Kitty, RN Outcome: Progressing 09/13/2020 1300 by Sandre Kitty, RN Outcome: Progressing

## 2020-09-13 NOTE — Discharge Summary (Addendum)
Physician Discharge Summary  Shelby Brown VWU:[id number] DOB: 1981-03-05 DOA: 09/08/2020  PCP: Patient, No Pcp Per  Admit date: 09/08/2020 Discharge date: 09/13/2020  Admitted From: Home Disposition: Home  Recommendations for Outpatient Follow-up:  1. Follow up with PCP in 1-2 weeks 2. Please obtain BMP/CBC in one week 3. Please follow up on potassium level  Home Health: None Equipment/Devices: None  Discharge Condition: Stable CODE STATUS: Full Diet recommendation: Diabetic diet  Brief/Interim Summary: Shelby Brown is a 39 y.o. female with a history of poorly controlled IDDM complicated by nonadherence, MDD, and recent admission for DKA with covid-19 infection treated with monoclonal antibody 12/15 prior to discharge on 12/16 who was brought back to the ED 12/22 due to dyspnea, nausea and vomiting in the setting of not taking insulin at home. She was hypothermic, tachycardic, tachypneic without hypoxia, found to be in DKA with venous pH 6.87, bicarbonate <7, anion gap 30. CXR demonstrated new RUL airspace opacity thought to represent superimposed bacterial pneumonia. CRP 3.6, PCT negative. D-dimer >20 and LE U/S negative.  Patient admitted as above with acute DKA in the setting of uncontrolled diabetes with A1c of 12.7.  Patient's DKA resolved quite quickly however patient continued to have symptoms of nausea vomiting and poor p.o. intake with notable hematemesis and esophagitis on imaging.  Patient also noted to have recent COVID-19 diagnosis which likely is playing a role in her poor p.o. intake and nausea vomiting.  At this time patient is now able to tolerate p.o without ongoing issues of intractable nausea or vomiting but does still have occasional episodes of nausea.  Will discharge home with increased insulin 70/30 10 units daily as well as lengthy discussion about proper diet.  Patient's GNFAO-13 appears to be improving, patient was not initiated on steroids due to  hyperglycemia as above.  At this time she is otherwise stable and agreeable for discharge, her intractable nausea vomiting, hematemesis and respiratory status have all resolved and currently back to baseline.  Close follow-up with PCP at the [facility] in the next 1 to 2 weeks for follow-up of labs, most notably hypokalemia in the setting of poor p.o. intake which continues to improve with supplementation.  Discharge Diagnoses:  Principal Problem:   Diabetic ketoacidosis associated with type 2 diabetes mellitus ([place]) Active Problems:   MDD (major depressive disorder)   COVID-19 virus infection   DKA, type 2 ([place])   Lobar pneumonia [facility])  Discharge Instructions  Discharge Instructions    Call MD for:  extreme fatigue   Complete by: As directed    Call MD for:  persistant dizziness or light-headedness   Complete by: As directed    Call MD for:  persistant nausea and vomiting   Complete by: As directed    Diet Carb Modified   Complete by: As directed    Increase activity slowly   Complete by: As directed      Allergies as of 09/13/2020   No Known Allergies     Medication List    STOP taking these medications   insulin glargine 100 UNIT/ML injection Commonly known as: LANTUS   Insulin Syringe-Needle U-100 31G X 15/64" 0.3 ML Misc     TAKE these medications   blood glucose meter kit and supplies Kit Dispense based on patient and insurance preference. Use up to four times daily as directed. (FOR ICD-9 250.00, 250.01).   Insulin Syringes (Disposable) U-100 0.5 ML Misc 1 Package by Does not apply route in the morning and  at bedtime.   metFORMIN 1000 MG tablet Commonly known as: GLUCOPHAGE Take 1 tablet (1,000 mg total) by mouth 2 (two) times daily with a meal.   NovoLIN 70/30 ReliOn (70-30) 100 UNIT/ML injection Generic drug: insulin NPH-regular Human Inject 8 Units into the skin 2 (two) times daily with a meal.   ondansetron 4 MG tablet Commonly known as: Zofran Take 1  tablet (4 mg total) by mouth daily as needed for nausea or vomiting.   promethazine 6.25 MG/5ML syrup Commonly known as: PHENERGAN Take 5 mLs (6.25 mg total) by mouth 4 (four) times daily as needed for nausea or vomiting.       Follow-up Information    [facility]. Schedule an appointment as soon as possible for a visit in 1 week(s).   Why: [place] [facility] to get established for PCP. Contact information: [place] [phone]             No Known Allergies  Consultations:  GI   Procedures/Studies: CT ANGIO CHEST PE W OR WO CONTRAST  Result Date: 09/09/2020 CLINICAL DATA:  Nausea and vomiting.  COVID positive. EXAM: CT ANGIOGRAPHY CHEST CT ABDOMEN AND PELVIS WITH CONTRAST TECHNIQUE: Multidetector CT imaging of the chest was performed using the standard protocol during bolus administration of intravenous contrast. Multiplanar CT image reconstructions and MIPs were obtained to evaluate the vascular anatomy. Multidetector CT imaging of the abdomen and pelvis was performed using the standard protocol during bolus administration of intravenous contrast. CONTRAST:  42m OMNIPAQUE IOHEXOL 350 MG/ML SOLN COMPARISON:  None. FINDINGS: CTA CHEST FINDINGS Cardiovascular: Contrast injection is sufficient to demonstrate satisfactory opacification of the pulmonary arteries to the segmental level. There is no pulmonary embolus or evidence of right heart strain. The size of the main pulmonary artery is normal. Heart size is normal, with no pericardial effusion. The course and caliber of the aorta are normal. There is no atherosclerotic calcification. Opacification decreased due to pulmonary arterial phase contrast bolus timing. Mediastinum/Nodes: -- No mediastinal lymphadenopathy. -- No hilar lymphadenopathy. -- No axillary lymphadenopathy. -- No supraclavicular lymphadenopathy. -- Normal  thyroid gland where visualized. -is mild diffuse esophageal wall thickening. Lungs/Pleura: Diffuse hazy bilateral ground-glass airspace opacities are noted. There is no pneumothorax or large pleural effusion. The trachea is unremarkable. Musculoskeletal: No chest wall abnormality. No bony spinal canal stenosis. Review of the MIP images confirms the above findings. CT ABDOMEN and PELVIS FINDINGS Hepatobiliary: The liver is normal. Normal gallbladder.There is no biliary ductal dilation. Pancreas: Normal contours without ductal dilatation. No peripancreatic fluid collection. Spleen: Unremarkable. Adrenals/Urinary Tract: --Adrenal glands: Unremarkable. --Right kidney/ureter: No hydronephrosis or radiopaque kidney stones. --Left kidney/ureter: No hydronephrosis or radiopaque kidney stones. --Urinary bladder: There is a Foley catheter within the urinary bladder. Stomach/Bowel: --Stomach/Duodenum: No hiatal hernia or other gastric abnormality. Normal duodenal course and caliber. --Small bowel: Unremarkable. --Colon: There is diffuse wall thickening of virtually all of the colon. The colon is underdistended. --Appendix: Normal. Vascular/Lymphatic: Normal course and caliber of the major abdominal vessels. --No retroperitoneal lymphadenopathy. --No mesenteric lymphadenopathy. --No pelvic or inguinal lymphadenopathy. Reproductive: Unremarkable Other: No ascites or free air. The abdominal wall is normal. Musculoskeletal. No acute displaced fractures. Review of the MIP images confirms the above findings. IMPRESSION: 1. No acute pulmonary embolism. 2. Diffuse hazy bilateral ground-glass airspace opacities, consistent with the patient's history of viral pneumonia. 3. Diffuse wall thickening of virtually all of the colon. This may  be secondary to underdistention versus colitis. Correlation with the patient's symptoms is recommended. 4. Mild diffuse esophageal wall thickening, suggestive of esophagitis. Electronically Signed   By:  Constance Holster M.D.   On: 09/09/2020 17:42   CT ABDOMEN PELVIS W CONTRAST  Result Date: 09/09/2020 CLINICAL DATA:  Nausea and vomiting.  COVID positive. EXAM: CT ANGIOGRAPHY CHEST CT ABDOMEN AND PELVIS WITH CONTRAST TECHNIQUE: Multidetector CT imaging of the chest was performed using the standard protocol during bolus administration of intravenous contrast. Multiplanar CT image reconstructions and MIPs were obtained to evaluate the vascular anatomy. Multidetector CT imaging of the abdomen and pelvis was performed using the standard protocol during bolus administration of intravenous contrast. CONTRAST:  71m OMNIPAQUE IOHEXOL 350 MG/ML SOLN COMPARISON:  None. FINDINGS: CTA CHEST FINDINGS Cardiovascular: Contrast injection is sufficient to demonstrate satisfactory opacification of the pulmonary arteries to the segmental level. There is no pulmonary embolus or evidence of right heart strain. The size of the main pulmonary artery is normal. Heart size is normal, with no pericardial effusion. The course and caliber of the aorta are normal. There is no atherosclerotic calcification. Opacification decreased due to pulmonary arterial phase contrast bolus timing. Mediastinum/Nodes: -- No mediastinal lymphadenopathy. -- No hilar lymphadenopathy. -- No axillary lymphadenopathy. -- No supraclavicular lymphadenopathy. -- Normal thyroid gland where visualized. -is mild diffuse esophageal wall thickening. Lungs/Pleura: Diffuse hazy bilateral ground-glass airspace opacities are noted. There is no pneumothorax or large pleural effusion. The trachea is unremarkable. Musculoskeletal: No chest wall abnormality. No bony spinal canal stenosis. Review of the MIP images confirms the above findings. CT ABDOMEN and PELVIS FINDINGS Hepatobiliary: The liver is normal. Normal gallbladder.There is no biliary ductal dilation. Pancreas: Normal contours without ductal dilatation. No peripancreatic fluid collection. Spleen: Unremarkable.  Adrenals/Urinary Tract: --Adrenal glands: Unremarkable. --Right kidney/ureter: No hydronephrosis or radiopaque kidney stones. --Left kidney/ureter: No hydronephrosis or radiopaque kidney stones. --Urinary bladder: There is a Foley catheter within the urinary bladder. Stomach/Bowel: --Stomach/Duodenum: No hiatal hernia or other gastric abnormality. Normal duodenal course and caliber. --Small bowel: Unremarkable. --Colon: There is diffuse wall thickening of virtually all of the colon. The colon is underdistended. --Appendix: Normal. Vascular/Lymphatic: Normal course and caliber of the major abdominal vessels. --No retroperitoneal lymphadenopathy. --No mesenteric lymphadenopathy. --No pelvic or inguinal lymphadenopathy. Reproductive: Unremarkable Other: No ascites or free air. The abdominal wall is normal. Musculoskeletal. No acute displaced fractures. Review of the MIP images confirms the above findings. IMPRESSION: 1. No acute pulmonary embolism. 2. Diffuse hazy bilateral ground-glass airspace opacities, consistent with the patient's history of viral pneumonia. 3. Diffuse wall thickening of virtually all of the colon. This may be secondary to underdistention versus colitis. Correlation with the patient's symptoms is recommended. 4. Mild diffuse esophageal wall thickening, suggestive of esophagitis. Electronically Signed   By: CConstance HolsterM.D.   On: 09/09/2020 17:42   DG Chest Port 1 View  Result Date: 09/08/2020 CLINICAL DATA:  Shortness of breath. Insulin-dependent diabetes. COVID-19 virus infection. EXAM: PORTABLE CHEST 1 VIEW COMPARISON:  09/01/2020 FINDINGS: The heart size and mediastinal contours are within normal limits. New patchy airspace opacity is seen in the inferior aspect of the right upper lobe, suspicious for pneumonia. Left lung is clear. No evidence of pleural effusion. IMPRESSION: New right upper lobe airspace opacity, suspicious for pneumonia. Continued radiographic follow-up is  recommended to confirm resolution. Electronically Signed   By: JMarlaine HindM.D.   On: 09/08/2020 18:18   DG Chest Port 1 View  Result Date: 09/01/2020 CLINICAL DATA:  COVID. Body aches. EXAM: PORTABLE CHEST 1 VIEW COMPARISON:  None. FINDINGS: Lung volumes are low.The cardiomediastinal contours are normal. Vague opacity at the left lung base. Pulmonary vasculature is normal. No pleural effusion or pneumothorax. No acute osseous abnormalities are seen. IMPRESSION: Low lung volumes. Vague opacity at the left lung base, may represent atelectasis or COVID pneumonia. Electronically Signed   By: Keith Rake M.D.   On: 09/01/2020 18:06   VAS [place] LOWER EXTREMITY VENOUS (DVT)  Result Date: 09/09/2020  Lower Venous DVT Study Indications: Elevated Ddimer.  Risk Factors: COVID 19 positive. Comparison Study: No prior studies. Performing Technologist: Oliver Hum RVT  Examination Guidelines: A complete evaluation includes B-mode imaging, spectral Doppler, color Doppler, and power Doppler as needed of all accessible portions of each vessel. Bilateral testing is considered an integral part of a complete examination. Limited examinations for reoccurring indications may be performed as noted. The reflux portion of the exam is performed with the patient in reverse Trendelenburg.  +---------+---------------+---------+-----------+----------+--------------+ RIGHT    CompressibilityPhasicitySpontaneityPropertiesThrombus Aging +---------+---------------+---------+-----------+----------+--------------+ CFV      Full           Yes      Yes                                 +---------+---------------+---------+-----------+----------+--------------+ SFJ      Full                                                        +---------+---------------+---------+-----------+----------+--------------+ FV Prox  Full                                                         +---------+---------------+---------+-----------+----------+--------------+ FV Mid   Full                                                        +---------+---------------+---------+-----------+----------+--------------+ FV DistalFull                                                        +---------+---------------+---------+-----------+----------+--------------+ PFV      Full                                                        +---------+---------------+---------+-----------+----------+--------------+ POP      Full           Yes      Yes                                 +---------+---------------+---------+-----------+----------+--------------+ PTV  Full                                                        +---------+---------------+---------+-----------+----------+--------------+ PERO     Full                                                        +---------+---------------+---------+-----------+----------+--------------+   +---------+---------------+---------+-----------+----------+--------------+ LEFT     CompressibilityPhasicitySpontaneityPropertiesThrombus Aging +---------+---------------+---------+-----------+----------+--------------+ CFV      Full           Yes      Yes                                 +---------+---------------+---------+-----------+----------+--------------+ SFJ      Full                                                        +---------+---------------+---------+-----------+----------+--------------+ FV Prox  Full                                                        +---------+---------------+---------+-----------+----------+--------------+ FV Mid   Full                                                        +---------+---------------+---------+-----------+----------+--------------+ FV DistalFull                                                         +---------+---------------+---------+-----------+----------+--------------+ PFV      Full                                                        +---------+---------------+---------+-----------+----------+--------------+ POP      Full           Yes      Yes                                 +---------+---------------+---------+-----------+----------+--------------+ PTV      Full                                                        +---------+---------------+---------+-----------+----------+--------------+  PERO     Full                                                        +---------+---------------+---------+-----------+----------+--------------+     Summary: RIGHT: - There is no evidence of deep vein thrombosis in the lower extremity.  - No cystic structure found in the popliteal fossa.  LEFT: - There is no evidence of deep vein thrombosis in the lower extremity.  - No cystic structure found in the popliteal fossa.  *See table(s) above for measurements and observations. Electronically signed by Monica Martinez MD on 09/09/2020 at 3:52:49 PM.    Final      Subjective: No acute issues or events overnight, nausea improving but not yet resolved, no further episodes of vomiting, denies hematemesis, headache, fever, chills, diarrhea, constipation.   Discharge Exam: Vitals:   09/13/20 0453 09/13/20 1512  BP: 124/75 120/74  Pulse: 81 79  Resp: 14 16  Temp: 99.6 F (37.6 C) 99.7 F (37.6 C)  SpO2: 99%    Vitals:   09/12/20 1849 09/12/20 2133 09/13/20 0453 09/13/20 1512  BP: 124/77 123/73 124/75 120/74  Pulse: 85 81 81 79  Resp: _0 Temp: 99.2 F (37.3 C) 99.3 F (37.4 C) 99.6 F (37.6 C) 99.7 F (37.6 C)  TempSrc: Oral Oral Oral Oral  SpO2: 100% 98% 99%   Weight:      Height:        General: Pt is alert, awake, not in acute distress Cardiovascular: RRR, S1/S2 +, no rubs, no gallops Respiratory: CTA bilaterally, no wheezing, no rhonchi Abdominal:  Soft, NT, ND, bowel sounds + Extremities: no edema, no cyanosis    The results of significant diagnostics from this hospitalization (including imaging, microbiology, ancillary and laboratory) are listed below for reference.     Microbiology: Recent Results (from the past 240 hour(s))  MRSA PCR Screening     Status: None   Collection Time: 09/10/20 10:05 AM   Specimen: Nasal Mucosa; Nasopharyngeal  Result Value Ref Range Status   MRSA by PCR NEGATIVE NEGATIVE Final    Comment:        The GeneXpert MRSA Assay (FDA approved for NASAL specimens only), is one component of a comprehensive MRSA colonization surveillance program. It is not intended to diagnose MRSA infection nor to guide or monitor treatment for MRSA infections. Performed at [facility], [place] [address], [place], [place] [address]      Labs: BNP (last 3 results) No results for input(s): BNP in the last 8760 hours. Basic Metabolic Panel: Recent Labs  Lab 09/09/20 2047 09/10/20 0502 09/11/20 0216 09/12/20 0215 09/13/20 0306  NA 150* 148* 144 142 140  K 3.2* 3.3* 3.0* 2.8* 2.9*  CL 120* 117* 107 106 103  CO2 21* 19* _1 GLUCOSE 168* 172* 231* 193* 188*  BUN _2 <5*  CREATININE 0.60 0.58 0.50 0.42* 0.44  CALCIUM 8.6* 8.3* 7.8* 7.2* 7.5*   Liver Function Tests: Recent Labs  Lab 09/08/20 1704 09/11/20 0216  AST 17 13*  ALT 15 11  ALKPHOS 72 53  BILITOT 1.5* 0.4  PROT 8.5* 6.2*  ALBUMIN 3.5 2.6*   No results for input(s): LIPASE, AMYLASE in the last 168 hours. No results for input(s): AMMONIA in  the last 168 hours. CBC: Recent Labs  Lab 09/08/20 1704 09/08/20 1729 09/09/20 1005 09/10/20 0502 09/11/20 0216 09/12/20 0215 09/13/20 0306  WBC 19.1*   < > 15.8* 15.4* 8.8 10.0 12.6*  NEUTROABS 17.0*  --   --  13.6*  --   --   --   HGB 13.0   < > 12.0 11.5* 11.4* 11.1* 10.9*  HCT 44.3   < > 36.8 35.0* 34.6* 33.9* 33.6*  MCV 100.7*   < > 91.1 89.5 89.9 90.9 90.6  PLT  582*   < > 529* 486* 468* 421* 375   < > = values in this interval not displayed.   Cardiac Enzymes: No results for input(s): CKTOTAL, CKMB, CKMBINDEX, TROPONINI in the last 168 hours. BNP: Invalid input(s): POCBNP CBG: Recent Labs  Lab 09/12/20 1204 09/12/20 1654 09/12/20 2135 09/13/20 0825 09/13/20 1223  GLUCAP 117* 265* 92 158* 87   D-Dimer Recent Labs    09/11/20 0216  DDIMER 0.81*   Hgb A1c No results for input(s): HGBA1C in the last 72 hours. Lipid Profile No results for input(s): CHOL, HDL, LDLCALC, TRIG, CHOLHDL, LDLDIRECT in the last 72 hours. Thyroid function studies No results for input(s): TSH, T4TOTAL, T3FREE, THYROIDAB in the last 72 hours.  Invalid input(s): FREET3 Anemia work up No results for input(s): VITAMINB12, FOLATE, FERRITIN, TIBC, IRON, RETICCTPCT in the last 72 hours. Urinalysis    Component Value Date/Time   COLORURINE STRAW (A) 09/08/2020 2024   APPEARANCEUR CLEAR 09/08/2020 2024   LABSPEC 1.018 09/08/2020 2024   PHURINE 5.0 09/08/2020 2024   GLUCOSEU >=500 (A) 09/08/2020 2024   GLUCOSEU > 1000 mg/dL (A) 12/10/2007 2144   HGBUR LARGE (A) 09/08/2020 2024   BILIRUBINUR NEGATIVE 09/08/2020 2024   BILIRUBINUR small (A) 08/06/2017 1044   KETONESUR 80 (A) 09/08/2020 2024   PROTEINUR 100 (A) 09/08/2020 2024   UROBILINOGEN 0.2 08/06/2017 1044   UROBILINOGEN 0.2 11/28/2014 1725   NITRITE NEGATIVE 09/08/2020 2024   LEUKOCYTESUR NEGATIVE 09/08/2020 2024   Sepsis Labs Invalid input(s): PROCALCITONIN,  WBC,  LACTICIDVEN Microbiology Recent Results (from the past 240 hour(s))  MRSA PCR Screening     Status: None   Collection Time: 09/10/20 10:05 AM   Specimen: Nasal Mucosa; Nasopharyngeal  Result Value Ref Range Status   MRSA by PCR NEGATIVE NEGATIVE Final    Comment:        The GeneXpert MRSA Assay (FDA approved for NASAL specimens only), is one component of a comprehensive MRSA colonization surveillance program. It is not intended to  diagnose MRSA infection nor to guide or monitor treatment for MRSA infections. Performed at [facility], [place] [address], [place], [place] [address]      Time coordinating discharge: Over 30 minutes  SIGNED:   Little Ishikawa, DO [facility] 09/13/2020, 3:54 PM Pager   If 7PM-7AM, please contact night-coverage [email or web address]

## 2020-09-13 NOTE — Progress Notes (Signed)
Patient will be discharged.  Telemetry will be discontinued and peripheral Ivs along with Telemetry Box will be discontinued.  Will use Research officer, [employer].

## 2020-09-13 NOTE — Progress Notes (Signed)
Patient has been discharged.  Awaiting ride from Daughter in private vehicle to transport home.  Discharge instructions given and daughter Grover Canavan on the phone.  No questions asked; patient said she had no questions.  Will continue to monitor.

## 2020-09-13 NOTE — Progress Notes (Signed)
Inpatient Diabetes Program Recommendations  AACE/ADA: New Consensus Statement on Inpatient Glycemic Control (2015)  Target Ranges:  Prepandial:   less than 140 mg/dL      Peak postprandial:   less than 180 mg/dL (1-2 hours)      Critically ill patients:  140 - 180 mg/dL   Lab Results  Component Value Date   GLUCAP 87 09/13/2020   HGBA1C 12.7 (H) 09/01/2020    Review of Glycemic Control  Used interpreter to speak with pt in room. Ordered consult for TOC since pt did not have PCP and needs assistance with meds. TOC Minerva Areola said [facility] pharmacy is closed today and pt could not get meds until 12/28. Pt states she had GDM and injected insulin via syringe. RN allowed pt to give her own insulin today and stick finger for CBG checks. I spoke with MD regarding changing her insulin to Novolin ReliOn 70/30 8 units bid. Pt states she can afford $25 for insulin at [facility]. Stressed importance of taking her insulin as prescribed and checking blood sugars at least 3x/day. Instructed pt to take logbook to PCP appt for review. Explained about HgbA1C and 12.7% is average blood sugar of 318 mg/dL. Goal is 7% to reduce risk of complications from uncontrolled DM. Also discussed hypoglycemia s/s and treatment. Answered questions and pt states she feels comfortable about going home on insulin. Will call [facility] on 12/28 to make appt for PCP.   Inpatient Diabetes Program Recommendations:     70/30 8 units bid.  Pt has meter at home.  Thank you. Ailene Ards, RD, LDN, CDE Inpatient Diabetes Coordinator [phone]

## 2020-09-13 NOTE — Plan of Care (Signed)
  Problem: Education: Goal: Knowledge of risk factors and measures for prevention of condition will improve Outcome: Progressing   Problem: Coping: Goal: Psychosocial and spiritual needs will be supported Outcome: Progressing   Problem: Education: Goal: Knowledge of General Education information will improve Description: Including pain rating scale, medication(s)/side effects and non-pharmacologic comfort measures Outcome: Progressing   

## 2020-09-13 NOTE — Progress Notes (Signed)
Patient performs Blood sugars checks at home and was able to perform her blood sugar check here at the hospital.  Also, patient administered her own insulin without difficulty.  Teach back method was effective.  Patient has no questions at this time.  Update given to patient's daughter([name]) regarding patient's status and patient's plan for discharge home.  Will continue to monitor.

## 2020-09-22 ENCOUNTER — Telehealth (INDEPENDENT_AMBULATORY_CARE_PROVIDER_SITE_OTHER): Payer: Self-pay | Admitting: Primary Care

## 2020-11-16 ENCOUNTER — Other Ambulatory Visit: Payer: Self-pay

## 2020-11-16 ENCOUNTER — Emergency Department (HOSPITAL_COMMUNITY): Payer: Self-pay

## 2020-11-16 ENCOUNTER — Inpatient Hospital Stay (HOSPITAL_COMMUNITY)
Admission: EM | Admit: 2020-11-16 | Discharge: 2020-11-19 | DRG: 690 | Disposition: A | Discharge status: Home / Self Care | Payer: Self-pay | Attending: Family Medicine | Admitting: Family Medicine

## 2020-11-16 ENCOUNTER — Encounter (HOSPITAL_COMMUNITY): Payer: Self-pay

## 2020-11-16 DIAGNOSIS — N10 Acute tubulo-interstitial nephritis: Principal | ICD-10-CM | POA: Diagnosis present

## 2020-11-16 DIAGNOSIS — Z833 Family history of diabetes mellitus: Secondary | ICD-10-CM

## 2020-11-16 DIAGNOSIS — K921 Melena: Secondary | ICD-10-CM | POA: Diagnosis present

## 2020-11-16 DIAGNOSIS — D509 Iron deficiency anemia, unspecified: Secondary | ICD-10-CM | POA: Diagnosis present

## 2020-11-16 DIAGNOSIS — N12 Tubulo-interstitial nephritis, not specified as acute or chronic: Secondary | ICD-10-CM

## 2020-11-16 DIAGNOSIS — E1165 Type 2 diabetes mellitus with hyperglycemia: Secondary | ICD-10-CM | POA: Diagnosis present

## 2020-11-16 DIAGNOSIS — E119 Type 2 diabetes mellitus without complications: Secondary | ICD-10-CM | POA: Diagnosis present

## 2020-11-16 DIAGNOSIS — IMO0002 Reserved for concepts with insufficient information to code with codable children: Secondary | ICD-10-CM | POA: Diagnosis present

## 2020-11-16 DIAGNOSIS — Z8616 Personal history of COVID-19: Secondary | ICD-10-CM

## 2020-11-16 DIAGNOSIS — D649 Anemia, unspecified: Secondary | ICD-10-CM

## 2020-11-16 DIAGNOSIS — Z20822 Contact with and (suspected) exposure to covid-19: Secondary | ICD-10-CM | POA: Diagnosis present

## 2020-11-16 DIAGNOSIS — Z794 Long term (current) use of insulin: Secondary | ICD-10-CM

## 2020-11-16 DIAGNOSIS — Z681 Body mass index (BMI) 19 or less, adult: Secondary | ICD-10-CM

## 2020-11-16 DIAGNOSIS — E440 Moderate protein-calorie malnutrition: Secondary | ICD-10-CM | POA: Diagnosis present

## 2020-11-16 DIAGNOSIS — Z7984 Long term (current) use of oral hypoglycemic drugs: Secondary | ICD-10-CM

## 2020-11-16 HISTORY — DX: Tubulo-interstitial nephritis, not specified as acute or chronic: N12

## 2020-11-16 LAB — CBC WITH DIFFERENTIAL/PLATELET
Abs Immature Granulocytes: 0.05 10*3/uL (ref 0.00–0.07)
Basophils Absolute: 0.1 10*3/uL (ref 0.0–0.1)
Basophils Relative: 1 %
Eosinophils Absolute: 0 10*3/uL (ref 0.0–0.5)
Eosinophils Relative: 0 %
HCT: 28.3 % — ABNORMAL LOW (ref 36.0–46.0)
Hemoglobin: 8.9 g/dL — ABNORMAL LOW (ref 12.0–15.0)
Immature Granulocytes: 1 %
Lymphocytes Relative: 18 %
Lymphs Abs: 1.9 10*3/uL (ref 0.7–4.0)
MCH: 26.6 pg (ref 26.0–34.0)
MCHC: 31.4 g/dL (ref 30.0–36.0)
MCV: 84.7 fL (ref 80.0–100.0)
Monocytes Absolute: 0.7 10*3/uL (ref 0.1–1.0)
Monocytes Relative: 7 %
Neutro Abs: 7.8 10*3/uL — ABNORMAL HIGH (ref 1.7–7.7)
Neutrophils Relative %: 73 %
Platelets: 647 10*3/uL — ABNORMAL HIGH (ref 150–400)
RBC: 3.34 MIL/uL — ABNORMAL LOW (ref 3.87–5.11)
RDW: 13.9 % (ref 11.5–15.5)
WBC: 10.6 10*3/uL — ABNORMAL HIGH (ref 4.0–10.5)
nRBC: 0 % (ref 0.0–0.2)

## 2020-11-16 LAB — URINALYSIS, ROUTINE W REFLEX MICROSCOPIC
Bilirubin Urine: NEGATIVE
Glucose, UA: >=500 mg/dL — AB
Ketones, ur: 20 mg/dL — AB
Nitrite: NEGATIVE
Protein, ur: NEGATIVE mg/dL
Specific Gravity, Urine: 1.025 (ref 1.005–1.030)
WBC, UA: >50 WBC/hpf — ABNORMAL HIGH (ref 0–5)
pH: 5 (ref 5.0–8.0)

## 2020-11-16 LAB — LIPASE, BLOOD: Lipase: 27 U/L (ref 11–51)

## 2020-11-16 LAB — COMPREHENSIVE METABOLIC PANEL
ALT: 9 U/L (ref 0–44)
AST: 11 U/L — ABNORMAL LOW (ref 15–41)
Albumin: 3.1 g/dL — ABNORMAL LOW (ref 3.5–5.0)
Alkaline Phosphatase: 82 U/L (ref 38–126)
Anion gap: 9 (ref 5–15)
BUN: 17 mg/dL (ref 6–20)
CO2: 27 mmol/L (ref 22–32)
Calcium: 8.9 mg/dL (ref 8.9–10.3)
Chloride: 95 mmol/L — ABNORMAL LOW (ref 98–111)
Creatinine, Ser: 0.69 mg/dL (ref 0.44–1.00)
GFR, Estimated: >60 mL/min (ref >60)
Glucose, Bld: 379 mg/dL — ABNORMAL HIGH (ref 70–99)
Potassium: 5.2 mmol/L — ABNORMAL HIGH (ref 3.5–5.1)
Sodium: 131 mmol/L — ABNORMAL LOW (ref 135–145)
Total Bilirubin: 0.8 mg/dL (ref 0.3–1.2)
Total Protein: 7.9 g/dL (ref 6.5–8.1)

## 2020-11-16 LAB — BLOOD GAS, VENOUS
Acid-Base Excess: 3.2 mmol/L — ABNORMAL HIGH (ref 0.0–2.0)
Bicarbonate: 28.7 mmol/L — ABNORMAL HIGH (ref 20.0–28.0)
O2 Saturation: 36.2 %
Patient temperature: 98.6
pCO2, Ven: 51.3 mmHg (ref 44.0–60.0)
pH, Ven: 7.366 (ref 7.250–7.430)
pO2, Ven: 25.6 mmHg — CL (ref 32.0–45.0)

## 2020-11-16 LAB — I-STAT BETA HCG BLOOD, ED (MC, WL, AP ONLY): I-stat hCG, quantitative: <5 m[IU]/mL (ref <5)

## 2020-11-16 MED ORDER — PANTOPRAZOLE SODIUM 40 MG IV SOLR
40.0000 mg | Freq: Two times a day (BID) | INTRAVENOUS | Status: DC
  Administered 2020-11-16: 40 mg via INTRAVENOUS
  Filled 2020-11-16: qty 40

## 2020-11-16 MED ORDER — SODIUM CHLORIDE 0.9 % IV SOLN
1.0000 g | Freq: Once | INTRAVENOUS | Status: AC
  Administered 2020-11-16: 1 g via INTRAVENOUS
  Filled 2020-11-16: qty 10

## 2020-11-16 MED ORDER — IOHEXOL 300 MG/ML  SOLN
100.0000 mL | Freq: Once | INTRAMUSCULAR | Status: AC | PRN
  Administered 2020-11-16: 100 mL via INTRAVENOUS

## 2020-11-16 MED ORDER — LACTATED RINGERS IV BOLUS
1000.0000 mL | Freq: Once | INTRAVENOUS | Status: AC
  Administered 2020-11-16: 1000 mL via INTRAVENOUS

## 2020-11-16 NOTE — ED Notes (Signed)
EDP notified of VBG result O2 level 25.6

## 2020-11-16 NOTE — ED Triage Notes (Signed)
Pt presents from UC after being told to come here for multiple complaints. Papers reports that pt needs to come here asap to rule out sepsis. Papers and pt also report that her abdominal aorta is larger and they are concerned.

## 2020-11-16 NOTE — ED Provider Notes (Signed)
[facility] DEPT Provider Note   CSN: [id number] Arrival date & time: 11/16/20  1829     History Chief Complaint  Patient presents with  . Abdominal Pain    Shelby Brown is a 40 y.o. female.   Abdominal Pain Pain location:  Generalized Pain quality: aching   Pain radiates to:  Does not radiate Pain severity:  Moderate Onset quality:  Gradual Timing:  Intermittent Progression:  Waxing and waning Relieved by:  Nothing Worsened by:  Nothing Ineffective treatments:  None tried Associated symptoms: anorexia and nausea   Associated symptoms: no chest pain, no chills, no cough, no diarrhea, no dysuria, no fever, no shortness of breath and no vomiting        Past Medical History:  Diagnosis Date  . Diabetes mellitus without complication [facility])     Patient Active Problem List   Diagnosis Date Noted  . DKA, type 2 ([place]) 09/08/2020  . Lobar pneumonia ([place]) 09/08/2020  . Diabetic ketoacidosis associated with type 2 diabetes mellitus ([place]) 09/01/2020  . COVID-19 virus infection 09/01/2020  . Abdominal pain, chronic, epigastric 03/12/2018  . MDD (major depressive disorder) 11/01/2017  . Intentional metformin overdose ([place]) 10/27/2017  . Adjustment disorder with mixed anxiety and depressed mood 10/27/2017  . DYSLIPIDEMIA 12/10/2007  . Diabetes type 2, uncontrolled ([place]) 08/07/2006    Past Surgical History:  Procedure Laterality Date  . CESAREAN SECTION    . TUBAL LIGATION       OB History   No obstetric history on file.     Family History  Problem Relation Age of Onset  . Cancer Father   . Diabetes Sister   . Diabetes Sister     Social History   Tobacco Use  . Smoking status: Never Smoker  . Smokeless tobacco: Never Used  Substance Use Topics  . Alcohol use: No  . Drug use: No    Home Medications Prior to Admission medications   Medication Sig Start Date End Date Taking? Authorizing Provider  blood glucose meter  kit and supplies KIT Dispense based on patient and insurance preference. Use up to four times daily as directed. (FOR ICD-9 250.00, 250.01). 09/13/20  Yes Little Ishikawa, MD  insulin NPH-regular Human (NOVOLIN 70/30 RELION) (70-30) 100 UNIT/ML injection Inject 8 Units into the skin 2 (two) times daily with a meal. 09/13/20  Yes Little Ishikawa, MD  Insulin Syringes, Disposable, U-100 0.5 ML MISC 1 Package by Does not apply route in the morning and at bedtime. 09/13/20  Yes Little Ishikawa, MD  metFORMIN (GLUCOPHAGE) 1000 MG tablet Take 1 tablet (1,000 mg total) by mouth 2 (two) times daily with a meal. 09/13/20  Yes Little Ishikawa, MD  ondansetron (ZOFRAN) 4 MG tablet Take 1 tablet (4 mg total) by mouth daily as needed for nausea or vomiting. Patient not taking: Reported on 11/16/2020 09/13/20 09/13/21  Little Ishikawa, MD  promethazine (PHENERGAN) 6.25 MG/5ML syrup Take 5 mLs (6.25 mg total) by mouth 4 (four) times daily as needed for nausea or vomiting. Patient not taking: Reported on 11/16/2020 09/13/20 09/13/21  Little Ishikawa, MD    Allergies    Patient has no known allergies.  Review of Systems   Review of Systems  Constitutional: Negative for chills and fever.  HENT: Negative for congestion and rhinorrhea.   Respiratory: Negative for cough and shortness of breath.   Cardiovascular: Negative for chest pain and palpitations.  Gastrointestinal: Positive for abdominal pain, anorexia  and nausea. Negative for diarrhea and vomiting.  Genitourinary: Negative for difficulty urinating and dysuria.  Musculoskeletal: Negative for arthralgias and back pain.  Skin: Negative for rash and wound.  Neurological: Negative for light-headedness and headaches.    Physical Exam Updated Vital Signs BP 133/83 (BP Location: Right Arm)   Pulse 88   Temp 98.4 F (36.9 C) (Oral)   Resp 17   Ht $R'5\' 3"'OD$  (1.6 m)   Wt 45.4 kg   SpO2 100%   BMI 17.71 kg/m   Physical  Exam Vitals and nursing note reviewed. Exam conducted with a chaperone present.  Constitutional:      General: She is not in acute distress.    Appearance: Normal appearance.  HENT:     Head: Normocephalic and atraumatic.     Nose: No rhinorrhea.  Eyes:     General:        Right eye: No discharge.        Left eye: No discharge.     Conjunctiva/sclera: Conjunctivae normal.  Cardiovascular:     Rate and Rhythm: Normal rate and regular rhythm.  Pulmonary:     Effort: Pulmonary effort is normal. No respiratory distress.     Breath sounds: No stridor.  Abdominal:     General: Abdomen is flat. There is no distension.     Palpations: Abdomen is soft.     Tenderness: There is generalized abdominal tenderness. There is no guarding. Negative signs include Murphy's sign, Rovsing's sign and McBurney's sign.  Musculoskeletal:        General: No tenderness or signs of injury.  Skin:    General: Skin is warm and dry.  Neurological:     General: No focal deficit present.     Mental Status: She is alert. Mental status is at baseline.     Motor: No weakness.  Psychiatric:        Mood and Affect: Mood normal.        Behavior: Behavior normal.     ED Results / Procedures / Treatments   Labs (all labs ordered are listed, but only abnormal results are displayed) Labs Reviewed  CBC WITH DIFFERENTIAL/PLATELET - Abnormal; Notable for the following components:      Result Value   WBC 10.6 (*)    RBC 3.34 (*)    Hemoglobin 8.9 (*)    HCT 28.3 (*)    Platelets 647 (*)    Neutro Abs 7.8 (*)    All other components within normal limits  COMPREHENSIVE METABOLIC PANEL - Abnormal; Notable for the following components:   Sodium 131 (*)    Potassium 5.2 (*)    Chloride 95 (*)    Glucose, Bld 379 (*)    Albumin 3.1 (*)    AST 11 (*)    All other components within normal limits  URINALYSIS, ROUTINE W REFLEX MICROSCOPIC - Abnormal; Notable for the following components:   APPearance HAZY (*)     Glucose, UA >=500 (*)    Hgb urine dipstick MODERATE (*)    Ketones, ur 20 (*)    Leukocytes,Ua MODERATE (*)    WBC, UA >50 (*)    Bacteria, UA MANY (*)    All other components within normal limits  BLOOD GAS, VENOUS - Abnormal; Notable for the following components:   pO2, Ven 25.6 (*)    Bicarbonate 28.7 (*)    Acid-Base Excess 3.2 (*)    All other components within normal limits  URINE CULTURE  LIPASE, BLOOD  I-STAT BETA HCG BLOOD, ED ([facility], [facility], [facility] ONLY)    EKG None  Radiology CT ABDOMEN PELVIS W CONTRAST  Result Date: 11/16/2020 CLINICAL DATA:  Lower abdominal pain, vomiting EXAM: CT ABDOMEN AND PELVIS WITH CONTRAST TECHNIQUE: Multidetector CT imaging of the abdomen and pelvis was performed using the standard protocol following bolus administration of intravenous contrast. CONTRAST:  OMNIPAQUE IOHEXOL 300 MG/ML  SOLN COMPARISON:  09/09/2020 FINDINGS: Lower chest: Trace bilateral pleural effusions. No acute airspace disease. Hepatobiliary: No focal liver abnormality is seen. No gallstones, gallbladder wall thickening, or biliary dilatation. Pancreas: Unremarkable. No pancreatic ductal dilatation or surrounding inflammatory changes. Spleen: Normal in size without focal abnormality. Adrenals/Urinary Tract: The adrenals are unremarkable. The right kidney enhances normally. The left kidney is enlarged and edematous, with heterogeneous decreased enhancement consistent with pyelonephritis. The bladder is minimally distended with no focal abnormalities. Stomach/Bowel: No bowel obstruction or ileus. Normal gas-filled appendix right lower quadrant. No bowel wall thickening or inflammatory change. Vascular/Lymphatic: No significant vascular findings are present. Borderline enlarged left para-aortic lymph nodes are likely reactive, measuring up to 8 mm in short axis. No pathologic adenopathy. Reproductive: Uterus and bilateral adnexa are unremarkable. Other: No free fluid or free gas.  No abdominal  wall hernia. Musculoskeletal: No acute or destructive bony lesions. Reconstructed images demonstrate no additional findings. IMPRESSION: 1. Left pyelonephritis.  No evidence of renal abscess. 2. Normal appendix. 3. Trace bilateral pleural effusions. Electronically Signed   By: Sharlet Salina M.D.   On: 11/16/2020 21:32    Procedures Procedures   Medications Ordered in ED Medications  cefTRIAXone (ROCEPHIN) 1 g in sodium chloride 0.9 % 100 mL IVPB (1 g Intravenous New Bag/Given 11/16/20 2144)  lactated ringers bolus 1,000 mL (0 mLs Intravenous Stopped 11/16/20 2140)  iohexol (OMNIPAQUE) 300 MG/ML solution 100 mL (100 mLs Intravenous Contrast Given 11/16/20 2110)    ED Course  I have reviewed the triage vital signs and the nursing notes.  Pertinent labs & imaging results that were available during my care of the patient were reviewed by me and considered in my medical decision making (see chart for details).    MDM Rules/Calculators/A&P                          Patient with history of insulin-dependent diabetes comes in with continued abdominal pain.  She had this pain since admission for COVID 2 months ago.  Today she has no focal signs of tenderness however she says she is having difficulty keeping down foods.  She comments on having an abnormality to her aorta.  Review of the vascular scan shows no abnormalities of the aorta.  We will get CT imaging today to see if there is abnormality.  Also get screening labs for metabolic disturbance.  Patient denies any nausea right now we will give IV fluids.  Denies any pain.  No signs of DKA.  Laboratory studies show hyperglycemia with normal gap.  Urinalysis concerns for infection and CT scan consistent with pyelonephritis.  Rocephin given culture sent.  I also noticed the patient had a drop in hemoglobin.  The patient will likely need admission for observation as she comments on having loose black stools over the last few weeks.  May have ulcerative  disease that may need further evaluation.  Will consult medicine for admission.  The patient will be admitted to the hospitalist.  For the remainder this patient's care please see inpatient team  notes.  I will intervene as needed while the patient remains in the emergency department.  Final Clinical Impression(s) / ED Diagnoses Final diagnoses:  Pyelonephritis  Anemia, unspecified type    Rx / DC Orders ED Discharge Orders    None       Breck Coons, MD 11/16/20 2206

## 2020-11-16 NOTE — ED Notes (Signed)
Nurse unavailable for report will call back in 15 minutes.

## 2020-11-16 NOTE — ED Notes (Signed)
Report called and given to nurse.  

## 2020-11-17 DIAGNOSIS — D649 Anemia, unspecified: Secondary | ICD-10-CM

## 2020-11-17 DIAGNOSIS — K921 Melena: Secondary | ICD-10-CM

## 2020-11-17 HISTORY — DX: Melena: K92.1

## 2020-11-17 LAB — CBC
HCT: 25.9 % — ABNORMAL LOW (ref 36.0–46.0)
HCT: 26.9 % — ABNORMAL LOW (ref 36.0–46.0)
Hemoglobin: 8.1 g/dL — ABNORMAL LOW (ref 12.0–15.0)
Hemoglobin: 8.2 g/dL — ABNORMAL LOW (ref 12.0–15.0)
MCH: 26.5 pg (ref 26.0–34.0)
MCH: 26.2 pg (ref 26.0–34.0)
MCHC: 31.3 g/dL (ref 30.0–36.0)
MCHC: 30.5 g/dL (ref 30.0–36.0)
MCV: 84.6 fL (ref 80.0–100.0)
MCV: 85.9 fL (ref 80.0–100.0)
Platelets: 548 10*3/uL — ABNORMAL HIGH (ref 150–400)
Platelets: 583 10*3/uL — ABNORMAL HIGH (ref 150–400)
RBC: 3.06 MIL/uL — ABNORMAL LOW (ref 3.87–5.11)
RBC: 3.13 MIL/uL — ABNORMAL LOW (ref 3.87–5.11)
RDW: 13.8 % (ref 11.5–15.5)
RDW: 14.1 % (ref 11.5–15.5)
WBC: 10.4 10*3/uL (ref 4.0–10.5)
WBC: 9.3 10*3/uL (ref 4.0–10.5)
nRBC: 0 % (ref 0.0–0.2)
nRBC: 0 % (ref 0.0–0.2)

## 2020-11-17 LAB — RETICULOCYTES
Immature Retic Fract: 17 % — ABNORMAL HIGH (ref 2.3–15.9)
RBC.: 3.06 MIL/uL — ABNORMAL LOW (ref 3.87–5.11)
Retic Count, Absolute: 39.2 10*3/uL (ref 19.0–186.0)
Retic Ct Pct: 1.3 % (ref 0.4–3.1)

## 2020-11-17 LAB — BASIC METABOLIC PANEL
Anion gap: 9 (ref 5–15)
BUN: 15 mg/dL (ref 6–20)
CO2: 24 mmol/L (ref 22–32)
Calcium: 8.1 mg/dL — ABNORMAL LOW (ref 8.9–10.3)
Chloride: 100 mmol/L (ref 98–111)
Creatinine, Ser: 0.58 mg/dL (ref 0.44–1.00)
GFR, Estimated: >60 mL/min (ref >60)
Glucose, Bld: 244 mg/dL — ABNORMAL HIGH (ref 70–99)
Potassium: 4.3 mmol/L (ref 3.5–5.1)
Sodium: 133 mmol/L — ABNORMAL LOW (ref 135–145)

## 2020-11-17 LAB — IRON AND TIBC
Iron: 21 ug/dL — ABNORMAL LOW (ref 28–170)
Saturation Ratios: 9 % — ABNORMAL LOW (ref 10.4–31.8)
TIBC: 237 ug/dL — ABNORMAL LOW (ref 250–450)
UIBC: 216 ug/dL

## 2020-11-17 LAB — FOLATE: Folate: 11.9 ng/mL (ref >5.9)

## 2020-11-17 LAB — GLUCOSE, CAPILLARY
Glucose-Capillary: 258 mg/dL — ABNORMAL HIGH (ref 70–99)
Glucose-Capillary: 135 mg/dL — ABNORMAL HIGH (ref 70–99)
Glucose-Capillary: 284 mg/dL — ABNORMAL HIGH (ref 70–99)
Glucose-Capillary: 251 mg/dL — ABNORMAL HIGH (ref 70–99)
Glucose-Capillary: 190 mg/dL — ABNORMAL HIGH (ref 70–99)

## 2020-11-17 LAB — RESP PANEL BY RT-PCR (FLU A&B, COVID) ARPGX2
Influenza A by PCR: NEGATIVE
Influenza B by PCR: NEGATIVE
SARS Coronavirus 2 by RT PCR: NEGATIVE

## 2020-11-17 LAB — HEMOGLOBIN A1C
Hgb A1c MFr Bld: 11.8 % — ABNORMAL HIGH (ref 4.8–5.6)
Mean Plasma Glucose: 291.96 mg/dL

## 2020-11-17 LAB — VITAMIN B12: Vitamin B-12: 6062 pg/mL — ABNORMAL HIGH (ref 180–914)

## 2020-11-17 LAB — FERRITIN: Ferritin: 61 ng/mL (ref 11–307)

## 2020-11-17 MED ORDER — HYDROMORPHONE HCL 1 MG/ML IJ SOLN
0.5000 mg | INTRAMUSCULAR | Status: DC | PRN
  Administered 2020-11-17: 1 mg via INTRAVENOUS
  Filled 2020-11-17: qty 1

## 2020-11-17 MED ORDER — INSULIN GLARGINE 100 UNIT/ML ~~LOC~~ SOLN
10.0000 [IU] | Freq: Every day | SUBCUTANEOUS | Status: DC
  Administered 2020-11-17 – 2020-11-18 (×2): 10 [IU] via SUBCUTANEOUS
  Filled 2020-11-17 (×5): qty 0.1

## 2020-11-17 MED ORDER — HYDROCODONE-ACETAMINOPHEN 5-325 MG PO TABS
1.0000 | ORAL_TABLET | ORAL | Status: DC | PRN
  Administered 2020-11-18: 1 via ORAL
  Administered 2020-11-19: 2 via ORAL
  Filled 2020-11-17: qty 2
  Filled 2020-11-17: qty 1

## 2020-11-17 MED ORDER — SODIUM CHLORIDE 0.9 % IV SOLN: INTRAVENOUS | Status: AC

## 2020-11-17 MED ORDER — ACETAMINOPHEN 325 MG PO TABS: 650.0000 mg | ORAL_TABLET | Freq: Four times a day (QID) | ORAL | Status: DC | PRN

## 2020-11-17 MED ORDER — INSULIN ASPART 100 UNIT/ML ~~LOC~~ SOLN
0.0000 [IU] | SUBCUTANEOUS | Status: DC
  Administered 2020-11-17: 5 [IU] via SUBCUTANEOUS
  Administered 2020-11-17: 2 [IU] via SUBCUTANEOUS
  Administered 2020-11-17: 5 [IU] via SUBCUTANEOUS
  Administered 2020-11-17: 1 [IU] via SUBCUTANEOUS
  Administered 2020-11-17: 5 [IU] via SUBCUTANEOUS
  Administered 2020-11-18: 3 [IU] via SUBCUTANEOUS
  Administered 2020-11-18: 2 [IU] via SUBCUTANEOUS
  Administered 2020-11-18 (×2): 5 [IU] via SUBCUTANEOUS
  Administered 2020-11-18: 2 [IU] via SUBCUTANEOUS
  Administered 2020-11-19: 1 [IU] via SUBCUTANEOUS
  Administered 2020-11-19 (×2): 2 [IU] via SUBCUTANEOUS

## 2020-11-17 MED ORDER — SODIUM CHLORIDE 0.9 % IV SOLN
1.0000 g | INTRAVENOUS | Status: DC
  Administered 2020-11-17 – 2020-11-18 (×2): 1 g via INTRAVENOUS
  Filled 2020-11-17 (×2): qty 1

## 2020-11-17 MED ORDER — ENSURE ENLIVE PO LIQD
237.0000 mL | Freq: Two times a day (BID) | ORAL | Status: DC
  Administered 2020-11-17 – 2020-11-18 (×2): 237 mL via ORAL

## 2020-11-17 MED ORDER — HYDROMORPHONE HCL 1 MG/ML IJ SOLN
1.0000 mg | INTRAMUSCULAR | Status: DC | PRN
  Administered 2020-11-17: 1 mg via INTRAVENOUS
  Filled 2020-11-17: qty 1

## 2020-11-17 MED ORDER — ONDANSETRON HCL 4 MG/2ML IJ SOLN
4.0000 mg | Freq: Four times a day (QID) | INTRAMUSCULAR | Status: DC | PRN
  Administered 2020-11-17 – 2020-11-19 (×3): 4 mg via INTRAVENOUS
  Filled 2020-11-17 (×3): qty 2

## 2020-11-17 MED ORDER — PANTOPRAZOLE SODIUM 40 MG IV SOLR
40.0000 mg | INTRAVENOUS | Status: DC
  Administered 2020-11-17 – 2020-11-18 (×2): 40 mg via INTRAVENOUS
  Filled 2020-11-17 (×2): qty 40

## 2020-11-17 NOTE — Plan of Care (Signed)
  Problem: Safety: Goal: Ability to remain free from injury will improve Description: Patient will be free from injury by 11/18/20 Outcome: Progressing

## 2020-11-17 NOTE — Progress Notes (Signed)
[facility]  PROGRESS NOTE  Shelby Brown KZS:[id number] DOB: 10-05-1980 DOA: 11/16/2020 PCP: Patient, No Pcp Per   Brief HPI:   40 year old female with medical history of uncontrolled insulin-dependent diabetes mellitus anemia, COVID-19 infection in December 2021 presented to ER with complaints of severe abdominal pain.  Patient states that she had occasional left-sided pain for months but the pain became severe a past 2 days.  She also had chills with pain.  She complains of frequent dysuria. Upon arrival to the ED she was found to be afebrile, saturating well on room air with initial blood pressure 89/61.  CT abdomen/pelvis was concerning for left-sided pyelonephritis.  Patient was given IV Rocephin in the ED.    Subjective   Patient seen and examined, denies any new complaints.   Assessment/Plan:     1. Acute pyelonephritis-patient presented with severe left-sided abdominal pain, also reported longstanding dysuria and fever at home.  CT findings concerning for left pyonephritis.  Patient started on Rocephin.  Follow urine culture results. 2. Diabetes mellitus type 2-hemoglobin A1c elevated at 12.7 in December 2020.  CBG not well controlled.  Continue sliding scale insulin with NovoLog.  We will add Lantus 10 units subcu daily. 3. Anemia-?  GI bleed, hemoglobin was 8.9 on admission.  Patient reports black stool for months.  She has severe iron deficiency anemia, with saturation ratio 9%, ferritin 67.  FOBT is pending.  Will follow results.     COVID-19 Labs  Recent Labs    11/17/20 0522  FERRITIN 61    Lab Results  Component Value Date   SARSCOV2NAA NEGATIVE 11/16/2020   SARSCOV2NAA POSITIVE (A) 09/01/2020     Scheduled medications:   . feeding supplement  237 mL Oral BID BM  . insulin aspart  0-9 Units Subcutaneous Q4H  . pantoprazole (PROTONIX) IV  40 mg Intravenous Q24H         CBG: Recent Labs  Lab 11/17/20 0434 11/17/20 0723 11/17/20 1214   GLUCAP 258* 135* 284*    SpO2: 100 %    CBC: Recent Labs  Lab 11/16/20 1934 11/17/20 0522 11/17/20 1411  WBC 10.6* 10.4 9.3  NEUTROABS 7.8*  --   --   HGB 8.9* 8.1* 8.2*  HCT 28.3* 25.9* 26.9*  MCV 84.7 84.6 85.9  PLT 647* 548* 583*    Basic Metabolic Panel: Recent Labs  Lab 11/16/20 1934 11/17/20 0522  NA 131* 133*  K 5.2* 4.3  CL 95* 100  CO2 27 24  GLUCOSE 379* 244*  BUN 17 15  CREATININE 0.69 0.58  CALCIUM 8.9 8.1*     Liver Function Tests: Recent Labs  Lab 11/16/20 1934  AST 11*  ALT 9  ALKPHOS 82  BILITOT 0.8  PROT 7.9  ALBUMIN 3.1*     Antibiotics: Anti-infectives (From admission, onward)   Start     Dose/Rate Route Frequency Ordered Stop   11/17/20 2200  cefTRIAXone (ROCEPHIN) 1 g in sodium chloride 0.9 % 100 mL IVPB        1 g 200 mL/hr over 30 Minutes Intravenous Every 24 hours 11/17/20 0217     11/16/20 2145  cefTRIAXone (ROCEPHIN) 1 g in sodium chloride 0.9 % 100 mL IVPB        1 g 200 mL/hr over 30 Minutes Intravenous  Once 11/16/20 2136 11/16/20 2226       DVT prophylaxis: SCDs   Code Status: Full code  Family Communication: No family at bedside   Consultants:  Procedures:      Objective   Vitals:   11/17/20 0431 11/17/20 0852 11/17/20 1238 11/17/20 1426  BP: 109/67 105/66 121/78 100/69  Pulse: 94 93 86 90  Resp: 18 14 16 17   Temp: 98.9 F (37.2 C) 98.7 F (37.1 C) 98.9 F (37.2 C) 98.1 F (36.7 C)  TempSrc: Oral Oral Oral Oral  SpO2: 97% 100% 100% 100%  Weight:      Height:        Intake/Output Summary (Last 24 hours) at 11/17/2020 1637 Last data filed at 11/17/2020 1300 Gross per 24 hour  Intake 580 ml  Output --  Net 580 ml    02/28 1901 - 03/02 0700 In: 100  Out: -   Filed Weights   11/16/20 1836  Weight: 45.4 kg    Physical Examination:    General-appears in no acute distress  Heart-S1-S2, regular, no murmur auscultated  Lungs-clear to auscultation bilaterally, no wheezing or  crackles auscultated  Abdomen-soft, positive left CVA tenderness to palpation  Extremities-no edema in the lower extremities  Neuro-alert, oriented x3, no focal deficit noted   Status is: Inpatient  Dispo: The patient is from: Home              Anticipated d/c is to: Home              Anticipated d/c date is: 11/19/20              Patient currently not stable for discharge  Barrier to discharge-ongoing treatment for pyelonephritis       Data Reviewed:   Recent Results (from the past 240 hour(s))  Resp Panel by RT-PCR (Flu A&B, Covid)     Status: None   Collection Time: 11/16/20 11:30 PM  Result Value Ref Range Status   SARS Coronavirus 2 by RT PCR NEGATIVE NEGATIVE Final    Comment: (NOTE) SARS-CoV-2 target nucleic acids are NOT DETECTED.  The SARS-CoV-2 RNA is generally detectable in upper respiratory specimens during the acute phase of infection. The lowest concentration of SARS-CoV-2 viral copies this assay can detect is 138 copies/mL. A negative result does not preclude SARS-Cov-2 infection and should not be used as the sole basis for treatment or other patient management decisions. A negative result may occur with  improper specimen collection/handling, submission of specimen other than nasopharyngeal swab, presence of viral mutation(s) within the areas targeted by this assay, and inadequate number of viral copies(<138 copies/mL). A negative result must be combined with clinical observations, patient history, and epidemiological information. The expected result is Negative.  Fact Sheet for Patients:  01/16/21  Fact Sheet for Healthcare Providers:  BloggerCourse.com  This test is no t yet approved or cleared by the [place] FDA and  has been authorized for detection and/or diagnosis of SARS-CoV-2 by FDA under an Emergency Use Authorization (EUA). This EUA will remain  in effect (meaning this test can  be used) for the duration of the COVID-19 declaration under Section 564(b)(1) of the Act, 21 U.S.C.section 360bbb-3(b)(1), unless the authorization is terminated  or revoked sooner.       Influenza A by PCR NEGATIVE NEGATIVE Final   Influenza B by PCR NEGATIVE NEGATIVE Final    Comment: (NOTE) The Xpert Xpress SARS-CoV-2/FLU/RSV plus assay is intended as an aid in the diagnosis of influenza from Nasopharyngeal swab specimens and should not be used as a sole basis for treatment. Nasal washings and aspirates are unacceptable for Xpert Xpress SARS-CoV-2/FLU/RSV testing.  Fact Sheet  for Patients: BloggerCourse.com  Fact Sheet for Healthcare Providers: [employer]ious[employer]ker[employer]it  This test is not yet approved or cleared by the [place] FDA and has been authorized for detection and/or diagnosis of SARS-CoV-2 by FDA under an Emergency Use Authorization (EUA). This EUA will remain in effect (meaning this test can be used) for the duration of the COVID-19 declaration under Section 564(b)(1) of the Act, 21 U.S.C. section 360bbb-3(b)(1), unless the authorization is terminated or revoked.  Performed at [facility], [address] [place], [place] [address]     Recent Labs  Lab 11/16/20 1934  LIPASE 27   No results for input(s): AMMONIA in the last 168 hours.  Cardiac Enzymes: No results for input(s): CKTOTAL, CKMB, CKMBINDEX, TROPONINI in the last 168 hours. BNP (last 3 results) No results for input(s): BNP in the last 8760 hours.  ProBNP (last 3 results) No results for input(s): PROBNP in the last 8760 hours.  Studies:  CT ABDOMEN PELVIS W CONTRAST  Result Date: 11/16/2020 CLINICAL DATA:  Lower abdominal pain, vomiting EXAM: CT ABDOMEN AND PELVIS WITH CONTRAST TECHNIQUE: Multidetector CT imaging of the abdomen and pelvis was performed using the standard protocol following bolus administration of intravenous  contrast. CONTRAST:  OMNIPAQUE IOHEXOL 300 MG/ML  SOLN COMPARISON:  09/09/2020 FINDINGS: Lower chest: Trace bilateral pleural effusions. No acute airspace disease. Hepatobiliary: No focal liver abnormality is seen. No gallstones, gallbladder wall thickening, or biliary dilatation. Pancreas: Unremarkable. No pancreatic ductal dilatation or surrounding inflammatory changes. Spleen: Normal in size without focal abnormality. Adrenals/Urinary Tract: The adrenals are unremarkable. The right kidney enhances normally. The left kidney is enlarged and edematous, with heterogeneous decreased enhancement consistent with pyelonephritis. The bladder is minimally distended with no focal abnormalities. Stomach/Bowel: No bowel obstruction or ileus. Normal gas-filled appendix right lower quadrant. No bowel wall thickening or inflammatory change. Vascular/Lymphatic: No significant vascular findings are present. Borderline enlarged left para-aortic lymph nodes are likely reactive, measuring up to 8 mm in short axis. No pathologic adenopathy. Reproductive: Uterus and bilateral adnexa are unremarkable. Other: No free fluid or free gas.  No abdominal wall hernia. Musculoskeletal: No acute or destructive bony lesions. Reconstructed images demonstrate no additional findings. IMPRESSION: 1. Left pyelonephritis.  No evidence of renal abscess. 2. Normal appendix. 3. Trace bilateral pleural effusions. Electronically Signed   By: Sharlet Salina M.D.   On: 11/16/2020 21:32       Meredeth Ide   [facility] If 7PM-7AM, please contact night-coverage at [email or web address], Office  [phone]   11/17/2020, 4:37 PM  LOS: 1 day

## 2020-11-17 NOTE — H&P (Signed)
History and Physical    Shelby Brown VQX:[id number] DOB: 05/05/1981 DOA: 11/16/2020  PCP: Patient, No Pcp Per   Patient coming from: Home   Chief Complaint: Left-sided abdominal pain, fever/chills   HPI: Shelby Brown is a 40 y.o. female with medical history significant for uncontrolled insulin-dependent diabetes mellitus, anemia, and COVID-19 infection in December 2021, now presenting to the emergency department with severe abdominal pain.  Patient reports that she has had occasional left-sided abdominal pain for months, but the pain became severe over the past 2 days and she is also had chills associated with this.  She reports frequent dysuria, also for months, but has not noticed any gross hematuria.  She reports loose black stool for at least 2 months now but denies any upper abdominal pain, nausea, or vomiting.  She denies using any over-the-counter analgesics.  She denies any alcohol use.  ED Course: Upon arrival to the ED, patient is found to be afebrile, saturating well on room air, and with initial blood pressure 89/61.  Chemistry panel notable for glucose 3 and 79 and potassium 5.2.  CBC features a WBC of 10,600, hemoglobin 8.9, and platelets 647,000.  CT of the abdomen and pelvis is concerning for left-sided pyelonephritis.  Urine culture was collected and the patient was given a liter of LR and 1 g IV Rocephin in the ED.  Review of Systems:  All other systems reviewed and apart from HPI, are negative.  Past Medical History:  Diagnosis Date  . Diabetes mellitus without complication [facility])     Past Surgical History:  Procedure Laterality Date  . CESAREAN SECTION    . TUBAL LIGATION      Social History:   reports that she has never smoked. She has never used smokeless tobacco. She reports that she does not drink alcohol and does not use drugs.  No Known Allergies  Family History  Problem Relation Age of Onset  . Cancer Father   . Diabetes Sister   .  Diabetes Sister      Prior to Admission medications   Medication Sig Start Date End Date Taking? Authorizing Provider  blood glucose meter kit and supplies KIT Dispense based on patient and insurance preference. Use up to four times daily as directed. (FOR ICD-9 250.00, 250.01). 09/13/20  Yes Little Ishikawa, MD  insulin NPH-regular Human (NOVOLIN 70/30 RELION) (70-30) 100 UNIT/ML injection Inject 8 Units into the skin 2 (two) times daily with a meal. 09/13/20  Yes Little Ishikawa, MD  Insulin Syringes, Disposable, U-100 0.5 ML MISC 1 Package by Does not apply route in the morning and at bedtime. 09/13/20  Yes Little Ishikawa, MD  metFORMIN (GLUCOPHAGE) 1000 MG tablet Take 1 tablet (1,000 mg total) by mouth 2 (two) times daily with a meal. 09/13/20  Yes Little Ishikawa, MD  ondansetron (ZOFRAN) 4 MG tablet Take 1 tablet (4 mg total) by mouth daily as needed for nausea or vomiting. Patient not taking: Reported on 11/16/2020 09/13/20 09/13/21  Little Ishikawa, MD  promethazine (PHENERGAN) 6.25 MG/5ML syrup Take 5 mLs (6.25 mg total) by mouth 4 (four) times daily as needed for nausea or vomiting. Patient not taking: Reported on 11/16/2020 09/13/20 09/13/21  Little Ishikawa, MD    Physical Exam: Vitals:   11/16/20 2030 11/16/20 2100 11/16/20 2151 11/17/20 0009  BP: 134/82 132/82 133/83 124/82  Pulse: 89 92 88 99  Resp: _0 Temp:    98.5 F (36.9 C)  TempSrc:    Oral  SpO2: 100% 100% 100% 98%  Weight:      Height:        Constitutional: NAD, calm  Eyes: PERTLA, lids and conjunctivae normal ENMT: Mucous membranes are moist. Posterior pharynx clear of any exudate or lesions.   Neck: normal, supple, no masses, no thyromegaly Respiratory: no wheezing, no crackles. No accessory muscle use.  Cardiovascular: S1 & S2 heard, regular rate and rhythm. No extremity edema.   Abdomen: No distension, soft, tender on left without rebound pain or guarding. Bowel  sounds active.  Musculoskeletal: no clubbing / cyanosis. No joint deformity upper and lower extremities.   Skin: no significant rashes, lesions, ulcers. Warm, dry, well-perfused. Neurologic: CN 2-12 grossly intact. Sensation intact. Moving all extremities.  Psychiatric: Alert and oriented to person, place, and situation. Calm and cooperative.    Labs and Imaging on Admission: I have personally reviewed following labs and imaging studies  CBC: Recent Labs  Lab 11/16/20 1934  WBC 10.6*  NEUTROABS 7.8*  HGB 8.9*  HCT 28.3*  MCV 84.7  PLT 195*   Basic Metabolic Panel: Recent Labs  Lab 11/16/20 1934  NA 131*  K 5.2*  CL 95*  CO2 27  GLUCOSE 379*  BUN 17  CREATININE 0.69  CALCIUM 8.9   GFR: Estimated Creatinine Clearance: 66.3 mL/min (by C-G formula based on SCr of 0.69 mg/dL). Liver Function Tests: Recent Labs  Lab 11/16/20 1934  AST 11*  ALT 9  ALKPHOS 82  BILITOT 0.8  PROT 7.9  ALBUMIN 3.1*   Recent Labs  Lab 11/16/20 1934  LIPASE 27   No results for input(s): AMMONIA in the last 168 hours. Coagulation Profile: No results for input(s): INR, PROTIME in the last 168 hours. Cardiac Enzymes: No results for input(s): CKTOTAL, CKMB, CKMBINDEX, TROPONINI in the last 168 hours. BNP (last 3 results) No results for input(s): PROBNP in the last 8760 hours. HbA1C: No results for input(s): HGBA1C in the last 72 hours. CBG: No results for input(s): GLUCAP in the last 168 hours. Lipid Profile: No results for input(s): CHOL, HDL, LDLCALC, TRIG, CHOLHDL, LDLDIRECT in the last 72 hours. Thyroid Function Tests: No results for input(s): TSH, T4TOTAL, FREET4, T3FREE, THYROIDAB in the last 72 hours. Anemia Panel: No results for input(s): VITAMINB12, FOLATE, FERRITIN, TIBC, IRON, RETICCTPCT in the last 72 hours. Urine analysis:    Component Value Date/Time   COLORURINE YELLOW 11/16/2020 1934   APPEARANCEUR HAZY (A) 11/16/2020 1934   LABSPEC 1.025 11/16/2020 1934    PHURINE 5.0 11/16/2020 1934   GLUCOSEU >=500 (A) 11/16/2020 1934   GLUCOSEU > 1000 mg/dL (A) 12/10/2007 2144   HGBUR MODERATE (A) 11/16/2020 1934   BILIRUBINUR NEGATIVE 11/16/2020 1934   BILIRUBINUR small (A) 08/06/2017 1044   KETONESUR 20 (A) 11/16/2020 1934   PROTEINUR NEGATIVE 11/16/2020 1934   UROBILINOGEN 0.2 08/06/2017 1044   UROBILINOGEN 0.2 11/28/2014 1725   NITRITE NEGATIVE 11/16/2020 1934   LEUKOCYTESUR MODERATE (A) 11/16/2020 1934   Sepsis Labs: _0 (procalcitonin:4,lacticidven:4) ) Recent Results (from the past 240 hour(s))  Resp Panel by RT-PCR (Flu A&B, Covid)     Status: None   Collection Time: 11/16/20 11:30 PM  Result Value Ref Range Status   SARS Coronavirus 2 by RT PCR NEGATIVE NEGATIVE Final    Comment: (NOTE) SARS-CoV-2 target nucleic acids are NOT DETECTED.  The SARS-CoV-2 RNA is generally detectable in upper respiratory specimens during the acute phase of infection. The lowest concentration of SARS-CoV-2 viral copies  this assay can detect is 138 copies/mL. A negative result does not preclude SARS-Cov-2 infection and should not be used as the sole basis for treatment or other patient management decisions. A negative result may occur with  improper specimen collection/handling, submission of specimen other than nasopharyngeal swab, presence of viral mutation(s) within the areas targeted by this assay, and inadequate number of viral copies(<138 copies/mL). A negative result must be combined with clinical observations, patient history, and epidemiological information. The expected result is Negative.  Fact Sheet for Patients:  EntrepreneurPulse.com.au  Fact Sheet for Healthcare Providers:  IncredibleEmployment.be  This test is no t yet approved or cleared by the [place] FDA and  has been authorized for detection and/or diagnosis of SARS-CoV-2 by FDA under an Emergency Use Authorization (EUA). This EUA will  remain  in effect (meaning this test can be used) for the duration of the COVID-19 declaration under Section 564(b)(1) of the Act, 21 U.S.C.section 360bbb-3(b)(1), unless the authorization is terminated  or revoked sooner.       Influenza A by PCR NEGATIVE NEGATIVE Final   Influenza B by PCR NEGATIVE NEGATIVE Final    Comment: (NOTE) The Xpert Xpress SARS-CoV-2/FLU/RSV plus assay is intended as an aid in the diagnosis of influenza from Nasopharyngeal swab specimens and should not be used as a sole basis for treatment. Nasal washings and aspirates are unacceptable for Xpert Xpress SARS-CoV-2/FLU/RSV testing.  Fact Sheet for Patients: EntrepreneurPulse.com.au  Fact Sheet for Healthcare Providers: IncredibleEmployment.be  This test is not yet approved or cleared by the [place] FDA and has been authorized for detection and/or diagnosis of SARS-CoV-2 by FDA under an Emergency Use Authorization (EUA). This EUA will remain in effect (meaning this test can be used) for the duration of the COVID-19 declaration under Section 564(b)(1) of the Act, 21 U.S.C. section 360bbb-3(b)(1), unless the authorization is terminated or revoked.  Performed at [facility], [place] [address], [place], [place] [address]      Radiological Exams on Admission: CT ABDOMEN PELVIS W CONTRAST  Result Date: 11/16/2020 CLINICAL DATA:  Lower abdominal pain, vomiting EXAM: CT ABDOMEN AND PELVIS WITH CONTRAST TECHNIQUE: Multidetector CT imaging of the abdomen and pelvis was performed using the standard protocol following bolus administration of intravenous contrast. CONTRAST:  178m OMNIPAQUE IOHEXOL 300 MG/ML  SOLN COMPARISON:  09/09/2020 FINDINGS: Lower chest: Trace bilateral pleural effusions. No acute airspace disease. Hepatobiliary: No focal liver abnormality is seen. No gallstones, gallbladder wall thickening, or biliary dilatation. Pancreas: Unremarkable.  No pancreatic ductal dilatation or surrounding inflammatory changes. Spleen: Normal in size without focal abnormality. Adrenals/Urinary Tract: The adrenals are unremarkable. The right kidney enhances normally. The left kidney is enlarged and edematous, with heterogeneous decreased enhancement consistent with pyelonephritis. The bladder is minimally distended with no focal abnormalities. Stomach/Bowel: No bowel obstruction or ileus. Normal gas-filled appendix right lower quadrant. No bowel wall thickening or inflammatory change. Vascular/Lymphatic: No significant vascular findings are present. Borderline enlarged left para-aortic lymph nodes are likely reactive, measuring up to 8 mm in short axis. No pathologic adenopathy. Reproductive: Uterus and bilateral adnexa are unremarkable. Other: No free fluid or free gas.  No abdominal wall hernia. Musculoskeletal: No acute or destructive bony lesions. Reconstructed images demonstrate no additional findings. IMPRESSION: 1. Left pyelonephritis.  No evidence of renal abscess. 2. Normal appendix. 3. Trace bilateral pleural effusions. Electronically Signed   By: MRanda NgoM.D.   On: 11/16/2020 21:32    Assessment/Plan   1. Pyelonephritis  -  Presents with severe left-sided abdominal pain, reports longstanding dysuria and fevers at home for 2 days, and has CT findings concerning for left pyelonephritis  - Urine was cultured in ED and she was started on Rocephin  - Continue Rocephin, follow culture and clinical course    2. Uncontrolled IDDM  - A1c was 12.7% in December 2021  - Continue CBG checks and insulin    3. Anemia, ?GIB  - Hgb is 8.9 on admission, down from 10.9 two months ago  - Patient reports loose dark/black stools for months, no upper abdominal pain, and no NSAID use  - Check FOBT, trend H&H, check anemia panel, and start PPI for now    DVT prophylaxis: SCDs Code Status: Full  Level of Care: Level of care: Telemetry Family Communication:  Daughter updated at bedside at patient's request Disposition Plan:  Patient is from: Home  Anticipated d/c is to: Home  Anticipated d/c date is: 11/18/20 Patient currently: Pending FOBT, trending CBC, improved pain  Consults called: None  Admission status: Inpatient     Vianne Bulls, MD [facility]  11/17/2020, 2:18 AM

## 2020-11-18 LAB — BASIC METABOLIC PANEL
Anion gap: 8 (ref 5–15)
BUN: 13 mg/dL (ref 6–20)
CO2: 25 mmol/L (ref 22–32)
Calcium: 8.4 mg/dL — ABNORMAL LOW (ref 8.9–10.3)
Chloride: 100 mmol/L (ref 98–111)
Creatinine, Ser: 0.53 mg/dL (ref 0.44–1.00)
GFR, Estimated: >60 mL/min (ref >60)
Glucose, Bld: 173 mg/dL — ABNORMAL HIGH (ref 70–99)
Potassium: 3.9 mmol/L (ref 3.5–5.1)
Sodium: 133 mmol/L — ABNORMAL LOW (ref 135–145)

## 2020-11-18 LAB — CBC
HCT: 24.2 % — ABNORMAL LOW (ref 36.0–46.0)
HCT: 26.7 % — ABNORMAL LOW (ref 36.0–46.0)
Hemoglobin: 7.5 g/dL — ABNORMAL LOW (ref 12.0–15.0)
Hemoglobin: 8.3 g/dL — ABNORMAL LOW (ref 12.0–15.0)
MCH: 26.4 pg (ref 26.0–34.0)
MCH: 26.4 pg (ref 26.0–34.0)
MCHC: 31 g/dL (ref 30.0–36.0)
MCHC: 31.1 g/dL (ref 30.0–36.0)
MCV: 85.2 fL (ref 80.0–100.0)
MCV: 85 fL (ref 80.0–100.0)
Platelets: 531 10*3/uL — ABNORMAL HIGH (ref 150–400)
Platelets: 557 10*3/uL — ABNORMAL HIGH (ref 150–400)
RBC: 2.84 MIL/uL — ABNORMAL LOW (ref 3.87–5.11)
RBC: 3.14 MIL/uL — ABNORMAL LOW (ref 3.87–5.11)
RDW: 14 % (ref 11.5–15.5)
RDW: 14.1 % (ref 11.5–15.5)
WBC: 10.7 10*3/uL — ABNORMAL HIGH (ref 4.0–10.5)
WBC: 7.6 10*3/uL (ref 4.0–10.5)
nRBC: 0 % (ref 0.0–0.2)
nRBC: 0 % (ref 0.0–0.2)

## 2020-11-18 LAB — GLUCOSE, CAPILLARY
Glucose-Capillary: 120 mg/dL — ABNORMAL HIGH (ref 70–99)
Glucose-Capillary: 176 mg/dL — ABNORMAL HIGH (ref 70–99)
Glucose-Capillary: 198 mg/dL — ABNORMAL HIGH (ref 70–99)
Glucose-Capillary: 208 mg/dL — ABNORMAL HIGH (ref 70–99)
Glucose-Capillary: 261 mg/dL — ABNORMAL HIGH (ref 70–99)
Glucose-Capillary: 281 mg/dL — ABNORMAL HIGH (ref 70–99)

## 2020-11-18 MED ORDER — ADULT MULTIVITAMIN W/MINERALS CH
1.0000 | ORAL_TABLET | Freq: Every day | ORAL | Status: DC
  Administered 2020-11-18 – 2020-11-19 (×2): 1 via ORAL
  Filled 2020-11-18 (×2): qty 1

## 2020-11-18 MED ORDER — GLUCERNA SHAKE PO LIQD
237.0000 mL | Freq: Three times a day (TID) | ORAL | Status: DC
  Administered 2020-11-18 – 2020-11-19 (×3): 237 mL via ORAL
  Filled 2020-11-18 (×5): qty 237

## 2020-11-18 NOTE — Progress Notes (Signed)
[facility]  PROGRESS NOTE  Ardyth Galvelia Campos-Ramirez ZOX:[id number] DOB: 07-Jun-1981 DOA: 11/16/2020 PCP: Patient, No Pcp Per   Brief HPI:   40 year old female with medical history of uncontrolled insulin-dependent diabetes mellitus anemia, COVID-19 infection in December 2021 presented to ER with complaints of severe abdominal pain.  Patient states that she had occasional left-sided pain for months but the pain became severe a past 2 days.  She also had chills with pain.  She complains of frequent dysuria. Upon arrival to the ED she was found to be afebrile, saturating well on room air with initial blood pressure 89/61.  CT abdomen/pelvis was concerning for left-sided pyelonephritis.  Patient was given IV Rocephin in the ED.    Subjective   Patient seen and examined, denies any complaints.  Urine culture growing greater than 100,000 colonies per mL of E. coli.   Assessment/Plan:     1. Acute pyelonephritis-patient presented with severe left-sided abdominal pain, also reported longstanding dysuria and fever at home.  CT findings concerning for left pyonephritis.  Urine culture growing greater than 100,000 colonies per mL of E. coli.  Patient started on Rocephin.  Follow urine culture results. 2. Diabetes mellitus type 2-hemoglobin A1c elevated at 12.7 in December 2020.  CBG not well controlled.  Continue sliding scale insulin with NovoLog.  Continue Lantus 10 units subcu daily.  We will add 3 units 3 times daily meal coverage with NovoLog. 3. Anemia-?  GI bleed, hemoglobin was 8.9 on admission.  This morning hemoglobin is 8.3.  Patient reports black stool for months.  She has severe iron deficiency anemia, with saturation ratio 9%, ferritin 67.  FOBT is pending.  Will follow results.     COVID-19 Labs  Recent Labs    11/17/20 0522  FERRITIN 61    Lab Results  Component Value Date   SARSCOV2NAA NEGATIVE 11/16/2020   SARSCOV2NAA POSITIVE (A) 09/01/2020     Scheduled  medications:   . feeding supplement (GLUCERNA SHAKE)  237 mL Oral TID BM  . insulin aspart  0-9 Units Subcutaneous Q4H  . insulin glargine  10 Units Subcutaneous QHS  . multivitamin with minerals  1 tablet Oral Daily  . pantoprazole (PROTONIX) IV  40 mg Intravenous Q24H         CBG: Recent Labs  Lab 11/17/20 2005 11/18/20 0007 11/18/20 0411 11/18/20 0731 11/18/20 1151  GLUCAP 190* 120* 176* 198* 281*    SpO2: 99 %    CBC: Recent Labs  Lab 11/16/20 1934 11/17/20 0522 11/17/20 1411 11/18/20 0206 11/18/20 1128  WBC 10.6* 10.4 9.3 10.7* 7.6  NEUTROABS 7.8*  --   --   --   --   HGB 8.9* 8.1* 8.2* 7.5* 8.3*  HCT 28.3* 25.9* 26.9* 24.2* 26.7*  MCV 84.7 84.6 85.9 85.2 85.0  PLT 647* 548* 583* 531* 557*    Basic Metabolic Panel: Recent Labs  Lab 11/16/20 1934 11/17/20 0522 11/18/20 0206  NA 131* 133* 133*  K 5.2* 4.3 3.9  CL 95* 100 100  CO2 27 24 25   GLUCOSE 379* 244* 173*  BUN 17 15 13   CREATININE 0.69 0.58 0.53  CALCIUM 8.9 8.1* 8.4*     Liver Function Tests: Recent Labs  Lab 11/16/20 1934  AST 11*  ALT 9  ALKPHOS 82  BILITOT 0.8  PROT 7.9  ALBUMIN 3.1*     Antibiotics: Anti-infectives (From admission, onward)   Start     Dose/Rate Route Frequency Ordered Stop   11/17/20 2200  cefTRIAXone (ROCEPHIN) 1 g in sodium chloride 0.9 % 100 mL IVPB        1 g 200 mL/hr over 30 Minutes Intravenous Every 24 hours 11/17/20 0217     11/16/20 2145  cefTRIAXone (ROCEPHIN) 1 g in sodium chloride 0.9 % 100 mL IVPB        1 g 200 mL/hr over 30 Minutes Intravenous  Once 11/16/20 2136 11/16/20 2226       DVT prophylaxis: SCDs   Code Status: Full code  Family Communication: No family at bedside   Consultants:    Procedures:      Objective   Vitals:   11/17/20 1426 11/17/20 2004 11/18/20 0413 11/18/20 1359  BP: 100/69 (!) 89/59 106/71 95/60  Pulse: 90 88 84 80  Resp: 17 17 17 14   Temp: 98.1 F (36.7 C) 98.1 F (36.7 C) 97.7 F (36.5  C) 98.2 F (36.8 C)  TempSrc: Oral Oral Oral Oral  SpO2: 100% 100% 98% 99%  Weight:      Height:        Intake/Output Summary (Last 24 hours) at 11/18/2020 1415 Last data filed at 11/18/2020 1400 Gross per 24 hour  Intake 340 ml  Output --  Net 340 ml    03/01 1901 - 03/03 0700 In: 680 [P.O.:480] Out: -   Filed Weights   11/16/20 1836  Weight: 45.4 kg    Physical Examination:    General-appears in no acute distress  Heart-S1-S2, regular, no murmur auscultated  Lungs-clear to auscultation bilaterally, no wheezing or crackles auscultated  Abdomen-soft, positive left CVA tenderness to palpation   Extremities-no edema in the lower extremities  Neuro-alert, oriented x3, no focal deficit noted   Status is: Inpatient  Dispo: The patient is from: Home              Anticipated d/c is to: Home              Anticipated d/c date is: 11/19/20              Patient currently not stable for discharge  Barrier to discharge-ongoing treatment for pyelonephritis       Data Reviewed:   Recent Results (from the past 240 hour(s))  Urine culture     Status: Abnormal (Preliminary result)   Collection Time: 11/16/20  9:37 PM   Specimen: Urine, Random  Result Value Ref Range Status   Specimen Description   Final    URINE, RANDOM Performed at [facility], [address] [place], [place]    Special Requests   Final    NONE Performed at [facility], [address] [place], [place]    Culture (A)  Final    >=100,000 COLONIES/mL ESCHERICHIA COLI SUSCEPTIBILITIES TO FOLLOW Performed at [facility], [address], [place], [place]    Report Status PENDING  Incomplete  Resp Panel by RT-PCR (Flu A&B, Covid)     Status: None   Collection Time: 11/16/20 11:30 PM  Result Value Ref Range Status   SARS Coronavirus 2 by RT PCR NEGATIVE NEGATIVE Final    Comment: (NOTE) SARS-CoV-2 target nucleic acids are  NOT DETECTED.  The SARS-CoV-2 RNA is generally detectable in upper respiratory specimens during the acute phase of infection. The lowest concentration of SARS-CoV-2 viral copies this assay can detect is 138 copies/mL. A negative result does not preclude SARS-Cov-2 infection and should not be used as the sole basis  for treatment or other patient management decisions. A negative result may occur with  improper specimen collection/handling, submission of specimen other than nasopharyngeal swab, presence of viral mutation(s) within the areas targeted by this assay, and inadequate number of viral copies(<138 copies/mL). A negative result must be combined with clinical observations, patient history, and epidemiological information. The expected result is Negative.  Fact Sheet for Patients:  BloggerCourse.com  Fact Sheet for Healthcare Providers:  SeriousBroker.it  This test is no t yet approved or cleared by the [place] FDA and  has been authorized for detection and/or diagnosis of SARS-CoV-2 by FDA under an Emergency Use Authorization (EUA). This EUA will remain  in effect (meaning this test can be used) for the duration of the COVID-19 declaration under Section 564(b)(1) of the Act, 21 U.S.C.section 360bbb-3(b)(1), unless the authorization is terminated  or revoked sooner.       Influenza A by PCR NEGATIVE NEGATIVE Final   Influenza B by PCR NEGATIVE NEGATIVE Final    Comment: (NOTE) The Xpert Xpress SARS-CoV-2/FLU/RSV plus assay is intended as an aid in the diagnosis of influenza from Nasopharyngeal swab specimens and should not be used as a sole basis for treatment. Nasal washings and aspirates are unacceptable for Xpert Xpress SARS-CoV-2/FLU/RSV testing.  Fact Sheet for Patients: BloggerCourse.com  Fact Sheet for Healthcare Providers: SeriousBroker.it  This test is not  yet approved or cleared by the [place] FDA and has been authorized for detection and/or diagnosis of SARS-CoV-2 by FDA under an Emergency Use Authorization (EUA). This EUA will remain in effect (meaning this test can be used) for the duration of the COVID-19 declaration under Section 564(b)(1) of the Act, 21 U.S.C. section 360bbb-3(b)(1), unless the authorization is terminated or revoked.  Performed at [facility], [address] [place], [place] [address]     Recent Labs  Lab 11/16/20 1934  LIPASE 27   No results for input(s): AMMONIA in the last 168 hours.  Cardiac Enzymes: No results for input(s): CKTOTAL, CKMB, CKMBINDEX, TROPONINI in the last 168 hours. BNP (last 3 results) No results for input(s): BNP in the last 8760 hours.  ProBNP (last 3 results) No results for input(s): PROBNP in the last 8760 hours.  Studies:  CT ABDOMEN PELVIS W CONTRAST  Result Date: 11/16/2020 CLINICAL DATA:  Lower abdominal pain, vomiting EXAM: CT ABDOMEN AND PELVIS WITH CONTRAST TECHNIQUE: Multidetector CT imaging of the abdomen and pelvis was performed using the standard protocol following bolus administration of intravenous contrast. CONTRAST:  OMNIPAQUE IOHEXOL 300 MG/ML  SOLN COMPARISON:  09/09/2020 FINDINGS: Lower chest: Trace bilateral pleural effusions. No acute airspace disease. Hepatobiliary: No focal liver abnormality is seen. No gallstones, gallbladder wall thickening, or biliary dilatation. Pancreas: Unremarkable. No pancreatic ductal dilatation or surrounding inflammatory changes. Spleen: Normal in size without focal abnormality. Adrenals/Urinary Tract: The adrenals are unremarkable. The right kidney enhances normally. The left kidney is enlarged and edematous, with heterogeneous decreased enhancement consistent with pyelonephritis. The bladder is minimally distended with no focal abnormalities. Stomach/Bowel: No bowel obstruction or ileus. Normal gas-filled  appendix right lower quadrant. No bowel wall thickening or inflammatory change. Vascular/Lymphatic: No significant vascular findings are present. Borderline enlarged left para-aortic lymph nodes are likely reactive, measuring up to 8 mm in short axis. No pathologic adenopathy. Reproductive: Uterus and bilateral adnexa are unremarkable. Other: No free fluid or free gas.  No abdominal wall hernia. Musculoskeletal: No acute or destructive bony lesions. Reconstructed images demonstrate no additional findings. IMPRESSION: 1.  Left pyelonephritis.  No evidence of renal abscess. 2. Normal appendix. 3. Trace bilateral pleural effusions. Electronically Signed   By: Sharlet Salina M.D.   On: 11/16/2020 21:32       Meredeth Ide   [facility] If 7PM-7AM, please contact night-coverage at [email or web address], Office  [phone]   11/18/2020, 2:15 PM  LOS: 2 days

## 2020-11-18 NOTE — Progress Notes (Signed)
Lab notified RN that OB stool card that was sent down could not be processed because the card was expired. Obtained in-date cards and will send sample upon next BM. Mick Sell RN

## 2020-11-18 NOTE — Progress Notes (Signed)
Initial Nutrition Assessment  DOCUMENTATION CODES:   Underweight  INTERVENTION:   -Glucerna Shake po TID, each supplement provides 220 kcal and 10 grams of protein  -Multivitamin with minerals daily  NUTRITION DIAGNOSIS:   Inadequate oral intake related to acute illness as evidenced by per patient/family report.  GOAL:   Patient will meet greater than or equal to 90% of their needs  MONITOR:   PO intake,Supplement acceptance,Labs,Weight trends,I & O's  REASON FOR ASSESSMENT:   Malnutrition Screening Tool    ASSESSMENT:   40 year old female with medical history of uncontrolled insulin-dependent diabetes mellitus anemia, COVID-19 infection in December 2021 presented to ER with complaints of severe abdominal pain.  Patient states that she had occasional left-sided pain for months but the pain became severe a past 2 days.  She also had chills with pain.  She complains of frequent dysuria.CT abdomen/pelvis was concerning for left-sided pyelonephritis.  Patient was having poor appetite and nausea PTA d/t abdominal pain.  Currently consuming 100% of meals. Ensure supplements were ordered, will switch to Glucerna shakes TID and add daily MVI.   Per weight records, weight has trended down since 2015.  Medications reviewed.  Labs reviewed: CBGs: 176-281 Low Na  NUTRITION - FOCUSED PHYSICAL EXAM:  Unable to complete  Diet Order:   Diet Order            Diet heart healthy/carb modified Room service appropriate? Yes; Fluid consistency: Thin  Diet effective now                 EDUCATION NEEDS:   No education needs have been identified at this time  Skin:  Skin Assessment: Reviewed RN Assessment  Last BM:  3/2 -type 7  Height:   Ht Readings from Last 1 Encounters:  11/16/20 5\' 3"  (1.6 m)    Weight:   Wt Readings from Last 1 Encounters:  11/16/20 45.4 kg   BMI:  Body mass index is 17.71 kg/m.  Estimated Nutritional Needs:   Kcal:   1250-1450  Protein:  55-70g  Fluid:  1.5L/day  01/16/21, MS, RD, LDN Inpatient Clinical Dietitian Contact information available via Amion

## 2020-11-19 DIAGNOSIS — D509 Iron deficiency anemia, unspecified: Secondary | ICD-10-CM

## 2020-11-19 LAB — URINE CULTURE: Culture: >=100000 — AB

## 2020-11-19 LAB — CBC
HCT: 26 % — ABNORMAL LOW (ref 36.0–46.0)
Hemoglobin: 8.2 g/dL — ABNORMAL LOW (ref 12.0–15.0)
MCH: 26.9 pg (ref 26.0–34.0)
MCHC: 31.5 g/dL (ref 30.0–36.0)
MCV: 85.2 fL (ref 80.0–100.0)
Platelets: 570 10*3/uL — ABNORMAL HIGH (ref 150–400)
RBC: 3.05 MIL/uL — ABNORMAL LOW (ref 3.87–5.11)
RDW: 14 % (ref 11.5–15.5)
WBC: 8.1 10*3/uL (ref 4.0–10.5)
nRBC: 0 % (ref 0.0–0.2)

## 2020-11-19 LAB — BASIC METABOLIC PANEL
Anion gap: 8 (ref 5–15)
BUN: 14 mg/dL (ref 6–20)
CO2: 27 mmol/L (ref 22–32)
Calcium: 8.7 mg/dL — ABNORMAL LOW (ref 8.9–10.3)
Chloride: 99 mmol/L (ref 98–111)
Creatinine, Ser: 0.67 mg/dL (ref 0.44–1.00)
GFR, Estimated: >60 mL/min (ref >60)
Glucose, Bld: 158 mg/dL — ABNORMAL HIGH (ref 70–99)
Potassium: 4.2 mmol/L (ref 3.5–5.1)
Sodium: 134 mmol/L — ABNORMAL LOW (ref 135–145)

## 2020-11-19 LAB — GLUCOSE, CAPILLARY
Glucose-Capillary: 106 mg/dL — ABNORMAL HIGH (ref 70–99)
Glucose-Capillary: 126 mg/dL — ABNORMAL HIGH (ref 70–99)
Glucose-Capillary: 151 mg/dL — ABNORMAL HIGH (ref 70–99)
Glucose-Capillary: 168 mg/dL — ABNORMAL HIGH (ref 70–99)

## 2020-11-19 MED ORDER — CEPHALEXIN 500 MG PO CAPS: 500.0000 mg | ORAL_CAPSULE | Freq: Four times a day (QID) | ORAL | 0 refills | Status: AC

## 2020-11-19 MED ORDER — NOVOLIN 70/30 RELION (70-30) 100 UNIT/ML ~~LOC~~ SUSP: 10.0000 [IU] | Freq: Two times a day (BID) | SUBCUTANEOUS | Status: DC

## 2020-11-19 MED ORDER — ONDANSETRON HCL 4 MG PO TABS: 4.0000 mg | ORAL_TABLET | Freq: Three times a day (TID) | ORAL | 1 refills | Status: DC | PRN

## 2020-11-19 MED ORDER — CEPHALEXIN 500 MG PO CAPS
500.0000 mg | ORAL_CAPSULE | Freq: Four times a day (QID) | ORAL | Status: DC
  Administered 2020-11-19: 500 mg via ORAL
  Filled 2020-11-19: qty 1

## 2020-11-19 MED ORDER — HYDROCODONE-ACETAMINOPHEN 5-325 MG PO TABS: 1.0000 | ORAL_TABLET | Freq: Four times a day (QID) | ORAL | 0 refills | Status: DC | PRN

## 2020-11-19 MED ORDER — FERROUS GLUCONATE 324 (38 FE) MG PO TABS: 324.0000 mg | ORAL_TABLET | Freq: Every day | ORAL | 3 refills | Status: DC

## 2020-11-19 MED ORDER — FERROUS GLUCONATE 324 (38 FE) MG PO TABS: 324.0000 mg | ORAL_TABLET | Freq: Every day | ORAL | Status: DC

## 2020-11-19 NOTE — Discharge Summary (Signed)
Physician Discharge Summary  Shelby Brown MWN:[id number] DOB: 06-18-1981 DOA: 11/16/2020  PCP: Patient, No Pcp Per  Admit date: 11/16/2020 Discharge date: 11/19/2020  Time spent: 50* minutes  Recommendations for Outpatient Follow-up:  1. Follow-up community health and wellness clinic on 12/21/2020  Discharge Diagnoses:  Principal Problem:   Pyelonephritis of left kidney Active Problems:   Diabetes type 2, uncontrolled ([place])   Anemia   Melena   Discharge Condition: Stable  Diet recommendation: Carb modified diet  Filed Weights   11/16/20 1836  Weight: 45.4 kg    History of present illness:  40 year old female with medical history of uncontrolled insulin-dependent diabetes mellitus anemia, COVID-19 infection in December 2021 presented to ER with complaints of severe abdominal pain.  Patient states that she had occasional left-sided pain for months but the pain became severe a past 2 days.  She also had chills with pain.  She complains of frequent dysuria. Upon arrival to the ED she was found to be afebrile, saturating well on room air with initial blood pressure 89/61.  CT abdomen/pelvis was concerning for left-sided pyelonephritis.  Patient was given IV Rocephin in the ED.  Hospital Course:  1. Acute pyelonephritis-patient presented with severe left-sided abdominal pain, also reported longstanding dysuria and fever at home.  CT findings concerning for left pyonephritis.  Urine culture growing greater than 100,000 colonies per mL of E. coli.  Patient started on Rocephin.    E. coli is sensitive to cefazolin.  Will discharge on Keflex 500 mg p.o. 4 times daily for 10 more days. 2. Diabetes mellitus type 2-hemoglobin A1c elevated at 12.7 in December 2020.  CBG not well controlled.    We will increase the dose of NovoLog 70/30 to 10 units subcu twice daily 3. Anemia-?  GI bleed, hemoglobin was 8.9 on admission.  This morning hemoglobin is 8.3.    Hemoglobin went up from 7.5 yesterday  to 8.3 today.  This is likely at baseline.  She does have iron deficiency anemia.  We will start her on ferrous gluconate 324 mg p.o. daily.  Follow-up GI as outpatient   Procedures:    Consultations:    Discharge Exam: Vitals:   11/19/20 0401 11/19/20 1350  BP: 90/60 108/70  Pulse: 83 81  Resp: 14 14  Temp: 98.6 F (37 C) 97.8 F (36.6 C)  SpO2: 98% 100%    Appears in no acute distress Cardiovascular: S1-S2, regular Respiratory: Clear to auscultation bilaterally  Discharge Instructions   Discharge Instructions    Diet - low sodium heart healthy   Complete by: As directed    Increase activity slowly   Complete by: As directed      Allergies as of 11/19/2020   No Known Allergies     Medication List    STOP taking these medications   promethazine 6.25 MG/5ML syrup Commonly known as: PHENERGAN     TAKE these medications   blood glucose meter kit and supplies Kit Dispense based on patient and insurance preference. Use up to four times daily as directed. (FOR ICD-9 250.00, 250.01).   cephALEXin 500 MG capsule Commonly known as: KEFLEX Take 1 capsule (500 mg total) by mouth every 6 (six) hours for 10 days.   ferrous gluconate 324 MG tablet Commonly known as: FERGON Take 1 tablet (324 mg total) by mouth daily with breakfast. Start taking on: November 20, 2020   HYDROcodone-acetaminophen 5-325 MG tablet Commonly known as: NORCO/VICODIN Take 1 tablet by mouth every 6 (six) hours as  needed for moderate pain.   Insulin Syringes (Disposable) U-100 0.5 ML Misc 1 Package by Does not apply route in the morning and at bedtime.   metFORMIN 1000 MG tablet Commonly known as: GLUCOPHAGE Take 1 tablet (1,000 mg total) by mouth 2 (two) times daily with a meal.   NovoLIN 70/30 ReliOn (70-30) 100 UNIT/ML injection Generic drug: insulin NPH-regular Human Inject 10 Units into the skin 2 (two) times daily with a meal. What changed: how much to take   ondansetron 4 MG  tablet Commonly known as: Zofran Take 1 tablet (4 mg total) by mouth every 8 (eight) hours as needed for nausea or vomiting. What changed: when to take this      No Known Allergies  Follow-up [facility] Follow up on 12/21/2020.   Contact information: [address] [place] [address] [phone]               The results of significant diagnostics from this hospitalization (including imaging, microbiology, ancillary and laboratory) are listed below for reference.    Significant Diagnostic Studies: CT ABDOMEN PELVIS W CONTRAST  Result Date: 11/16/2020 CLINICAL DATA:  Lower abdominal pain, vomiting EXAM: CT ABDOMEN AND PELVIS WITH CONTRAST TECHNIQUE: Multidetector CT imaging of the abdomen and pelvis was performed using the standard protocol following bolus administration of intravenous contrast. CONTRAST:  151mL OMNIPAQUE IOHEXOL 300 MG/ML  SOLN COMPARISON:  09/09/2020 FINDINGS: Lower chest: Trace bilateral pleural effusions. No acute airspace disease. Hepatobiliary: No focal liver abnormality is seen. No gallstones, gallbladder wall thickening, or biliary dilatation. Pancreas: Unremarkable. No pancreatic ductal dilatation or surrounding inflammatory changes. Spleen: Normal in size without focal abnormality. Adrenals/Urinary Tract: The adrenals are unremarkable. The right kidney enhances normally. The left kidney is enlarged and edematous, with heterogeneous decreased enhancement consistent with pyelonephritis. The bladder is minimally distended with no focal abnormalities. Stomach/Bowel: No bowel obstruction or ileus. Normal gas-filled appendix right lower quadrant. No bowel wall thickening or inflammatory change. Vascular/Lymphatic: No significant vascular findings are present. Borderline enlarged left para-aortic lymph nodes are likely reactive, measuring up to 8 mm in short axis. No pathologic adenopathy. Reproductive:  Uterus and bilateral adnexa are unremarkable. Other: No free fluid or free gas.  No abdominal wall hernia. Musculoskeletal: No acute or destructive bony lesions. Reconstructed images demonstrate no additional findings. IMPRESSION: 1. Left pyelonephritis.  No evidence of renal abscess. 2. Normal appendix. 3. Trace bilateral pleural effusions. Electronically Signed   By: Randa Ngo M.D.   On: 11/16/2020 21:32    Microbiology: Recent Results (from the past 240 hour(s))  Urine culture     Status: Abnormal   Collection Time: 11/16/20  9:37 PM   Specimen: Urine, Random  Result Value Ref Range Status   Specimen Description   Final    URINE, RANDOM Performed at [facility] [address] [place], [place] [address]    Special Requests   Final    NONE Performed at [facility], [place] [address] [place],  [address]    Culture >=100,000 COLONIES/mL ESCHERICHIA COLI (A)  Final   Report Status 11/19/2020 FINAL  Final   Organism ID, Bacteria ESCHERICHIA COLI (A)  Final      Susceptibility   Escherichia coli - MIC*    AMPICILLIN >=32 RESISTANT Resistant     CEFAZOLIN <=4 SENSITIVE Sensitive     CEFEPIME <=0.12 SENSITIVE Sensitive     CEFTRIAXONE <=0.25 SENSITIVE Sensitive  CIPROFLOXACIN >=4 RESISTANT Resistant     GENTAMICIN <=1 SENSITIVE Sensitive     IMIPENEM <=0.25 SENSITIVE Sensitive     NITROFURANTOIN <=16 SENSITIVE Sensitive     TRIMETH/SULFA <=20 SENSITIVE Sensitive     AMPICILLIN/SULBACTAM 16 INTERMEDIATE Intermediate     PIP/TAZO <=4 SENSITIVE Sensitive     * >=100,000 COLONIES/mL ESCHERICHIA COLI  Resp Panel by RT-PCR (Flu A&B, Covid)     Status: None   Collection Time: 11/16/20 11:30 PM  Result Value Ref Range Status   SARS Coronavirus 2 by RT PCR NEGATIVE NEGATIVE Final    Comment: (NOTE) SARS-CoV-2 target nucleic acids are NOT DETECTED.  The SARS-CoV-2 RNA is generally detectable in upper respiratory specimens during the  acute phase of infection. The lowest concentration of SARS-CoV-2 viral copies this assay can detect is 138 copies/mL. A negative result does not preclude SARS-Cov-2 infection and should not be used as the sole basis for treatment or other patient management decisions. A negative result may occur with  improper specimen collection/handling, submission of specimen other than nasopharyngeal swab, presence of viral mutation(s) within the areas targeted by this assay, and inadequate number of viral copies(<138 copies/mL). A negative result must be combined with clinical observations, patient history, and epidemiological information. The expected result is Negative.  Fact Sheet for Patients:  EntrepreneurPulse.com.au  Fact Sheet for Healthcare Providers:  IncredibleEmployment.be  This test is no t yet approved or cleared by the [place] FDA and  has been authorized for detection and/or diagnosis of SARS-CoV-2 by FDA under an Emergency Use Authorization (EUA). This EUA will remain  in effect (meaning this test can be used) for the duration of the COVID-19 declaration under Section 564(b)(1) of the Act, 21 U.S.C.section 360bbb-3(b)(1), unless the authorization is terminated  or revoked sooner.       Influenza A by PCR NEGATIVE NEGATIVE Final   Influenza B by PCR NEGATIVE NEGATIVE Final    Comment: (NOTE) The Xpert Xpress SARS-CoV-2/FLU/RSV plus assay is intended as an aid in the diagnosis of influenza from Nasopharyngeal swab specimens and should not be used as a sole basis for treatment. Nasal washings and aspirates are unacceptable for Xpert Xpress SARS-CoV-2/FLU/RSV testing.  Fact Sheet for Patients: EntrepreneurPulse.com.au  Fact Sheet for Healthcare Providers: IncredibleEmployment.be  This test is not yet approved or cleared by the [place] FDA and has been authorized for detection and/or  diagnosis of SARS-CoV-2 by FDA under an Emergency Use Authorization (EUA). This EUA will remain in effect (meaning this test can be used) for the duration of the COVID-19 declaration under Section 564(b)(1) of the Act, 21 U.S.C. section 360bbb-3(b)(1), unless the authorization is terminated or revoked.  Performed at [facility], [place] [address], [place], [place] [address]      Labs: Basic Metabolic Panel: Recent Labs  Lab 11/16/20 1934 11/17/20 0522 11/18/20 0206 11/19/20 0448  NA 131* 133* 133* 134*  K 5.2* 4.3 3.9 4.2  CL 95* 100 100 99  CO2 $Re'27 24 25 27  'UAN$ GLUCOSE 379* 244* 173* 158*  BUN $Re'17 15 13 14  'ZNe$ CREATININE 0.69 0.58 0.53 0.67  CALCIUM 8.9 8.1* 8.4* 8.7*   Liver Function Tests: Recent Labs  Lab 11/16/20 1934  AST 11*  ALT 9  ALKPHOS 82  BILITOT 0.8  PROT 7.9  ALBUMIN 3.1*   Recent Labs  Lab 11/16/20 1934  LIPASE 27   No results for input(s): AMMONIA in the last 168 hours. CBC: Recent Labs  Lab 11/16/20  1934 11/17/20 0522 11/17/20 1411 11/18/20 0206 11/18/20 1128 11/19/20 0448  WBC 10.6* 10.4 9.3 10.7* 7.6 8.1  NEUTROABS 7.8*  --   --   --   --   --   HGB 8.9* 8.1* 8.2* 7.5* 8.3* 8.2*  HCT 28.3* 25.9* 26.9* 24.2* 26.7* 26.0*  MCV 84.7 84.6 85.9 85.2 85.0 85.2  PLT 647* 548* 583* 531* 557* 570*   Cardiac Enzymes: No results for input(s): CKTOTAL, CKMB, CKMBINDEX, TROPONINI in the last 168 hours. BNP: BNP (last 3 results) No results for input(s): BNP in the last 8760 hours.  ProBNP (last 3 results) No results for input(s): PROBNP in the last 8760 hours.  CBG: Recent Labs  Lab 11/18/20 2003 11/19/20 0001 11/19/20 0403 11/19/20 0739 11/19/20 1203  GLUCAP 261* 168* 126* 106* 151*       Signed:  Oswald Hillock MD.  [facility] 11/19/2020, 2:22 PM

## 2020-11-19 NOTE — TOC Transition Note (Signed)
Transition of Care [facility]) - CM/SW Discharge Note   Patient Details  Name: Shelby Brown MRN: [id number] Date of Birth: 1980-10-29  Transition of Care [facility]) CM/SW Contact:  Elliot Gault, LCSW Phone Number: 11/19/2020, 2:11 PM   Clinical Narrative:     TOC notified by RN that MD will potentially dc pt later today and he is requesting TOC set pt up at [facility]. Called and scheduled pt and appointment information is on AVS. [facility] working on getting an appointment sooner than was scheduled but they will call patient if they find an opening for her to come in sooner.   Pt will be on Keflex at dc for 5 days. Spoke with Pharmacist who states that generic Keflex should be inexpensive at [employer].   Updated RN and MD. No other TOC needs identified.  Expected Discharge Plan: Home/Self Care Barriers to Discharge: Barriers Resolved   Patient Goals and CMS Choice        Expected Discharge Plan and Services Expected Discharge Plan: Home/Self Care In-house Referral: Clinical Social Work Discharge Planning Services: [facility] Clinic,Follow-up appt scheduled   Living arrangements for the past 2 months: Single Family Home                                      Prior Living Arrangements/Services Living arrangements for the past 2 months: Single Family Home   Patient language and need for interpreter reviewed:: Yes        Need for Family Participation in Patient Care: No (Comment) Care giver support system in place?: Yes (comment)   Criminal Activity/Legal Involvement Pertinent to Current Situation/Hospitalization: No - Comment as needed  Activities of Daily Living Home Assistive Devices/Equipment: None ADL Screening (condition at time of admission) Patient's cognitive ability adequate to safely complete daily activities?: Yes Is the patient deaf or have difficulty hearing?: No Does the patient have difficulty seeing, even when wearing glasses/contacts?: No Does  the patient have difficulty concentrating, remembering, or making decisions?: No Patient able to express need for assistance with ADLs?: Yes Does the patient have difficulty dressing or bathing?: Yes Independently performs ADLs?: Yes (appropriate for developmental age) Does the patient have difficulty walking or climbing stairs?: Yes Weakness of Legs: Left Weakness of Arms/Hands: Left  Permission Sought/Granted                  Emotional Assessment       Orientation: : Oriented to Self,Oriented to Place,Oriented to  Time,Oriented to Situation Alcohol / Substance Use: Not Applicable Psych Involvement: No (comment)  Admission diagnosis:  Pyelonephritis [N12] Anemia, unspecified type [D64.9] Pyelonephritis of left kidney [N12] Patient Active Problem List   Diagnosis Date Noted  . Anemia 11/17/2020  . Melena 11/17/2020  . Pyelonephritis of left kidney 11/16/2020  . Lobar pneumonia (HCC) 09/08/2020  . COVID-19 virus infection 09/01/2020  . Abdominal pain, chronic, epigastric 03/12/2018  . MDD (major depressive disorder) 11/01/2017  . Intentional metformin overdose (HCC) 10/27/2017  . Adjustment disorder with mixed anxiety and depressed mood 10/27/2017  . DYSLIPIDEMIA 12/10/2007  . Diabetes type 2, uncontrolled ([facility]) 08/07/2006   PCP:  Patient, No Pcp Per Pharmacy:   [facility] [address] [place] [address] [place] [address] Phone: [phone] Fax: [phone]  [facility] - [place], [place] - [address] [place]  [address] Phone: [phone] Fax: [phone]     Social Determinants of Health (SDOH) Interventions    Readmission Risk Interventions Readmission Risk Prevention Plan 11/19/2020  Medication Screening Complete  Transportation Screening Complete  Some recent data might be hidden    Final next level of care: Home/Self Care Barriers to  Discharge: Barriers Resolved   Patient Goals and CMS Choice        Discharge Placement                       Discharge Plan and Services In-house Referral: Clinical Social Work Discharge Planning Services: [facility],Follow-up appt scheduled                                 Social Determinants of Health (SDOH) Interventions     Readmission Risk Interventions Readmission Risk Prevention Plan 11/19/2020  Medication Screening Complete  Transportation Screening Complete  Some recent data might be hidden

## 2020-12-02 ENCOUNTER — Inpatient Hospital Stay (INDEPENDENT_AMBULATORY_CARE_PROVIDER_SITE_OTHER): Payer: Self-pay | Admitting: Primary Care

## 2020-12-21 ENCOUNTER — Inpatient Hospital Stay: Payer: Self-pay | Admitting: Nurse Practitioner

## 2021-03-26 ENCOUNTER — Encounter (HOSPITAL_COMMUNITY): Payer: Self-pay

## 2021-03-26 ENCOUNTER — Other Ambulatory Visit: Payer: Self-pay

## 2021-03-26 ENCOUNTER — Inpatient Hospital Stay (HOSPITAL_COMMUNITY)
Admission: EM | Admit: 2021-03-26 | Discharge: 2021-03-29 | DRG: 638 | Disposition: A | Discharge status: Home / Self Care | Payer: Self-pay | Attending: Internal Medicine | Admitting: Internal Medicine

## 2021-03-26 DIAGNOSIS — Z8616 Personal history of COVID-19: Secondary | ICD-10-CM

## 2021-03-26 DIAGNOSIS — R109 Unspecified abdominal pain: Secondary | ICD-10-CM

## 2021-03-26 DIAGNOSIS — R490 Dysphonia: Secondary | ICD-10-CM | POA: Diagnosis present

## 2021-03-26 DIAGNOSIS — E876 Hypokalemia: Secondary | ICD-10-CM | POA: Diagnosis present

## 2021-03-26 DIAGNOSIS — T383X6A Underdosing of insulin and oral hypoglycemic [antidiabetic] drugs, initial encounter: Secondary | ICD-10-CM | POA: Diagnosis present

## 2021-03-26 DIAGNOSIS — E785 Hyperlipidemia, unspecified: Secondary | ICD-10-CM | POA: Diagnosis present

## 2021-03-26 DIAGNOSIS — E1165 Type 2 diabetes mellitus with hyperglycemia: Secondary | ICD-10-CM

## 2021-03-26 DIAGNOSIS — N179 Acute kidney failure, unspecified: Secondary | ICD-10-CM

## 2021-03-26 DIAGNOSIS — F4323 Adjustment disorder with mixed anxiety and depressed mood: Secondary | ICD-10-CM | POA: Diagnosis present

## 2021-03-26 DIAGNOSIS — Z9119 Patient's noncompliance with other medical treatment and regimen: Secondary | ICD-10-CM

## 2021-03-26 DIAGNOSIS — Z7984 Long term (current) use of oral hypoglycemic drugs: Secondary | ICD-10-CM

## 2021-03-26 DIAGNOSIS — Z833 Family history of diabetes mellitus: Secondary | ICD-10-CM

## 2021-03-26 DIAGNOSIS — E1110 Type 2 diabetes mellitus with ketoacidosis without coma: Secondary | ICD-10-CM | POA: Diagnosis present

## 2021-03-26 DIAGNOSIS — Z794 Long term (current) use of insulin: Secondary | ICD-10-CM

## 2021-03-26 DIAGNOSIS — Z20822 Contact with and (suspected) exposure to covid-19: Secondary | ICD-10-CM | POA: Diagnosis present

## 2021-03-26 DIAGNOSIS — Z79899 Other long term (current) drug therapy: Secondary | ICD-10-CM

## 2021-03-26 DIAGNOSIS — E1010 Type 1 diabetes mellitus with ketoacidosis without coma: Principal | ICD-10-CM | POA: Diagnosis present

## 2021-03-26 DIAGNOSIS — R112 Nausea with vomiting, unspecified: Secondary | ICD-10-CM

## 2021-03-26 DIAGNOSIS — E860 Dehydration: Secondary | ICD-10-CM | POA: Diagnosis present

## 2021-03-26 LAB — CBC
HCT: 43.5 % (ref 36.0–46.0)
Hemoglobin: 13.1 g/dL (ref 12.0–15.0)
MCH: 28.2 pg (ref 26.0–34.0)
MCHC: 30.1 g/dL (ref 30.0–36.0)
MCV: 93.5 fL (ref 80.0–100.0)
Platelets: 306 10*3/uL (ref 150–400)
RBC: 4.65 MIL/uL (ref 3.87–5.11)
RDW: 15.2 % (ref 11.5–15.5)
WBC: 19.6 10*3/uL — ABNORMAL HIGH (ref 4.0–10.5)
nRBC: 0 % (ref 0.0–0.2)

## 2021-03-26 LAB — BLOOD GAS, ARTERIAL
Acid-base deficit: 25.5 mmol/L — ABNORMAL HIGH (ref 0.0–2.0)
Bicarbonate: 4.2 mmol/L — ABNORMAL LOW (ref 20.0–28.0)
Drawn by: 11249
FIO2: 21
O2 Saturation: 96.6 %
Patient temperature: 98.6
pCO2 arterial: <19 mmHg — CL (ref 32.0–48.0)
pH, Arterial: 7.072 — CL (ref 7.350–7.450)
pO2, Arterial: 126 mmHg — ABNORMAL HIGH (ref 83.0–108.0)

## 2021-03-26 LAB — COMPREHENSIVE METABOLIC PANEL
ALT: 14 U/L (ref 0–44)
AST: 13 U/L — ABNORMAL LOW (ref 15–41)
Albumin: 4.1 g/dL (ref 3.5–5.0)
Alkaline Phosphatase: 80 U/L (ref 38–126)
BUN: 30 mg/dL — ABNORMAL HIGH (ref 6–20)
CO2: <7 mmol/L — ABNORMAL LOW (ref 22–32)
Calcium: 9.5 mg/dL (ref 8.9–10.3)
Chloride: 103 mmol/L (ref 98–111)
Creatinine, Ser: 1.39 mg/dL — ABNORMAL HIGH (ref 0.44–1.00)
GFR, Estimated: 49 mL/min — ABNORMAL LOW (ref >60)
Glucose, Bld: 565 mg/dL (ref 70–99)
Potassium: 5.1 mmol/L (ref 3.5–5.1)
Sodium: 137 mmol/L (ref 135–145)
Total Bilirubin: 1.9 mg/dL — ABNORMAL HIGH (ref 0.3–1.2)
Total Protein: 9 g/dL — ABNORMAL HIGH (ref 6.5–8.1)

## 2021-03-26 LAB — CBG MONITORING, ED
Glucose-Capillary: 446 mg/dL — ABNORMAL HIGH (ref 70–99)
Glucose-Capillary: 496 mg/dL — ABNORMAL HIGH (ref 70–99)

## 2021-03-26 LAB — LIPASE, BLOOD: Lipase: 30 U/L (ref 11–51)

## 2021-03-26 LAB — HCG, QUANTITATIVE, PREGNANCY: hCG, Beta Chain, Quant, S: <1 m[IU]/mL (ref <5)

## 2021-03-26 MED ORDER — SODIUM CHLORIDE 0.9 % IV BOLUS
1000.0000 mL | Freq: Once | INTRAVENOUS | Status: AC
  Administered 2021-03-26: 1000 mL via INTRAVENOUS

## 2021-03-26 MED ORDER — LACTATED RINGERS IV SOLN: INTRAVENOUS | Status: DC

## 2021-03-26 MED ORDER — FENTANYL CITRATE (PF) 100 MCG/2ML IJ SOLN
50.0000 ug | Freq: Once | INTRAMUSCULAR | Status: AC
Start: 2021-03-26 — End: 2021-03-26
  Administered 2021-03-26: 50 ug via INTRAVENOUS
  Filled 2021-03-26: qty 2

## 2021-03-26 MED ORDER — LACTATED RINGERS IV BOLUS
900.0000 mL | Freq: Once | INTRAVENOUS | Status: AC
  Administered 2021-03-26: 900 mL via INTRAVENOUS

## 2021-03-26 MED ORDER — FAMOTIDINE IN NACL 20-0.9 MG/50ML-% IV SOLN
20.0000 mg | Freq: Once | INTRAVENOUS | Status: AC
  Administered 2021-03-26: 20 mg via INTRAVENOUS
  Filled 2021-03-26: qty 50

## 2021-03-26 MED ORDER — DEXTROSE 50 % IV SOLN: 0.0000 mL | INTRAVENOUS | Status: DC | PRN

## 2021-03-26 MED ORDER — PANTOPRAZOLE SODIUM 40 MG IV SOLR
40.0000 mg | Freq: Once | INTRAVENOUS | Status: AC
  Administered 2021-03-26: 40 mg via INTRAVENOUS
  Filled 2021-03-26: qty 40

## 2021-03-26 MED ORDER — INSULIN REGULAR(HUMAN) IN NACL 100-0.9 UT/100ML-% IV SOLN
INTRAVENOUS | Status: DC
  Administered 2021-03-26: 4.8 [IU]/h via INTRAVENOUS
  Administered 2021-03-28: 2.4 [IU]/h via INTRAVENOUS
  Filled 2021-03-26 (×2): qty 100

## 2021-03-26 MED ORDER — ONDANSETRON HCL 4 MG/2ML IJ SOLN
4.0000 mg | Freq: Once | INTRAMUSCULAR | Status: AC
  Administered 2021-03-26: 4 mg via INTRAVENOUS
  Filled 2021-03-26: qty 2

## 2021-03-26 MED ORDER — DEXTROSE IN LACTATED RINGERS 5 % IV SOLN
INTRAVENOUS | Status: DC
  Administered 2021-03-27: 125 mL/h via INTRAVENOUS

## 2021-03-26 NOTE — ED Triage Notes (Signed)
Pt reports abdominal pain and N/V/D that has progressively worse. Pt is vomiting in triage at this time.

## 2021-03-26 NOTE — ED Provider Notes (Signed)
[facility] DEPT Provider Note   CSN: [id number] Arrival date & time: 03/26/21  2117     History Chief Complaint  Patient presents with   Abdominal Pain   Emesis    Shelby Brown is a 40 y.o. female.  40 y/o female with hx of IDDM presents to the emergency department for complaints of abdominal pain with nausea and vomiting.  Her symptoms have been ongoing x2 days.  Abdominal pain begins in her suprapubic abdomen and radiates towards her left lower quadrant, into her back, into her epigastrium.  Her emesis has been persistent with too numerous to count episodes.  Reports her emesis being black in color.  She developed diarrhea today which is watery.  Also endorsing urinary frequency without dysuria.  She has been out of her insulin x2 weeks.  Feels "hot", but denies known fever.  Abdominal surgical history significant for C-section and tubal ligation.  The history is provided by the patient. A language interpreter was used.  Abdominal Pain Associated symptoms: vomiting   Emesis Associated symptoms: abdominal pain       Past Medical History:  Diagnosis Date   Diabetes mellitus without complication [facility])     Patient Active Problem List   Diagnosis Date Noted   DKA (diabetic ketoacidosis) ([place]) 03/27/2021   Anemia 11/17/2020   Melena 11/17/2020   Pyelonephritis of left kidney 11/16/2020   Lobar pneumonia ([place]) 09/08/2020   COVID-19 virus infection 09/01/2020   Abdominal pain, chronic, epigastric 03/12/2018   MDD (major depressive disorder) 11/01/2017   Intentional metformin overdose ([place]) 10/27/2017   Adjustment disorder with mixed anxiety and depressed mood 10/27/2017   DYSLIPIDEMIA 12/10/2007   Diabetes type 2, uncontrolled ([place]) 08/07/2006    Past Surgical History:  Procedure Laterality Date   CESAREAN SECTION     TUBAL LIGATION       OB History   No obstetric history on file.     Family History  Problem Relation Age of  Onset   Cancer Father    Diabetes Sister    Diabetes Sister     Social History   Tobacco Use   Smoking status: Never   Smokeless tobacco: Never  Substance Use Topics   Alcohol use: No   Drug use: No    Home Medications Prior to Admission medications   Medication Sig Start Date End Date Taking? Authorizing Provider  blood glucose meter kit and supplies KIT Dispense based on patient and insurance preference. Use up to four times daily as directed. (FOR ICD-9 250.00, 250.01). 09/13/20   Little Ishikawa, MD  ferrous gluconate (FERGON) 324 MG tablet Take 1 tablet (324 mg total) by mouth daily with breakfast. 11/20/20   Oswald Hillock, MD  HYDROcodone-acetaminophen (NORCO/VICODIN) 5-325 MG tablet Take 1 tablet by mouth every 6 (six) hours as needed for moderate pain. 11/19/20   Oswald Hillock, MD  insulin NPH-regular Human (NOVOLIN 70/30 RELION) (70-30) 100 UNIT/ML injection Inject 10 Units into the skin 2 (two) times daily with a meal. 11/19/20   Oswald Hillock, MD  Insulin Syringe-Needle U-100 30G X 1/2" 0.5 ML MISC USE AS DIRECTED IN THE MORNING AND AT BEDTIME 09/13/20 09/13/21  Little Ishikawa, MD  Insulin Syringes, Disposable, U-100 0.5 ML MISC 1 Package by Does not apply route in the morning and at bedtime. 09/13/20   Little Ishikawa, MD  metFORMIN (GLUCOPHAGE) 1000 MG tablet Take 1 tablet (1,000 mg total) by mouth 2 (two) times daily  with a meal. 09/13/20   Little Ishikawa, MD  ondansetron (ZOFRAN) 4 MG tablet Take 1 tablet (4 mg total) by mouth every 8 (eight) hours as needed for nausea or vomiting. 11/19/20 11/19/21  Oswald Hillock, MD  promethazine (PHENERGAN) 6.25 MG/5ML syrup Take 5 mLs (6.25 mg total) by mouth 4 (four) times daily as needed for nausea or vomiting. Patient not taking: Reported on 11/16/2020 09/13/20 11/19/20  Little Ishikawa, MD    Allergies    Patient has no known allergies.  Review of Systems   Review of Systems  Gastrointestinal:  Positive for  abdominal pain and vomiting.  Ten systems reviewed and are negative for acute change, except as noted in the HPI.    Physical Exam Updated Vital Signs BP 95/62   Pulse 100   Temp 98.2 F (36.8 C)   Resp 19   SpO2 100%   Physical Exam Vitals and nursing note reviewed.  Constitutional:      General: She is not in acute distress.    Appearance: She is well-developed. She is not diaphoretic.     Comments: Appears ill and uncomfortable.  Persistent vomiting.  HENT:     Head: Normocephalic and atraumatic.     Mouth/Throat:     Mouth: Mucous membranes are dry.  Eyes:     General: No scleral icterus.    Conjunctiva/sclera: Conjunctivae normal.  Cardiovascular:     Rate and Rhythm: Regular rhythm. Tachycardia present.     Pulses: Normal pulses.  Pulmonary:     Effort: Pulmonary effort is normal. No respiratory distress.     Breath sounds: No stridor. No wheezing.     Comments: Lungs clear to auscultation bilaterally. Abdominal:     Palpations: Abdomen is soft. There is no mass.     Tenderness: There is no guarding.     Comments: Generalized abdominal tenderness without focality.  No peritoneal signs or abdominal distention.  Musculoskeletal:        General: Normal range of motion.     Cervical back: Normal range of motion.  Skin:    General: Skin is warm and dry.     Coloration: Skin is not pale.     Findings: No erythema or rash.  Neurological:     Mental Status: She is alert and oriented to person, place, and time.     Coordination: Coordination normal.  Psychiatric:        Behavior: Behavior normal.    ED Results / Procedures / Treatments   Labs (all labs ordered are listed, but only abnormal results are displayed) Labs Reviewed  COMPREHENSIVE METABOLIC PANEL - Abnormal; Notable for the following components:      Result Value   CO2 <7 (*)    Glucose, Bld 565 (*)    BUN 30 (*)    Creatinine, Ser 1.39 (*)    Total Protein 9.0 (*)    AST 13 (*)    Total Bilirubin  1.9 (*)    GFR, Estimated 49 (*)    All other components within normal limits  CBC - Abnormal; Notable for the following components:   WBC 19.6 (*)    All other components within normal limits  BLOOD GAS, ARTERIAL - Abnormal; Notable for the following components:   pH, Arterial 7.072 (*)    pCO2 arterial <19.0 (*)    pO2, Arterial 126 (*)    Bicarbonate 4.2 (*)    Acid-base deficit 25.5 (*)    All other  components within normal limits  BETA-HYDROXYBUTYRIC ACID - Abnormal; Notable for the following components:   Beta-Hydroxybutyric Acid >8.00 (*)    All other components within normal limits  CBG MONITORING, ED - Abnormal; Notable for the following components:   Glucose-Capillary 496 (*)    All other components within normal limits  CBG MONITORING, ED - Abnormal; Notable for the following components:   Glucose-Capillary 446 (*)    All other components within normal limits  URINE CULTURE  SARS CORONAVIRUS 2 (TAT 6-24 HRS)  LIPASE, BLOOD  HCG, QUANTITATIVE, PREGNANCY  URINALYSIS, ROUTINE W REFLEX MICROSCOPIC    EKG None  Radiology No results found.  Procedures .Critical Care  Date/Time: 03/26/2021 11:26 PM Performed by: Antonietta Breach, PA-C Authorized by: Antonietta Breach, PA-C   Critical care provider statement:    Critical care time (minutes):  60   Critical care was necessary to treat or prevent imminent or life-threatening deterioration of the following conditions:  Endocrine crisis   Critical care was time spent personally by me on the following activities:  Discussions with consultants, evaluation of patient's response to treatment, examination of patient, ordering and performing treatments and interventions, ordering and review of laboratory studies, ordering and review of radiographic studies, pulse oximetry, re-evaluation of patient's condition, obtaining history from patient or surrogate and review of old charts   Medications Ordered in ED Medications  insulin regular,  human (MYXREDLIN) 100 units/ 100 mL infusion (4.6 Units/hr Intravenous Rate/Dose Change 03/27/21 0027)  lactated ringers infusion ( Intravenous New Bag/Given 03/26/21 2355)  dextrose 5 % in lactated ringers infusion (has no administration in time range)  dextrose 50 % solution 0-50 mL (has no administration in time range)  sodium chloride 0.9 % bolus 1,000 mL (0 mLs Intravenous Stopped 03/26/21 2355)  ondansetron (ZOFRAN) injection 4 mg (4 mg Intravenous Given 03/26/21 2254)  famotidine (PEPCID) IVPB 20 mg premix (0 mg Intravenous Stopped 03/26/21 2355)  pantoprazole (PROTONIX) injection 40 mg (40 mg Intravenous Given 03/26/21 2254)  fentaNYL (SUBLIMAZE) injection 50 mcg (50 mcg Intravenous Given 03/26/21 2255)  lactated ringers bolus 900 mL (900 mLs Intravenous New Bag/Given 03/26/21 2355)    ED Course  I have reviewed the triage vital signs and the nursing notes.  Pertinent labs & imaging results that were available during my care of the patient were reviewed by me and considered in my medical decision making (see chart for details).  Clinical Course as of 03/27/21 0041  Sat Mar 26, 2021  2344 Calculated anion gap of 30. pH 7.072. [KH]  Sun Mar 27, 2021  0040 Patient appears to be improving symptomatically.  She is more comfortable on repeat assessment.  Understands the need for admission for management of DKA. [KH]    Clinical Course User Index [KH] Antonietta Breach, PA-C   MDM Rules/Calculators/A&P                          40 year old female presenting for abdominal pain, nausea, vomiting.  She is a type I diabetic and ran out of her insulin 2 weeks ago.  Evaluation today is consistent with diabetic ketoacidosis.  She was having some polyuria which is suspected secondary to her DKA, though with complaints of suprapubic discomfort will necessitate rule out of UTI.  Urinalysis is presently pending.  She has been started on IV fluids as well as IV insulin.  Will be admitted by the hospitalist service, Dr.  Marlowe Sax.   Final Clinical Impression(s) /  ED Diagnoses Final diagnoses:  Diabetic ketoacidosis without coma associated with type 1 diabetes mellitus ([place])  AKI (acute kidney injury) [facility])    Rx / DC Orders ED Discharge Orders     None        Antonietta Breach, PA-C 03/27/21 Weiser, April, MD 03/27/21 0479

## 2021-03-27 DIAGNOSIS — E1110 Type 2 diabetes mellitus with ketoacidosis without coma: Secondary | ICD-10-CM | POA: Diagnosis present

## 2021-03-27 DIAGNOSIS — N179 Acute kidney failure, unspecified: Secondary | ICD-10-CM

## 2021-03-27 DIAGNOSIS — R112 Nausea with vomiting, unspecified: Secondary | ICD-10-CM

## 2021-03-27 DIAGNOSIS — R109 Unspecified abdominal pain: Secondary | ICD-10-CM

## 2021-03-27 HISTORY — DX: Acute kidney failure, unspecified: N17.9

## 2021-03-27 HISTORY — DX: Type 2 diabetes mellitus with ketoacidosis without coma: E11.10

## 2021-03-27 LAB — CBG MONITORING, ED
Glucose-Capillary: 174 mg/dL — ABNORMAL HIGH (ref 70–99)
Glucose-Capillary: 185 mg/dL — ABNORMAL HIGH (ref 70–99)
Glucose-Capillary: 190 mg/dL — ABNORMAL HIGH (ref 70–99)
Glucose-Capillary: 196 mg/dL — ABNORMAL HIGH (ref 70–99)
Glucose-Capillary: 206 mg/dL — ABNORMAL HIGH (ref 70–99)
Glucose-Capillary: 208 mg/dL — ABNORMAL HIGH (ref 70–99)
Glucose-Capillary: 242 mg/dL — ABNORMAL HIGH (ref 70–99)
Glucose-Capillary: 249 mg/dL — ABNORMAL HIGH (ref 70–99)
Glucose-Capillary: 261 mg/dL — ABNORMAL HIGH (ref 70–99)
Glucose-Capillary: 268 mg/dL — ABNORMAL HIGH (ref 70–99)
Glucose-Capillary: 282 mg/dL — ABNORMAL HIGH (ref 70–99)
Glucose-Capillary: 289 mg/dL — ABNORMAL HIGH (ref 70–99)
Glucose-Capillary: 353 mg/dL — ABNORMAL HIGH (ref 70–99)
Glucose-Capillary: 387 mg/dL — ABNORMAL HIGH (ref 70–99)
Glucose-Capillary: 429 mg/dL — ABNORMAL HIGH (ref 70–99)

## 2021-03-27 LAB — PHOSPHORUS: Phosphorus: 1.7 mg/dL — ABNORMAL LOW (ref 2.5–4.6)

## 2021-03-27 LAB — URINALYSIS, ROUTINE W REFLEX MICROSCOPIC
Bilirubin Urine: NEGATIVE
Glucose, UA: >=500 mg/dL — AB
Ketones, ur: 80 mg/dL — AB
Leukocytes,Ua: NEGATIVE
Nitrite: NEGATIVE
Protein, ur: 30 mg/dL — AB
Specific Gravity, Urine: 1.017 (ref 1.005–1.030)
pH: 5 (ref 5.0–8.0)

## 2021-03-27 LAB — BASIC METABOLIC PANEL
Anion gap: 19 — ABNORMAL HIGH (ref 5–15)
Anion gap: 10 (ref 5–15)
Anion gap: 7 (ref 5–15)
BUN: 29 mg/dL — ABNORMAL HIGH (ref 6–20)
BUN: 28 mg/dL — ABNORMAL HIGH (ref 6–20)
BUN: 25 mg/dL — ABNORMAL HIGH (ref 6–20)
CO2: 10 mmol/L — ABNORMAL LOW (ref 22–32)
CO2: 16 mmol/L — ABNORMAL LOW (ref 22–32)
CO2: 18 mmol/L — ABNORMAL LOW (ref 22–32)
Calcium: 8.7 mg/dL — ABNORMAL LOW (ref 8.9–10.3)
Calcium: 8.7 mg/dL — ABNORMAL LOW (ref 8.9–10.3)
Calcium: 8.7 mg/dL — ABNORMAL LOW (ref 8.9–10.3)
Chloride: 114 mmol/L — ABNORMAL HIGH (ref 98–111)
Chloride: 118 mmol/L — ABNORMAL HIGH (ref 98–111)
Chloride: 119 mmol/L — ABNORMAL HIGH (ref 98–111)
Creatinine, Ser: 1.18 mg/dL — ABNORMAL HIGH (ref 0.44–1.00)
Creatinine, Ser: 1.01 mg/dL — ABNORMAL HIGH (ref 0.44–1.00)
Creatinine, Ser: 0.81 mg/dL (ref 0.44–1.00)
GFR, Estimated: 60 mL/min — ABNORMAL LOW (ref >60)
GFR, Estimated: >60 mL/min (ref >60)
GFR, Estimated: >60 mL/min (ref >60)
Glucose, Bld: 373 mg/dL — ABNORMAL HIGH (ref 70–99)
Glucose, Bld: 270 mg/dL — ABNORMAL HIGH (ref 70–99)
Glucose, Bld: 267 mg/dL — ABNORMAL HIGH (ref 70–99)
Potassium: 4.2 mmol/L (ref 3.5–5.1)
Potassium: 3.8 mmol/L (ref 3.5–5.1)
Potassium: 3.6 mmol/L (ref 3.5–5.1)
Sodium: 143 mmol/L (ref 135–145)
Sodium: 144 mmol/L (ref 135–145)
Sodium: 144 mmol/L (ref 135–145)

## 2021-03-27 LAB — CBC
HCT: 34 % — ABNORMAL LOW (ref 36.0–46.0)
Hemoglobin: 10.7 g/dL — ABNORMAL LOW (ref 12.0–15.0)
MCH: 28.2 pg (ref 26.0–34.0)
MCHC: 31.5 g/dL (ref 30.0–36.0)
MCV: 89.5 fL (ref 80.0–100.0)
Platelets: 276 10*3/uL (ref 150–400)
RBC: 3.8 MIL/uL — ABNORMAL LOW (ref 3.87–5.11)
RDW: 15.3 % (ref 11.5–15.5)
WBC: 17.1 10*3/uL — ABNORMAL HIGH (ref 4.0–10.5)
nRBC: 0 % (ref 0.0–0.2)

## 2021-03-27 LAB — HEMOGLOBIN A1C
Hgb A1c MFr Bld: 12.8 % — ABNORMAL HIGH (ref 4.8–5.6)
Mean Plasma Glucose: 320.66 mg/dL

## 2021-03-27 LAB — MAGNESIUM: Magnesium: 2.2 mg/dL (ref 1.7–2.4)

## 2021-03-27 LAB — RESP PANEL BY RT-PCR (FLU A&B, COVID) ARPGX2
Influenza A by PCR: NEGATIVE
Influenza B by PCR: NEGATIVE
SARS Coronavirus 2 by RT PCR: NEGATIVE

## 2021-03-27 LAB — BETA-HYDROXYBUTYRIC ACID: Beta-Hydroxybutyric Acid: >8 mmol/L — ABNORMAL HIGH (ref 0.05–0.27)

## 2021-03-27 MED ORDER — ENOXAPARIN SODIUM 30 MG/0.3ML IJ SOSY
30.0000 mg | PREFILLED_SYRINGE | INTRAMUSCULAR | Status: DC
  Administered 2021-03-27: 30 mg via SUBCUTANEOUS
  Filled 2021-03-27: qty 0.3

## 2021-03-27 MED ORDER — SODIUM CHLORIDE 0.9 % IV SOLN
12.5000 mg | Freq: Four times a day (QID) | INTRAVENOUS | Status: DC | PRN
  Administered 2021-03-27 – 2021-03-28 (×3): 12.5 mg via INTRAVENOUS
  Filled 2021-03-27 (×3): qty 12.5
  Filled 2021-03-27: qty 0.5

## 2021-03-27 MED ORDER — FENTANYL CITRATE (PF) 100 MCG/2ML IJ SOLN
25.0000 ug | INTRAMUSCULAR | Status: DC | PRN
  Administered 2021-03-27 – 2021-03-28 (×3): 25 ug via INTRAVENOUS
  Filled 2021-03-27 (×3): qty 2

## 2021-03-27 MED ORDER — ACETAMINOPHEN 650 MG RE SUPP: 650.0000 mg | Freq: Four times a day (QID) | RECTAL | Status: DC | PRN

## 2021-03-27 MED ORDER — POTASSIUM PHOSPHATES 15 MMOLE/5ML IV SOLN
20.0000 mmol | Freq: Once | INTRAVENOUS | Status: AC
  Administered 2021-03-27: 20 mmol via INTRAVENOUS
  Filled 2021-03-27: qty 6.67

## 2021-03-27 MED ORDER — PANTOPRAZOLE SODIUM 40 MG IV SOLR
40.0000 mg | Freq: Two times a day (BID) | INTRAVENOUS | Status: DC
  Administered 2021-03-27 – 2021-03-29 (×5): 40 mg via INTRAVENOUS
  Filled 2021-03-27 (×5): qty 40

## 2021-03-27 MED ORDER — ONDANSETRON HCL 4 MG/2ML IJ SOLN
4.0000 mg | Freq: Four times a day (QID) | INTRAMUSCULAR | Status: DC | PRN
  Administered 2021-03-27 – 2021-03-28 (×4): 4 mg via INTRAVENOUS
  Filled 2021-03-27 (×4): qty 2

## 2021-03-27 MED ORDER — ACETAMINOPHEN 325 MG PO TABS: 650.0000 mg | ORAL_TABLET | Freq: Four times a day (QID) | ORAL | Status: DC | PRN

## 2021-03-27 NOTE — H&P (Signed)
History and Physical    Shelby Brown LKG:[id number] DOB: May 18, 1981 DOA: 03/26/2021  PCP: Patient, No Pcp Per (Inactive) Patient coming from: Home  Chief Complaint: Abdominal pain, nausea, vomiting  HPI: Asusena Brown is a 40 y.o. female with medical history significant of uncontrolled insulin-dependent type 2 diabetes (A1c 11.8 on 11/17/2020), iron deficiency anemia, COVID infection in December 2021, pyelonephritis in March 2022 presented to the ED with complaints of abdominal pain, nausea, and vomiting.  She ran out of her home insulin 2 weeks ago.  In the ED, mildly tachycardic and blood pressure soft.  Labs showing WBC 19.6, hemoglobin 13.1, platelet count 306K.  Sodium 137, potassium 5.1, chloride 103, bicarb <7, anion gap unmeasurable, BUN 30, creatinine 1.3 (baseline 0.4-0.6), glucose 565.  T bili slightly elevated at 1.9, no elevation of remainder of LFTs.  Lipase normal.  UA pending.  Beta hydroxybutyric acid significantly elevated >8.0.  ABG with pH 7.0.  Beta hCG negative.  COVID and influenza PCR pending. Patient was started on IV insulin and IV fluids (2 L boluses given). She was also given IV Pepcid, IV Protonix, Zofran, and fentanyl.  Patient speaks Spanish.  Language interpretation done by husband at bedside who speaks [place].  Patient complaining of generalized abdominal pain, nausea, vomiting, and diarrhea x1 day.  Also endorsing dysuria.  She finished her home insulin 2 weeks ago and decided not to get a refill as it does not make her feel good.  Husband states she is not able to tolerate insulin as it makes her vomit but is able to tolerate pills.  Unclear whether she is vaccinated against COVID.  Review of Systems:  All systems reviewed and apart from history of presenting illness, are negative.  Past Medical History:  Diagnosis Date   Diabetes mellitus without complication ([place])     Past Surgical History:  Procedure Laterality Date   CESAREAN SECTION      TUBAL LIGATION       reports that she has never smoked. She has never used smokeless tobacco. She reports that she does not drink alcohol and does not use drugs.  No Known Allergies  Family History  Problem Relation Age of Onset   Cancer Father    Diabetes Sister    Diabetes Sister     Prior to Admission medications   Medication Sig Start Date End Date Taking? Authorizing Provider  blood glucose meter kit and supplies KIT Dispense based on patient and insurance preference. Use up to four times daily as directed. (FOR ICD-9 250.00, 250.01). 09/13/20   Little Ishikawa, MD  ferrous gluconate (FERGON) 324 MG tablet Take 1 tablet (324 mg total) by mouth daily with breakfast. 11/20/20   Oswald Hillock, MD  HYDROcodone-acetaminophen (NORCO/VICODIN) 5-325 MG tablet Take 1 tablet by mouth every 6 (six) hours as needed for moderate pain. 11/19/20   Oswald Hillock, MD  insulin NPH-regular Human (NOVOLIN 70/30 RELION) (70-30) 100 UNIT/ML injection Inject 10 Units into the skin 2 (two) times daily with a meal. 11/19/20   Oswald Hillock, MD  Insulin Syringe-Needle U-100 30G X 1/2" 0.5 ML MISC USE AS DIRECTED IN THE MORNING AND AT BEDTIME 09/13/20 09/13/21  Little Ishikawa, MD  Insulin Syringes, Disposable, U-100 0.5 ML MISC 1 Package by Does not apply route in the morning and at bedtime. 09/13/20   Little Ishikawa, MD  metFORMIN (GLUCOPHAGE) 1000 MG tablet Take 1 tablet (1,000 mg total) by mouth 2 (two) times daily with a  meal. 09/13/20   Little Ishikawa, MD  ondansetron (ZOFRAN) 4 MG tablet Take 1 tablet (4 mg total) by mouth every 8 (eight) hours as needed for nausea or vomiting. 11/19/20 11/19/21  Oswald Hillock, MD  promethazine (PHENERGAN) 6.25 MG/5ML syrup Take 5 mLs (6.25 mg total) by mouth 4 (four) times daily as needed for nausea or vomiting. Patient not taking: Reported on 11/16/2020 09/13/20 11/19/20  Little Ishikawa, MD    Physical Exam: Vitals:   03/27/21 0000 03/27/21 0045  03/27/21 0100 03/27/21 0200  BP: 104/70 108/71 100/69 103/68  Pulse: (!) 102 (!) 103 98 95  Resp: _0 Temp:      TempSrc:      SpO2: 100% 100% 99% 100%    Physical Exam Constitutional:      General: She is not in acute distress. HENT:     Head: Normocephalic and atraumatic.     Mouth/Throat:     Mouth: Mucous membranes are dry.  Eyes:     Extraocular Movements: Extraocular movements intact.     Conjunctiva/sclera: Conjunctivae normal.  Cardiovascular:     Rate and Rhythm: Normal rate and regular rhythm.     Pulses: Normal pulses.  Pulmonary:     Effort: Pulmonary effort is normal. No respiratory distress.     Breath sounds: Normal breath sounds. No wheezing or rales.  Abdominal:     General: Bowel sounds are normal. There is no distension.     Palpations: Abdomen is soft.     Tenderness: There is no guarding or rebound.     Comments: Suprapubic tenderness  Musculoskeletal:        General: No swelling or tenderness.     Cervical back: Normal range of motion and neck supple.     Comments: No CVA tenderness  Skin:    General: Skin is warm and dry.  Neurological:     General: No focal deficit present.     Mental Status: She is alert and oriented to person, place, and time.     Labs on Admission: I have personally reviewed following labs and imaging studies  CBC: Recent Labs  Lab 03/26/21 2143  WBC 19.6*  HGB 13.1  HCT 43.5  MCV 93.5  PLT 888   Basic Metabolic Panel: Recent Labs  Lab 03/26/21 2143  NA 137  K 5.1  CL 103  CO2 <7*  GLUCOSE 565*  BUN 30*  CREATININE 1.39*  CALCIUM 9.5   GFR: CrCl cannot be calculated (Unknown ideal weight.). Liver Function Tests: Recent Labs  Lab 03/26/21 2143  AST 13*  ALT 14  ALKPHOS 80  BILITOT 1.9*  PROT 9.0*  ALBUMIN 4.1   Recent Labs  Lab 03/26/21 2143  LIPASE 30   No results for input(s): AMMONIA in the last 168 hours. Coagulation Profile: No results for input(s): INR, PROTIME in the last  168 hours. Cardiac Enzymes: No results for input(s): CKTOTAL, CKMB, CKMBINDEX, TROPONINI in the last 168 hours. BNP (last 3 results) No results for input(s): PROBNP in the last 8760 hours. HbA1C: No results for input(s): HGBA1C in the last 72 hours. CBG: Recent Labs  Lab 03/26/21 2252 03/26/21 2348 03/27/21 0026 03/27/21 0126 03/27/21 0157  GLUCAP 496* 446* 387* 429* 353*   Lipid Profile: No results for input(s): CHOL, HDL, LDLCALC, TRIG, CHOLHDL, LDLDIRECT in the last 72 hours. Thyroid Function Tests: No results for input(s): TSH, T4TOTAL, FREET4, T3FREE, THYROIDAB in the last 72 hours. Anemia Panel:  No results for input(s): VITAMINB12, FOLATE, FERRITIN, TIBC, IRON, RETICCTPCT in the last 72 hours. Urine analysis:    Component Value Date/Time   COLORURINE YELLOW 11/16/2020 1934   APPEARANCEUR HAZY (A) 11/16/2020 1934   LABSPEC 1.025 11/16/2020 1934   PHURINE 5.0 11/16/2020 1934   GLUCOSEU >=500 (A) 11/16/2020 1934   GLUCOSEU > 1000 mg/dL (A) 12/10/2007 2144   HGBUR MODERATE (A) 11/16/2020 1934   BILIRUBINUR NEGATIVE 11/16/2020 1934   BILIRUBINUR small (A) 08/06/2017 1044   KETONESUR 20 (A) 11/16/2020 1934   PROTEINUR NEGATIVE 11/16/2020 1934   UROBILINOGEN 0.2 08/06/2017 1044   UROBILINOGEN 0.2 11/28/2014 1725   NITRITE NEGATIVE 11/16/2020 1934   LEUKOCYTESUR MODERATE (A) 11/16/2020 1934    Radiological Exams on Admission: No results found.  Assessment/Plan Principal Problem:   DKA (diabetic ketoacidosis) (HCC) Active Problems:   Nausea & vomiting   Abdominal pain   AKI (acute kidney injury) ([place])   Severe DKA Due to noncompliance, stopped taking insulin 2 weeks ago.  A1c 11.8 on 11/17/2020.  Labs showing glucose 565, bicarb <7, anion gap unmeasurable, beta hydroxybutyric acid significantly elevated >8.0.  ABG with pH 7.0. -Continue IV insulin and IV fluids per DKA protocol.  Frequent CBG checks per Endo tool.  Serial BMP every 4 hours.  Repeat A1c.  Keep n.p.o.  Start bicarb infusion if no improvement on repeat labs.  Transition to diet and subcutaneous insulin after DKA resolves.  Abdominal pain, nausea, vomiting, diarrhea Nausea and vomiting likely related to DKA.  T bili slightly elevated at 1.9, no elevation of remainder of LFTs.  Lipase normal.  Beta-hCG negative.  Could have a UTI as she has leukocytosis on labs, slight suprapubic tenderness on exam, and endorsing dysuria.  Did have pyelonephritis a few months ago but not endorsing any flank pain and has no CVA tenderness on exam.  Unclear whether she is vaccinated against COVID, ?viral gastroenteritis. -Urinalysis pending, start antibiotic if it shows signs of infection.  Continue antiemetic as needed (EKG ordered to check QT interval).  Repeat labs in the morning to check WBC count.  COVID and influenza PCR pending.  GI pathogen panel ordered given complaint of diarrhea although no episodes since she has been in the ED.  AKI Likely prerenal from dehydration. BUN 30, creatinine 1.3 (baseline 0.4-0.6). -IV fluid hydration and monitor renal function.  Avoid nephrotoxic agents.  DVT prophylaxis: Lovenox Code Status: Full code Family Communication: No family available at this time. Disposition Plan: Status is: Observation  The patient remains OBS appropriate and will d/c before 2 midnights.  Dispo: The patient is from: Home              Anticipated d/c is to: Home              Patient currently is not medically stable to d/c.   Difficult to place patient No  Level of care: Level of care: Progressive  The medical decision making on this patient was of high complexity and the patient is at high risk for clinical deterioration, therefore this is a level 3 visit.  Shela Leff MD [facility]  If 7PM-7AM, please contact night-coverage [email or web address]  03/27/2021, 2:02 AM

## 2021-03-27 NOTE — Progress Notes (Signed)
Patient seen and examined.  Admitted early morning hours by nighttime hospitalist.  Insulin-dependent diabetes with known A1c of more than 10, noncompliance to insulin.  Presented with nausea vomiting, epigastric pain and extreme weakness after not taking insulin for about 1 week.  I used a video interpreter to counsel patient, husband at the bedside.  Patient is on insulin for the last 19 years, however she does not take it consistently because the smell of insulin makes her vomiting.  Patient currently DKA, actively vomiting.  Will keep on IV insulin protocol until she is able to eat by mouth. Replace electrolytes. Will need extensive counseling for use of insulin once ready for discharge.

## 2021-03-27 NOTE — ED Notes (Signed)
MD at bedside. Water given to pt.

## 2021-03-28 LAB — MAGNESIUM: Magnesium: 2 mg/dL (ref 1.7–2.4)

## 2021-03-28 LAB — GLUCOSE, CAPILLARY
Glucose-Capillary: 136 mg/dL — ABNORMAL HIGH (ref 70–99)
Glucose-Capillary: 179 mg/dL — ABNORMAL HIGH (ref 70–99)
Glucose-Capillary: 172 mg/dL — ABNORMAL HIGH (ref 70–99)
Glucose-Capillary: 132 mg/dL — ABNORMAL HIGH (ref 70–99)
Glucose-Capillary: 158 mg/dL — ABNORMAL HIGH (ref 70–99)
Glucose-Capillary: 80 mg/dL (ref 70–99)
Glucose-Capillary: 111 mg/dL — ABNORMAL HIGH (ref 70–99)

## 2021-03-28 LAB — PHOSPHORUS: Phosphorus: 1.4 mg/dL — ABNORMAL LOW (ref 2.5–4.6)

## 2021-03-28 LAB — CBG MONITORING, ED
Glucose-Capillary: 169 mg/dL — ABNORMAL HIGH (ref 70–99)
Glucose-Capillary: 157 mg/dL — ABNORMAL HIGH (ref 70–99)
Glucose-Capillary: 217 mg/dL — ABNORMAL HIGH (ref 70–99)
Glucose-Capillary: 188 mg/dL — ABNORMAL HIGH (ref 70–99)
Glucose-Capillary: 232 mg/dL — ABNORMAL HIGH (ref 70–99)
Glucose-Capillary: 197 mg/dL — ABNORMAL HIGH (ref 70–99)
Glucose-Capillary: 192 mg/dL — ABNORMAL HIGH (ref 70–99)
Glucose-Capillary: 166 mg/dL — ABNORMAL HIGH (ref 70–99)

## 2021-03-28 LAB — URINE CULTURE

## 2021-03-28 LAB — MRSA NEXT GEN BY PCR, NASAL: MRSA by PCR Next Gen: NOT DETECTED

## 2021-03-28 MED ORDER — INSULIN ASPART 100 UNIT/ML IJ SOLN
0.0000 [IU] | INTRAMUSCULAR | Status: DC
  Administered 2021-03-28: 2 [IU] via SUBCUTANEOUS
  Administered 2021-03-29: 3 [IU] via SUBCUTANEOUS
  Administered 2021-03-29: 1 [IU] via SUBCUTANEOUS
  Administered 2021-03-29: 2 [IU] via SUBCUTANEOUS

## 2021-03-28 MED ORDER — POTASSIUM PHOSPHATES 15 MMOLE/5ML IV SOLN
30.0000 mmol | Freq: Once | INTRAVENOUS | Status: AC
  Administered 2021-03-28: 30 mmol via INTRAVENOUS
  Filled 2021-03-28: qty 10

## 2021-03-28 MED ORDER — INSULIN DETEMIR 100 UNIT/ML ~~LOC~~ SOLN
10.0000 [IU] | Freq: Two times a day (BID) | SUBCUTANEOUS | Status: DC
  Administered 2021-03-28 – 2021-03-29 (×3): 10 [IU] via SUBCUTANEOUS
  Filled 2021-03-28 (×4): qty 0.1

## 2021-03-28 MED ORDER — ENOXAPARIN SODIUM 40 MG/0.4ML IJ SOSY
40.0000 mg | PREFILLED_SYRINGE | INTRAMUSCULAR | Status: DC
  Administered 2021-03-28 – 2021-03-29 (×2): 40 mg via SUBCUTANEOUS
  Filled 2021-03-28 (×2): qty 0.4

## 2021-03-28 MED ORDER — METOCLOPRAMIDE HCL 5 MG/ML IJ SOLN
5.0000 mg | Freq: Four times a day (QID) | INTRAMUSCULAR | Status: DC
  Administered 2021-03-28 – 2021-03-29 (×4): 5 mg via INTRAVENOUS
  Filled 2021-03-28 (×4): qty 2

## 2021-03-28 MED ORDER — CHLORHEXIDINE GLUCONATE CLOTH 2 % EX PADS
6.0000 | MEDICATED_PAD | Freq: Every day | CUTANEOUS | Status: DC
  Administered 2021-03-28 – 2021-03-29 (×2): 6 via TOPICAL

## 2021-03-28 NOTE — ED Notes (Signed)
Pt noted to be vomiting.

## 2021-03-28 NOTE — ED Notes (Signed)
This RN called and gave verbal report to admitting nurse.

## 2021-03-28 NOTE — Progress Notes (Signed)
Inpatient Diabetes Program Recommendations  AACE/ADA: New Consensus Statement on Inpatient Glycemic Control (2015)  Target Ranges:  Prepandial:   less than 140 mg/dL      Peak postprandial:   less than 180 mg/dL (1-2 hours)      Critically ill patients:  140 - 180 mg/dL   Lab Results  Component Value Date   GLUCAP 172 (H) 03/28/2021   HGBA1C 12.8 (H) 03/26/2021    Review of Glycemic Control Results for Shelby Brown, Shelby Brown (MRN [id number]) as of 03/28/2021 12:16  Ref. Range 03/28/2021 10:07 03/28/2021 10:59 03/28/2021 12:03  Glucose-Capillary Latest Ref Range: 70 - 99 mg/dL 280 (H) 034 (H) 917 (H)   Diabetes history: DM Outpatient Diabetes medications:  Novolin 70/30 10 units bid Metformin 1000 mg bid Current orders for Inpatient glycemic control:  Levemir 10 units bid Novolog sensitive q 4 hours  Inpatient Diabetes Program Recommendations:   Agree with current orders and transition off insulin drip. Note patient's complaints regarding the "smell of insulin".  Will attempt to speak with patient through interpreter either today or tomorrow. A1C indicates poor control of DM. A1C has been consistently high with each lab since 2010.    Thanks,  Beryl Meager, RN, BC-ADM Inpatient Diabetes Coordinator Pager [phone] (8a-5p)

## 2021-03-28 NOTE — ED Notes (Signed)
This RN called floor to get purple man.

## 2021-03-28 NOTE — Progress Notes (Signed)
PROGRESS NOTE    Ilina Xu  QQI:[id number] DOB: 04/30/1981 DOA: 03/26/2021 PCP: Patient, No Pcp Per (Inactive)    Brief Narrative:  Patient with history of insulin-dependent type 2 diabetes with known hemoglobin A1c more than 10 and noncompliance to insulin.  Presented to the emergency room with nausea, vomiting, epigastric pain and extreme weakness after not using insulin for about 1 week.  Patient is on insulin for more than 19 years and she often misses it because the smell of insulin makes her nauseous.  In the emergency room hemodynamically stable.  Found to be on DKA with significant acidosis and admitted to the hospital with IV insulin protocol.  Continues to have nausea.   Assessment & Plan:   Principal Problem:   DKA (diabetic ketoacidosis) (HCC) Active Problems:   Nausea & vomiting   Abdominal pain   AKI (acute kidney injury) (HCC)  Diabetic ketoacidosis in a patient with known IDDM: Anion gap closed for more than 24 hours.  Acidosis improved.  Patient still with poor oral intake and nausea.  Due to relative stability, will start patient on a small dose of subcu insulin.  We will start with Levemir 10 units twice a day and sensitive sliding scale insulin. A1c is 12.8.  Used video interpreter and also with husband at the bedside to discuss in detail about treatment plan and use of insulin.  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 Continue maintenance IV fluid.  We will start patient on scheduled metoclopramide and challenge with liquid diet and advance as tolerated. On Protonix twice daily.  Hypophosphatemia: Replace aggressively and recheck levels.  Acute kidney injury: Due to #1.  Resolved.  Recheck levels tomorrow morning.  DVT prophylaxis: enoxaparin (LOVENOX) injection 40 mg Start: 03/28/21 1000   Code Status: Full code Family Communication: Husband at the bedside Disposition Plan: Status is: Inpatient  Remains  inpatient appropriate because:IV treatments appropriate due to intensity of illness or inability to take PO and Inpatient level of care appropriate due to severity of illness  Dispo:  Patient From: Home  Planned Disposition: Home  Medically stable for discharge: No           Consultants:  None  Procedures:  None  Antimicrobials:  None   Subjective: Patient seen and examined.  Husband at the bedside.  Still continues to have epigastric pain and intermittent nausea.  Not vomiting as like yesterday.  Wants to try some ginger ale and crackers.  Blood sugars has been consistently less than 200 and anion gap is closed.  Objective: Vitals:   03/28/21 0745 03/28/21 0752 03/28/21 0933 03/28/21 1000  BP:  131/71 (!) 154/75 (!) 164/82  Pulse: 80 80 (!) 102 84  Resp: 14 16 (!) 26 14  Temp:  98.4 F (36.9 C) 99.1 F (37.3 C)   TempSrc:  Oral Oral   SpO2: 99% 99% 100% 100%  Weight:   48.1 kg   Height:   5' (1.524 m)     Intake/Output Summary (Last 24 hours) at 03/28/2021 1126 Last data filed at 03/28/2021 0403 Gross per 24 hour  Intake 3096.23 ml  Output --  Net 3096.23 ml   Filed Weights   03/27/21 0511 03/28/21 0933  Weight: 52.2 kg 48.1 kg    Examination:  General exam: Appears calm and comfortable  Respiratory system: Clear to auscultation. Respiratory effort normal. Cardiovascular system: S1 & S2 heard, RRR. No JVD, murmurs, rubs, gallops or clicks. No pedal edema. Gastrointestinal system: Abdomen  is nondistended, soft and nontender. No organomegaly or masses felt. Normal bowel sounds heard. Central nervous system: Alert and oriented. No focal neurological deficits. Extremities: Symmetric 5 x 5 power. Skin: No rashes, lesions or ulcers Psychiatry: Judgement and insight appear normal. Mood & affect appropriate.     Data Reviewed: I have personally reviewed following labs and imaging studies  CBC: Recent Labs  Lab 03/26/21 2143 03/27/21 0518  WBC 19.6*  17.1*  HGB 13.1 10.7*  HCT 43.5 34.0*  MCV 93.5 89.5  PLT 306 276   Basic Metabolic Panel: Recent Labs  Lab 03/26/21 2143 03/27/21 0208 03/27/21 0518 03/27/21 0900 03/28/21 0545  NA 137 143 144 144  --   K 5.1 4.2 3.8 3.6  --   CL 103 114* 118* 119*  --   CO2 <7* 10* 16* 18*  --   GLUCOSE 565* 373* 270* 267*  --   BUN 30* 29* 28* 25*  --   CREATININE 1.39* 1.18* 1.01* 0.81  --   CALCIUM 9.5 8.7* 8.7* 8.7*  --   MG  --   --  2.2  --  2.0  PHOS  --   --  1.7*  --  1.4*   GFR: Estimated Creatinine Clearance: 65.7 mL/min (by C-G formula based on SCr of 0.81 mg/dL). Liver Function Tests: Recent Labs  Lab 03/26/21 2143  AST 13*  ALT 14  ALKPHOS 80  BILITOT 1.9*  PROT 9.0*  ALBUMIN 4.1   Recent Labs  Lab 03/26/21 2143  LIPASE 30   No results for input(s): AMMONIA in the last 168 hours. Coagulation Profile: No results for input(s): INR, PROTIME in the last 168 hours. Cardiac Enzymes: No results for input(s): CKTOTAL, CKMB, CKMBINDEX, TROPONINI in the last 168 hours. BNP (last 3 results) No results for input(s): PROBNP in the last 8760 hours. HbA1C: Recent Labs    03/26/21 2140  HGBA1C 12.8*   CBG: Recent Labs  Lab 03/28/21 0641 03/28/21 0742 03/28/21 0840 03/28/21 1007 03/28/21 1059  GLUCAP 197* 192* 166* 136* 179*   Lipid Profile: No results for input(s): CHOL, HDL, LDLCALC, TRIG, CHOLHDL, LDLDIRECT in the last 72 hours. Thyroid Function Tests: No results for input(s): TSH, T4TOTAL, FREET4, T3FREE, THYROIDAB in the last 72 hours. Anemia Panel: No results for input(s): VITAMINB12, FOLATE, FERRITIN, TIBC, IRON, RETICCTPCT in the last 72 hours. Sepsis Labs: No results for input(s): PROCALCITON, LATICACIDVEN in the last 168 hours.  Recent Results (from the past 240 hour(s))  Resp Panel by RT-PCR (Flu A&B, Covid) Nasopharyngeal Swab     Status: None   Collection Time: 03/27/21  1:26 AM   Specimen: Nasopharyngeal Swab; Nasopharyngeal(NP) swabs in vial  transport medium  Result Value Ref Range Status   SARS Coronavirus 2 by RT PCR NEGATIVE NEGATIVE Final    Comment: (NOTE) SARS-CoV-2 target nucleic acids are NOT DETECTED.  The SARS-CoV-2 RNA is generally detectable in upper respiratory specimens during the acute phase of infection. The lowest concentration of SARS-CoV-2 viral copies this assay can detect is 138 copies/mL. A negative result does not preclude SARS-Cov-2 infection and should not be used as the sole basis for treatment or other patient management decisions. A negative result may occur with  improper specimen collection/handling, submission of specimen other than nasopharyngeal swab, presence of viral mutation(s) within the areas targeted by this assay, and inadequate number of viral copies(<138 copies/mL). A negative result must be combined with clinical observations, patient history, and epidemiological information. The expected result is  Negative.  Fact Sheet for Patients:  BloggerCourse.com  Fact Sheet for Healthcare Providers:  SeriousBroker.it  This test is no t yet approved or cleared by the [place] FDA and  has been authorized for detection and/or diagnosis of SARS-CoV-2 by FDA under an Emergency Use Authorization (EUA). This EUA will remain  in effect (meaning this test can be used) for the duration of the COVID-19 declaration under Section 564(b)(1) of the Act, 21 U.S.C.section 360bbb-3(b)(1), unless the authorization is terminated  or revoked sooner.       Influenza A by PCR NEGATIVE NEGATIVE Final   Influenza B by PCR NEGATIVE NEGATIVE Final    Comment: (NOTE) The Xpert Xpress SARS-CoV-2/FLU/RSV plus assay is intended as an aid in the diagnosis of influenza from Nasopharyngeal swab specimens and should not be used as a sole basis for treatment. Nasal washings and aspirates are unacceptable for Xpert Xpress SARS-CoV-2/FLU/RSV testing.  Fact  Sheet for Patients: BloggerCourse.com  Fact Sheet for Healthcare Providers: SeriousBroker.it  This test is not yet approved or cleared by the [place] FDA and has been authorized for detection and/or diagnosis of SARS-CoV-2 by FDA under an Emergency Use Authorization (EUA). This EUA will remain in effect (meaning this test can be used) for the duration of the COVID-19 declaration under Section 564(b)(1) of the Act, 21 U.S.C. section 360bbb-3(b)(1), unless the authorization is terminated or revoked.  Performed at [facility], [address] [place], [place] [address]   Urine culture     Status: Abnormal   Collection Time: 03/27/21  2:40 AM   Specimen: Urine, Random  Result Value Ref Range Status   Specimen Description   Final    URINE, RANDOM Performed at [facility], [address], [place], [place] [address]    Special Requests   Final    NONE Performed at [facility], [address] [place], [place] [address]    Culture MULTIPLE SPECIES PRESENT, SUGGEST RECOLLECTION (A)  Final   Report Status 03/28/2021 FINAL  Final  MRSA Next Gen by PCR, Nasal     Status: None   Collection Time: 03/28/21  9:32 AM   Specimen: Nasal Mucosa; Nasal Swab  Result Value Ref Range Status   MRSA by PCR Next Gen NOT DETECTED NOT DETECTED Final    Comment: (NOTE) The GeneXpert MRSA Assay (FDA approved for NASAL specimens only), is one component of a comprehensive MRSA colonization surveillance program. It is not intended to diagnose MRSA infection nor to guide or monitor treatment for MRSA infections. Test performance is not FDA approved in patients less than 44 years old. Performed at [facility], [address] [place], [place] [address]          Radiology Studies: No results found.      Scheduled Meds:  Chlorhexidine Gluconate Cloth  6 each  Topical Daily   enoxaparin (LOVENOX) injection  40 mg Subcutaneous Q24H   insulin aspart  0-9 Units Subcutaneous Q4H   insulin detemir  10 Units Subcutaneous BID   metoCLOPramide (REGLAN) injection  5 mg Intravenous Q6H   pantoprazole (PROTONIX) IV  40 mg Intravenous Q12H   Continuous Infusions:  lactated ringers Stopped (03/27/21 0509)   potassium PHOSPHATE IVPB (in mmol) 30 mmol (03/28/21 0905)     LOS: 1 day    Time spent: 35 minutes    Dorcas Carrow, MD [facility] Pager [phone]

## 2021-03-28 NOTE — ED Notes (Signed)
CBG at 0840 was 166. Did not cross over into chart

## 2021-03-29 DIAGNOSIS — N179 Acute kidney failure, unspecified: Secondary | ICD-10-CM

## 2021-03-29 LAB — BASIC METABOLIC PANEL
Anion gap: 6 (ref 5–15)
Anion gap: 9 (ref 5–15)
BUN: 5 mg/dL — ABNORMAL LOW (ref 6–20)
BUN: 6 mg/dL (ref 6–20)
CO2: 28 mmol/L (ref 22–32)
CO2: 25 mmol/L (ref 22–32)
Calcium: 7.9 mg/dL — ABNORMAL LOW (ref 8.9–10.3)
Calcium: 7.7 mg/dL — ABNORMAL LOW (ref 8.9–10.3)
Chloride: 107 mmol/L (ref 98–111)
Chloride: 105 mmol/L (ref 98–111)
Creatinine, Ser: 0.52 mg/dL (ref 0.44–1.00)
Creatinine, Ser: 0.62 mg/dL (ref 0.44–1.00)
GFR, Estimated: >60 mL/min (ref >60)
GFR, Estimated: >60 mL/min (ref >60)
Glucose, Bld: 76 mg/dL (ref 70–99)
Glucose, Bld: 249 mg/dL — ABNORMAL HIGH (ref 70–99)
Potassium: 2.6 mmol/L — CL (ref 3.5–5.1)
Potassium: 3.8 mmol/L (ref 3.5–5.1)
Sodium: 141 mmol/L (ref 135–145)
Sodium: 139 mmol/L (ref 135–145)

## 2021-03-29 LAB — CBC WITH DIFFERENTIAL/PLATELET
Abs Immature Granulocytes: 0.03 10*3/uL (ref 0.00–0.07)
Basophils Absolute: 0 10*3/uL (ref 0.0–0.1)
Basophils Relative: 1 %
Eosinophils Absolute: 0.1 10*3/uL (ref 0.0–0.5)
Eosinophils Relative: 1 %
HCT: 33.8 % — ABNORMAL LOW (ref 36.0–46.0)
Hemoglobin: 10.7 g/dL — ABNORMAL LOW (ref 12.0–15.0)
Immature Granulocytes: 0 %
Lymphocytes Relative: 27 %
Lymphs Abs: 2.1 10*3/uL (ref 0.7–4.0)
MCH: 27.6 pg (ref 26.0–34.0)
MCHC: 31.7 g/dL (ref 30.0–36.0)
MCV: 87.1 fL (ref 80.0–100.0)
Monocytes Absolute: 0.9 10*3/uL (ref 0.1–1.0)
Monocytes Relative: 12 %
Neutro Abs: 4.5 10*3/uL (ref 1.7–7.7)
Neutrophils Relative %: 59 %
Platelets: 267 10*3/uL (ref 150–400)
RBC: 3.88 MIL/uL (ref 3.87–5.11)
RDW: 15.3 % (ref 11.5–15.5)
WBC: 7.7 10*3/uL (ref 4.0–10.5)
nRBC: 0 % (ref 0.0–0.2)

## 2021-03-29 LAB — GLUCOSE, CAPILLARY
Glucose-Capillary: 90 mg/dL (ref 70–99)
Glucose-Capillary: 129 mg/dL — ABNORMAL HIGH (ref 70–99)
Glucose-Capillary: 171 mg/dL — ABNORMAL HIGH (ref 70–99)
Glucose-Capillary: 201 mg/dL — ABNORMAL HIGH (ref 70–99)

## 2021-03-29 LAB — PHOSPHORUS
Phosphorus: 2.3 mg/dL — ABNORMAL LOW (ref 2.5–4.6)
Phosphorus: 3.9 mg/dL (ref 2.5–4.6)

## 2021-03-29 LAB — MAGNESIUM
Magnesium: 1.5 mg/dL — ABNORMAL LOW (ref 1.7–2.4)
Magnesium: 1.8 mg/dL (ref 1.7–2.4)

## 2021-03-29 MED ORDER — POTASSIUM CHLORIDE CRYS ER 20 MEQ PO TBCR
40.0000 meq | EXTENDED_RELEASE_TABLET | Freq: Once | ORAL | Status: AC
  Administered 2021-03-29: 40 meq via ORAL
  Filled 2021-03-29: qty 2

## 2021-03-29 MED ORDER — SODIUM CHLORIDE 0.9 % IV SOLN
INTRAVENOUS | Status: DC | PRN
  Administered 2021-03-29: 250 mL via INTRAVENOUS

## 2021-03-29 MED ORDER — LIDOCAINE VISCOUS HCL 2 % MT SOLN
15.0000 mL | Freq: Four times a day (QID) | OROMUCOSAL | Status: DC | PRN
  Administered 2021-03-29: 15 mL via OROMUCOSAL
  Filled 2021-03-29 (×3): qty 15

## 2021-03-29 MED ORDER — POTASSIUM CHLORIDE CRYS ER 20 MEQ PO TBCR
40.0000 meq | EXTENDED_RELEASE_TABLET | Freq: Three times a day (TID) | ORAL | Status: DC
  Administered 2021-03-29 (×2): 40 meq via ORAL
  Filled 2021-03-29 (×2): qty 2

## 2021-03-29 MED ORDER — METOCLOPRAMIDE HCL 10 MG PO TABS: 10.0000 mg | ORAL_TABLET | Freq: Four times a day (QID) | ORAL | 0 refills | Status: DC | PRN

## 2021-03-29 MED ORDER — NOVOLIN 70/30 FLEXPEN RELION (70-30) 100 UNIT/ML ~~LOC~~ SUPN: 10.0000 [IU] | PEN_INJECTOR | Freq: Two times a day (BID) | SUBCUTANEOUS | 11 refills | Status: DC

## 2021-03-29 MED ORDER — METFORMIN HCL 1000 MG PO TABS
1000.0000 mg | ORAL_TABLET | Freq: Two times a day (BID) | ORAL | 1 refills | Status: DC
Start: 2021-03-29 — End: 2023-11-18

## 2021-03-29 MED ORDER — INSULIN STARTER KIT- PEN NEEDLES (SPANISH)
1.0000 | Freq: Once | Status: AC
  Administered 2021-03-29: 1
  Filled 2021-03-29: qty 1

## 2021-03-29 MED ORDER — MAGNESIUM SULFATE 2 GM/50ML IV SOLN
2.0000 g | Freq: Once | INTRAVENOUS | Status: AC
  Administered 2021-03-29: 2 g via INTRAVENOUS
  Filled 2021-03-29: qty 50

## 2021-03-29 MED ORDER — METOCLOPRAMIDE HCL 5 MG PO TABS
10.0000 mg | ORAL_TABLET | Freq: Four times a day (QID) | ORAL | Status: DC | PRN
  Administered 2021-03-29: 10 mg via ORAL
  Filled 2021-03-29: qty 2

## 2021-03-29 MED ORDER — MAGNESIUM 250 MG PO TABS: 250.0000 mg | ORAL_TABLET | Freq: Two times a day (BID) | ORAL | 0 refills | Status: AC

## 2021-03-29 MED ORDER — POTASSIUM PHOSPHATES 15 MMOLE/5ML IV SOLN
20.0000 mmol | Freq: Once | INTRAVENOUS | Status: AC
  Administered 2021-03-29: 20 mmol via INTRAVENOUS
  Filled 2021-03-29: qty 6.67

## 2021-03-29 MED ORDER — POTASSIUM CHLORIDE CRYS ER 20 MEQ PO TBCR: 20.0000 meq | EXTENDED_RELEASE_TABLET | Freq: Every day | ORAL | 0 refills | Status: DC

## 2021-03-29 MED ORDER — FAMOTIDINE 20 MG PO TABS: 20.0000 mg | ORAL_TABLET | Freq: Two times a day (BID) | ORAL | 11 refills | Status: DC

## 2021-03-29 NOTE — Discharge Summary (Signed)
Physician Discharge Summary  Shelby Brown JAS:[id number] DOB: 1981-07-17 DOA: 03/26/2021  PCP: Patient, No Pcp Per (Inactive)  Admit date: 03/26/2021 Discharge date: 03/29/2021  Admitted From: Home Disposition: Home  Recommendations for Outpatient Follow-up:  Follow up with PCP in 1-2 weeks Please obtain BMP/CBC/magnesium/phosphorus in one week  Home Health: Not needed Equipment/Devices: Not needed  Discharge Condition: Stable CODE STATUS: Full code Diet recommendation: Low-carb diet  Discharge summary:  Patient with history of insulin-dependent diabetes, poorly controlled and noncompliance to insulin presented with nausea, vomiting, epigastric pain and extreme weakness after not using insulin for about 1 week.  Patient is supposed to be on insulin for long time, she often does not take it because the smell of insulin makes her nauseous.  In the emergency room she was hemodynamically stable.  She was found to be profoundly acidotic with anion gap unmeasurable.  She was started on IV fluids and IV insulin protocol and admitted to the hospital.  At the same time, she had intractable vomiting and epigastric pain.  Diabetic ketoacidosis in a patient with uncontrolled type 2 diabetes: Adequate clinical improvement now.  Transition to subcu insulin.  Extensive counseling and education given. Hemoglobin A1c 12.8. Patient willing to use insulin pen that may have less smell of insulin.  She was prescribed generic insulin 70/30, 10 units twice a day along with metformin 1 g twice a day.  Discussed about blood sugar monitoring at home and follow-up with primary care physician.  Patient presented with intractable vomiting mainly related to DKA.  Resolved now.  Able to eat full diet.  Will discharge with as needed metoclopramide.  She has significant epigastric pain, will benefit with acid suppression.  We will prescribe Pepcid 20 mg twice a day.  Acute kidney  injury/hypokalemia/hypomagnesemia/hypophosphatemia: Aggressively treated and resolved.  Replaced before discharge.  Will prescribe short course of potassium and magnesium supplement to go home.   Discharge Diagnoses:  Principal Problem:   DKA (diabetic ketoacidosis) ([place]) Active Problems:   Nausea & vomiting   Abdominal pain   AKI (acute kidney injury) [facility])    Discharge Instructions  Discharge Instructions     Diet Carb Modified   Complete by: As directed    Increase activity slowly   Complete by: As directed       Allergies as of 03/29/2021   No Known Allergies      Medication List     STOP taking these medications    ferrous gluconate 324 MG tablet Commonly known as: FERGON   HYDROcodone-acetaminophen 5-325 MG tablet Commonly known as: NORCO/VICODIN   NovoLIN 70/30 ReliOn (70-30) 100 UNIT/ML injection Generic drug: insulin NPH-regular Human Replaced by: NovoLIN 70/30 FlexPen Relion (70-30) 100 UNIT/ML KwikPen       TAKE these medications    blood glucose meter kit and supplies Kit Dispense based on patient and insurance preference. Use up to four times daily as directed. (FOR ICD-9 250.00, 250.01).   famotidine 20 MG tablet Commonly known as: PEPCID Take 1 tablet (20 mg total) by mouth 2 (two) times daily.   Insulin Syringes (Disposable) U-100 0.5 ML Misc 1 Package by Does not apply route in the morning and at bedtime.   Magnesium 250 MG Tabs Take 1 tablet (250 mg total) by mouth 2 (two) times daily for 7 days.   metFORMIN 1000 MG tablet Commonly known as: GLUCOPHAGE Take 1 tablet (1,000 mg total) by mouth 2 (two) times daily with a meal.   metoCLOPramide 10 MG tablet  Commonly known as: REGLAN Take 1 tablet (10 mg total) by mouth every 6 (six) hours as needed for up to 7 days for nausea.   NovoLIN 70/30 FlexPen Relion (70-30) 100 UNIT/ML KwikPen Generic drug: insulin isophane & regular human Inject 10 Units into the skin 2 (two) times  daily. Replaces: NovoLIN 70/30 ReliOn (70-30) 100 UNIT/ML injection   potassium chloride SA 20 MEQ tablet Commonly known as: KLOR-CON Take 1 tablet (20 mEq total) by mouth daily for 7 days.   UltiCare Insulin Syringe 30G X 1/2" 0.5 ML Misc Generic drug: Insulin Syringe-Needle U-100 USE AS DIRECTED IN THE MORNING AND AT BEDTIME        No Known Allergies  Consultations: None   Procedures/Studies: No results found. (Echo, Carotid, EGD, Colonoscopy, ERCP)    Subjective: Patient seen and examined.  Patient examined in the evening rounds again for discharge readiness.  In the evening, patient has some hoarse voice otherwise denies any complaints.  She was able to eat most of her meal and walk around in the hallway.  Denies any nausea or vomiting.  Wants to go home.  Husband at the bedside.   Discharge Exam: Vitals:   03/29/21 1500 03/29/21 1600  BP: 113/75 (!) 149/80  Pulse: 84 81  Resp: 20 14  Temp:    SpO2: 100% 100%   Vitals:   03/29/21 1300 03/29/21 1400 03/29/21 1500 03/29/21 1600  BP: 112/78 (!) 92/55 113/75 (!) 149/80  Pulse: 83 86 84 81  Resp: $Remo'16 13 20 14  'Erqxu$ Temp:      TempSrc:      SpO2: 98% 100% 100% 100%  Weight:      Height:        General: Pt is alert, awake, not in acute distress Cardiovascular: RRR, S1/S2 +, no rubs, no gallops Respiratory: CTA bilaterally, no wheezing, no rhonchi Abdominal: Soft, NT, ND, bowel sounds + Extremities: no edema, no cyanosis    The results of significant diagnostics from this hospitalization (including imaging, microbiology, ancillary and laboratory) are listed below for reference.     Microbiology: Recent Results (from the past 240 hour(s))  Resp Panel by RT-PCR (Flu A&B, Covid) Nasopharyngeal Swab     Status: None   Collection Time: 03/27/21  1:26 AM   Specimen: Nasopharyngeal Swab; Nasopharyngeal(NP) swabs in vial transport medium  Result Value Ref Range Status   SARS Coronavirus 2 by RT PCR NEGATIVE NEGATIVE  Final    Comment: (NOTE) SARS-CoV-2 target nucleic acids are NOT DETECTED.  The SARS-CoV-2 RNA is generally detectable in upper respiratory specimens during the acute phase of infection. The lowest concentration of SARS-CoV-2 viral copies this assay can detect is 138 copies/mL. A negative result does not preclude SARS-Cov-2 infection and should not be used as the sole basis for treatment or other patient management decisions. A negative result may occur with  improper specimen collection/handling, submission of specimen other than nasopharyngeal swab, presence of viral mutation(s) within the areas targeted by this assay, and inadequate number of viral copies(<138 copies/mL). A negative result must be combined with clinical observations, patient history, and epidemiological information. The expected result is Negative.  Fact Sheet for Patients:  EntrepreneurPulse.com.au  Fact Sheet for Healthcare Providers:  IncredibleEmployment.be  This test is no t yet approved or cleared by the [place] FDA and  has been authorized for detection and/or diagnosis of SARS-CoV-2 by FDA under an Emergency Use Authorization (EUA). This EUA will remain  in effect (meaning this test can  be used) for the duration of the COVID-19 declaration under Section 564(b)(1) of the Act, 21 U.S.C.section 360bbb-3(b)(1), unless the authorization is terminated  or revoked sooner.       Influenza A by PCR NEGATIVE NEGATIVE Final   Influenza B by PCR NEGATIVE NEGATIVE Final    Comment: (NOTE) The Xpert Xpress SARS-CoV-2/FLU/RSV plus assay is intended as an aid in the diagnosis of influenza from Nasopharyngeal swab specimens and should not be used as a sole basis for treatment. Nasal washings and aspirates are unacceptable for Xpert Xpress SARS-CoV-2/FLU/RSV testing.  Fact Sheet for Patients: EntrepreneurPulse.com.au  Fact Sheet for Healthcare  Providers: IncredibleEmployment.be  This test is not yet approved or cleared by the [place] FDA and has been authorized for detection and/or diagnosis of SARS-CoV-2 by FDA under an Emergency Use Authorization (EUA). This EUA will remain in effect (meaning this test can be used) for the duration of the COVID-19 declaration under Section 564(b)(1) of the Act, 21 U.S.C. section 360bbb-3(b)(1), unless the authorization is terminated or revoked.  Performed at [facility], [place] [address], [place], [place] [address]   Urine culture     Status: Abnormal   Collection Time: 03/27/21  2:40 AM   Specimen: Urine, Random  Result Value Ref Range Status   Specimen Description   Final    URINE, RANDOM Performed at [facility] [address], [place], [place] [address]    Special Requests   Final    NONE Performed at [facility], [place] [address], [place], [place] [address]    Culture MULTIPLE SPECIES PRESENT, SUGGEST RECOLLECTION (A)  Final   Report Status 03/28/2021 FINAL  Final  MRSA Next Gen by PCR, Nasal     Status: None   Collection Time: 03/28/21  9:32 AM   Specimen: Nasal Mucosa; Nasal Swab  Result Value Ref Range Status   MRSA by PCR Next Gen NOT DETECTED NOT DETECTED Final    Comment: (NOTE) The GeneXpert MRSA Assay (FDA approved for NASAL specimens only), is one component of a comprehensive MRSA colonization surveillance program. It is not intended to diagnose MRSA infection nor to guide or monitor treatment for MRSA infections. Test performance is not FDA approved in patients less than 81 years old. Performed at [facility], [place] [address] [place],  [address]      Labs: BNP (last 3 results) No results for input(s): BNP in the last 8760 hours. Basic Metabolic Panel: Recent Labs  Lab 03/27/21 0208 03/27/21 0518 03/27/21 0900 03/28/21 0545 03/29/21 0234  03/29/21 1358  NA 143 144 144  --  141 139  K 4.2 3.8 3.6  --  2.6* 3.8  CL 114* 118* 119*  --  107 105  CO2 10* 16* 18*  --  28 25  GLUCOSE 373* 270* 267*  --  76 249*  BUN 29* 28* 25*  --  5* 6  CREATININE 1.18* 1.01* 0.81  --  0.52 0.62  CALCIUM 8.7* 8.7* 8.7*  --  7.9* 7.7*  MG  --  2.2  --  2.0 1.5* 1.8  PHOS  --  1.7*  --  1.4* 2.3* 3.9   Liver Function Tests: Recent Labs  Lab 03/26/21 2143  AST 13*  ALT 14  ALKPHOS 80  BILITOT 1.9*  PROT 9.0*  ALBUMIN 4.1   Recent Labs  Lab 03/26/21 2143  LIPASE 30   No results for input(s): AMMONIA in the last 168  hours. CBC: Recent Labs  Lab 03/26/21 2143 03/27/21 0518 03/29/21 0234  WBC 19.6* 17.1* 7.7  NEUTROABS  --   --  4.5  HGB 13.1 10.7* 10.7*  HCT 43.5 34.0* 33.8*  MCV 93.5 89.5 87.1  PLT 306 276 267   Cardiac Enzymes: No results for input(s): CKTOTAL, CKMB, CKMBINDEX, TROPONINI in the last 168 hours. BNP: Invalid input(s): POCBNP CBG: Recent Labs  Lab 03/28/21 1934 03/28/21 2317 03/29/21 0400 03/29/21 0727 03/29/21 1108  GLUCAP 80 111* 90 129* 171*   D-Dimer No results for input(s): DDIMER in the last 72 hours. Hgb A1c Recent Labs    03/26/21 2140  HGBA1C 12.8*   Lipid Profile No results for input(s): CHOL, HDL, LDLCALC, TRIG, CHOLHDL, LDLDIRECT in the last 72 hours. Thyroid function studies No results for input(s): TSH, T4TOTAL, T3FREE, THYROIDAB in the last 72 hours.  Invalid input(s): FREET3 Anemia work up No results for input(s): VITAMINB12, FOLATE, FERRITIN, TIBC, IRON, RETICCTPCT in the last 72 hours. Urinalysis    Component Value Date/Time   COLORURINE STRAW (A) 03/27/2021 0240   APPEARANCEUR CLEAR 03/27/2021 0240   LABSPEC 1.017 03/27/2021 0240   PHURINE 5.0 03/27/2021 0240   GLUCOSEU >=500 (A) 03/27/2021 0240   GLUCOSEU > 1000 mg/dL (A) 12/10/2007 2144   HGBUR MODERATE (A) 03/27/2021 0240   BILIRUBINUR NEGATIVE 03/27/2021 0240   BILIRUBINUR small (A) 08/06/2017 1044    KETONESUR 80 (A) 03/27/2021 0240   PROTEINUR 30 (A) 03/27/2021 0240   UROBILINOGEN 0.2 08/06/2017 1044   UROBILINOGEN 0.2 11/28/2014 1725   NITRITE NEGATIVE 03/27/2021 0240   LEUKOCYTESUR NEGATIVE 03/27/2021 0240   Sepsis Labs Invalid input(s): PROCALCITONIN,  WBC,  LACTICIDVEN Microbiology Recent Results (from the past 240 hour(s))  Resp Panel by RT-PCR (Flu A&B, Covid) Nasopharyngeal Swab     Status: None   Collection Time: 03/27/21  1:26 AM   Specimen: Nasopharyngeal Swab; Nasopharyngeal(NP) swabs in vial transport medium  Result Value Ref Range Status   SARS Coronavirus 2 by RT PCR NEGATIVE NEGATIVE Final    Comment: (NOTE) SARS-CoV-2 target nucleic acids are NOT DETECTED.  The SARS-CoV-2 RNA is generally detectable in upper respiratory specimens during the acute phase of infection. The lowest concentration of SARS-CoV-2 viral copies this assay can detect is 138 copies/mL. A negative result does not preclude SARS-Cov-2 infection and should not be used as the sole basis for treatment or other patient management decisions. A negative result may occur with  improper specimen collection/handling, submission of specimen other than nasopharyngeal swab, presence of viral mutation(s) within the areas targeted by this assay, and inadequate number of viral copies(<138 copies/mL). A negative result must be combined with clinical observations, patient history, and epidemiological information. The expected result is Negative.  Fact Sheet for Patients:  EntrepreneurPulse.com.au  Fact Sheet for Healthcare Providers:  IncredibleEmployment.be  This test is no t yet approved or cleared by the [place] FDA and  has been authorized for detection and/or diagnosis of SARS-CoV-2 by FDA under an Emergency Use Authorization (EUA). This EUA will remain  in effect (meaning this test can be used) for the duration of the COVID-19 declaration under Section  564(b)(1) of the Act, 21 U.S.C.section 360bbb-3(b)(1), unless the authorization is terminated  or revoked sooner.       Influenza A by PCR NEGATIVE NEGATIVE Final   Influenza B by PCR NEGATIVE NEGATIVE Final    Comment: (NOTE) The Xpert Xpress SARS-CoV-2/FLU/RSV plus assay is intended as an aid in the diagnosis of  influenza from Nasopharyngeal swab specimens and should not be used as a sole basis for treatment. Nasal washings and aspirates are unacceptable for Xpert Xpress SARS-CoV-2/FLU/RSV testing.  Fact Sheet for Patients: EntrepreneurPulse.com.au  Fact Sheet for Healthcare Providers: IncredibleEmployment.be  This test is not yet approved or cleared by the [place] FDA and has been authorized for detection and/or diagnosis of SARS-CoV-2 by FDA under an Emergency Use Authorization (EUA). This EUA will remain in effect (meaning this test can be used) for the duration of the COVID-19 declaration under Section 564(b)(1) of the Act, 21 U.S.C. section 360bbb-3(b)(1), unless the authorization is terminated or revoked.  Performed at [facility], [place] [address] [place], [place] [address]   Urine culture     Status: Abnormal   Collection Time: 03/27/21  2:40 AM   Specimen: Urine, Random  Result Value Ref Range Status   Specimen Description   Final    URINE, RANDOM Performed at [place] [address], [place], [place] [address]    Special Requests   Final    NONE Performed at [facility], [place] [address], [place], [place] [address]    Culture MULTIPLE SPECIES PRESENT, SUGGEST RECOLLECTION (A)  Final   Report Status 03/28/2021 FINAL  Final  MRSA Next Gen by PCR, Nasal     Status: None   Collection Time: 03/28/21  9:32 AM   Specimen: Nasal Mucosa; Nasal Swab  Result Value Ref Range Status   MRSA by PCR Next Gen NOT DETECTED NOT DETECTED Final    Comment: (NOTE) The  GeneXpert MRSA Assay (FDA approved for NASAL specimens only), is one component of a comprehensive MRSA colonization surveillance program. It is not intended to diagnose MRSA infection nor to guide or monitor treatment for MRSA infections. Test performance is not FDA approved in patients less than 68 years old. Performed at [facility], [place] [address] [place], [place] [address]      Time coordinating discharge:  35 minutes  SIGNED:   Barb Merino, MD  [facility] 03/29/2021, 4:25 PM

## 2021-03-29 NOTE — Progress Notes (Signed)
PROGRESS NOTE    Shelby Brown  KUV:[id number] DOB: [birth year] DOA: 03/26/2021 PCP: Patient, No Pcp Per (Inactive)    Brief Narrative:  Patient with history of insulin-dependent type 2 diabetes with known hemoglobin A1c more than 10 and noncompliance to insulin.  Presented to the emergency room with nausea, vomiting, epigastric pain and extreme weakness after not using insulin for about 1 week.  Patient is on insulin for more than 19 years and she often misses it because the smell of insulin makes her nauseous.  In the emergency room hemodynamically stable.  Found to be on DKA with significant acidosis and admitted to the hospital with IV insulin protocol.    Assessment & Plan:   Principal Problem:   DKA (diabetic ketoacidosis) ([place]) Active Problems:   Nausea & vomiting   Abdominal pain   AKI (acute kidney injury) ([place])  Diabetic ketoacidosis in a patient with known IDDM: Patient with some clinical improvement today.  Transitioned to subcu insulin. Appetite is still remains poor. A1c is 12.8.  Used video interpreter and also with husband at the bedside to discuss in detail about treatment plan and use of insulin. Patient will be more compliant with insulin pens, will use 70/30 Novolin generic insulin pens on discharge.  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 Some clinical improvement today.  Changed to oral metoclopramide.  Advance diet. On Protonix.  We will try some lidocaine viscous but throat pain.  Hypophosphatemia: Replace aggressively today.  Hypokalemia: Replace aggressively.  Recheck levels.  Acute kidney injury: Due to #1.  Resolved.    Patient with some clinical improvement.  He still does not have enough oral intake.  Blood sugars are better controlled.  Profound electrolyte abnormalities that has been replaced. Mobilize.  Recheck electrolytes at 2 PM.  If able to tolerate reasonable meal, will discharge today after  electrolytes normalized.  DVT prophylaxis: enoxaparin (LOVENOX) injection 40 mg Start: 03/28/21 1000   Code Status: Full code Family Communication: Husband at the bedside Disposition Plan: Status is: Inpatient  Remains inpatient appropriate because:IV treatments appropriate due to intensity of illness or inability to take PO and Inpatient level of care appropriate due to severity of illness  Dispo:  Patient From: Home  Planned Disposition: Home  Medically stable for discharge: No           Consultants:  None  Procedures:  None  Antimicrobials:  None   Subjective: Patient seen and examined.  Husband at the bedside.  He still has hoarse voice.  No more vomiting.  Has some nausea and trying to eat.  Eager to go home.  Agreeable to use insulin in the form of pens that may improve her compliance.  Objective: Vitals:   03/29/21 1024 03/29/21 1100 03/29/21 1102 03/29/21 1200  BP: 97/67  (!) 84/55 124/78  Pulse: 84  90 93  Resp: _0 Temp:    97.9 F (36.6 C)  TempSrc:    Oral  SpO2: 98%  100% 100%  Weight:      Height:        Intake/Output Summary (Last 24 hours) at 03/29/2021 1307 Last data filed at 03/29/2021 1200 Gross per 24 hour  Intake 3243.66 ml  Output 2700 ml  Net 543.66 ml   Filed Weights   03/27/21 0511 03/28/21 0933  Weight: 52.2 kg 48.1 kg    Examination:  General: Looks fairly comfortable at rest.  On room air.  Not in any distress. Cardiovascular:  S1-S2 normal. Respiratory: Bilateral clear. Gastrointestinal: Soft and nontender.  Bowel sounds present. Ext: No cyanosis or edema.    Data Reviewed: I have personally reviewed following labs and imaging studies  CBC: Recent Labs  Lab 03/26/21 2143 03/27/21 0518 03/29/21 0234  WBC 19.6* 17.1* 7.7  NEUTROABS  --   --  4.5  HGB 13.1 10.7* 10.7*  HCT 43.5 34.0* 33.8*  MCV 93.5 89.5 87.1  PLT 306 276 884   Basic Metabolic Panel: Recent Labs  Lab 03/26/21 2143 03/27/21 0208  03/27/21 0518 03/27/21 0900 03/28/21 0545 03/29/21 0234  NA 137 143 144 144  --  141  K 5.1 4.2 3.8 3.6  --  2.6*  CL 103 114* 118* 119*  --  107  CO2 <7* 10* 16* 18*  --  28  GLUCOSE 565* 373* 270* 267*  --  76  BUN 30* 29* 28* 25*  --  5*  CREATININE 1.39* 1.18* 1.01* 0.81  --  0.52  CALCIUM 9.5 8.7* 8.7* 8.7*  --  7.9*  MG  --   --  2.2  --  2.0 1.5*  PHOS  --   --  1.7*  --  1.4* 2.3*   GFR: Estimated Creatinine Clearance: 66.5 mL/min (by C-G formula based on SCr of 0.52 mg/dL). Liver Function Tests: Recent Labs  Lab 03/26/21 2143  AST 13*  ALT 14  ALKPHOS 80  BILITOT 1.9*  PROT 9.0*  ALBUMIN 4.1   Recent Labs  Lab 03/26/21 2143  LIPASE 30   No results for input(s): AMMONIA in the last 168 hours. Coagulation Profile: No results for input(s): INR, PROTIME in the last 168 hours. Cardiac Enzymes: No results for input(s): CKTOTAL, CKMB, CKMBINDEX, TROPONINI in the last 168 hours. BNP (last 3 results) No results for input(s): PROBNP in the last 8760 hours. HbA1C: Recent Labs    03/26/21 2140  HGBA1C 12.8*   CBG: Recent Labs  Lab 03/28/21 1934 03/28/21 2317 03/29/21 0400 03/29/21 0727 03/29/21 1108  GLUCAP 80 111* 90 129* 171*   Lipid Profile: No results for input(s): CHOL, HDL, LDLCALC, TRIG, CHOLHDL, LDLDIRECT in the last 72 hours. Thyroid Function Tests: No results for input(s): TSH, T4TOTAL, FREET4, T3FREE, THYROIDAB in the last 72 hours. Anemia Panel: No results for input(s): VITAMINB12, FOLATE, FERRITIN, TIBC, IRON, RETICCTPCT in the last 72 hours. Sepsis Labs: No results for input(s): PROCALCITON, LATICACIDVEN in the last 168 hours.  Recent Results (from the past 240 hour(s))  Resp Panel by RT-PCR (Flu A&B, Covid) Nasopharyngeal Swab     Status: None   Collection Time: 03/27/21  1:26 AM   Specimen: Nasopharyngeal Swab; Nasopharyngeal(NP) swabs in vial transport medium  Result Value Ref Range Status   SARS Coronavirus 2 by RT PCR NEGATIVE  NEGATIVE Final    Comment: (NOTE) SARS-CoV-2 target nucleic acids are NOT DETECTED.  The SARS-CoV-2 RNA is generally detectable in upper respiratory specimens during the acute phase of infection. The lowest concentration of SARS-CoV-2 viral copies this assay can detect is 138 copies/mL. A negative result does not preclude SARS-Cov-2 infection and should not be used as the sole basis for treatment or other patient management decisions. A negative result may occur with  improper specimen collection/handling, submission of specimen other than nasopharyngeal swab, presence of viral mutation(s) within the areas targeted by this assay, and inadequate number of viral copies(<138 copies/mL). A negative result must be combined with clinical observations, patient history, and epidemiological information. The expected result is Negative.  Fact Sheet for Patients:  EntrepreneurPulse.com.au  Fact Sheet for Healthcare Providers:  IncredibleEmployment.be  This test is no t yet approved or cleared by the [place] FDA and  has been authorized for detection and/or diagnosis of SARS-CoV-2 by FDA under an Emergency Use Authorization (EUA). This EUA will remain  in effect (meaning this test can be used) for the duration of the COVID-19 declaration under Section 564(b)(1) of the Act, 21 U.S.C.section 360bbb-3(b)(1), unless the authorization is terminated  or revoked sooner.       Influenza A by PCR NEGATIVE NEGATIVE Final   Influenza B by PCR NEGATIVE NEGATIVE Final    Comment: (NOTE) The Xpert Xpress SARS-CoV-2/FLU/RSV plus assay is intended as an aid in the diagnosis of influenza from Nasopharyngeal swab specimens and should not be used as a sole basis for treatment. Nasal washings and aspirates are unacceptable for Xpert Xpress SARS-CoV-2/FLU/RSV testing.  Fact Sheet for Patients: EntrepreneurPulse.com.au  Fact Sheet for Healthcare  Providers: IncredibleEmployment.be  This test is not yet approved or cleared by the [place] FDA and has been authorized for detection and/or diagnosis of SARS-CoV-2 by FDA under an Emergency Use Authorization (EUA). This EUA will remain in effect (meaning this test can be used) for the duration of the COVID-19 declaration under Section 564(b)(1) of the Act, 21 U.S.C. section 360bbb-3(b)(1), unless the authorization is terminated or revoked.  Performed at [facility], [place] [address] [place], [place] [address]   Urine culture     Status: Abnormal   Collection Time: 03/27/21  2:40 AM   Specimen: Urine, Random  Result Value Ref Range Status   Specimen Description   Final    URINE, RANDOM Performed at [facility] [address], [place], [place] [address]    Special Requests   Final    NONE Performed at [facility], [place] [address] [place], [place] [address]    Culture MULTIPLE SPECIES PRESENT, SUGGEST RECOLLECTION (A)  Final   Report Status 03/28/2021 FINAL  Final  MRSA Next Gen by PCR, Nasal     Status: None   Collection Time: 03/28/21  9:32 AM   Specimen: Nasal Mucosa; Nasal Swab  Result Value Ref Range Status   MRSA by PCR Next Gen NOT DETECTED NOT DETECTED Final    Comment: (NOTE) The GeneXpert MRSA Assay (FDA approved for NASAL specimens only), is one component of a comprehensive MRSA colonization surveillance program. It is not intended to diagnose MRSA infection nor to guide or monitor treatment for MRSA infections. Test performance is not FDA approved in patients less than 84 years old. Performed at [facility], [place] [address], [place], [place] [address]          Radiology Studies: No results found.      Scheduled Meds:  Chlorhexidine Gluconate Cloth  6 each Topical Daily   enoxaparin (LOVENOX) injection  40 mg Subcutaneous Q24H   insulin aspart  0-9  Units Subcutaneous Q4H   insulin detemir  10 Units Subcutaneous BID   insulin starter kit- pen needles  1 kit Other Once   pantoprazole (PROTONIX) IV  40 mg Intravenous Q12H   potassium chloride  40 mEq Oral TID   Continuous Infusions:  sodium chloride 10 mL/hr at 03/29/21 0824   lactated ringers 125 mL/hr at 03/29/21 0824   potassium PHOSPHATE IVPB (in mmol) 20 mmol (03/29/21 0850)     LOS: 2 days    Time spent: 30 minutes  Barb Merino, MD [facility] Pager [phone]

## 2021-03-29 NOTE — Progress Notes (Signed)
AVS summary discussed with pt and her husband per orders. All questions answered at this time. Education and care plan completed. All patient belongings returned. PIVs removed by RN. Patient was taken downstairs in the wheelchair by this RN.

## 2021-03-29 NOTE — Progress Notes (Signed)
Inpatient Diabetes Program Recommendations  AACE/ADA: New Consensus Statement on Inpatient Glycemic Control (2015)  Target Ranges:  Prepandial:   less than 140 mg/dL      Peak postprandial:   less than 180 mg/dL (1-2 hours)      Critically ill patients:  140 - 180 mg/dL   Lab Results  Component Value Date   GLUCAP 171 (H) 03/29/2021   HGBA1C 12.8 (H) 03/26/2021    Review of Glycemic Control   Current orders for Inpatient glycemic control: Levemir 10 units BID, Novolog 0-9 units Q4  Inpatient Diabetes Program Recommendations:    Educated patient and spouse on insulin pen use at home. Reviewed contents of insulin flexpen starter kit. Reviewed all steps if insulin pen including attachment of needle, 2-unit air shot, dialing up dose, giving injection, removing needle, disposal of sharps, storage of unused insulin, disposal of insulin etc. Patient able to provide successful return demonstration. Also reviewed troubleshooting with insulin pen. MD to give patient Rxs for insulin pens and insulin pen needles.   Discussed HgbA1C of 12.8% and importance of controlling blood sugars at home. Pt states she will f/u with PCP.   Thank you. Lorenda Peck, RD, LDN, CDE Inpatient Diabetes Coordinator [phone]

## 2021-11-27 IMAGING — CT CT ABD-PELV W/ CM
2 of 5 series · 14 of 46 positions shown, 16 images · IV contrast (APPLIED)
Comparison: None.

CLINICAL DATA: Nausea and vomiting.  COVID positive.

EXAM:
CT ANGIOGRAPHY CHEST
CT ABDOMEN AND PELVIS WITH CONTRAST
TECHNIQUE: Multidetector CT imaging of the chest was performed using the
standard protocol during bolus administration of intravenous
contrast. Multiplanar CT image reconstructions and MIPs were
obtained to evaluate the vascular anatomy. Multidetector CT imaging
of the abdomen and pelvis was performed using the standard protocol
during bolus administration of intravenous contrast.
CONTRAST:  80mL OMNIPAQUE IOHEXOL 350 MG/ML SOLN

[Series 4: axial st · axial · 0.69mm/px · z∈[-370,+75]mm · 11 of 103 slices shown, 13 images]
[im 7/103  soft-tissue]
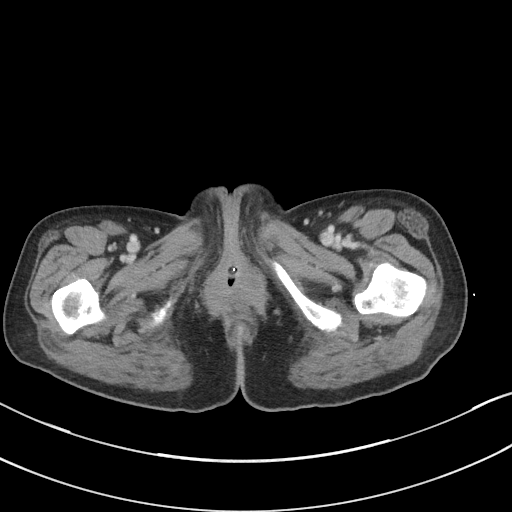
[im 7/103  bone]
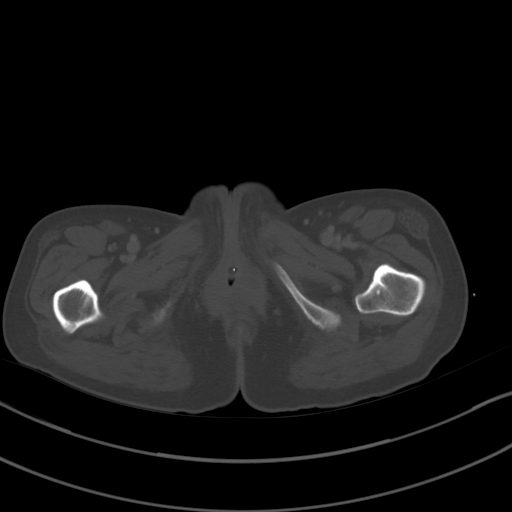
[im 20/103  soft-tissue]
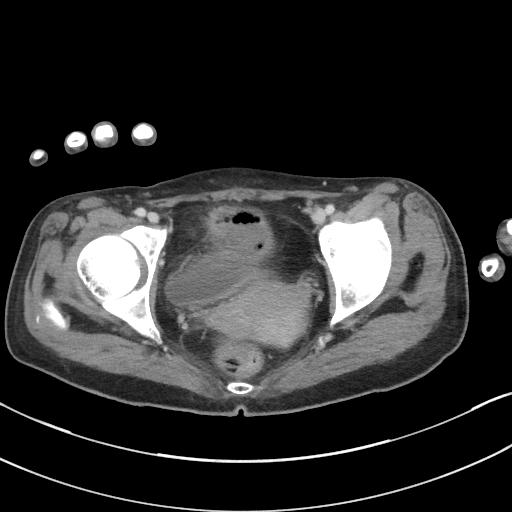
[im 26/103  soft-tissue]
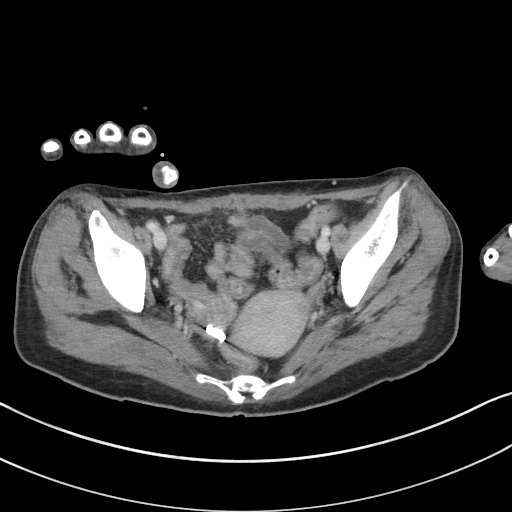
[im 32/103  soft-tissue]
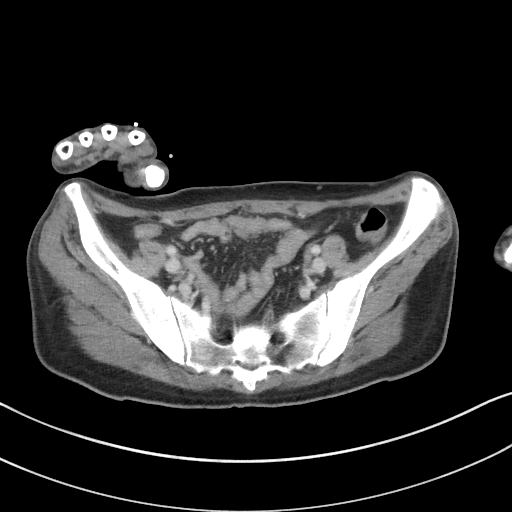
[im 45/103  soft-tissue]
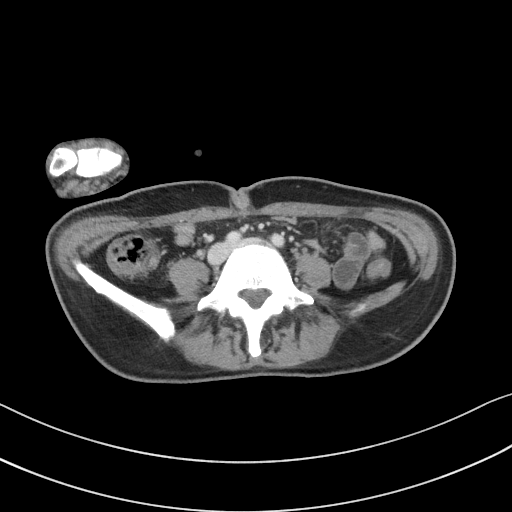
[im 52/103  soft-tissue]
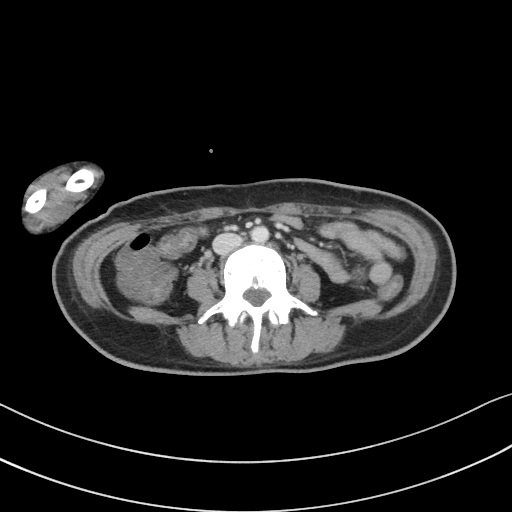
[im 58/103  soft-tissue]
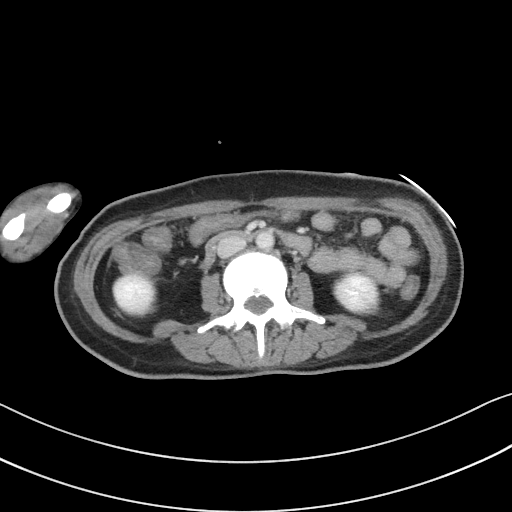
[im 71/103  soft-tissue]
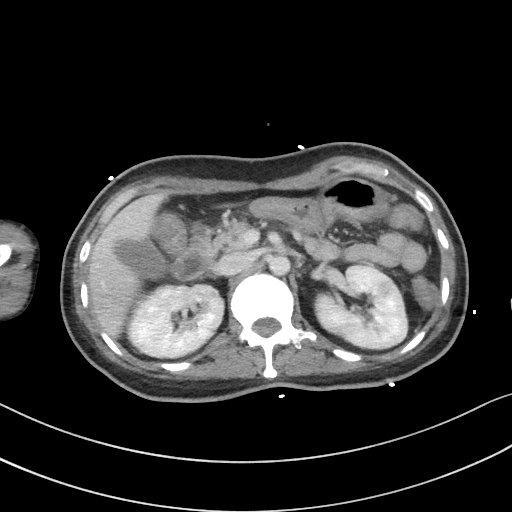
[im 77/103  soft-tissue]
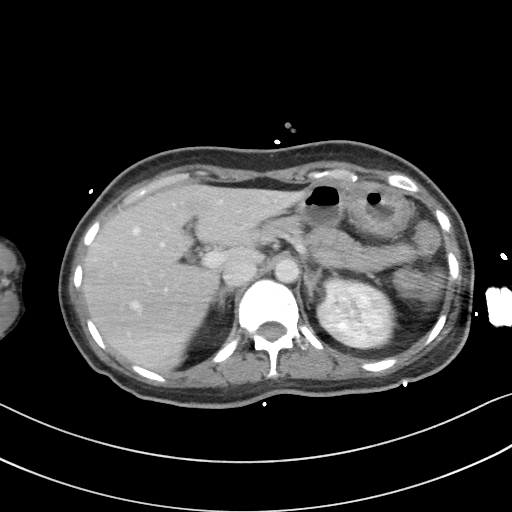
[im 77/103  bone]
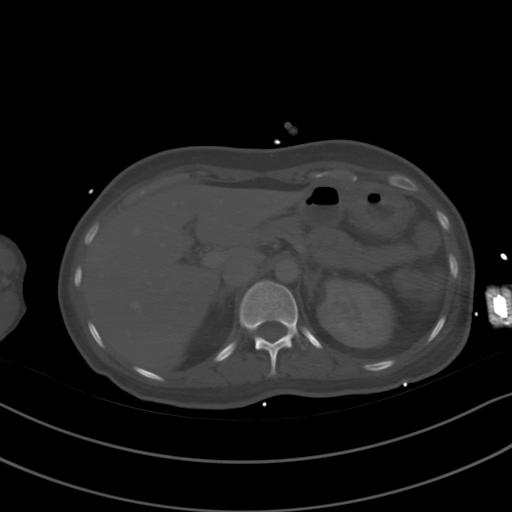
[im 83/103  soft-tissue]
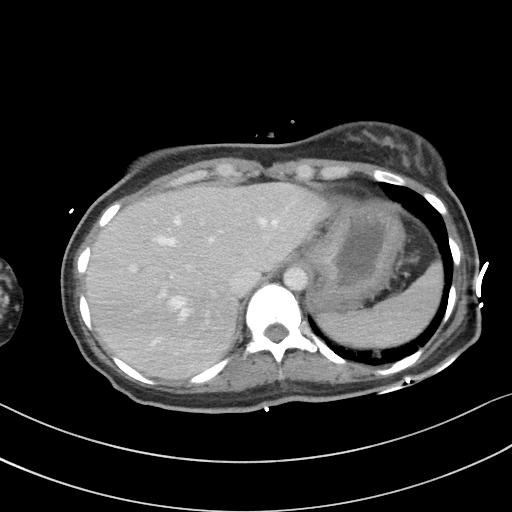
[im 96/103  soft-tissue]
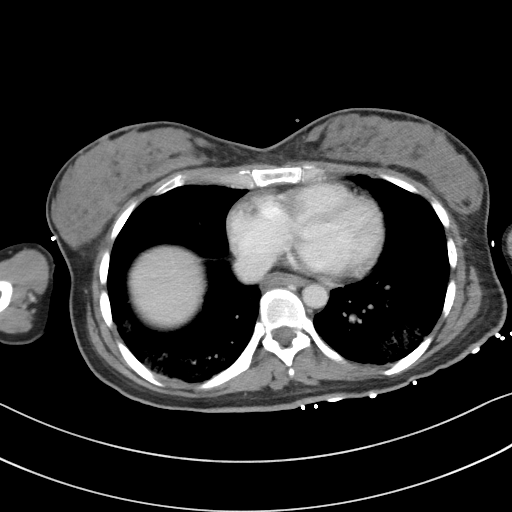

[Series 8: coronal st · coronal · 0.68mm/px · 3 of 66 slices shown]
[im 22/66  soft-tissue]
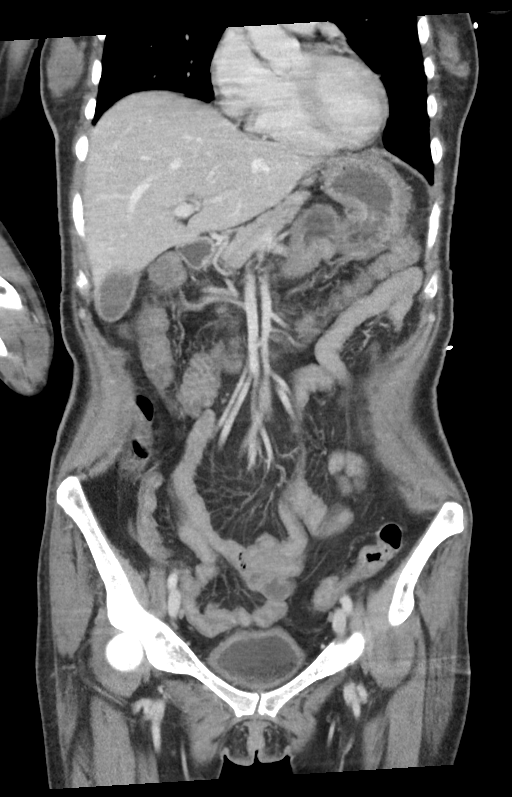
[im 29/66  soft-tissue]
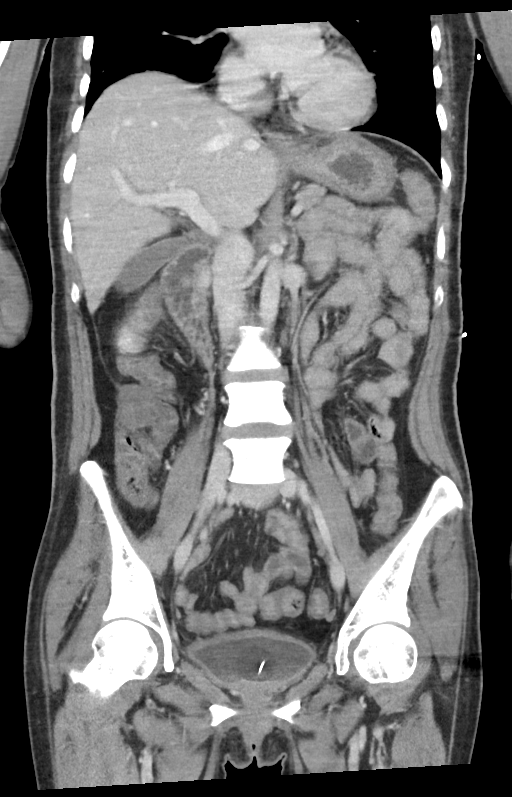
[im 37/66  soft-tissue]
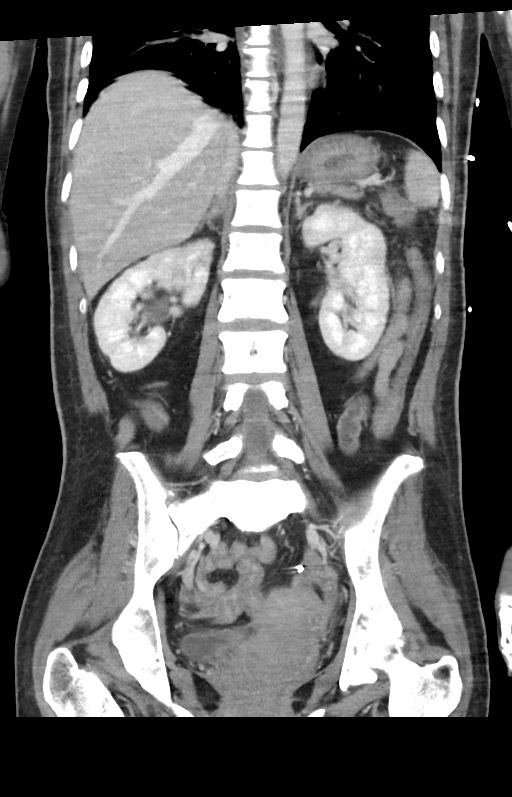

[14 of 46 positions shown; findings below may reference images not displayed]

FINDINGS: CTA CHEST FINDINGS

Cardiovascular: Contrast injection is sufficient to demonstrate
satisfactory opacification of the pulmonary arteries to the
segmental level. There is no pulmonary embolus or evidence of right
heart strain. The size of the main pulmonary artery is normal. Heart
size is normal, with no pericardial effusion. The course and caliber
of the aorta are normal. There is no atherosclerotic calcification.
Opacification decreased due to pulmonary arterial phase contrast
bolus timing.

Mediastinum/Nodes:

-- No mediastinal lymphadenopathy.

-- No hilar lymphadenopathy.

-- No axillary lymphadenopathy.

-- No supraclavicular lymphadenopathy.

-- Normal thyroid gland where visualized.

-is mild diffuse esophageal wall thickening.

Lungs/Pleura: Diffuse hazy bilateral ground-glass airspace opacities
are noted. There is no pneumothorax or large pleural effusion. The
trachea is unremarkable.

Musculoskeletal: No chest wall abnormality. No bony spinal canal
stenosis.

Review of the MIP images confirms the above findings.

CT ABDOMEN and PELVIS FINDINGS

Hepatobiliary: The liver is normal. Normal gallbladder.There is no
biliary ductal dilation.

Pancreas: Normal contours without ductal dilatation. No
peripancreatic fluid collection.

Spleen: Unremarkable.

Adrenals/Urinary Tract:

--Adrenal glands: Unremarkable.

--Right kidney/ureter: No hydronephrosis or radiopaque kidney
stones.

--Left kidney/ureter: No hydronephrosis or radiopaque kidney stones.

--Urinary bladder: There is a Foley catheter within the urinary
bladder.

Stomach/Bowel:

--Stomach/Duodenum: No hiatal hernia or other gastric abnormality.
Normal duodenal course and caliber.

--Small bowel: Unremarkable.

--Colon: There is diffuse wall thickening of virtually all of the
colon. The colon is underdistended.

--Appendix: Normal.

Vascular/Lymphatic: Normal course and caliber of the major abdominal
vessels.

--No retroperitoneal lymphadenopathy.

--No mesenteric lymphadenopathy.

--No pelvic or inguinal lymphadenopathy.

Reproductive: Unremarkable

Other: No ascites or free air. The abdominal wall is normal.

Musculoskeletal. No acute displaced fractures.

Review of the MIP images confirms the above findings.
IMPRESSION: 1. No acute pulmonary embolism.
2. Diffuse hazy bilateral ground-glass airspace opacities,
consistent with the patient's history of viral pneumonia.
3. Diffuse wall thickening of virtually all of the colon. This may
be secondary to underdistention versus colitis. Correlation with the
patient's symptoms is recommended.
4. Mild diffuse esophageal wall thickening, suggestive of
esophagitis.

## 2023-11-18 ENCOUNTER — Emergency Department (HOSPITAL_COMMUNITY): Payer: Self-pay

## 2023-11-18 ENCOUNTER — Emergency Department (HOSPITAL_COMMUNITY)
Admission: EM | Admit: 2023-11-18 | Discharge: 2023-11-18 | Disposition: A | Discharge status: Home / Self Care | Payer: Self-pay | Attending: Emergency Medicine | Admitting: Emergency Medicine

## 2023-11-18 ENCOUNTER — Encounter (HOSPITAL_COMMUNITY): Payer: Self-pay | Admitting: Emergency Medicine

## 2023-11-18 DIAGNOSIS — Z7984 Long term (current) use of oral hypoglycemic drugs: Secondary | ICD-10-CM | POA: Insufficient documentation

## 2023-11-18 DIAGNOSIS — E1165 Type 2 diabetes mellitus with hyperglycemia: Secondary | ICD-10-CM | POA: Insufficient documentation

## 2023-11-18 DIAGNOSIS — R739 Hyperglycemia, unspecified: Secondary | ICD-10-CM

## 2023-11-18 DIAGNOSIS — N12 Tubulo-interstitial nephritis, not specified as acute or chronic: Secondary | ICD-10-CM | POA: Insufficient documentation

## 2023-11-18 DIAGNOSIS — R059 Cough, unspecified: Secondary | ICD-10-CM | POA: Insufficient documentation

## 2023-11-18 LAB — I-STAT CHEM 8, ED
BUN: 15 mg/dL (ref 6–20)
Calcium, Ion: 1.17 mmol/L (ref 1.15–1.40)
Chloride: 98 mmol/L (ref 98–111)
Creatinine, Ser: 0.8 mg/dL (ref 0.44–1.00)
Glucose, Bld: 325 mg/dL — ABNORMAL HIGH (ref 70–99)
HCT: 37 % (ref 36.0–46.0)
Hemoglobin: 12.6 g/dL (ref 12.0–15.0)
Potassium: 4.3 mmol/L (ref 3.5–5.1)
Sodium: 134 mmol/L — ABNORMAL LOW (ref 135–145)
TCO2: 25 mmol/L (ref 22–32)

## 2023-11-18 LAB — COMPREHENSIVE METABOLIC PANEL
ALT: 11 U/L (ref 0–44)
AST: 17 U/L (ref 15–41)
Albumin: 2.9 g/dL — ABNORMAL LOW (ref 3.5–5.0)
Alkaline Phosphatase: 61 U/L (ref 38–126)
Anion gap: 10 (ref 5–15)
BUN: 14 mg/dL (ref 6–20)
CO2: 26 mmol/L (ref 22–32)
Calcium: 9.2 mg/dL (ref 8.9–10.3)
Chloride: 97 mmol/L — ABNORMAL LOW (ref 98–111)
Creatinine, Ser: 0.88 mg/dL (ref 0.44–1.00)
GFR, Estimated: >60 mL/min (ref >60)
Glucose, Bld: 344 mg/dL — ABNORMAL HIGH (ref 70–99)
Potassium: 4.3 mmol/L (ref 3.5–5.1)
Sodium: 133 mmol/L — ABNORMAL LOW (ref 135–145)
Total Bilirubin: 0.9 mg/dL (ref 0.0–1.2)
Total Protein: 7 g/dL (ref 6.5–8.1)

## 2023-11-18 LAB — RESP PANEL BY RT-PCR (RSV, FLU A&B, COVID)  RVPGX2
Influenza A by PCR: NEGATIVE
Influenza B by PCR: NEGATIVE
Resp Syncytial Virus by PCR: NEGATIVE
SARS Coronavirus 2 by RT PCR: NEGATIVE

## 2023-11-18 LAB — URINALYSIS, ROUTINE W REFLEX MICROSCOPIC
Bilirubin Urine: NEGATIVE
Glucose, UA: >=500 mg/dL — AB
Ketones, ur: 20 mg/dL — AB
Nitrite: POSITIVE — AB
Protein, ur: 100 mg/dL — AB
Specific Gravity, Urine: 1.02 (ref 1.005–1.030)
WBC, UA: >50 WBC/hpf (ref 0–5)
pH: 5 (ref 5.0–8.0)

## 2023-11-18 LAB — TROPONIN I (HIGH SENSITIVITY): Troponin I (High Sensitivity): 11 ng/L (ref <18)

## 2023-11-18 LAB — CBG MONITORING, ED: Glucose-Capillary: 316 mg/dL — ABNORMAL HIGH (ref 70–99)

## 2023-11-18 LAB — CBC
HCT: 35.3 % — ABNORMAL LOW (ref 36.0–46.0)
Hemoglobin: 11.6 g/dL — ABNORMAL LOW (ref 12.0–15.0)
MCH: 29.1 pg (ref 26.0–34.0)
MCHC: 32.9 g/dL (ref 30.0–36.0)
MCV: 88.7 fL (ref 80.0–100.0)
Platelets: 489 10*3/uL — ABNORMAL HIGH (ref 150–400)
RBC: 3.98 MIL/uL (ref 3.87–5.11)
RDW: 12.7 % (ref 11.5–15.5)
WBC: 13.2 10*3/uL — ABNORMAL HIGH (ref 4.0–10.5)
nRBC: 0 % (ref 0.0–0.2)

## 2023-11-18 LAB — HCG, SERUM, QUALITATIVE: Preg, Serum: NEGATIVE

## 2023-11-18 MED ORDER — CEPHALEXIN 500 MG PO CAPS: 500.0000 mg | ORAL_CAPSULE | Freq: Two times a day (BID) | ORAL | 0 refills | Status: AC

## 2023-11-18 MED ORDER — SODIUM CHLORIDE 0.9 % IV BOLUS
1000.0000 mL | Freq: Once | INTRAVENOUS | Status: AC
  Administered 2023-11-18: 1000 mL via INTRAVENOUS

## 2023-11-18 MED ORDER — ONDANSETRON 4 MG PO TBDP: 4.0000 mg | ORAL_TABLET | Freq: Three times a day (TID) | ORAL | 0 refills | Status: DC | PRN

## 2023-11-18 MED ORDER — SODIUM CHLORIDE 0.9 % IV SOLN
1.0000 g | Freq: Once | INTRAVENOUS | Status: AC
  Administered 2023-11-18: 1 g via INTRAVENOUS
  Filled 2023-11-18: qty 10

## 2023-11-18 MED ORDER — METFORMIN HCL 1000 MG PO TABS: 500.0000 mg | ORAL_TABLET | Freq: Two times a day (BID) | ORAL | 2 refills | Status: DC

## 2023-11-18 NOTE — ED Provider Notes (Signed)
 [place] EMERGENCY DEPARTMENT AT [facility] Provider Note   CSN: [id number] Arrival date & time: 11/18/23  1930     History {Add pertinent medical, surgical, social history, OB history to HPI:1} Chief Complaint  Patient presents with   Weakness    Shelby Brown is a 43 y.o. female.  History obtained via spanish interpreter.  43 yo F with hx of IDDM who presents with vomiting, body aches, and cough. Also fever. Unsure of how high her temp was. Also with dysuria. No vaginal dc. Having lower back pain. Also some diarrhea. No known sick contacts. Has been out of her diabetes medications for several years. Says that she is supposed to be on insulin but doesn't want to.        Home Medications Prior to Admission medications   Medication Sig Start Date End Date Taking? Authorizing Provider  blood glucose meter kit and supplies KIT Dispense based on patient and insurance preference. Use up to four times daily as directed. (FOR ICD-9 250.00, 250.01). 09/13/20   Azucena Fallen, MD  famotidine (PEPCID) 20 MG tablet Take 1 tablet (20 mg total) by mouth 2 (two) times daily. 03/29/21 03/29/22  Dorcas Carrow, MD  insulin isophane & regular human (NOVOLIN 70/30 FLEXPEN RELION) (70-30) 100 UNIT/ML KwikPen Inject 10 Units into the skin 2 (two) times daily. 03/29/21   Dorcas Carrow, MD  Insulin Syringes, Disposable, U-100 0.5 ML MISC 1 Package by Does not apply route in the morning and at bedtime. 09/13/20   Azucena Fallen, MD  metFORMIN (GLUCOPHAGE) 1000 MG tablet Take 1 tablet (1,000 mg total) by mouth 2 (two) times daily with a meal. 03/29/21   Dorcas Carrow, MD  metoCLOPramide (REGLAN) 10 MG tablet Take 1 tablet (10 mg total) by mouth every 6 (six) hours as needed for up to 7 days for nausea. 03/29/21 04/05/21  Dorcas Carrow, MD  potassium chloride SA (KLOR-CON) 20 MEQ tablet Take 1 tablet (20 mEq total) by mouth daily for 7 days. 03/29/21 04/05/21  Dorcas Carrow, MD   promethazine (PHENERGAN) 6.25 MG/5ML syrup Take 5 mLs (6.25 mg total) by mouth 4 (four) times daily as needed for nausea or vomiting. Patient not taking: Reported on 11/16/2020 09/13/20 11/19/20  Azucena Fallen, MD      Allergies    Patient has no known allergies.    Review of Systems   Review of Systems  Physical Exam Updated Vital Signs BP 103/77 (BP Location: Right Arm)   Pulse 95   Temp 98.4 F (36.9 C)   Resp 16   SpO2 100%  Physical Exam Vitals and nursing note reviewed.  Constitutional:      General: She is not in acute distress.    Appearance: She is well-developed.  HENT:     Head: Normocephalic and atraumatic.     Right Ear: External ear normal.     Left Ear: External ear normal.     Nose: Nose normal.  Eyes:     Extraocular Movements: Extraocular movements intact.     Conjunctiva/sclera: Conjunctivae normal.     Pupils: Pupils are equal, round, and reactive to light.  Cardiovascular:     Rate and Rhythm: Normal rate and regular rhythm.     Heart sounds: No murmur heard. Pulmonary:     Effort: Pulmonary effort is normal. No respiratory distress.     Breath sounds: Normal breath sounds.  Abdominal:     General: Abdomen is flat. There is no distension.  Palpations: Abdomen is soft. There is no mass.     Tenderness: There is no abdominal tenderness. There is right CVA tenderness and left CVA tenderness. There is no guarding.  Musculoskeletal:     Cervical back: Normal range of motion and neck supple.     Right lower leg: No edema.     Left lower leg: No edema.  Skin:    General: Skin is warm and dry.  Neurological:     Mental Status: She is alert and oriented to person, place, and time. Mental status is at baseline.  Psychiatric:        Mood and Affect: Mood normal.     ED Results / Procedures / Treatments   Labs (all labs ordered are listed, but only abnormal results are displayed) Labs Reviewed  CBC - Abnormal; Notable for the following  components:      Result Value   WBC 13.2 (*)    Hemoglobin 11.6 (*)    HCT 35.3 (*)    Platelets 489 (*)    All other components within normal limits  URINALYSIS, ROUTINE W REFLEX MICROSCOPIC - Abnormal; Notable for the following components:   APPearance CLOUDY (*)    Glucose, UA >=500 (*)    Hgb urine dipstick MODERATE (*)    Ketones, ur 20 (*)    Protein, ur 100 (*)    Nitrite POSITIVE (*)    Leukocytes,Ua MODERATE (*)    Bacteria, UA MANY (*)    All other components within normal limits  CBG MONITORING, ED - Abnormal; Notable for the following components:   Glucose-Capillary 316 (*)    All other components within normal limits  I-STAT CHEM 8, ED - Abnormal; Notable for the following components:   Sodium 134 (*)    Glucose, Bld 325 (*)    All other components within normal limits  HCG, SERUM, QUALITATIVE  COMPREHENSIVE METABOLIC PANEL  CBG MONITORING, ED  TROPONIN I (HIGH SENSITIVITY)    EKG None  Radiology No results found.  Procedures Procedures  {Document cardiac monitor, telemetry assessment procedure when appropriate:1}  Medications Ordered in ED Medications - No data to display  ED Course/ Medical Decision Making/ A&P   {   Click here for ABCD2, HEART and other calculatorsREFRESH Note before signing :1}                              Medical Decision Making Amount and/or Complexity of Data Reviewed Labs: ordered. Radiology: ordered.   ***  {Document critical care time when appropriate:1} {Document review of labs and clinical decision tools ie heart score, Chads2Vasc2 etc:1}  {Document your independent review of radiology images, and any outside records:1} {Document your discussion with family members, caretakers, and with consultants:1} {Document social determinants of health affecting pt's care:1} {Document your decision making why or why not admission, treatments were needed:1} Final Clinical Impression(s) / ED Diagnoses Final diagnoses:  None     Rx / DC Orders ED Discharge Orders     None

## 2023-11-18 NOTE — Discharge Instructions (Addendum)
 Lo atendieron por su infeccin urinaria y renal (pielonefritis) en el departamento de emergencias.  Su nivel de azcar en sangre tambin estaba muy alto.  En casa, tome el antibitico para la infeccin de rin y vejiga.  Tome Zofran para las nuseas o los vmitos.  Tome metformina para su nivel de Production assistant, radio.    Consulte su MyChart en lnea para conocer los resultados de cualquier prueba que no haya dado resultado cuando sali del departamento de Sports administrator.   Haga un seguimiento con su mdico de atencin primaria en 2-3 das con respecto a su visita.  Hable con ellos sobre su rgimen de [place], ya que puede ser peligroso o incluso mortal no tomar la medicacin adecuada para su diabetes.  Regrese inmediatamente al departamento de emergencias si experimenta cualquiera de los siguientes sntomas: empeoramiento de los sntomas, dificultad para respirar, sed intensa o cualquier otro sntoma preocupante.    Gracias por visitar nuestro Departamento de [address]. Fue un placer atenderte [employer].  ---  You were seen for your urinary and kidney infection (pyelonephritis) in the emergency department.  Your blood sugar was also very high.  At home, please take the antibiotic for your kidney and bladder infection.  Take the Zofran for any nausea or vomiting.  Take the metformin for your blood sugar.    Check your MyChart online for the results of any tests that had not resulted by the time you left the emergency department.   Follow-up with your primary doctor in 2-3 days regarding your visit.  Please talk to them about your insulin regimen since it can be dangerous or even deadly to not take the proper medication for your diabetes.  Return immediately to the emergency department if you experience any of the following: Worsening symptoms, shortness of breath, severe thirst, or any other concerning symptoms.    Thank you for visiting our Emergency Department. It was a pleasure taking care of you  today.

## 2023-11-18 NOTE — ED Triage Notes (Signed)
 Pt here from home with c/o generally not feeling well , has not had any of her DM meds in about two years , comes in with c/o body aches cp , n/v and headaches

## 2023-11-21 LAB — URINE CULTURE: Culture: >=100000 — AB

## 2023-11-22 ENCOUNTER — Telehealth (HOSPITAL_BASED_OUTPATIENT_CLINIC_OR_DEPARTMENT_OTHER): Payer: Self-pay | Admitting: *Deleted

## 2023-11-22 NOTE — Telephone Encounter (Signed)
 Post ED Visit - Positive Culture Follow-up  Culture report reviewed by antimicrobial stewardship pharmacist: [facility] [x]  Lennie Muckle, Pharm.D. []  Celedonio Miyamoto, Pharm.D., BCPS AQ-ID []  Garvin Fila, Pharm.D., BCPS []  Georgina Pillion, [address].D., BCPS []  [place], [address].D., BCPS, AAHIVP []  Estella Husk, Pharm.D., BCPS, AAHIVP []  Lysle Pearl, PharmD, BCPS []  Phillips Climes, PharmD, BCPS []  Agapito Games, PharmD, BCPS []  Verlan Friends, PharmD []  Mervyn Gay, PharmD, BCPS []  Vinnie Level, PharmD  [facility] []  Len Childs, PharmD []  Greer Pickerel, PharmD []  Adalberto Cole, PharmD []  Perlie Gold, Rph []  Lonell Face) Jean Rosenthal, PharmD []  Earl Many, PharmD []  Junita Push, PharmD []  Dorna Leitz, PharmD []  Terrilee Files, PharmD []  Lynann Beaver, PharmD []  Keturah Barre, PharmD []  Loralee Pacas, PharmD []  Bernadene Person, PharmD   Positive urine culture Treated with cephalexin, organism sensitive to the same and no further patient follow-up is required at this time.  Shelby Brown 11/22/2023, 10:39 AM

## 2023-12-25 ENCOUNTER — Other Ambulatory Visit: Payer: Self-pay

## 2023-12-25 ENCOUNTER — Other Ambulatory Visit (HOSPITAL_COMMUNITY): Payer: Self-pay

## 2023-12-25 ENCOUNTER — Encounter (HOSPITAL_COMMUNITY): Payer: Self-pay | Admitting: Emergency Medicine

## 2023-12-25 ENCOUNTER — Inpatient Hospital Stay (HOSPITAL_COMMUNITY)
Admission: EM | Admit: 2023-12-25 | Discharge: 2023-12-29 | DRG: 638 | Disposition: A | Discharge status: Home / Self Care | Payer: Self-pay | Attending: Family Medicine | Admitting: Family Medicine

## 2023-12-25 ENCOUNTER — Emergency Department (HOSPITAL_COMMUNITY): Payer: Self-pay

## 2023-12-25 DIAGNOSIS — R519 Headache, unspecified: Secondary | ICD-10-CM

## 2023-12-25 DIAGNOSIS — M25512 Pain in left shoulder: Secondary | ICD-10-CM | POA: Diagnosis present

## 2023-12-25 DIAGNOSIS — N136 Pyonephrosis: Secondary | ICD-10-CM | POA: Diagnosis present

## 2023-12-25 DIAGNOSIS — Y92009 Unspecified place in unspecified non-institutional (private) residence as the place of occurrence of the external cause: Secondary | ICD-10-CM

## 2023-12-25 DIAGNOSIS — R202 Paresthesia of skin: Secondary | ICD-10-CM | POA: Diagnosis present

## 2023-12-25 DIAGNOSIS — D649 Anemia, unspecified: Secondary | ICD-10-CM | POA: Diagnosis present

## 2023-12-25 DIAGNOSIS — H53462 Homonymous bilateral field defects, left side: Secondary | ICD-10-CM

## 2023-12-25 DIAGNOSIS — E1165 Type 2 diabetes mellitus with hyperglycemia: Secondary | ICD-10-CM

## 2023-12-25 DIAGNOSIS — D72829 Elevated white blood cell count, unspecified: Secondary | ICD-10-CM | POA: Diagnosis present

## 2023-12-25 DIAGNOSIS — E1100 Type 2 diabetes mellitus with hyperosmolarity without nonketotic hyperglycemic-hyperosmolar coma (NKHHC): Principal | ICD-10-CM | POA: Diagnosis present

## 2023-12-25 DIAGNOSIS — G8929 Other chronic pain: Secondary | ICD-10-CM | POA: Diagnosis present

## 2023-12-25 DIAGNOSIS — T383X6A Underdosing of insulin and oral hypoglycemic [antidiabetic] drugs, initial encounter: Secondary | ICD-10-CM | POA: Diagnosis present

## 2023-12-25 DIAGNOSIS — R000 Tachycardia, unspecified: Secondary | ICD-10-CM | POA: Diagnosis present

## 2023-12-25 DIAGNOSIS — G44209 Tension-type headache, unspecified, not intractable: Secondary | ICD-10-CM | POA: Diagnosis present

## 2023-12-25 DIAGNOSIS — K219 Gastro-esophageal reflux disease without esophagitis: Secondary | ICD-10-CM | POA: Diagnosis present

## 2023-12-25 DIAGNOSIS — F329 Major depressive disorder, single episode, unspecified: Secondary | ICD-10-CM | POA: Diagnosis present

## 2023-12-25 DIAGNOSIS — Z7984 Long term (current) use of oral hypoglycemic drugs: Secondary | ICD-10-CM

## 2023-12-25 DIAGNOSIS — B9620 Unspecified Escherichia coli [E. coli] as the cause of diseases classified elsewhere: Secondary | ICD-10-CM | POA: Diagnosis present

## 2023-12-25 DIAGNOSIS — R079 Chest pain, unspecified: Secondary | ICD-10-CM | POA: Insufficient documentation

## 2023-12-25 DIAGNOSIS — N12 Tubulo-interstitial nephritis, not specified as acute or chronic: Principal | ICD-10-CM | POA: Diagnosis present

## 2023-12-25 DIAGNOSIS — N852 Hypertrophy of uterus: Secondary | ICD-10-CM | POA: Diagnosis present

## 2023-12-25 DIAGNOSIS — E1140 Type 2 diabetes mellitus with diabetic neuropathy, unspecified: Secondary | ICD-10-CM | POA: Diagnosis present

## 2023-12-25 DIAGNOSIS — Z91128 Patient's intentional underdosing of medication regimen for other reason: Secondary | ICD-10-CM

## 2023-12-25 DIAGNOSIS — Z833 Family history of diabetes mellitus: Secondary | ICD-10-CM

## 2023-12-25 DIAGNOSIS — R071 Chest pain on breathing: Secondary | ICD-10-CM | POA: Diagnosis present

## 2023-12-25 DIAGNOSIS — H5462 Unqualified visual loss, left eye, normal vision right eye: Secondary | ICD-10-CM | POA: Diagnosis present

## 2023-12-25 DIAGNOSIS — M25552 Pain in left hip: Secondary | ICD-10-CM | POA: Diagnosis present

## 2023-12-25 DIAGNOSIS — H547 Unspecified visual loss: Secondary | ICD-10-CM

## 2023-12-25 DIAGNOSIS — I7389 Other specified peripheral vascular diseases: Secondary | ICD-10-CM

## 2023-12-25 DIAGNOSIS — R109 Unspecified abdominal pain: Secondary | ICD-10-CM | POA: Diagnosis present

## 2023-12-25 DIAGNOSIS — Z789 Other specified health status: Secondary | ICD-10-CM

## 2023-12-25 DIAGNOSIS — E860 Dehydration: Secondary | ICD-10-CM | POA: Diagnosis present

## 2023-12-25 HISTORY — DX: Type 2 diabetes mellitus with hyperosmolarity without nonketotic hyperglycemic-hyperosmolar coma (NKHHC): E11.00

## 2023-12-25 HISTORY — DX: Chest pain, unspecified: R07.9

## 2023-12-25 LAB — BASIC METABOLIC PANEL WITH GFR
Anion gap: 10 (ref 5–15)
BUN: 21 mg/dL — ABNORMAL HIGH (ref 6–20)
CO2: 25 mmol/L (ref 22–32)
Calcium: 8.4 mg/dL — ABNORMAL LOW (ref 8.9–10.3)
Chloride: 101 mmol/L (ref 98–111)
Creatinine, Ser: 1.21 mg/dL — ABNORMAL HIGH (ref 0.44–1.00)
GFR, Estimated: 57 mL/min — ABNORMAL LOW (ref >60)
Glucose, Bld: 151 mg/dL — ABNORMAL HIGH (ref 70–99)
Potassium: 3.7 mmol/L (ref 3.5–5.1)
Sodium: 136 mmol/L (ref 135–145)

## 2023-12-25 LAB — COMPREHENSIVE METABOLIC PANEL WITH GFR
ALT: 18 U/L (ref 0–44)
AST: 17 U/L (ref 15–41)
Albumin: 3.3 g/dL — ABNORMAL LOW (ref 3.5–5.0)
Alkaline Phosphatase: 84 U/L (ref 38–126)
Anion gap: 12 (ref 5–15)
BUN: 19 mg/dL (ref 6–20)
CO2: 24 mmol/L (ref 22–32)
Calcium: 9.3 mg/dL (ref 8.9–10.3)
Chloride: 93 mmol/L — ABNORMAL LOW (ref 98–111)
Creatinine, Ser: 0.96 mg/dL (ref 0.44–1.00)
GFR, Estimated: >60 mL/min (ref >60)
Glucose, Bld: 676 mg/dL (ref 70–99)
Potassium: 3.9 mmol/L (ref 3.5–5.1)
Sodium: 129 mmol/L — ABNORMAL LOW (ref 135–145)
Total Bilirubin: 1 mg/dL (ref 0.0–1.2)
Total Protein: 7.1 g/dL (ref 6.5–8.1)

## 2023-12-25 LAB — CBC
HCT: 34.3 % — ABNORMAL LOW (ref 36.0–46.0)
Hemoglobin: 11.5 g/dL — ABNORMAL LOW (ref 12.0–15.0)
MCH: 28.6 pg (ref 26.0–34.0)
MCHC: 33.5 g/dL (ref 30.0–36.0)
MCV: 85.3 fL (ref 80.0–100.0)
Platelets: 339 10*3/uL (ref 150–400)
RBC: 4.02 MIL/uL (ref 3.87–5.11)
RDW: 11.5 % (ref 11.5–15.5)
WBC: 20.2 10*3/uL — ABNORMAL HIGH (ref 4.0–10.5)
nRBC: 0 % (ref 0.0–0.2)

## 2023-12-25 LAB — CBG MONITORING, ED
Glucose-Capillary: 136 mg/dL — ABNORMAL HIGH (ref 70–99)
Glucose-Capillary: 139 mg/dL — ABNORMAL HIGH (ref 70–99)
Glucose-Capillary: 141 mg/dL — ABNORMAL HIGH (ref 70–99)
Glucose-Capillary: 151 mg/dL — ABNORMAL HIGH (ref 70–99)
Glucose-Capillary: 158 mg/dL — ABNORMAL HIGH (ref 70–99)
Glucose-Capillary: 189 mg/dL — ABNORMAL HIGH (ref 70–99)
Glucose-Capillary: 196 mg/dL — ABNORMAL HIGH (ref 70–99)
Glucose-Capillary: 214 mg/dL — ABNORMAL HIGH (ref 70–99)
Glucose-Capillary: 288 mg/dL — ABNORMAL HIGH (ref 70–99)
Glucose-Capillary: 328 mg/dL — ABNORMAL HIGH (ref 70–99)
Glucose-Capillary: 386 mg/dL — ABNORMAL HIGH (ref 70–99)
Glucose-Capillary: 449 mg/dL — ABNORMAL HIGH (ref 70–99)
Glucose-Capillary: 514 mg/dL (ref 70–99)
Glucose-Capillary: 534 mg/dL (ref 70–99)

## 2023-12-25 LAB — I-STAT VENOUS BLOOD GAS, ED
Acid-Base Excess: 1 mmol/L (ref 0.0–2.0)
Bicarbonate: 26.9 mmol/L (ref 20.0–28.0)
Calcium, Ion: 1.18 mmol/L (ref 1.15–1.40)
HCT: 34 % — ABNORMAL LOW (ref 36.0–46.0)
Hemoglobin: 11.6 g/dL — ABNORMAL LOW (ref 12.0–15.0)
O2 Saturation: 99 %
Potassium: 4 mmol/L (ref 3.5–5.1)
Sodium: 130 mmol/L — ABNORMAL LOW (ref 135–145)
TCO2: 28 mmol/L (ref 22–32)
pCO2, Ven: 44.7 mmHg (ref 44–60)
pH, Ven: 7.387 (ref 7.25–7.43)
pO2, Ven: 128 mmHg — ABNORMAL HIGH (ref 32–45)

## 2023-12-25 LAB — URINALYSIS, ROUTINE W REFLEX MICROSCOPIC
Bilirubin Urine: NEGATIVE
Glucose, UA: >=500 mg/dL — AB
Ketones, ur: 5 mg/dL — AB
Leukocytes,Ua: NEGATIVE
Nitrite: NEGATIVE
Protein, ur: 100 mg/dL — AB
Specific Gravity, Urine: 1.025 (ref 1.005–1.030)
pH: 7 (ref 5.0–8.0)

## 2023-12-25 LAB — TROPONIN I (HIGH SENSITIVITY)
Troponin I (High Sensitivity): 5 ng/L (ref <18)
Troponin I (High Sensitivity): 5 ng/L (ref <18)

## 2023-12-25 LAB — PHOSPHORUS: Phosphorus: 3.1 mg/dL (ref 2.5–4.6)

## 2023-12-25 LAB — HCG, SERUM, QUALITATIVE: Preg, Serum: NEGATIVE

## 2023-12-25 LAB — BETA-HYDROXYBUTYRIC ACID: Beta-Hydroxybutyric Acid: 1.11 mmol/L — ABNORMAL HIGH (ref 0.05–0.27)

## 2023-12-25 LAB — HIV ANTIBODY (ROUTINE TESTING W REFLEX): HIV Screen 4th Generation wRfx: NONREACTIVE

## 2023-12-25 LAB — LIPASE, BLOOD: Lipase: 142 U/L — ABNORMAL HIGH (ref 11–51)

## 2023-12-25 LAB — OSMOLALITY: Osmolality: 310 mosm/kg — ABNORMAL HIGH (ref 275–295)

## 2023-12-25 LAB — MAGNESIUM: Magnesium: 1.7 mg/dL (ref 1.7–2.4)

## 2023-12-25 MED ORDER — ONDANSETRON 4 MG PO TBDP
4.0000 mg | ORAL_TABLET | Freq: Once | ORAL | Status: AC | PRN
  Administered 2023-12-25: 4 mg via ORAL
  Filled 2023-12-25: qty 1

## 2023-12-25 MED ORDER — POTASSIUM CHLORIDE CRYS ER 20 MEQ PO TBCR: 20.0000 meq | EXTENDED_RELEASE_TABLET | Freq: Once | ORAL | Status: DC

## 2023-12-25 MED ORDER — IOHEXOL 350 MG/ML SOLN
75.0000 mL | Freq: Once | INTRAVENOUS | Status: AC | PRN
Start: 2023-12-25 — End: 2023-12-25
  Administered 2023-12-25: 75 mL via INTRAVENOUS

## 2023-12-25 MED ORDER — POTASSIUM CHLORIDE 10 MEQ/100ML IV SOLN
10.0000 meq | INTRAVENOUS | Status: AC
  Administered 2023-12-25 – 2023-12-26 (×3): 10 meq via INTRAVENOUS
  Filled 2023-12-25 (×3): qty 100

## 2023-12-25 MED ORDER — LACTATED RINGERS IV BOLUS
1000.0000 mL | Freq: Once | INTRAVENOUS | Status: AC
  Administered 2023-12-25: 1000 mL via INTRAVENOUS

## 2023-12-25 MED ORDER — ONDANSETRON HCL 4 MG/2ML IJ SOLN: 4.0000 mg | Freq: Once | INTRAMUSCULAR | Status: DC

## 2023-12-25 MED ORDER — HYDROMORPHONE HCL 1 MG/ML IJ SOLN
0.5000 mg | INTRAMUSCULAR | Status: DC | PRN
  Administered 2023-12-25: 0.5 mg via INTRAVENOUS
  Filled 2023-12-25: qty 1

## 2023-12-25 MED ORDER — INSULIN ASPART 100 UNIT/ML IJ SOLN
0.0000 [IU] | Freq: Three times a day (TID) | INTRAMUSCULAR | Status: DC
  Administered 2023-12-26: 5 [IU] via SUBCUTANEOUS
  Administered 2023-12-26 (×2): 3 [IU] via SUBCUTANEOUS
  Administered 2023-12-27 (×2): 2 [IU] via SUBCUTANEOUS
  Administered 2023-12-27 – 2023-12-28 (×2): 3 [IU] via SUBCUTANEOUS
  Administered 2023-12-28: 5 [IU] via SUBCUTANEOUS
  Administered 2023-12-28: 9 [IU] via SUBCUTANEOUS
  Administered 2023-12-29 (×3): 5 [IU] via SUBCUTANEOUS

## 2023-12-25 MED ORDER — MORPHINE SULFATE (PF) 4 MG/ML IV SOLN
4.0000 mg | Freq: Once | INTRAVENOUS | Status: AC
  Administered 2023-12-25: 4 mg via INTRAVENOUS
  Filled 2023-12-25: qty 1

## 2023-12-25 MED ORDER — DEXTROSE IN LACTATED RINGERS 5 % IV SOLN: INTRAVENOUS | Status: DC

## 2023-12-25 MED ORDER — INSULIN ASPART 100 UNIT/ML IJ SOLN
0.0000 [IU] | Freq: Every day | INTRAMUSCULAR | Status: DC
  Administered 2023-12-26 – 2023-12-27 (×2): 3 [IU] via SUBCUTANEOUS
  Administered 2023-12-28: 4 [IU] via SUBCUTANEOUS

## 2023-12-25 MED ORDER — LACTATED RINGERS IV SOLN: INTRAVENOUS | Status: DC

## 2023-12-25 MED ORDER — SODIUM CHLORIDE 0.9 % IV SOLN
1.0000 g | INTRAVENOUS | Status: DC
  Administered 2023-12-25 – 2023-12-27 (×3): 1 g via INTRAVENOUS
  Filled 2023-12-25 (×3): qty 10

## 2023-12-25 MED ORDER — ONDANSETRON HCL 4 MG/2ML IJ SOLN
4.0000 mg | Freq: Once | INTRAMUSCULAR | Status: AC
  Administered 2023-12-25: 4 mg via INTRAVENOUS
  Filled 2023-12-25: qty 2

## 2023-12-25 MED ORDER — INSULIN REGULAR(HUMAN) IN NACL 100-0.9 UT/100ML-% IV SOLN
INTRAVENOUS | Status: DC
  Administered 2023-12-25: 9.5 [IU]/h via INTRAVENOUS
  Administered 2023-12-25: 1.7 [IU]/h via INTRAVENOUS
  Filled 2023-12-25: qty 100

## 2023-12-25 MED ORDER — ACETAMINOPHEN 10 MG/ML IV SOLN
1000.0000 mg | Freq: Four times a day (QID) | INTRAVENOUS | Status: AC
  Administered 2023-12-25 – 2023-12-26 (×4): 1000 mg via INTRAVENOUS
  Filled 2023-12-25 (×4): qty 100

## 2023-12-25 MED ORDER — ONDANSETRON HCL 4 MG/2ML IJ SOLN
4.0000 mg | Freq: Four times a day (QID) | INTRAMUSCULAR | Status: DC | PRN
  Filled 2023-12-25: qty 2

## 2023-12-25 MED ORDER — ENOXAPARIN SODIUM 40 MG/0.4ML IJ SOSY
40.0000 mg | PREFILLED_SYRINGE | INTRAMUSCULAR | Status: DC
  Administered 2023-12-25: 40 mg via SUBCUTANEOUS
  Filled 2023-12-25: qty 0.4

## 2023-12-25 MED ORDER — PROCHLORPERAZINE EDISYLATE 10 MG/2ML IJ SOLN
10.0000 mg | Freq: Four times a day (QID) | INTRAMUSCULAR | Status: DC | PRN
  Administered 2023-12-25 (×2): 10 mg via INTRAVENOUS
  Filled 2023-12-25 (×2): qty 2

## 2023-12-25 MED ORDER — DEXTROSE 50 % IV SOLN: 0.0000 mL | INTRAVENOUS | Status: DC | PRN

## 2023-12-25 MED ORDER — LACTATED RINGERS IV SOLN
INTRAVENOUS | Status: DC
Start: 2023-12-25 — End: 2023-12-25

## 2023-12-25 NOTE — Care Plan (Addendum)
 FMTS Brief Progress Note  S: feeling sleepy. Is having some rib pain from throwing up.    O: BP 114/65   Pulse 91   Temp 98.8 F (37.1 C) (Oral)   Resp 11   Ht 5' (1.524 m)   Wt 48.1 kg   SpO2 100%   BMI 20.70 kg/m   General: asleep, arousal to voice, NAD  HEENT: No sign of trauma, EOM grossly intact Cardiac: RRR, no m/r/g Respiratory: CTAB, normal WOB GI: Soft, nondistended  MSK: mildly TTP along sternum  Psych: Appropriate mood and affect   A/P: 43 yo F admitted for HHS and suspected pyelonephritis. Patients CBG now <250. Patient still has poor PO intake with N/V likely in setting of pyelonephritis. Will plan to transition off endotool and continue to monitor CBGs. Given patient still with poor PO, will monitor CBGs for now and consider adding SSI vs scheduling subcutaneous insulin if CBGs start to rise. Will continue IV fluids in setting of pyelonephritis and poor PO.  - discontinue q4h BMPs - continue CBGs q4h - transition from D5LR  to LR @ 125 ml/hr, will consider discontinuing when able to PO better  - Orders reviewed. Labs for AM ordered, which was adjusted as needed.  - Rest of plan per day team   Penne Lash, MD 12/25/2023, 8:00 PM PGY-1, [facility] Night Resident  Please page [phone] with questions.

## 2023-12-25 NOTE — Hospital Course (Addendum)
 Shelby Brown is a 43 y.o.female with a history of DMII, Depression, Normocytic Anemia who was admitted to the [facility] at [facility] for [facility] and Pyelonephritis. Her hospital course is detailed below:  HHS Presented with sugars in the 600s and serum osmolality of 310.  Patient was not taking home metformin 500mg  BID or insulin due to concern that this caused N/V.  Started on endotool and able to be transitioned off the same day (4/8). She was treated with mIVF given dehydration, was tolerating p.o intake well by the time of discharge. Patient was started on insulin regimen while in the hospital and CBGs continue to be well controlled.  Discharge regimen is 10 u LAI daily, metformin XR 500 daily.   Pyelonephritis  Dehydration Patient presented with some lower abdominal pain and was found to have CT consistent with pyelonephritis. She was started on Ceftriaxone 1g q24h. She had some nausea and vomiting with decrease PO, so was continued on mIVF. Her PO intake improved throughout her stay. She was transitioned off IV antibiotics to Cefuroxime to end on 4/17.   Neuropathy/paresthesias Patient reported left-sided tingling, decrease sensation for several months.  This is likely in the setting of diabetic neuropathy given A1c >15.5.  Left vision loss Patient reported left-sided vision loss that started months to years ago.  CT head (obtained due to concern for acute stroke) showed symmetric enhancement of ophthalmic arteries.  Ophthalmology was consulted and recommended outpatient follow up. Scheduled for appointment with Dr Alva Auer of [facility] on 01/03/24 at 8AM.   Other chronic conditions were medically managed with home medications and formulary alternatives as necessary (Anemia, Depression)  PCP Follow-up Recommendations: Dr. Sampson Critchley to be PCP Ensure completion of antibiotics Review diabetes regimen Eval for need for antihypertensive- consider starting  ACEi or ARB Repeat UA for microscopic hematuria outpatient Evaluate for need for treatment for depression Workup possible diabetic neuropathy Ophthalmology for diabetic eye exam, workup L eye vision loss

## 2023-12-25 NOTE — H&P (Addendum)
 Hospital Admission History and Physical Service Pager: [phone]  Patient name: Shelby Brown Medical record number: [id number] Date of Birth: 03-15-1981 Age: 43 y.o. Gender: female  Primary Care Provider: Patient, No Pcp Per Consultants: None Code Status: Full code Preferred Emergency Contact:  Contact Information     Name Relation Home Work Mobile   [name],[name] Spouse [phone]  [phone]   R,[name] Daughter [phone]     Alvan Dame Daughter   [phone]      Other Contacts   None on File      Chief Complaint: abdominal pain and vomiting   Assessment and Plan: Shelby Brown is a 43 y.o. female presenting with intractable nausea and vomiting with left lower abdominal pain.  Most likely differentials given labs and imaging include HHS and pyelonephritis.  Also considered pancreatitis as lipase is elevated though not quite 3 times upper limit of normal and no evidence of pancreatitis on CT imaging.  Also considered SBO though less likely given no abdominal distention and no evidence of SBO on imaging.  Can also consider PID though patient denies any new sexual partners and states she has low concern for this, also denies any increased or odor to discharge. Assessment & Plan Hyperosmolar hyperglycemic state (HHS) (HCC) Meets criteria on arrival with glucose 676, serum Osm 310. BHB slightly elevated to 1.11 but she is not acidotic with only 5 ketones in urine so low concern for DKA.  No AMS at this time.  BP trended down to 80s/50s likely in setting of dehydration and also receiving morphine, 1 L fluid bolus ordered. -Continue insulin gtt. on Endo tool -1 L LR bolus followed by LR 250 ml/hr  - Can transition to D5 LR 150 mL/h when glucose < 250 - BMP, mag and phos q4h while on insulin  - am bmp and cbc  -Transition to long-acting insulin and SSI when glucose consistently <300 being sure to administer LA insuline 2 hours prior to  discontinuing insulin gtt. Pyelonephritis UA negative for UTI though she endorses some dysuria and CT consistent with pyelonephritis which she was diagnosed with during previous ED visit earlier this month. -Ceftriaxone 1 g every 24 hours, transition to oral when able to p.o. Abdominal pain Diffuse abdominal pain worse in left lower quadrant associated with nausea and vomiting.  Likely due to pyelonephritis however cannot definitively rule out pancreatitis at this time given elevated lipase though no evidence of pancreatitis on CT. -Continue IV fluids as above -IV Tylenol 1000 mg every 6 hours scheduled, transition to p.o. when able -IV Dilaudid 0.5 mg every 2 hours as needed - Treat the above and CTM for improvement  Type 2 diabetes mellitus with hyperglycemia (HCC) Home medication includes metformin 500 mg twice daily.  Previously declined insulin during last ED visit. -f/u A1c -Transition to long-acting insulin and SSI when able -Discussed long-term management options with patient prior to discharge Chest pain Reproducible with palpation, likely associated with dry heaves.  EKG with bifid T waves in V4, V5 and V6. Trops pending.  -f/u trops -Repeat EKG - monitor electrolytes -Continuous cardiac monitoring for now -Pain control as above    Chronic and Stable Conditions: Depression, not currently on medications Normocytic anemia, hemoglobin of 11.6 on admission which is near baseline.  Previously worked up and likely IDA with low TIBC though ferritin normal.  Can be addressed out patient  FEN/GI: N.p.o. VTE Prophylaxis: Lovenox  Disposition: Progressive  History of Present Illness:  Shelby Brown is a 43  y.o. female presenting with abdominal pain, nausea and vomiting Patient declines tele interpreter and prefers to use daughter as interpreter Pt states she has had L lower abdominal pain radiating to the back intermittently for the past couple months but severely worsened  last. Prior to the pain, she started vomiting yesterday morning which continued the whole day. Has not been able to eat and drink and keep it down sine the day before. She noted red tinged saliva yesterday morning but no blood in vomit since.  She has one episode of diarrhea this morning.  No fevers but has felt hot. Admits to feeling light headed and a little SOB this morning. No cough or congestion. L sided constant chest pain started this morning, worse with pressing on it.  Admits to dysuria since yesterday Denies increased discharge or odor to discharge.  No new sexual partners and she has low concern for STI. No sick contacts     In the ED, she was briefly tachycardic to 101 which resolved with with BP 150/91 and afebrile.  Labs significant for lipase 142, hyperglycemia to 676, BHB 1.11, VBG with pH 7.387, serum osm 310, Na 129 which corrects to 138, leukocytosis to 20.2, normocytic anemia with hemoglobin 11.5, UA with glucose, 5 ketones, 100 protein. CT abdomen pelvis with left perinephric fat stranding, mild diffuse cortical edema with left hydronephrosis and hydroureter, R hydronephrosis without signs of inflammation.  Bladder with gas in the lumen and anterior bladder wall thickening, heterogeneous and mildly enlarged uterus. He was diagnosed with pyelonephritis and treated with morphine, Zofran and with LR 125 ml/hr and insulin drip for probable HHS.   Review Of Systems: Per HPI   Pertinent Past Medical History: T2DM MDD Remainder reviewed in history tab.   Pertinent Past Surgical History: C section and lubal ligation   Remainder reviewed in history tab.  Pertinent Social History: Tobacco use: No Alcohol use: No Other Substance use: No Lives with daughters and husband   Pertinent Family History: Father - throat cancer  Sisters - diabetes Remainder reviewed in history tab.   Important Outpatient Medications: Metformin  Remainder reviewed in medication history.    Objective: BP 112/78   Pulse 92   Temp 98.4 F (36.9 C) (Oral)   Resp 17   Ht 5' (1.524 m)   Wt 48.1 kg   SpO2 100%   BMI 20.70 kg/m  Exam: General: Ill-appearing 43 year old female, mild distress due to vomiting Eyes: White sclera, clear conjunctiva ENTM: Poor dentition, MMM Neck: Supple Cardiovascular: RRR, normal S1/S2, no murmur Respiratory: CTAB, normal effort Gastrointestinal: Bowel sounds present, soft, nondistended, diffusely tender to palpation worse in left lower quadrant without guarding.  No left CVA tenderness. MSK: Normal bulk and tone. Pain with palpation of L anterior chest wall  Derm: Warm and dry Neuro: Alert, no focal deficits and alert and oriented x 4 Psych: Mood and affect appropriate for situation  Labs:  CBC BMET  Recent Labs  Lab 12/25/23 0332 12/25/23 0622  WBC 20.2*  --   HGB 11.5* 11.6*  HCT 34.3* 34.0*  PLT 339  --    Recent Labs  Lab 12/25/23 0332 12/25/23 0622  NA 129* 130*  K 3.9 4.0  CL 93*  --   CO2 24  --   BUN 19  --   CREATININE 0.96  --   GLUCOSE 676*  --   CALCIUM 9.3  --      EKG: My own interpretation (not copied from electronic read)  Rate of 83, QTc slightly prolonged at 493, bifid T waves in leads V4, V5, V6   Imaging Studies Performed: CT abdomen pelvis as above     Erick Alley, DO 12/25/2023, 8:43 AM PGY-3, [facility]  FPTS Intern pager: [phone], text pages welcome Secure chat group [facility] Teaching Service

## 2023-12-25 NOTE — ED Notes (Signed)
 Hypotension noted since morphine administration.  Fluid bolus being given.

## 2023-12-25 NOTE — Assessment & Plan Note (Addendum)
 Reproducible with palpation, likely associated with dry heaves.  EKG with bifid T waves in V4, V5 and V6. Trops pending.  -f/u trops -Repeat EKG - monitor electrolytes -Continuous cardiac monitoring for now -Pain control as above

## 2023-12-25 NOTE — Assessment & Plan Note (Signed)
 UA negative for UTI though she endorses some dysuria and CT consistent with pyelonephritis which she was diagnosed with during previous ED visit earlier this month. -Ceftriaxone 1 g every 24 hours, transition to oral when able to p.o.

## 2023-12-25 NOTE — Assessment & Plan Note (Signed)
 Diffuse abdominal pain worse in left lower quadrant associated with nausea and vomiting.  Likely due to pyelonephritis however cannot definitively rule out pancreatitis at this time given elevated lipase though no evidence of pancreatitis on CT. -Continue IV fluids as above -IV Tylenol 1000 mg every 6 hours scheduled, transition to p.o. when able -IV Dilaudid 0.5 mg every 2 hours as needed - Treat the above and CTM for improvement

## 2023-12-25 NOTE — Plan of Care (Signed)
 Messaged by RN via secure chat that patient's blood pressures were 70s over 50s.  MD to bedside.  Patient easily arousable but overall ill looking.  Discussed plan with RN for repeat LR bolus.  Continue maintenance fluids as per Endo tool order set.  Suspect patient is volume down in the setting of HHS as well as septic.  Low threshold for further fluid resuscitation.

## 2023-12-25 NOTE — ED Notes (Signed)
 Pt endorses pain and nausea have not improved much since receiving meds about an hr ago.

## 2023-12-25 NOTE — ED Triage Notes (Signed)
 Patient coming to ED for evaluation of abdominal pain.  States pain radiates to back.  Having nausea, vomiting, and chills.  Took Tylenol without relief in pain.

## 2023-12-25 NOTE — ED Notes (Signed)
 Patient to CT.

## 2023-12-25 NOTE — ED Provider Notes (Addendum)
  Physical Exam  BP 95/63   Pulse 87   Temp 98.6 F (37 C) (Oral)   Resp 16   Ht 5' (1.524 m)   Wt 48.1 kg   SpO2 100%   BMI 20.70 kg/m   Physical Exam Vitals and nursing note reviewed.  Constitutional:      Appearance: She is ill-appearing.  HENT:     Head: Normocephalic and atraumatic.  Pulmonary:     Effort: Pulmonary effort is normal.  Abdominal:     General: Abdomen is flat.     Palpations: Abdomen is soft.     Tenderness: There is no abdominal tenderness.  Neurological:     Mental Status: She is alert.     Procedures  Procedures  ED Course / MDM    Medical Decision Making Amount and/or Complexity of Data Reviewed Labs: ordered. Radiology: ordered.  Risk Prescription drug management. Decision regarding hospitalization.   Patient care to see from Dorette Garb.  PA-C at shift change, please see his note for full HPI.  Briefly, patient here with sudden onset of left lower quadrant pain which began around 9 PM.  Previously diagnosed with pyelonephritis in the month of March, was treated with Keflex which later resolved.  Arriving here with a low-grade temp of 99.2, given medication for pain control.  White blood cell count is remarkably elevated at 20.  Plan is for pending CT for further evaluation.  CT Abdomen pelvis shows: IMPRESSION:  1. Asymmetric left-sided perinephric fat stranding and fluid  identified. Mild diffuse cortical edema with left hydronephrosis and  hydroureter is identified to the level of the bladder. Findings are  compatible with acute pyelonephritis.  2. Mild right hydronephrosis without perinephric inflammation or  fluid. No significant right hydroureter. No urinary tract calculi  identified.  3. Anterior bladder wall thickening. Gas within the bladder lumen  noted. Findings are compatible with cystitis.  4. Heterogeneous appearance of the uterus which appears mildly  enlarged and may reflect underlying fibroids.  5. Trace free fluid  within the dependent portion of the pelvis.   8:34 AM patient reevaluated by me, continues to have nausea and vomiting episodes.  Given a second round of morphine and Zofran and still does not have improvement, her blood sugar is trending down slowly.  I do feel that because of patient's uncontrolled diabetes, and new infection pyelonephritis she meets criteria for admission at this time.  Will place call for hospitalist admission.  8:49 AM Spoke to family medicine team who will admit patient for further management.   Portions of this note were generated with Scientist, clinical (histocompatibility and immunogenetics). Dictation errors may occur despite best attempts at proofreading.            Gregoire Bennis, PA-C 12/25/23 0849    Marigrace Mccole, PA-C 12/25/23 1522    Kelsey Patricia, MD 12/31/23 [phone]

## 2023-12-25 NOTE — Assessment & Plan Note (Signed)
 Meets criteria on arrival with glucose 676, serum Osm 310. BHB slightly elevated to 1.11 but she is not acidotic with only 5 ketones in urine so low concern for DKA.  No AMS at this time.  BP trended down to 80s/50s likely in setting of dehydration and also receiving morphine, 1 L fluid bolus ordered. -Continue insulin gtt. on Endo tool -1 L LR bolus followed by LR 250 ml/hr  - Can transition to D5 LR 150 mL/h when glucose < 250 - BMP, mag and phos q4h while on insulin  - am bmp and cbc  -Transition to long-acting insulin and SSI when glucose consistently <300 being sure to administer LA insuline 2 hours prior to discontinuing insulin gtt.

## 2023-12-25 NOTE — Assessment & Plan Note (Signed)
 Home medication includes metformin 500 mg twice daily.  Previously declined insulin during last ED visit. -f/u A1c -Transition to long-acting insulin and SSI when able -Discussed long-term management options with patient prior to discharge

## 2023-12-25 NOTE — ED Provider Notes (Signed)
 [place] EMERGENCY DEPARTMENT AT [facility] Provider Note   CSN: [id number] Arrival date & time: 12/25/23  0246     History  Chief Complaint  Patient presents with   Abdominal Pain    Shelby Brown is a 43 y.o. female.  Patient with past medical history significant for uncontrolled type II DM, intentional metformin overdose, major depressive disorder, chronic epigastric abdominal pain, pyelonephritis of left kidney presents to the emergency department complaining of left-sided abdominal pain with associated nausea and vomiting which began at approximately 9 PM Monday evening.  Patient reports that pain radiates to her back.  Patient also endorses chills.  She denies diarrhea, chest pain, shortness of breath, known fevers, urinary symptoms.  Of note patient was seen in the emergency department on March 2 of this year and diagnosed with pyelonephritis and treated with Keflex.  Her urine culture grew E. coli  Patient offered interpreter service but declined, requesting to use her family at bedside.  Abdominal Pain      Home Medications Prior to Admission medications   Medication Sig Start Date End Date Taking? Authorizing Provider  blood glucose meter kit and supplies KIT Dispense based on patient and insurance preference. Use up to four times daily as directed. (FOR ICD-9 250.00, 250.01). 09/13/20   Azucena Fallen, MD  famotidine (PEPCID) 20 MG tablet Take 1 tablet (20 mg total) by mouth 2 (two) times daily. 03/29/21 03/29/22  Dorcas Carrow, MD  insulin isophane & regular human (NOVOLIN 70/30 FLEXPEN RELION) (70-30) 100 UNIT/ML KwikPen Inject 10 Units into the skin 2 (two) times daily. 03/29/21   Dorcas Carrow, MD  Insulin Syringes, Disposable, U-100 0.5 ML MISC 1 Package by Does not apply route in the morning and at bedtime. 09/13/20   Azucena Fallen, MD  metFORMIN (GLUCOPHAGE) 1000 MG tablet Take 0.5 tablets (500 mg total) by mouth 2 (two) times daily  with a meal. 11/18/23   Rondel Baton, MD  metoCLOPramide (REGLAN) 10 MG tablet Take 1 tablet (10 mg total) by mouth every 6 (six) hours as needed for up to 7 days for nausea. 03/29/21 04/05/21  Dorcas Carrow, MD  ondansetron (ZOFRAN-ODT) 4 MG disintegrating tablet Take 1 tablet (4 mg total) by mouth every 8 (eight) hours as needed for nausea or vomiting. 11/18/23   Rondel Baton, MD  potassium chloride SA (KLOR-CON) 20 MEQ tablet Take 1 tablet (20 mEq total) by mouth daily for 7 days. 03/29/21 04/05/21  Dorcas Carrow, MD  promethazine (PHENERGAN) 6.25 MG/5ML syrup Take 5 mLs (6.25 mg total) by mouth 4 (four) times daily as needed for nausea or vomiting. Patient not taking: Reported on 11/16/2020 09/13/20 11/19/20  Azucena Fallen, MD      Allergies    Patient has no known allergies.    Review of Systems   Review of Systems  Gastrointestinal:  Positive for abdominal pain.    Physical Exam Updated Vital Signs BP (!) 145/82   Pulse 95   Temp 99.2 F (37.3 C)   Resp 18   Ht 5' (1.524 m)   Wt 48.1 kg   SpO2 100%   BMI 20.70 kg/m  Physical Exam Vitals and nursing note reviewed.  Constitutional:      General: She is not in acute distress.    Appearance: She is well-developed.  HENT:     Head: Normocephalic and atraumatic.  Eyes:     Conjunctiva/sclera: Conjunctivae normal.  Cardiovascular:     Rate and  Rhythm: Normal rate and regular rhythm.  Pulmonary:     Effort: Pulmonary effort is normal. No respiratory distress.     Breath sounds: Normal breath sounds.  Abdominal:     Palpations: Abdomen is soft.     Tenderness: There is abdominal tenderness in the right lower quadrant, suprapubic area and left lower quadrant. There is no right CVA tenderness or left CVA tenderness.  Musculoskeletal:        General: No swelling.     Cervical back: Neck supple.  Skin:    General: Skin is warm and dry.     Capillary Refill: Capillary refill takes less than 2 seconds.   Neurological:     Mental Status: She is alert.  Psychiatric:        Mood and Affect: Mood normal.     ED Results / Procedures / Treatments   Labs (all labs ordered are listed, but only abnormal results are displayed) Labs Reviewed  LIPASE, BLOOD - Abnormal; Notable for the following components:      Result Value   Lipase 142 (*)    All other components within normal limits  COMPREHENSIVE METABOLIC PANEL WITH GFR - Abnormal; Notable for the following components:   Sodium 129 (*)    Chloride 93 (*)    Glucose, Bld 676 (*)    Albumin 3.3 (*)    All other components within normal limits  CBC - Abnormal; Notable for the following components:   WBC 20.2 (*)    Hemoglobin 11.5 (*)    HCT 34.3 (*)    All other components within normal limits  URINALYSIS, ROUTINE W REFLEX MICROSCOPIC - Abnormal; Notable for the following components:   Color, Urine STRAW (*)    Glucose, UA >=500 (*)    Hgb urine dipstick SMALL (*)    Ketones, ur 5 (*)    Protein, ur 100 (*)    Bacteria, UA RARE (*)    All other components within normal limits  CBG MONITORING, ED - Abnormal; Notable for the following components:   Glucose-Capillary 534 (*)    All other components within normal limits  HCG, SERUM, QUALITATIVE  BETA-HYDROXYBUTYRIC ACID  OSMOLALITY  I-STAT VENOUS BLOOD GAS, ED    EKG None  Radiology No results found.  Procedures Procedures    Medications Ordered in ED Medications  insulin regular, human (MYXREDLIN) 100 units/ 100 mL infusion (9.5 Units/hr Intravenous New Bag/Given 12/25/23 0557)  lactated ringers infusion ( Intravenous New Bag/Given 12/25/23 0539)  dextrose 5 % in lactated ringers infusion (has no administration in time range)  dextrose 50 % solution 0-50 mL (has no administration in time range)  ondansetron (ZOFRAN-ODT) disintegrating tablet 4 mg (4 mg Oral Given 12/25/23 0342)  morphine (PF) 4 MG/ML injection 4 mg (4 mg Intravenous Given 12/25/23 0539)  iohexol (OMNIPAQUE)  350 MG/ML injection 75 mL (75 mLs Intravenous Contrast Given 12/25/23 0556)    ED Course/ Medical Decision Making/ A&P                                 Medical Decision Making Amount and/or Complexity of Data Reviewed Labs: ordered. Radiology: ordered.  Risk Prescription drug management.   This patient presents to the ED for concern of abdominal pain, this involves an extensive number of treatment options, and is a complaint that carries with it a high risk of complications and morbidity.  The differential diagnosis includes pyelonephritis,  nephrolithiasis, hydronephrosis, diverticulitis, appendicitis, colitis, gastroparesis, others   Co morbidities that complicate the patient evaluation  History of pyelonephritis, DKA, uncontrolled type II DM   Additional history obtained:  Additional history obtained from family at bedside External records from outside source obtained and reviewed including urine culture results from early March showing E. coli   Lab Tests:  I Ordered, and personally interpreted labs.  The pertinent results include: Glucose 676, leukocytosis with a white count of 20,200, lipase 142, negative pregnancy test, UA with small hemoglobin, ketones, protein, rare bacteria; beta hydroxybutyric acid at 1.11, osmolality 310, normal pH, normal bicarb, anion gap of 12   Imaging Studies ordered:  I ordered imaging studies including CT abdomen pelvis with contrast Imaging pending   Problem List / ED Course / Critical interventions / Medication management   I ordered medication including morphine for pain, Zofran for nausea, insulin and LR infusion for hyperglycemia Reevaluation of the patient after these medicines showed that the patient improved I have reviewed the patients home medicines and have made adjustments as needed   Social Determinants of Health:  Patient is self-pay   Test / Admission - Considered:  Patient with lower abdominal pain with associated  nausea and vomiting.  She denies dysuria, vaginal discharge.  Low suspicion at this time of PID based on presentation.  Plan to sign patient out to oncoming provider at shift handoff.  Disposition pending response to insulin/lactated Ringer's and results of CT abdomen pelvis.         Final Clinical Impression(s) / ED Diagnoses Final diagnoses:  None    Rx / DC Orders ED Discharge Orders     None         Pamala Duffel 12/25/23 0640    Tilden Fossa, MD 12/25/23 [phone]

## 2023-12-26 DIAGNOSIS — Z789 Other specified health status: Secondary | ICD-10-CM

## 2023-12-26 LAB — CBC
HCT: 28.7 % — ABNORMAL LOW (ref 36.0–46.0)
Hemoglobin: 9.7 g/dL — ABNORMAL LOW (ref 12.0–15.0)
MCH: 28.9 pg (ref 26.0–34.0)
MCHC: 33.8 g/dL (ref 30.0–36.0)
MCV: 85.4 fL (ref 80.0–100.0)
Platelets: 279 10*3/uL (ref 150–400)
RBC: 3.36 MIL/uL — ABNORMAL LOW (ref 3.87–5.11)
RDW: 12.2 % (ref 11.5–15.5)
WBC: 18.3 10*3/uL — ABNORMAL HIGH (ref 4.0–10.5)
nRBC: 0 % (ref 0.0–0.2)

## 2023-12-26 LAB — GLUCOSE, CAPILLARY
Glucose-Capillary: 211 mg/dL — ABNORMAL HIGH (ref 70–99)
Glucose-Capillary: 281 mg/dL — ABNORMAL HIGH (ref 70–99)

## 2023-12-26 LAB — CBG MONITORING, ED
Glucose-Capillary: 192 mg/dL — ABNORMAL HIGH (ref 70–99)
Glucose-Capillary: 201 mg/dL — ABNORMAL HIGH (ref 70–99)
Glucose-Capillary: 229 mg/dL — ABNORMAL HIGH (ref 70–99)
Glucose-Capillary: 266 mg/dL — ABNORMAL HIGH (ref 70–99)

## 2023-12-26 LAB — BASIC METABOLIC PANEL WITH GFR
Anion gap: 9 (ref 5–15)
BUN: 21 mg/dL — ABNORMAL HIGH (ref 6–20)
CO2: 24 mmol/L (ref 22–32)
Calcium: 8.3 mg/dL — ABNORMAL LOW (ref 8.9–10.3)
Chloride: 102 mmol/L (ref 98–111)
Creatinine, Ser: 1.13 mg/dL — ABNORMAL HIGH (ref 0.44–1.00)
GFR, Estimated: >60 mL/min (ref >60)
Glucose, Bld: 188 mg/dL — ABNORMAL HIGH (ref 70–99)
Potassium: 4 mmol/L (ref 3.5–5.1)
Sodium: 135 mmol/L (ref 135–145)

## 2023-12-26 LAB — MAGNESIUM: Magnesium: 1.7 mg/dL (ref 1.7–2.4)

## 2023-12-26 LAB — PHOSPHORUS: Phosphorus: 4.1 mg/dL (ref 2.5–4.6)

## 2023-12-26 MED ORDER — LACTATED RINGERS IV SOLN: INTRAVENOUS | Status: AC

## 2023-12-26 MED ORDER — ORAL CARE MOUTH RINSE: 15.0000 mL | OROMUCOSAL | Status: DC | PRN

## 2023-12-26 MED ORDER — ENOXAPARIN SODIUM 30 MG/0.3ML IJ SOSY
30.0000 mg | PREFILLED_SYRINGE | INTRAMUSCULAR | Status: DC
  Administered 2023-12-26 – 2023-12-29 (×4): 30 mg via SUBCUTANEOUS
  Filled 2023-12-26 (×4): qty 0.3

## 2023-12-26 MED ORDER — INSULIN GLARGINE-YFGN 100 UNIT/ML ~~LOC~~ SOLN: 5.0000 [IU] | Freq: Every day | SUBCUTANEOUS | Status: DC

## 2023-12-26 MED ORDER — ACETAMINOPHEN 325 MG PO TABS: 650.0000 mg | ORAL_TABLET | Freq: Four times a day (QID) | ORAL | Status: DC | PRN

## 2023-12-26 NOTE — ED Notes (Signed)
 Husband called to notify of room change

## 2023-12-26 NOTE — Assessment & Plan Note (Deleted)
 Diffuse abdominal pain worse in left lower quadrant associated with nausea and vomiting.  Likely due to pyelonephritis however cannot definitively rule out pancreatitis at this time given elevated lipase though no evidence of pancreatitis on CT. -Continue IV fluids as above -IV Tylenol 1000 mg every 6 hours scheduled, transition to p.o. when able -IV Dilaudid 0.5 mg every 2 hours as needed - Treat the above and CTM for improvement

## 2023-12-26 NOTE — Assessment & Plan Note (Deleted)
 Home medication includes metformin 500 mg twice daily.  Previously declined insulin during last ED visit. -f/u A1c -Transition to long-acting insulin and SSI when able -Discussed long-term management options with patient prior to discharge

## 2023-12-26 NOTE — Assessment & Plan Note (Addendum)
 Transitioned off endotool. On sSSI and correction coverage, no LAI yet. Home regimen metformin 500 mg twice daily, no insulin. BG <200 overnight. Has gotten 1L LR bolus x2. Mentating well this AM, has not eaten yet but will try today.  Pressures remain soft but within normal limits.  Electrolytes WNL. -- Continue sensitive sliding scale with meals, correction scale -- Will add on long-acting insulin if indicated based on CBGs --continue IVF today, can consider discontinuing tomorrow if p.o. intake is good --Follow-up A1c - AM BMP, mag

## 2023-12-26 NOTE — Assessment & Plan Note (Addendum)
 CT consistent with pyelo. On IV ceftriaxone, was treated with keflex after presentation last month for similar presentation.  White count remains elevated but downtrending, 18.3 this a.m (though may be dilutional as hemoglobin also decreased). --continue CTX for now, will consider transition to PO ABX in the next day or so -- AM CBC --Tylenol 1000 mg Q6 as needed

## 2023-12-26 NOTE — Inpatient Diabetes Management (Signed)
 Inpatient Diabetes Program Recommendations  AACE/ADA: New Consensus Statement on Inpatient Glycemic Control (2015)  Target Ranges:  Prepandial:   less than 140 mg/dL      Peak postprandial:   less than 180 mg/dL (1-2 hours)      Critically ill patients:  140 - 180 mg/dL   Lab Results  Component Value Date   GLUCAP 201 (H) 12/26/2023   HGBA1C 12.8 (H) 03/26/2021    Review of Glycemic Control  Diabetes history: DM 2 Outpatient Diabetes medications: Metformin  Current orders for Inpatient glycemic control:  Novolog 0-9 units tid + hs  A1c 12.8% on 03/26/2021  -   may consider glipizide 5 mg bid and try to avoid insulin to see what trends do   Spoke with pt and husband at bedside regarding Glucose levels and medications at home for glucose control. Pt reports she was taking only metformin for her glucose and that when she was recently placed on antibiotics for a UTI, she stopped taking the metformin thinking she could not take them together. In the past when pt was prescribed insulin during pregnancy and again in recent years during the last time she was in the hospital pts reports having N/V side effects to the insulin, pt reports the symptoms were persistent. She could not name the specific insulin she was on, she also could not verbalize the levels her glucose trends were at at those times either. Pt and husband requesting to be placed on another oral agent at time of discharge. She has a glucometer and is able to get supplies to her meter. I encouraged pt to check glucose at least 2 times a day (first thing in the am and a second check later in the day). I reviewed for pt to stay around a 150 glucose average everyday. Pt reports not having a PCP due to the cost of visits.I discussed portion sizes ad modifications with diet. Encouraged exercise at home.  Thanks,  Christena Deem RN, MSN, BC-ADM Inpatient Diabetes Coordinator Team Pager [phone] (8a-5p)

## 2023-12-26 NOTE — Plan of Care (Signed)
  Problem: Coping: Goal: Ability to adjust to condition or change in health will improve Outcome: Progressing   Problem: Skin Integrity: Goal: Risk for impaired skin integrity will decrease Outcome: Progressing   Problem: Education: Goal: Knowledge of General Education information will improve Description: Including pain rating scale, medication(s)/side effects and non-pharmacologic comfort measures Outcome: Progressing   Problem: Activity: Goal: Risk for activity intolerance will decrease Outcome: Progressing   Problem: Pain Managment: Goal: General experience of comfort will improve and/or be controlled Outcome: Progressing   Problem: Skin Integrity: Goal: Risk for impaired skin integrity will decrease Outcome: Progressing

## 2023-12-26 NOTE — Assessment & Plan Note (Addendum)
 Reproducible on chest wall palpation.  Repeat EKG without abnormalities, trops WNL  -- Continue cardiac monitoring given severity of presentation -- Monitor electrolytes as above -- pain control as above

## 2023-12-26 NOTE — Progress Notes (Signed)
 Daily Progress Note Intern Pager: [phone]  Patient name: Shelby Brown Medical record number: [id number] Date of birth: December 01, 1980 Age: 43 y.o. Gender: female  Primary Care Provider: Patient, No Pcp Per Consultants: none Code Status: full  Pt Overview and Major Events to Date:  4/8: admitted to [facility]MTS  Assessment and Plan: Shelby Brown is a 43 year old female who presents with nausea and vomiting and left lower abdominal pain and to have HHS and pyelonephritis.  Feeling better overall today, will try to eat.  Currently being treated with IV antibiotics for pyelo.  Transitioned off Endo tool to subcutaneous insulin, hydration status has improved after receiving IV fluids.  Pertinent PMH/PSH includes type 2 diabetes, depression.  Assessment & Plan Hyperosmolar hyperglycemic state (HHS) (HCC) Transitioned off endotool. On sSSI and correction coverage, no LAI yet. Home regimen metformin 500 mg twice daily, no insulin. BG <200 overnight. Has gotten 1L LR bolus x2. Mentating well this AM, has not eaten yet but will try today.  Pressures remain soft but within normal limits.  Electrolytes WNL. -- Continue sensitive sliding scale with meals, correction scale -- Will add on long-acting insulin if indicated based on CBGs --continue IVF today, can consider discontinuing tomorrow if p.o. intake is good --Follow-up A1c - AM BMP, mag Pyelonephritis CT consistent with pyelo. On IV ceftriaxone, was treated with keflex after presentation last month for similar presentation.  White count remains elevated but downtrending, 18.3 this a.m (though may be dilutional as hemoglobin also decreased). --continue CTX for now, will consider transition to PO ABX in the next day or so -- AM CBC --Tylenol 1000 mg Q6 as needed Chest pain Reproducible on chest wall palpation.  Repeat EKG without abnormalities, trops WNL  -- Continue cardiac monitoring given severity of presentation -- Monitor  electrolytes as above -- pain control as above Chronic health problem Depression: Not on meds Normocytic anemia: Monitoring hemoglobin with CBC as above   FEN/GI: Regular PPx: Lovenox Dispo: Pending clinical improvement  Subjective:  Shelby Brown states she feels well this morning, denies ongoing abdominal pain.  Has not eaten yet but is planning to today.  Objective: Temp:  [98.4 F (36.9 C)-98.8 F (37.1 C)] 98.4 F (36.9 C) (04/09 0400) Pulse Rate:  [83-97] 86 (04/09 0710) Resp:  [0-23] 15 (04/09 0710) BP: (77-141)/(49-78) 117/68 (04/09 0710) SpO2:  [95 %-100 %] 99 % (04/09 0710) Physical Exam: General: Resting comfortably in bed, pleasant conversant, in no acute distress Cardiovascular: RRR, normal S1/S2, no M/R/G Respiratory: CTAB, normal WOB on RA, no W/R/R Abdomen: Normoactive bowel sounds, soft, mildly tender to palpation in upper quadrants, nondistended Extremities: No edema to BLE  Laboratory: Most recent CBC Lab Results  Component Value Date   WBC 18.3 (H) 12/26/2023   HGB 9.7 (L) 12/26/2023   HCT 28.7 (L) 12/26/2023   MCV 85.4 12/26/2023   PLT 279 12/26/2023   Most recent BMP    Latest Ref Rng & Units 12/26/2023    5:38 AM  BMP  Glucose 70 - 99 mg/dL 147   BUN 6 - 20 mg/dL 21   Creatinine 8.29 - 1.00 mg/dL 5.62   Sodium 130 - 865 mmol/L 135   Potassium 3.5 - 5.1 mmol/L 4.0   Chloride 98 - 111 mmol/L 102   CO2 22 - 32 mmol/L 24   Calcium 8.9 - 10.3 mg/dL 8.3     Other pertinent labs: pending A1c  Imaging/Diagnostic Tests:  CTAP 1. Asymmetric left-sided perinephric fat  stranding and fluid identified. Mild diffuse cortical edema with left hydronephrosis and hydroureter is identified to the level of the bladder. Findings are compatible with acute pyelonephritis. 2. Mild right hydronephrosis without perinephric inflammation or fluid. No significant right hydroureter. No urinary tract calculi identified. 3. Anterior bladder wall thickening.  Gas within the bladder lumen noted. Findings are compatible with cystitis. 4. Heterogeneous appearance of the uterus which appears mildly enlarged and may reflect underlying fibroids. 5. Trace free fluid within the dependent portion of the pelvis.   Lorayne Bender, MD 12/26/2023, 7:15 AM  PGY-1, [facility] FPTS Intern pager: [phone], text pages welcome Secure chat group [facility] Teaching Service

## 2023-12-26 NOTE — Assessment & Plan Note (Addendum)
 Depression: Not on meds Normocytic anemia: Monitoring hemoglobin with CBC as above

## 2023-12-27 ENCOUNTER — Inpatient Hospital Stay (HOSPITAL_COMMUNITY): Payer: Self-pay

## 2023-12-27 DIAGNOSIS — R519 Headache, unspecified: Secondary | ICD-10-CM

## 2023-12-27 DIAGNOSIS — N12 Tubulo-interstitial nephritis, not specified as acute or chronic: Secondary | ICD-10-CM

## 2023-12-27 DIAGNOSIS — H5462 Unqualified visual loss, left eye, normal vision right eye: Secondary | ICD-10-CM

## 2023-12-27 DIAGNOSIS — H53462 Homonymous bilateral field defects, left side: Secondary | ICD-10-CM | POA: Insufficient documentation

## 2023-12-27 DIAGNOSIS — I7389 Other specified peripheral vascular diseases: Secondary | ICD-10-CM

## 2023-12-27 DIAGNOSIS — R200 Anesthesia of skin: Secondary | ICD-10-CM

## 2023-12-27 DIAGNOSIS — H547 Unspecified visual loss: Secondary | ICD-10-CM

## 2023-12-27 HISTORY — DX: Headache, unspecified: R51.9

## 2023-12-27 LAB — CBC
HCT: 27 % — ABNORMAL LOW (ref 36.0–46.0)
Hemoglobin: 8.9 g/dL — ABNORMAL LOW (ref 12.0–15.0)
MCH: 28.6 pg (ref 26.0–34.0)
MCHC: 33 g/dL (ref 30.0–36.0)
MCV: 86.8 fL (ref 80.0–100.0)
Platelets: 250 10*3/uL (ref 150–400)
RBC: 3.11 MIL/uL — ABNORMAL LOW (ref 3.87–5.11)
RDW: 12.3 % (ref 11.5–15.5)
WBC: 10.6 10*3/uL — ABNORMAL HIGH (ref 4.0–10.5)
nRBC: 0 % (ref 0.0–0.2)

## 2023-12-27 LAB — BASIC METABOLIC PANEL WITH GFR
Anion gap: 10 (ref 5–15)
BUN: 13 mg/dL (ref 6–20)
CO2: 22 mmol/L (ref 22–32)
Calcium: 8.1 mg/dL — ABNORMAL LOW (ref 8.9–10.3)
Chloride: 100 mmol/L (ref 98–111)
Creatinine, Ser: 0.71 mg/dL (ref 0.44–1.00)
GFR, Estimated: >60 mL/min (ref >60)
Glucose, Bld: 217 mg/dL — ABNORMAL HIGH (ref 70–99)
Potassium: 4 mmol/L (ref 3.5–5.1)
Sodium: 132 mmol/L — ABNORMAL LOW (ref 135–145)

## 2023-12-27 LAB — GLUCOSE, CAPILLARY
Glucose-Capillary: 238 mg/dL — ABNORMAL HIGH (ref 70–99)
Glucose-Capillary: 189 mg/dL — ABNORMAL HIGH (ref 70–99)
Glucose-Capillary: 192 mg/dL — ABNORMAL HIGH (ref 70–99)
Glucose-Capillary: 180 mg/dL — ABNORMAL HIGH (ref 70–99)
Glucose-Capillary: 258 mg/dL — ABNORMAL HIGH (ref 70–99)

## 2023-12-27 LAB — HEMOGLOBIN A1C
Hgb A1c MFr Bld: >15.5 % — ABNORMAL HIGH (ref 4.8–5.6)
Mean Plasma Glucose: >398 mg/dL

## 2023-12-27 LAB — TROPONIN I (HIGH SENSITIVITY): Troponin I (High Sensitivity): 4 ng/L (ref <18)

## 2023-12-27 LAB — MAGNESIUM: Magnesium: 1.7 mg/dL (ref 1.7–2.4)

## 2023-12-27 MED ORDER — CEFUROXIME AXETIL 250 MG PO TABS
500.0000 mg | ORAL_TABLET | Freq: Two times a day (BID) | ORAL | Status: DC
  Administered 2023-12-28 – 2023-12-29 (×4): 500 mg via ORAL
  Filled 2023-12-27 (×6): qty 2

## 2023-12-27 MED ORDER — IOHEXOL 350 MG/ML SOLN
75.0000 mL | Freq: Once | INTRAVENOUS | Status: AC | PRN
  Administered 2023-12-27: 75 mL via INTRAVENOUS

## 2023-12-27 MED ORDER — FAMOTIDINE 20 MG PO TABS
20.0000 mg | ORAL_TABLET | Freq: Two times a day (BID) | ORAL | Status: DC
  Administered 2023-12-27 – 2023-12-29 (×5): 20 mg via ORAL
  Filled 2023-12-27 (×5): qty 1

## 2023-12-27 NOTE — Code Documentation (Signed)
 Stroke Response Nurse Documentation Code Documentation  Shelby Brown is a 43 y.o. female admitted to [facility]  on 12/25/2023 for N/V and abd pain due to HHS with past medical hx of DM. On No antithrombotic. Code stroke was activated by 6E RN/MD.   Patient on 6E unit where she was LKW at Tuesday 12/25/2023 and now complaining of left vision problems and numbness of the left body. Patient has had numbness since Tuesday but became worse today with vision problems.  Stroke team at the bedside after patient activation. Patient to CT with team. NIHSS 2, see documentation for details and code stroke times. Patient with left hemianopia and left decreased sensation on exam. The following imaging was completed:  CT Head and CTA. Patient is not a candidate for IV Thrombolytic due to outside of window per MD. Patient is not a candidate for IR due to no LVO noted on imaging per MD.   Care/Plan: VS/NIHSS q2hrs x 12hrs, then q4; BP Goal <220/120.   Process Delays Noted: patient is spanish speaking only, delayed with interpreter   Bedside handoff with RN Patience.    Felecia Jan  Stroke Response RN

## 2023-12-27 NOTE — Assessment & Plan Note (Addendum)
 On exam at approximately 8:45 AM, pt reported a headache that started at 7am today with complete vision loss in the L eye and left orbital pain. Pt also reported tingling and decreased sensation on the entire L side of the body. Neuro exam showed decreased visual acuity in L eye. Code stroke called, CT head/CT angio head/neck negative for emergent finding/explanation of symptoms.  Did have symmetric enhancement and ophthalmic arteries.  Prior to my exam, pt had reported to [facility] attending Dr Lum Babe these symptoms have been present for several months. --CTM while inpatient --PT eval and treat --call ophthalmology to review CT angio findings, determine need for inpatient vs outpatient follow up  -- Will begin workup for neuropathy/paresthesias with labs in AM - TSH, B12, thiamine, folate, HIV, RPR

## 2023-12-27 NOTE — Plan of Care (Signed)
 Spoke with Dr. Jeannett Senior [facility] regarding patient's chronic left eye vision loss.  Suspect that her vision loss is likely secondary to possible osmotic swelling of the lens from hyperglycemia.  It is also possible that she has vitreous hemorrhage in the setting of chronically uncontrolled diabetic retinopathy.  He recommended no immediate interventions.  Patient is now scheduled for appointment at 8 AM on Thursday, 17 April.

## 2023-12-27 NOTE — TOC CM/SW Note (Signed)
 Transition of Care [facility]) - Inpatient Brief Assessment   Patient Details  Name: Shelby Brown MRN: [id number] Date of Birth: 1980/12/14  Transition of Care [facility]) CM/SW Contact:    Gala Lewandowsky, RN Phone Number: 12/27/2023, 4:16 PM   Clinical Narrative:  Patient presented for abdominal pain and vomiting. PTA patient was from home with spouse and additional relatives. Discussed that patient does not have PCP- agreeable for Case Manager to schedule PCP appointment -Information is on the AVS. Patient reports that she has transportation to appointments and to home. Case Manager will follow for MATCH-medication assistance once stable- spouse works; however, patient does not. Case Manager will continue to follow for additional needs as the patient progresses.    Transition of Care Asessment: Insurance and Status:  (No insurance) Patient has primary care physician:  (PCP appointment arranged) Home environment has been reviewed: reviewed Prior level of function:: independent Prior/Current Home Services: No current home services Social Drivers of Health Review: SDOH reviewed no interventions necessary Readmission risk has been reviewed: Yes Transition of care needs: no transition of care needs at this time

## 2023-12-27 NOTE — Assessment & Plan Note (Addendum)
 On IV ceftriaxone.  Was treated with Keflex for pyelo in March.  Leukocytosis appropriately downtrending, 10.6 this a.m. Blood cultures with NG x 48hr. --Will transition to cefuroxime 500mg  BID today (7-10 day total course) -- AM CBC

## 2023-12-27 NOTE — Assessment & Plan Note (Addendum)
 Pt reported chest pain with deep inspiration this morning. Also endorses L arm tingling and shoulder pain with decreased strength on exam (limited by pain).  Reassuringly, EKG and troponins did not show evidence of ACS.  Pain is likely musculoskeletal secondary to forceful vomiting. --Continue to monitor

## 2023-12-27 NOTE — Assessment & Plan Note (Signed)
 Depression: Not on meds, will add to PCP follow-up Normocytic anemia: Hemoglobin stable

## 2023-12-27 NOTE — Progress Notes (Signed)
 Daily Progress Note Intern Pager: [phone]  Patient name: Shelby Brown Medical record number: [id number] Date of birth: 30-Mar-1981 Age: 43 y.o. Gender: female  Primary Care Provider: Patient, No Pcp Per Consultants: none Code Status: full  Pt Overview and Major Events to Date:  4/8: Admitted to FMTS  Assessment and Plan: Shelby Brown is a 43 year old female who presented with nausea, vomiting, and abdominal pain found to have HHS and pyelonephritis.  Did well yesterday with good p.o. intake, blood glucose in the 200s, and IV antibiotics.  Will transition to oral antibiotics, discontinue IV fluids, and optimize diabetes regimen today.  Pertinent PMH/PSH includes DM 2, depression, normocytic anemia.  Assessment & Plan Headache with neurologic deficit On exam at approximately 8:45 AM, pt reported a headache that started at 7am today with complete vision loss in the L eye and left orbital pain. Pt also reported tingling and decreased sensation on the entire L side of the body. Neuro exam showed decreased visual acuity in L eye. Code stroke called, CT head/CT angio head/neck negative for emergent finding/explanation of symptoms.  Did have symmetric enhancement and ophthalmic arteries.  Prior to my exam, pt had reported to [facility] attending Dr Lum Babe these symptoms have been present for several months. --CTM while inpatient --PT eval and treat --call ophthalmology to review CT angio findings, determine need for inpatient vs outpatient follow up  -- Will begin workup for neuropathy/paresthesias with labs in AM - TSH, B12, thiamine, folate, HIV, RPR Hyperosmolar hyperglycemic state (HHS) (HCC) Blood glucose in the high 100s/mid 200s over the past day.  Received 14 units of sliding scale yesterday. Home regimen is metformin 500 mg twice a day, does not use insulin.  Per discussion with diabetes educator, patient self discontinued metformin due to fear that it was interacting with  the Keflex she was prescribed for her pyelo.  Spoke at length with patient and family, who agreed to use insulin.  They would also like to follow-up in [facility] clinic for PCP care. Got 2 L LR bolus and has been receiving LR infusion since admission.  Starting p.o. intake today, reports history of acid reflux, does not take meds for this.  Blood pressures WNL.  Sodium corrects to 134, electrolytes WNL otherwise.  Ha1c >15.5, more than likely this explains chronic vision/neurologic symptoms.  -- Continue sensitive SSI, correction coverage --Pepcid 20 mg 2 times a day - Discontinue IV fluids -- Encourage good p.o. intake -- Will schedule hospital follow-up at [facility], appointment with pharmacist Dr. Raymondo Band  Chest pain Pt reported chest pain with deep inspiration this morning. Also endorses L arm tingling and shoulder pain with decreased strength on exam (limited by pain).  Reassuringly, EKG and troponins did not show evidence of ACS.  Pain is likely musculoskeletal secondary to forceful vomiting. --Continue to monitor Pyelonephritis On IV ceftriaxone.  Was treated with Keflex for pyelo in March.  Leukocytosis appropriately downtrending, 10.6 this a.m. Blood cultures with NG x 48hr. --Will transition to cefuroxime 500mg  BID today (7-10 day total course) -- AM CBC  Chronic health problem Depression: Not on meds, will add to PCP follow-up Normocytic anemia: Hemoglobin stable  FEN/GI: Regular PPx: Lovenox Dispo: Pending clinical stability  Subjective:  See above for headache/neuro deficits symptoms that were noted at approximately 8:45 AM.  Saw patient again at 2:30 PM, spoke at length about necessity of using insulin to treat very poorly controlled diabetes.  Patient family agreed to this.  Patient also expressed desire  to follow-up at [facility] clinic for PCP care.  Patient also states she has been having diarrhea, daughter states that this is something that happens at home regularly.  Patient also reports  reflux symptoms.  Has not eaten much yet today, having some ongoing abdominal discomfort.  Objective: Temp:  [98 F (36.7 C)-99.6 F (37.6 C)] 98.6 F (37 C) (04/10 0447) Pulse Rate:  [88-99] 90 (04/10 0447) Resp:  [15-20] 16 (04/10 0447) BP: (106-138)/(64-92) 133/69 (04/10 0447) SpO2:  [98 %-100 %] 99 % (04/10 0447) Weight:  [54.3 kg] 54.3 kg (04/09 1811) Physical Exam: General: Sitting up in bed, awake and alert, no acute distress HEENT: Atraumatic, normocephalic, MMM, clear conjunctiva bilaterally, no erythema or edema around left orbit Cardiovascular: RRR, normal S1/S2, no M/R/G Respiratory: CTAB, normal WOB on RA Abdomen: Normoactive bowel sounds, soft, mildly tender to palpation at lower quadrants, nondistended Extremities: No edema to BLE Neuro: Pupils equal, round, difficult to tell if reactive to light/consensual reflex. Markedly decreased (if not complete lack of) peripheral vision in L visual field of L eye.  EOM intact bilaterally.  Remainder of CN intact.  5/5 strength in RUE/RLE.  4/5 strength in LUE/LLE, limited by pain in L shoulder/L hip.  Laboratory: Most recent CBC Lab Results  Component Value Date   WBC 10.6 (H) 12/27/2023   HGB 8.9 (L) 12/27/2023   HCT 27.0 (L) 12/27/2023   MCV 86.8 12/27/2023   PLT 250 12/27/2023   Most recent BMP    Latest Ref Rng & Units 12/27/2023    5:40 AM  BMP  Glucose 70 - 99 mg/dL 914   BUN 6 - 20 mg/dL 13   Creatinine 7.82 - 1.00 mg/dL 9.56   Sodium 213 - 086 mmol/L 132   Potassium 3.5 - 5.1 mmol/L 4.0   Chloride 98 - 111 mmol/L 100   CO2 22 - 32 mmol/L 22   Calcium 8.9 - 10.3 mg/dL 8.1     Other pertinent labs: pending A1c  Imaging/Diagnostic Tests: CT head code stroke Normal CT of the brain and orbits.   CT angio head/neck No emergent finding or explanation for symptoms. There is symmetric enhancement of the ophthalmic arteries and no stenosis or embolic source seen in the neck.   Lorayne Bender, MD 12/27/2023,  7:21 AM  PGY-1, [facility] FPTS Intern pager: [phone], text pages welcome Secure chat group [facility] Teaching Service

## 2023-12-27 NOTE — Consult Note (Signed)
 NEUROLOGY CONSULT NOTE   Date of service: December 27, 2023 Patient Name: Shelby Brown MRN:  [id number] DOB:  Sep 21, 1980 Chief Complaint: "abd pain " Requesting Provider: Doreene Eland, MD  History of Present Illness  Mary-Ann Pennella is a 43 y.o. female with hx of DM admitted on 12/25/2023 for N/V and abd pain and found to have HHS. Today she c/o left sided vision problems and numbness of left side of body. Code stroke was activated. Patient was seen and examined with the use of interpretor via phone. Per patient she has had numbness for quite a while however today it became worse with vision problems. NIHSS 2 for left hemianopia and left decreased sensation on left leg. CBG 183.   LKW: Tuesday  Modified rankin score: 0-Completely asymptomatic and back to baseline post- stroke IV Thrombolysis:  No outside window, low NIHSS  EVT: No LVO   NIHSS components Score: Comment  1a Level of Conscious 0[x]  1[]  2[]  3[]      1b LOC Questions 0[x]  1[]  2[]       1c LOC Commands 0[x]  1[]  2[]       2 Best Gaze 0[x]  1[]  2[]       3 Visual 0[]  1[x]  2[]  3[]      4 Facial Palsy 0[x]  1[]  2[]  3[]      5a Motor Arm - left 0[x]  1[]  2[]  3[]  4[]  UN[]    5b Motor Arm - Right 0[x]  1[]  2[]  3[]  4[]  UN[]    6a Motor Leg - Left 0[x]  1[]  2[]  3[]  4[]  UN[]    6b Motor Leg - Right 0[x]  1[]  2[]  3[]  4[]  UN[]    7 Limb Ataxia 0[x]  1[]  2[]  3[]  UN[]     8 Sensory 0[]  1[x]  2[]  UN[]      9 Best Language 0[x]  1[]  2[]  3[]      10 Dysarthria 0[x]  1[]  2[]  UN[]      11 Extinct. and Inattention 0[x]  1[]  2[]       TOTAL: 2      ROS  Comprehensive ROS performed and pertinent positives documented in HPI    Past History   Past Medical History:  Diagnosis Date   Diabetes mellitus without complication (HCC)     Past Surgical History:  Procedure Laterality Date   CESAREAN SECTION     TUBAL LIGATION      Family History: Family History  Problem Relation Age of Onset   Cancer Father    Diabetes Sister    Diabetes  Sister     Social History  reports that she has never smoked. She has never used smokeless tobacco. She reports that she does not drink alcohol and does not use drugs.  No Known Allergies  Medications   Current Facility-Administered Medications:    cefTRIAXone (ROCEPHIN) 1 g in sodium chloride 0.9 % 100 mL IVPB, 1 g, Intravenous, Q24H, Jones, Sarah, DO, Last Rate: 200 mL/hr at 12/27/23 1041, 1 g at 12/27/23 1041   dextrose 50 % solution 0-50 mL, 0-50 mL, Intravenous, PRN, Erick Alley, DO   enoxaparin (LOVENOX) injection 30 mg, 30 mg, Subcutaneous, Q24H, Hammons, Kimberly B, RPH, 30 mg at 12/27/23 4098   insulin aspart (novoLOG) injection 0-5 Units, 0-5 Units, Subcutaneous, QHS, Baloch, Mahnoor, MD, 3 Units at 12/26/23 2234   insulin aspart (novoLOG) injection 0-9 Units, 0-9 Units, Subcutaneous, TID WC, Baloch, Mahnoor, MD, 3 Units at 12/27/23 0755   lactated ringers infusion, , Intravenous, Continuous, Ivery Quale, MD, Last Rate: 125 mL/hr at 12/27/23 1047, New Bag at 12/27/23 1047   Oral care  mouth rinse, 15 mL, Mouth Rinse, PRN, Latrelle Dodrill, MD  Vitals   Vitals:   12/27/23 0447 12/27/23 0800 12/27/23 0944 12/27/23 1000  BP: 133/69 138/75 (!) 162/85 139/86  Pulse: 90 86 91 92  Resp: 16 20    Temp: 98.6 F (37 C) 97.6 F (36.4 C)    TempSrc: Oral Axillary    SpO2: 99%  100% 97%  Weight:      Height:        Body mass index is 23.36 kg/m.  Physical Exam   Constitutional: Appears well-developed and well-nourished.  Psych: Affect appropriate to situation.  Eyes: No scleral injection.  HENT: No OP obstruction.  Head: Normocephalic.  Cardiovascular: Normal rate and regular rhythm.  Respiratory: Effort normal, non-labored breathing.  GI: Soft.  No distension. There is no tenderness.  Skin: WDI.   Neurologic Examination   Mental Status -  Level of arousal and orientation to time, place, and person were intact. Language including expression, naming, repetition,  comprehension was assessed and found intact. Attention span and concentration were normal. Recent and remote memory were intact. Fund of Knowledge was assessed and was intact.  Cranial Nerves II - XII - II - Visual field intact in the right eye, in the left eye she is able to see hand waving, but unable to count fingers. III, IV, VI - Extraocular movements intact. V - Facial sensation intact bilaterally. VII - Facial movement intact bilaterally. VIII - Hearing & vestibular intact bilaterally. X - Palate elevates symmetrically. XI - Chin turning & shoulder shrug intact bilaterally. XII - Tongue protrusion intact.  Motor Strength - The patient's strength was normal in all extremities and pronator drift was absent.  Bulk was normal and fasciculations were absent.   Motor Tone - Muscle tone was assessed at the neck and appendages and was normal. Sensory - decreased on left leg    Coordination - The patient had normal movements in the hands and feet with no ataxia or dysmetria.  Tremor was absent. Gait and Station - deferred.  Labs/Imaging/Neurodiagnostic studies   CBC:  Recent Labs  Lab 2024-01-10 0538 12/27/23 0540  WBC 18.3* 10.6*  HGB 9.7* 8.9*  HCT 28.7* 27.0*  MCV 85.4 86.8  PLT 279 250   Basic Metabolic Panel:  Lab Results  Component Value Date   NA 132 (L) 12/27/2023   K 4.0 12/27/2023   CO2 22 12/27/2023   GLUCOSE 217 (H) 12/27/2023   BUN 13 12/27/2023   CREATININE 0.71 12/27/2023   CALCIUM 8.1 (L) 12/27/2023   GFRNONAA >60 12/27/2023   GFRAA 105 03/12/2018   Lipid Panel:  Lab Results  Component Value Date   LDLCALC 119 (H) 12/01/2008   HgbA1c:  Lab Results  Component Value Date   HGBA1C 12.8 (H) 03/26/2021   Urine Drug Screen:     Component Value Date/Time   LABOPIA NONE DETECTED 10/27/2017 0306   COCAINSCRNUR NONE DETECTED 10/27/2017 0306   LABBENZ NONE DETECTED 10/27/2017 0306   AMPHETMU NONE DETECTED 10/27/2017 0306   THCU NONE DETECTED 10/27/2017  0306   LABBARB NONE DETECTED 10/27/2017 0306    Alcohol Level     Component Value Date/Time   ETH 109 (H) 10/27/2017 0318   CT Head without contrast(Personally reviewed): Normal   CT angio Head and Neck with contrast(Personally reviewed): No emergent finding or explanation for symptoms. There is symmetric enhancement of the ophthalmic arteries and no stenosis or embolic source seen in the neck.  ASSESSMENT   Angelena Sand is a 43 y.o. female who presented on 4/8 for nausea and vomiting and lower left abd pain and was found to have HHS and pyelonephritis.  This morning she endorsed having left eye vision problems and left hemibody numbness, Code stroke was activated   RECOMMENDATIONS  -obtain MRI brain to evaluate for stroke. If positive for acute stroke will continue with stroke workup  - Q2 hr neurochecks  - BP goal < 220/120 ______________________________________________________________________  Signed, Mathews Argyle, NP [facility]  I have seen the patient and reviewed the above note.  She had what she describes as an abrupt visual change in her left eye.  She also complains of pain in that eye.  With an abrupt painful monocular vision loss, this is unlikely to be neurological in etiology and I would recommend ophthalmologic consultation.  She also describes left hemibody numbness which she states has been longstanding, but is abruptly worsening.  She also complains of pain on the left side as well.  It is difficult for me to say how much of this is due to pain versus true numbness given the language barrier.  I do think that an MRI would be reasonable to rule out acute ischemia, but if this is negative then I would continue treating her HHS and could consider trialing a dose of Reglan for possible migraine.  Neurology will follow-up MRI, but if negative we will be available only on an as-needed basis.  Ritta Slot, MD [facility]   If 7pm- 7am, please page neurology on call as listed in AMION.

## 2023-12-27 NOTE — Assessment & Plan Note (Addendum)
 Blood glucose in the high 100s/mid 200s over the past day.  Received 14 units of sliding scale yesterday. Home regimen is metformin 500 mg twice a day, does not use insulin.  Per discussion with diabetes educator, patient self discontinued metformin due to fear that it was interacting with the Keflex she was prescribed for her pyelo.  Spoke at length with patient and family, who agreed to use insulin.  They would also like to follow-up in [facility] clinic for PCP care. Got 2 L LR bolus and has been receiving LR infusion since admission.  Starting p.o. intake today, reports history of acid reflux, does not take meds for this.  Blood pressures WNL.  Sodium corrects to 134, electrolytes WNL otherwise.  Ha1c >15.5, more than likely this explains chronic vision/neurologic symptoms.  -- Continue sensitive SSI, correction coverage --Pepcid 20 mg 2 times a day - Discontinue IV fluids -- Encourage good p.o. intake -- Will schedule hospital follow-up at [facility], appointment with pharmacist Dr. Raymondo Band

## 2023-12-28 ENCOUNTER — Other Ambulatory Visit (HOSPITAL_COMMUNITY): Payer: Self-pay

## 2023-12-28 LAB — CBC
HCT: 28.1 % — ABNORMAL LOW (ref 36.0–46.0)
Hemoglobin: 9.3 g/dL — ABNORMAL LOW (ref 12.0–15.0)
MCH: 28.6 pg (ref 26.0–34.0)
MCHC: 33.1 g/dL (ref 30.0–36.0)
MCV: 86.5 fL (ref 80.0–100.0)
Platelets: 307 10*3/uL (ref 150–400)
RBC: 3.25 MIL/uL — ABNORMAL LOW (ref 3.87–5.11)
RDW: 12.1 % (ref 11.5–15.5)
WBC: 8.9 10*3/uL (ref 4.0–10.5)
nRBC: 0 % (ref 0.0–0.2)

## 2023-12-28 LAB — VITAMIN B12: Vitamin B-12: 766 pg/mL (ref 180–914)

## 2023-12-28 LAB — BASIC METABOLIC PANEL WITH GFR
Anion gap: 8 (ref 5–15)
BUN: 10 mg/dL (ref 6–20)
CO2: 23 mmol/L (ref 22–32)
Calcium: 8.4 mg/dL — ABNORMAL LOW (ref 8.9–10.3)
Chloride: 103 mmol/L (ref 98–111)
Creatinine, Ser: 0.75 mg/dL (ref 0.44–1.00)
GFR, Estimated: >60 mL/min (ref >60)
Glucose, Bld: 264 mg/dL — ABNORMAL HIGH (ref 70–99)
Potassium: 3.7 mmol/L (ref 3.5–5.1)
Sodium: 134 mmol/L — ABNORMAL LOW (ref 135–145)

## 2023-12-28 LAB — FOLATE: Folate: 13.4 ng/mL (ref >5.9)

## 2023-12-28 LAB — GLUCOSE, CAPILLARY
Glucose-Capillary: 227 mg/dL — ABNORMAL HIGH (ref 70–99)
Glucose-Capillary: 278 mg/dL — ABNORMAL HIGH (ref 70–99)
Glucose-Capillary: 371 mg/dL — ABNORMAL HIGH (ref 70–99)
Glucose-Capillary: 332 mg/dL — ABNORMAL HIGH (ref 70–99)

## 2023-12-28 LAB — HIV ANTIBODY (ROUTINE TESTING W REFLEX): HIV Screen 4th Generation wRfx: NONREACTIVE

## 2023-12-28 LAB — RPR: RPR Ser Ql: NONREACTIVE

## 2023-12-28 LAB — TSH: TSH: 0.37 u[IU]/mL (ref 0.350–4.500)

## 2023-12-28 MED ORDER — ACETAMINOPHEN 325 MG PO TABS: 650.0000 mg | ORAL_TABLET | Freq: Four times a day (QID) | ORAL | Status: DC | PRN

## 2023-12-28 MED ORDER — LIVING WELL WITH DIABETES BOOK - IN SPANISH
Freq: Once | Status: AC
  Filled 2023-12-28 (×2): qty 1

## 2023-12-28 MED ORDER — INSULIN GLARGINE-YFGN 100 UNIT/ML ~~LOC~~ SOLN
5.0000 [IU] | Freq: Every day | SUBCUTANEOUS | Status: DC
  Administered 2023-12-28 – 2023-12-29 (×2): 5 [IU] via SUBCUTANEOUS
  Filled 2023-12-28 (×2): qty 0.05

## 2023-12-28 NOTE — Discharge Instructions (Addendum)
  Shelby Brown,   Muchas gracias por permitirnos ser parte de su atencin! Ingres al [facility] por un nivel muy alto de azcar en la sangre e infeccin renal. Recibi tratamiento con antibiticos para la infeccin renal. Ajustamos sus medicamentos para la diabetes para controlar mejor su azcar en la sangre. Recibir seguimiento con nuestro [facility], su mdico de cabecera.  INSTRUCCIONES DE CUIDADO Y DESPUS DE LA HOSPITALIZACIN 1. Por favor, informe a su mdico de cabecera/especialistas sobre cualquier cambio realizado. 2. Consulte la seccin de medicamentos de este paquete para cualquier cambio en la medicacin.  CITA MDICA E INSTRUCCIONES DE SEGUIMIENTO [place] [facility] 14/12/2023 13:45 [facility] 01/22/2024 09:50 de [place], Alonza Jansky, MD [facility]  Cita a las 8:00 del [place] 17 de abril con el Dr. Alva Auer, [facility]  PRECAUCIONES PARA EL REGRESO: - Diedra Fowler o aturdimiento - Vmitos continuos - Dolor de cabeza intenso - Debilidad  Cudese y que se mejore!  Servicio de [place] [facility] [address] [place], [place] [address] [phone]  ______  Thank you so much for allowing us  to be part of your care!  You were admitted to [facility] for very high blood sugar and kidney infection. You were treated with antibiotics for your kidney infection. We adjusted your diabetes medications to better control your blood sugar. You will follow up with our [facility] as your primary care provider.    POST-HOSPITAL & CARE INSTRUCTIONS Please let PCP/Specialists know of any changes that were made.  Please see medications section of this packet for any medication changes.   DOCTOR'S APPOINTMENT & FOLLOW UP CARE INSTRUCTIONS  Future Appointments  Date Time Provider Department Center   12/31/2023  1:45 PM [facility]  02/21/2024  9:50 AM de [place], Raymond J, MD [facility]   Appointment at 8 AM on Thursday, 17 April with Dr. Alva Auer [facility]   RETURN PRECAUTIONS: - Dizziness or lightheadedness - Nonstop vomiting  - Severe headache - Weakness  Take care and be well!  Family Medicine Teaching Service  [place]  [facility]  [address] [place], [place] [address] [phone]

## 2023-12-28 NOTE — Assessment & Plan Note (Signed)
>>  ASSESSMENT AND PLAN FOR VISION LOSS WRITTEN ON 12/28/2023 11:53 AM BY Naida Austria, MD  Patient reports left-sided vision loss.  CT angio head showed symmetric enhancement of ophthalmic arteries.  Consulted with ophthalmology, who suspect that vision loss could be secondary to osmotic swelling of lens due to hyperglycemia versus vitreous hemorrhage in the setting of uncontrolled diabetic retinopathy.  Did not recommend acute interventions. --Ophthalmic allergy appointment scheduled for Thursday, April 17 at 8 AM

## 2023-12-28 NOTE — Assessment & Plan Note (Addendum)
 HA1C >15.5. CBGs in mid 200s overnight. Patient/family agreed to use insulin outpatient.  Will schedule follow-up at [facility] and with Dr. Raymondo Band for possible CGM rx. -- Diabetes educator to see again today -- Continue sensitive SSI, correction coverage -- Pepcid 20 mg twice daily -- Encourage good p.o. intake

## 2023-12-28 NOTE — Assessment & Plan Note (Addendum)
 EKG, tropes negative x 2.  Likely MSK in setting of forceful vomiting. Not complaining of CP this morning. -- CTM

## 2023-12-28 NOTE — Assessment & Plan Note (Signed)
 Depression: Not on meds, will add to PCP follow-up Normocytic anemia: Hemoglobin stable

## 2023-12-28 NOTE — Assessment & Plan Note (Deleted)
 Home medication includes metformin 500 mg twice daily.  Previously declined insulin during last ED visit. -f/u A1c -Transition to long-acting insulin and SSI when able -Discussed long-term management options with patient prior to discharge

## 2023-12-28 NOTE — Inpatient Diabetes Management (Signed)
 Inpatient Diabetes Program Recommendations  AACE/ADA: New Consensus Statement on Inpatient Glycemic Control (2015)  Target Ranges:  Prepandial:   less than 140 mg/dL      Peak postprandial:   less than 180 mg/dL (1-2 hours)      Critically ill patients:  140 - 180 mg/dL   Lab Results  Component Value Date   GLUCAP 227 (H) 12/28/2023   HGBA1C >15.5 (H) 12/26/2023    Review of Glycemic Control  Latest Reference Range & Units 12/27/23 07:59 12/27/23 09:51 12/27/23 12:42 12/27/23 16:17 12/27/23 21:23 12/28/23 08:23  Glucose-Capillary 70 - 99 mg/dL 161 (H) 096 (H) 045 (H) 180 (H) 258 (H) 227 (H)   Diabetes history: DM 2 Outpatient Diabetes medications: Metformin and insulin in the past Current orders for Inpatient glycemic control:  Novolog 0-9 units tid + hs  A1c >15.5 on 4/9 Pt agreeable to insulin at this time after discussion with MD. Pt to follow up at Clinic with [facility] initially however it needs to be stressed that pt can get [employer] 70/30 for $43 box of 5 insulin pens which will last them 3 months at current recommended dose.  Spoke with pt and husband at bedside. Showed insulin pen. Pt wants to have daughter actually given injections. Daughter to come sometime today and we will teach daughter the insulin pen.   Thanks,  Christena Deem RN, MSN, BC-ADM Inpatient Diabetes Coordinator Team Pager [phone] (8a-5p)

## 2023-12-28 NOTE — TOC Progression Note (Signed)
 Transition of Care [facility]) - Progression Note    Patient Details  Name: Shelby Brown MRN: [id number] Date of Birth: April 11, 1981  Transition [facility]) CM/SW Contact  Leone Haven, RN Phone Number: 12/28/2023, 11:10 AM  Clinical Narrative:    Per Reina Fuse OT, just informed this NCM that she was just speaking with Beth the physical therapist and patient does not need HHPT.         Expected Discharge Plan and Services                                               Social Determinants of Health (SDOH) Interventions SDOH Screenings   Food Insecurity: No Food Insecurity (12/25/2023)  Housing: Low Risk  (12/25/2023)  Transportation Needs: No Transportation Needs (12/25/2023)  Utilities: Not At Risk (12/25/2023)  Alcohol Screen: Low Risk  (11/01/2017)  Tobacco Use: Low Risk  (12/25/2023)    Readmission Risk Interventions     No data to display

## 2023-12-28 NOTE — TOC Transition Note (Signed)
 Transition of Care [facility]) - Discharge Note   Patient Details  Name: Shelby Brown MRN: [id number] Date of Birth: October 29, 1980  Transition of Care [facility]) CM/SW Contact:  Leone Haven, RN Phone Number: 12/28/2023, 3:41 PM   Clinical Narrative:     For possible dc today, NCM spoke with patient using intrepreter [phone] (Spanish), she states she does not have any funds with her to pay for meds, will need Match Card ast.  Per Occupational therapist the [employer] no longer have funding for vision, she can report to [employer]  and /or [employer] in [place],  [place] informed her of this information.  She states she understands.  She also states if she goes home today she will have her family member to pick her up , but if she goes home tomorrow she will not have any transportation.         Patient Goals and CMS Choice            Discharge Placement                       Discharge Plan and Services Additional resources added to the After Visit Summary for                                       Social Drivers of Health (SDOH) Interventions SDOH Screenings   Food Insecurity: No Food Insecurity (12/25/2023)  Housing: Low Risk  (12/25/2023)  Transportation Needs: No Transportation Needs (12/25/2023)  Utilities: Not At Risk (12/25/2023)  Alcohol Screen: Low Risk  (11/01/2017)  Tobacco Use: Low Risk  (12/25/2023)     Readmission Risk Interventions    12/28/2023    3:39 PM  Readmission Risk Prevention Plan  Post Dischage Appt Complete  Medication Screening Complete  Transportation Screening Complete

## 2023-12-28 NOTE — Evaluation (Addendum)
 Addendum: Pt does not need HHPT, or PT follow up. OT determined problem with vision and has requested follow up with ophthalmologist   Physical Therapy Evaluation Patient Details Name: Shelby Brown MRN: [id number] DOB: 1980-10-06 Today's Date: 12/28/2023  History of Present Illness  43 y.o. female presenting to ED 4/8 with intractable nausea and vomiting with left lower abdominal pain. CT abdomen pelvis with left perinephric fat stranding, mild diffuse cortical edema with left hydronephrosis and hydroureter, R hydronephrosis without signs of inflammation.Admitted for treatment of pyelonephritis. 4/10 experienced  left eye vision problems and left hemibody numbness, Code stroke was activated. PMH:  DM 2, depression, normocytic anemia  Clinical Impression  Pt refused use of iPad interpreter preferring husband to provide interpretation. Pt's husband provides PLOF and home set up. PTA pt living with husband and 3 daughters in single story home with 2 steps to enter. Husband reports that pt was able to ambulate independently and was independent in ADLs and iADLs. Pt is currently limited in safe mobility by abdominal, and joint pain LLE > RLE, ROM is full but produces decreased power due to pain, also exhibits decreased vision in L eye, reports today she is able to see shapes but not details with L eye. Pt is currently mod I for bed mobility and transfers, pt is supervision for ambulation with RW and contact guard for ambulation without AD. PT recommending spanish speaking HHPT at discharge. PT will continue to follow acutely.       If plan is discharge home, recommend the following: A little help with walking and/or transfers;A little help with bathing/dressing/bathroom;Assist for transportation;Help with stairs or ramp for entrance   Can travel by private vehicle    Yes    Equipment Recommendations None recommended by PT     Functional Status Assessment Patient has had a recent decline in  their functional status and demonstrates the ability to make significant improvements in function in a reasonable and predictable amount of time.     Precautions / Restrictions Precautions Precautions: Fall Restrictions Weight Bearing Restrictions Per Provider Order: No      Mobility  Bed Mobility Overal bed mobility: Independent                  Transfers Overall transfer level: Independent                      Ambulation/Gait Ambulation/Gait assistance: Supervision, Contact guard assist Gait Distance (Feet): 40 Feet (+40) Assistive device: Rolling walker (2 wheels), None Gait Pattern/deviations: Shuffle, Decreased step length - right, Decreased stance time - left, Decreased weight shift to left, Narrow base of support Gait velocity: variable able to ambulate with increased velocity with RW Gait velocity interpretation: <1.31 ft/sec, indicative of household ambulator   General Gait Details: supervision for ambulation with RW, slightly unstable no overt LoB, pt ambulates at a CGA level without AD, decreased functional strength on L side but difficult to determine if it is mainly due to increased pain in hip and knee, also decreased BoS and some drifting recommended that pt use RW when she first gets home      Balance Overall balance assessment: Needs assistance Sitting-balance support: Feet unsupported, No upper extremity supported Sitting balance-Leahy Scale: Normal     Standing balance support: No upper extremity supported, During functional activity Standing balance-Leahy Scale: Fair Standing balance comment: benefits from UE support with standing and dynamic balance  Pertinent Vitals/Pain Pain Assessment Pain Assessment: 0-10 Pain Score: 6  Pain Location: abdomen Pain Intervention(s): Limited activity within patient's tolerance, Monitored during session    Home Living Family/patient expects to be discharged  to:: Private residence Living Arrangements: Spouse/significant other;Children;Other relatives Available Help at Discharge: Family;Available 24 hours/day Type of Home: House Home Access: Stairs to enter   [employer] of Steps: 2   Home Layout: One level Home Equipment: Agricultural consultant (2 wheels);Grab bars - tub/shower;Hand held shower head      Prior Function Prior Level of Function : Independent/Modified Independent             Mobility Comments: ambulating without AD       Extremity/Trunk Assessment   Upper Extremity Assessment Upper Extremity Assessment: Defer to OT evaluation    Lower Extremity Assessment Lower Extremity Assessment: RLE deficits/detail;LLE deficits/detail RLE Deficits / Details: ROM painful at end range of joints but full, strength grossly 3+/5 RLE Sensation: decreased light touch (below knee) LLE Deficits / Details: ROM painful at end range of joints but full, strength grossly 3+/5 LLE Sensation: decreased light touch (below knee)    Cervical / Trunk Assessment Cervical / Trunk Assessment: Normal  Communication   Communication Communication: Impaired Factors Affecting Communication: Non - English speaking, interpreter not available (requested huband provide interpretation, but not all questions/commands translated to patient)    Cognition Arousal: Alert Behavior During Therapy: WFL for tasks assessed/performed   PT - Cognitive impairments: Difficult to assess Difficult to assess due to: Non-English speaking                       Following commands: Intact       Cueing Cueing Techniques: Verbal cues, Tactile cues, Visual cues, Gestural cues     General Comments General comments (skin integrity, edema, etc.): Pt with L eye vision deficits, reports that she has gross vision out of L eye but can not distinguish detail, ie she can see where a hand is but can not distinguish fingers        Assessment/Plan    PT  Assessment Patient needs continued PT services  PT Problem List Impaired sensation;Decreased strength;Decreased activity tolerance       PT Treatment Interventions DME instruction;Gait training;Stair training;Functional mobility training;Therapeutic activities;Therapeutic exercise;Balance training;Cognitive remediation;Patient/family education    PT Goals (Current goals can be found in the Care Plan section)  Acute Rehab PT Goals PT Goal Formulation: With patient/family Time For Goal Achievement: 01/11/24 Potential to Achieve Goals: Fair    Frequency Min 2X/week        AM-PAC PT "6 Clicks" Mobility  Outcome Measure Help needed turning from your back to your side while in a flat bed without using bedrails?: None Help needed moving from lying on your back to sitting on the side of a flat bed without using bedrails?: None Help needed moving to and from a bed to a chair (including a wheelchair)?: None Help needed standing up from a chair using your arms (e.g., wheelchair or bedside chair)?: None Help needed to walk in hospital room?: A Little Help needed climbing 3-5 steps with a railing? : A Little 6 Click Score: 22    End of Session Equipment Utilized During Treatment: Gait belt Activity Tolerance: Patient tolerated treatment well Patient left: in bed;with call bell/phone within reach;with family/visitor present (sitting EoB eating breakfast) Nurse Communication: Mobility status PT Visit Diagnosis: Unsteadiness on feet (R26.81);Muscle weakness (generalized) (M62.81);Difficulty in walking, not elsewhere classified (R26.2)  Time: 1610-9604 PT Time Calculation (min) (ACUTE ONLY): 31 min   Charges:   PT Evaluation $PT Eval Low Complexity: 1 Low PT Treatments $Gait Training: 8-22 mins PT General Charges $$ ACUTE PT VISIT: 1 Visit         Reiana Poteet B. Beverely Risen PT, DPT Acute Rehabilitation Services Please use secure chat or  Call Office [phone]   Elon Alas [facility] 12/28/2023, 9:45 AM

## 2023-12-28 NOTE — Assessment & Plan Note (Addendum)
 VSS and afebrile.  White cell count WNL. -- Continue cefuroxime 500 twice daily to end 4/17 -- Tylenol 650mg  q6h PRN

## 2023-12-28 NOTE — Progress Notes (Signed)
 Daily Progress Note Intern Pager: [phone]  Patient name: Shelby Brown Medical record number: [id number] Date of birth: Aug 17, 1981 Age: 43 y.o. Gender: female  Primary Care Provider: Patient, No Pcp Per Consultants: DM education Code Status: Full  Pt Overview and Major Events to Date:  4/8: Admitted to FMTS, started EndoTool, transitioned to subcutaneous insulin  Assessment and Plan: Shelby Brown is a 43 year old female who presented with nausea, vomiting, and abd pain found to have HHS and pyelonephritis in the setting of medication nonadherence. Good PO intake today, titrating insulin regimen and will have close follow up at [facility] and with clinical pharmacist. Recovering well from pyelo, will continue PO ABX.  Pertinent PMH/PSH includes DMII, depression, normocytic anemia Assessment & Plan Hyperosmolar hyperglycemic state (HHS) (HCC) HA1C >15.5. CBGs in mid 200s overnight. Patient/family agreed to use insulin outpatient.  Will schedule follow-up at [facility] and with Dr. Raymondo Band for possible CGM rx. -- Diabetes educator to see again today -- Continue sensitive SSI, correction coverage -- Pepcid 20 mg twice daily -- Encourage good p.o. intake Pyelonephritis VSS and afebrile.  White cell count WNL. -- Continue cefuroxime 500 twice daily to end 4/17 -- Tylenol 650mg  q6h PRN Chest pain EKG, tropes negative x 2.  Likely MSK in setting of forceful vomiting. Not complaining of CP this morning. -- CTM Concern for acute stroke Patient reported headache, acute vision loss, paresthesias yesterday morning.  On further discussion the symptoms have been chronic.  CT code stroke negative for acute pathology, neurology recommended MRI which was also negative.  Workup for neuropathy/paresthesias show B12, TSH, folate WNL, HIV negative. Headache this morning sounds consistent with tension-type headache -- tylenol PRN as above -- CTM for changes in HA/red flag symptoms  --Follow-up  RPR, thiamine --Will continue to explore chronic symptoms on outpatient basis Vision loss Patient reports left-sided vision loss.  CT angio head showed symmetric enhancement of ophthalmic arteries.  Consulted with ophthalmology, who suspect that vision loss could be secondary to osmotic swelling of lens due to hyperglycemia versus vitreous hemorrhage in the setting of uncontrolled diabetic retinopathy.  Did not recommend acute interventions. --Ophthalmic allergy appointment scheduled for Thursday, April 17 at 8 AM Chronic health problem Depression: Not on meds, will add to PCP follow-up Normocytic anemia: Hemoglobin stable  FEN/GI: regular PPx: lovenox Dispo: home pending clinical improvement  Subjective:  Pt feels well this morning, is eating breakfast with multiple cups of water at bedside. Reports some mild abdominal pain and a headache. States she normally takes tylenol for headache at home.   Objective: Temp:  [97.6 F (36.4 C)-98.6 F (37 C)] 98.4 F (36.9 C) (04/11 0414) Pulse Rate:  [86-98] 91 (04/11 0414) Resp:  [16-20] 20 (04/11 0414) BP: (138-162)/(75-86) 150/83 (04/11 0414) SpO2:  [97 %-100 %] 99 % (04/11 0414) Physical Exam: General: Sitting up on edge of bed, eating breakfast, pleasant and conversant, in NAD Cardiovascular: RRR, normal S1/S2, no m/r/g Respiratory: CTAB, normal WOB on RA, no w/r/r Abdomen: soft, nontender, nondistended Extremities: no edema to BLE Neuro: AAOx4, CNII-XII grossly intact, moving all extremities spontaneously, no focal deficits  Laboratory: Most recent CBC Lab Results  Component Value Date   WBC 8.9 12/28/2023   HGB 9.3 (L) 12/28/2023   HCT 28.1 (L) 12/28/2023   MCV 86.5 12/28/2023   PLT 307 12/28/2023   Most recent BMP    Latest Ref Rng & Units 12/28/2023    5:13 AM  BMP  Glucose 70 -  99 mg/dL 161   BUN 6 - 20 mg/dL 10   Creatinine 0.96 - 1.00 mg/dL 0.45   Sodium 409 - 811 mmol/L 134   Potassium 3.5 - 5.1 mmol/L 3.7    Chloride 98 - 111 mmol/L 103   CO2 22 - 32 mmol/L 23   Calcium 8.9 - 10.3 mg/dL 8.4     Folate: 91.4 TSH: 0.370 B12: 766 HIV nonreactive  Imaging/Diagnostic Tests: 1. Normal brain MRI. No acute intracranial abnormality identified. 2. Moderate paranasal sinus disease.  Lorayne Bender, MD 12/28/2023, 7:27 AM  PGY-1, [facility] FPTS Intern pager: [phone], text pages welcome Secure chat group [facility] Teaching Service

## 2023-12-28 NOTE — Assessment & Plan Note (Addendum)
 Patient reports left-sided vision loss.  CT angio head showed symmetric enhancement of ophthalmic arteries.  Consulted with ophthalmology, who suspect that vision loss could be secondary to osmotic swelling of lens due to hyperglycemia versus vitreous hemorrhage in the setting of uncontrolled diabetic retinopathy.  Did not recommend acute interventions. --Ophthalmic allergy appointment scheduled for Thursday, April 17 at 8 AM

## 2023-12-28 NOTE — Evaluation (Signed)
 Occupational Therapy Evaluation and Discharge Patient Details Name: Shelby Brown MRN: [id number] DOB: 1980/12/20 Today's Date: 12/28/2023   History of Present Illness   43 y.o. female presenting to ED 4/8 with intractable nausea and vomiting with left lower abdominal pain. CT abdomen pelvis with left perinephric fat stranding, mild diffuse cortical edema with left hydronephrosis and hydroureter, R hydronephrosis without signs of inflammation.Admitted for treatment of pyelonephritis. 4/10 experienced  left eye vision problems and left hemibody numbness, Code stroke was activated. PMH:  DM 2, depression, normocytic anemia     Clinical Impressions This 43 yo female admitted with above presents to acute OT with PLOF of being Mod I with basic ADLs, IADLs, but not driving due to loss of partial vision in left eye. Currently she is back to her baseline. I feel she needs to see an ophthalmologist to check for severity of her cataracts which she reports she has and possibly diabetic neuropathy--I have made her and her husband aware of this via the interpreter pad in room. No further OT needs, we will sign off.      Functional Status Assessment   Patient has not had a recent decline in their functional status     Equipment Recommendations   None recommended by OT      Precautions/Restrictions   Precautions Precautions: Fall (due to unfamiliar environment and decreased vision in left eye) Recall of Precautions/Restrictions: Intact Restrictions Weight Bearing Restrictions Per Provider Order: No     Mobility Bed Mobility Overal bed mobility: Independent                      Balance Overall balance assessment: Independent                                         ADL either performed or assessed with clinical judgement   ADL Overall ADL's : Modified independent                                             Vision Baseline  Vision/History: 1 Wears glasses Ability to See in Adequate Light: 2 Moderately impaired Vision Assessment?: Yes Eye Alignment: Within Functional Limits Ocular Range of Motion: Within Functional Limits Alignment/Gaze Preference: Within Defined Limits (she does tend to keep her head tilted to right but reports this is due to pain in her neck) Tracking/Visual Pursuits: Able to track stimulus in all quads without difficulty Convergence: Within functional limits Visual Fields:  (left eye mainly superior no vision with some vision inferiorly)            Pertinent Vitals/Pain Pain Assessment Pain Assessment: Faces Pain Score: 4  Pain Location: abdomen Pain Descriptors / Indicators: Aching Pain Intervention(s): Monitored during session, Limited activity within patient's tolerance     Extremity/Trunk Assessment Upper Extremity Assessment Upper Extremity Assessment: Overall WFL for tasks assessed        Communication Communication Communication: Impaired Factors Affecting Communication:  (non-english speaking, use interpreter pad services [place] [phone])   Cognition Arousal: Alert Behavior During Therapy: WFL for tasks assessed/performed                                 Following commands: Intact  Cueing  General Comments   Cueing Techniques: Verbal cues;Tactile cues;Visual cues;Gestural cues  Pt with L eye vision deficits, reports that she has gross vision out of L eye but can not distinguish detail, ie she can see where a hand is but can not distinguish fingers           Home Living Family/patient expects to be discharged to:: Private residence Living Arrangements: Spouse/significant other;Children;Other relatives Available Help at Discharge: Family;Available PRN/intermittently (husband and 2 kids work and younger child is in school) Type of Home: House Home Access: Stairs to enter Secretary/administrator of Steps: 2   Home Layout: One level      Bathroom Shower/Tub: Producer, television/film/video: Standard Bathroom Accessibility: No   Home Equipment: Agricultural consultant (2 wheels);Grab bars - tub/shower;Shower seat   Additional Comments: Does not drive      Prior Functioning/Environment Prior Level of Function : Independent/Modified Independent             Mobility Comments: ambulating without AD      OT Problem List: Impaired vision/perception        OT Goals(Current goals can be found in the care plan section)   Acute Rehab OT Goals Patient Stated Goal: for vision to be better         AM-PAC OT "6 Clicks" Daily Activity     Outcome Measure Help from another person eating meals?: None Help from another person taking care of personal grooming?: None Help from another person toileting, which includes using toliet, bedpan, or urinal?: None Help from another person bathing (including washing, rinsing, drying)?: None Help from another person to put on and taking off regular upper body clothing?: None Help from another person to put on and taking off regular lower body clothing?: None 6 Click Score: 24   End of Session Equipment Utilized During Treatment:  (none) Nurse Communication: Mobility status (secure chat)  Activity Tolerance: Patient tolerated treatment well Patient left: in bed;with call bell/phone within reach (non bed alarm needed, pt is independent being up and about)  OT Visit Diagnosis:  (low vision left eye)                Time: 1610-9604 OT Time Calculation (min): 36 min Charges:  OT General Charges $OT Visit: 1 Visit OT Evaluation $OT Eval Moderate Complexity: 1 Mod OT Treatments $Self Care/Home Management : 8-22 mins  Lindon Romp OT Acute Rehabilitation Services Office [phone]    Evette Georges 12/28/2023, 11:24 AM

## 2023-12-28 NOTE — Assessment & Plan Note (Addendum)
 Patient reported headache, acute vision loss, paresthesias yesterday morning.  On further discussion the symptoms have been chronic.  CT code stroke negative for acute pathology, neurology recommended MRI which was also negative.  Workup for neuropathy/paresthesias show B12, TSH, folate WNL, HIV negative. Headache this morning sounds consistent with tension-type headache -- tylenol PRN as above -- CTM for changes in HA/red flag symptoms  --Follow-up RPR, thiamine --Will continue to explore chronic symptoms on outpatient basis

## 2023-12-29 ENCOUNTER — Other Ambulatory Visit (HOSPITAL_COMMUNITY): Payer: Self-pay

## 2023-12-29 LAB — GLUCOSE, CAPILLARY
Glucose-Capillary: 255 mg/dL — ABNORMAL HIGH (ref 70–99)
Glucose-Capillary: 287 mg/dL — ABNORMAL HIGH (ref 70–99)
Glucose-Capillary: 268 mg/dL — ABNORMAL HIGH (ref 70–99)

## 2023-12-29 MED ORDER — INSULIN GLARGINE-YFGN 100 UNIT/ML ~~LOC~~ SOLN: 10.0000 [IU] | Freq: Every day | SUBCUTANEOUS | Status: DC

## 2023-12-29 MED ORDER — INSULIN GLARGINE-YFGN 100 UNIT/ML ~~LOC~~ SOPN
10.0000 [IU] | PEN_INJECTOR | Freq: Every day | SUBCUTANEOUS | 11 refills | Status: DC
  Filled 2023-12-29: qty 3, 30d supply, fill #0

## 2023-12-29 MED ORDER — METFORMIN HCL ER 500 MG PO TB24
500.0000 mg | ORAL_TABLET | Freq: Two times a day (BID) | ORAL | Status: DC
  Filled 2023-12-29: qty 1

## 2023-12-29 MED ORDER — METFORMIN HCL ER 500 MG PO TB24
500.0000 mg | ORAL_TABLET | Freq: Every day | ORAL | 0 refills | Status: DC
  Filled 2023-12-29: qty 30, 30d supply, fill #0

## 2023-12-29 MED ORDER — INSULIN GLARGINE-YFGN 100 UNIT/ML ~~LOC~~ SOLN
5.0000 [IU] | Freq: Once | SUBCUTANEOUS | Status: AC
  Administered 2023-12-29: 5 [IU] via SUBCUTANEOUS
  Filled 2023-12-29: qty 0.05

## 2023-12-29 MED ORDER — CEFUROXIME AXETIL 500 MG PO TABS
500.0000 mg | ORAL_TABLET | Freq: Two times a day (BID) | ORAL | 0 refills | Status: AC
  Filled 2023-12-29: qty 10, 5d supply, fill #0

## 2023-12-29 MED ORDER — METFORMIN HCL ER 500 MG PO TB24
500.0000 mg | ORAL_TABLET | Freq: Every day | ORAL | Status: DC
  Filled 2023-12-29: qty 1

## 2023-12-29 MED ORDER — FAMOTIDINE 20 MG PO TABS
20.0000 mg | ORAL_TABLET | Freq: Two times a day (BID) | ORAL | 0 refills | Status: DC
  Filled 2023-12-29: qty 30, 15d supply, fill #0

## 2023-12-29 NOTE — Assessment & Plan Note (Signed)
>>  ASSESSMENT AND PLAN FOR VISION LOSS WRITTEN ON 12/29/2023  1:30 PM BY Carey Chapman, MD  Reported left-sided vision loss.  CT angio head showed symmetric enhancement of ophthalmic arteries.  Consulted with ophthalmology, who suspect that vision loss could be secondary to osmotic swelling of lens due to hyperglycemia versus vitreous hemorrhage in the setting of uncontrolled diabetic retinopathy.   --Ophthalmology appointment scheduled for Thursday, April 17 at 8 AM

## 2023-12-29 NOTE — Assessment & Plan Note (Addendum)
 HA1C >15.5. CBGs high 200s-300s overnight. Required 21u SAI in last 24 in addition to 5u LAI. Patient/family agreed to use insulin outpatient.   -- Increase to 10u LAI daily -- Continue sensitive SSI -- Begin metformin XR 500 daily -- Pepcid 20 mg twice daily -- Scheduled for HFU at [facility]

## 2023-12-29 NOTE — Assessment & Plan Note (Deleted)
 EKG, tropes negative x 2.  Likely MSK in setting of forceful vomiting. Not complaining of CP this morning. -- CTM

## 2023-12-29 NOTE — Assessment & Plan Note (Addendum)
 VSS and afebrile.   -- Continue cefuroxime 500 twice daily (to end 4/17) -- Tylenol 650mg  q6h PRN

## 2023-12-29 NOTE — Plan of Care (Signed)
 Patient ID: Shelby Brown, female   DOB: 02/22/1981, 43 y.o.   MRN: [id number]    Problem: Education: Goal: Ability to describe self-care measures that may prevent or decrease complications (Diabetes Survival Skills Education) will improve Outcome: Adequate for Discharge Goal: Individualized Educational Video(s) Outcome: Adequate for Discharge   Problem: Coping: Goal: Ability to adjust to condition or change in health will improve Outcome: Adequate for Discharge   Problem: Fluid Volume: Goal: Ability to maintain a balanced intake and output will improve Outcome: Adequate for Discharge   Problem: Health Behavior/Discharge Planning: Goal: Ability to identify and utilize available resources and services will improve Outcome: Adequate for Discharge Goal: Ability to manage health-related needs will improve Outcome: Adequate for Discharge   Problem: Metabolic: Goal: Ability to maintain appropriate glucose levels will improve Outcome: Adequate for Discharge   Problem: Nutritional: Goal: Maintenance of adequate nutrition will improve Outcome: Adequate for Discharge Goal: Progress toward achieving an optimal weight will improve Outcome: Adequate for Discharge   Problem: Skin Integrity: Goal: Risk for impaired skin integrity will decrease Outcome: Adequate for Discharge   Problem: Tissue Perfusion: Goal: Adequacy of tissue perfusion will improve Outcome: Adequate for Discharge   Problem: Education: Goal: Knowledge of General Education information will improve Description: Including pain rating scale, medication(s)/side effects and non-pharmacologic comfort measures Outcome: Adequate for Discharge   Problem: Health Behavior/Discharge Planning: Goal: Ability to manage health-related needs will improve Outcome: Adequate for Discharge   Problem: Clinical Measurements: Goal: Ability to maintain clinical measurements within normal limits will improve Outcome: Adequate for  Discharge Goal: Will remain free from infection Outcome: Adequate for Discharge Goal: Diagnostic test results will improve Outcome: Adequate for Discharge Goal: Respiratory complications will improve Outcome: Adequate for Discharge Goal: Cardiovascular complication will be avoided Outcome: Adequate for Discharge   Problem: Activity: Goal: Risk for activity intolerance will decrease Outcome: Adequate for Discharge   Problem: Nutrition: Goal: Adequate nutrition will be maintained Outcome: Adequate for Discharge   Problem: Coping: Goal: Level of anxiety will decrease Outcome: Adequate for Discharge   Problem: Elimination: Goal: Will not experience complications related to bowel motility Outcome: Adequate for Discharge Goal: Will not experience complications related to urinary retention Outcome: Adequate for Discharge   Problem: Pain Managment: Goal: General experience of comfort will improve and/or be controlled Outcome: Adequate for Discharge   Problem: Safety: Goal: Ability to remain free from injury will improve Outcome: Adequate for Discharge   Problem: Skin Integrity: Goal: Risk for impaired skin integrity will decrease Outcome: Adequate for Discharge   Problem: Acute Rehab PT Goals(only PT should resolve) Goal: Pt Will Go Supine/Side To Sit Outcome: Adequate for Discharge Goal: Patient Will Transfer Sit To/From Stand Outcome: Adequate for Discharge Goal: Pt Will Perform Standing Balance Or Pre-Gait Outcome: Adequate for Discharge Goal: Pt Will Go Up/Down Stairs Outcome: Adequate for Discharge   Problem: Acute Rehab PT Goals(only PT should resolve) Goal: Pt Will Ambulate Outcome: Adequate for Discharge   Genella Kendall, RN

## 2023-12-29 NOTE — Assessment & Plan Note (Addendum)
 Patient reported headache, acute vision loss, paresthesias on 4/10.  On further discussion the symptoms have been chronic.  CT negative for acute pathology, neurology recommended MRI which was also negative.  Workup for neuropathy/paresthesias show B12, TSH, folate WNL, HIV/RPR negative. Suspect tension-type headache.  -- Thiamine pending  -- Tylenol PRN as above -- PCP to follow up outpatient

## 2023-12-29 NOTE — Discharge Summary (Signed)
 Family Medicine Teaching [facility] Discharge Summary  Patient name: Shelby Brown Medical record number: [id number] Date of birth: 1981-06-25 Age: 43 y.o. Gender: female Date of Admission: 12/25/2023  Date of Discharge: 12/29/23  Admitting Physician: Candee Cha, MD  Primary Care Provider: Dr Naida Austria to be PCP Consultants: DM Education, Optho (curb-sided)   Indication for Hospitalization: HHS, Pyelonephritis   Discharge Diagnoses/Problem List:  Principal Problem for Admission: HHS, Pyelonephritis  Other Problems addressed during stay:  Principal Problem:   Hyperosmolar hyperglycemic state (HHS) [facility]) Active Problems:   Pyelonephritis   Chest pain   Chronic health problem   Concern for acute stroke   Vision loss   HHS (hypothenar hammer syndrome) [facility])  Brief Hospital Course:  Shelby Brown is a 43 y.o.female with a history of DMII, Depression, Normocytic Anemia who was admitted to the [facility] Medicine Teaching Service at [facility] for [facility] and Pyelonephritis. Her hospital course is detailed below:  HHS Presented with sugars in the 600s and serum osmolality of 310.  Patient was not taking home metformin 500mg  BID or insulin due to concern that this caused N/V.  Started on endotool and able to be transitioned off the same day (4/8). She was treated with mIVF given dehydration, was tolerating p.o intake well by the time of discharge. Patient was started on insulin regimen while in the hospital and CBGs continue to be well controlled.  Discharge regimen is 10 u LAI daily, metformin XR 500 daily.   Pyelonephritis  Dehydration Patient presented with some lower abdominal pain and was found to have CT consistent with pyelonephritis. She was started on Ceftriaxone 1g q24h. She had some nausea and vomiting with decrease PO, so was continued on mIVF. Her PO intake improved throughout her stay. She was transitioned off IV antibiotics to Cefuroxime to end on 4/17.    Neuropathy/paresthesias Patient reported left-sided tingling, decrease sensation for several months.  This is likely in the setting of diabetic neuropathy given A1c >15.5.  Left vision loss Patient reported left-sided vision loss that started months to years ago.  CT head (obtained due to concern for acute stroke) showed symmetric enhancement of ophthalmic arteries.  Ophthalmology was consulted and recommended outpatient follow up. Scheduled for appointment with Dr Alva Auer of [facility] on 01/03/24 at 8AM.   Other chronic conditions were medically managed with home medications and formulary alternatives as necessary (Anemia, Depression)  PCP Follow-up Recommendations: Dr. Sampson Critchley to be PCP Ensure completion of antibiotics Review diabetes regimen Eval for need for antihypertensive- consider starting ACEi or ARB Repeat UA for microscopic hematuria outpatient Evaluate for need for treatment for depression Workup possible diabetic neuropathy Ophthalmology for diabetic eye exam, workup L eye vision loss   Disposition: Home  Discharge Condition: Improved  Discharge Exam:  Vitals:   12/29/23 0555 12/29/23 1520  BP: 128/72 108/65  Pulse: 79 90  Resp: 17 15  Temp: 99.7 F (37.6 C) 99.6 F (37.6 C)  SpO2: 98% 99%   General: Well-appearing. Resting comfortably in room. CV: Normal S1/S2. No extra heart sounds. Warm and well-perfused. Pulm: Breathing comfortably on room air. CTAB. No increased WOB. Abd: Soft, non-distended. Skin:  Warm, dry. Psych: Pleasant and appropriate.   Significant Procedures: N/a  Significant Labs and Imaging:  Recent Labs  Lab 12/28/23 0513  WBC 8.9  HGB 9.3*  HCT 28.1*  PLT 307   Recent Labs  Lab 12/28/23 0513  NA 134*  K 3.7  CL 103  CO2 23  GLUCOSE 264*  BUN 10  CREATININE 0.75  CALCIUM 8.4*    Lab Results  Component Value Date   HGBA1C >15.5 (H) 12/26/2023   Lab Results  Component Value Date   TSH 0.370  12/28/2023    CT A&P: acute L pyelonephritis, cystitis CT Head, Brain MRI: negative for acute stroke  Results/Tests Pending at Time of Discharge:  Unresulted Labs (From admission, onward)     Start     Ordered   12/28/23 0500  Vitamin B1  Tomorrow morning,   R       Question:  Specimen collection method  Answer:  Lab=Lab collect   12/27/23 1206            Discharge Medications:  Allergies as of 12/29/2023   No Known Allergies      Medication List     STOP taking these medications    metFORMIN 1000 MG tablet Commonly known as: GLUCOPHAGE Replaced by: metFORMIN 500 MG 24 hr tablet   NovoLIN 70/30 Kwikpen (70-30) 100 UNIT/ML KwikPen Generic drug: insulin isophane & regular human KwikPen       TAKE these medications    acetaminophen 325 MG tablet Commonly known as: TYLENOL Take 650 mg by mouth every 6 (six) hours as needed for mild pain (pain score 1-3) or moderate pain (pain score 4-6).   blood glucose meter kit and supplies Kit Dispense based on patient and insurance preference. Use up to four times daily as directed. (FOR ICD-9 250.00, 250.01).   cefUROXime 500 MG tablet Commonly known as: CEFTIN Tomar 1 tableta (500 mg en total) por va oral 2 (dos) veces al da con una comida durante 5 [place]. (Take 1 tablet (500 mg total) by mouth 2 (two) times daily with a meal for 5 days.)   famotidine 20 MG tablet Commonly known as: PEPCID Tomar 1 tableta (20 mg en total) por va oral 2 (dos) veces al [employer]. (Take 1 tablet (20 mg total) by mouth 2 (two) times daily.)   Insulin Syringes (Disposable) U-100 0.5 ML Misc 1 Package by Does not apply route in the morning and at bedtime.   metFORMIN 500 MG 24 hr tablet Commonly known as: GLUCOPHAGE-XR Tome 1 tableta (500 mg en total) por va oral diariamente con el desayuno. (Take 1 tablet (500 mg total) by mouth daily with breakfast.) Start taking on: December 30, 2023 Replaces: metFORMIN 1000 MG tablet   ondansetron 4 MG  disintegrating tablet Commonly known as: ZOFRAN-ODT Take 1 tablet (4 mg total) by mouth every 8 (eight) hours as needed for nausea or vomiting.   Semglee (yfgn) 100 UNIT/ML Pen Generic drug: insulin glargine-yfgn Inyectar 10 Unidades en la piel diariamente. (Inject 10 Units into the skin daily.) Start taking on: December 30, 2023        Discharge Instructions: Please refer to Patient Instructions section of EMR for full details.  Patient was counseled important signs and symptoms that should prompt return to medical care, changes in medications, dietary instructions, activity restrictions, and follow up appointments.   Follow-Up Appointments:  Follow-up Information     [place], Alonza Jansky, MD Follow up.   Specialty: Family Medicine Why: TIME 9:50 AM   PLEASE ARRIVE AT 9:30 AM DATE : JUNE 05 , 2025  PLEASE BRING ALL MEDICATION and ID Contact information: [address] [place] [address] [phone]         [facility] Follow up.   Why: 4/14 @ 1:45PM Contact information:  [address] [place]  [address] [phone]                Carey Chapman, MD 12/29/2023, 4:50 PM PGY-1, [facility]

## 2023-12-29 NOTE — Plan of Care (Signed)
  Problem: Fluid Volume: Goal: Ability to maintain a balanced intake and output will improve Outcome: Progressing   Problem: Nutritional: Goal: Progress toward achieving an optimal weight will improve Outcome: Progressing   Problem: Skin Integrity: Goal: Risk for impaired skin integrity will decrease Outcome: Progressing   Problem: Tissue Perfusion: Goal: Adequacy of tissue perfusion will improve Outcome: Progressing   Problem: Clinical Measurements: Goal: Will remain free from infection Outcome: Progressing Goal: Respiratory complications will improve Outcome: Progressing Goal: Cardiovascular complication will be avoided Outcome: Progressing   Problem: Nutrition: Goal: Adequate nutrition will be maintained Outcome: Progressing   Problem: Coping: Goal: Level of anxiety will decrease Outcome: Progressing   Problem: Elimination: Goal: Will not experience complications related to bowel motility Outcome: Progressing Goal: Will not experience complications related to urinary retention Outcome: Progressing   Problem: Pain Managment: Goal: General experience of comfort will improve and/or be controlled Outcome: Progressing   Problem: Safety: Goal: Ability to remain free from injury will improve Outcome: Progressing

## 2023-12-29 NOTE — Assessment & Plan Note (Addendum)
 Depression: Not on meds, recommend PCP follow-up Normocytic anemia: Hemoglobin overall stable during admission

## 2023-12-29 NOTE — Assessment & Plan Note (Addendum)
 Reported left-sided vision loss.  CT angio head showed symmetric enhancement of ophthalmic arteries.  Consulted with ophthalmology, who suspect that vision loss could be secondary to osmotic swelling of lens due to hyperglycemia versus vitreous hemorrhage in the setting of uncontrolled diabetic retinopathy.   --Ophthalmology appointment scheduled for Thursday, April 17 at 8 AM

## 2023-12-29 NOTE — Progress Notes (Addendum)
     Daily Progress Note Intern Pager: [phone]  Patient name: Amylia Collazos Medical record number: [id number] Date of birth: 1981/04/05 Age: 43 y.o. Gender: female  Primary Care Provider: Patient, No Pcp Per Consultants: DM education Code Status: Full  Pt Overview and Major Events to Date:  4/8: Admitted  Assessment and Plan:  Catharine Campos-Ramirez 43 y.o. F admitted for HHS and pyelonephritis. Continuing to optimize insulin regimen. Hopeful discharge later today.  Assessment & Plan Hyperosmolar hyperglycemic state (HHS) (HCC) HA1C >15.5. CBGs high 200s-300s overnight. Required 21u SAI in last 24 in addition to 5u LAI. Patient/family agreed to use insulin outpatient.   -- Increase to 10u LAI daily -- Continue sensitive SSI -- Begin metformin XR 500 daily -- Pepcid 20 mg twice daily -- [facility]uled [facility] at [facility]  Pyelonephritis VSS and afebrile.   -- Continue cefuroxime 500 twice daily (to end 4/17) -- Tylenol 650mg  q6h PRN Concern for acute stroke Patient reported headache, acute vision loss, paresthesias on 4/10.  On further discussion the symptoms have been chronic.  CT negative for acute pathology, neurology recommended MRI which was also negative.  Workup for neuropathy/paresthesias show B12, TSH, folate WNL, HIV/RPR negative. Suspect tension-type headache.  -- Thiamine pending  -- Tylenol PRN as above -- PCP to follow up outpatient  Vision loss Reported left-sided vision loss.  CT angio head showed symmetric enhancement of ophthalmic arteries.  Consulted with ophthalmology, who suspect that vision loss could be secondary to osmotic swelling of lens due to hyperglycemia versus vitreous hemorrhage in the setting of uncontrolled diabetic retinopathy.   --Ophthalmology appointment scheduled for Thursday, April 17 at 8 AM Chronic health problem Depression: Not on meds, recommend PCP follow-up Normocytic anemia: Hemoglobin overall stable during admission   FEN/GI:  Regular PPx: Lovenox Dispo: Home pending clinical improvement   Subjective:  Reports doing well this morning. Insulin going well. Continued loose stool. Continued global headache. Transient dizziness. Hopeful for discharge later today.   Objective: Temp:  [98.4 F (36.9 C)-99.7 F (37.6 C)] 99.7 F (37.6 C) (04/12 0555) Pulse Rate:  [79-101] 79 (04/12 0555) Resp:  [17-18] 17 (04/12 0555) BP: (100-128)/(59-72) 128/72 (04/12 0555) SpO2:  [98 %] 98 % (04/12 0555) Physical Exam: General: Well-appearing. Resting comfortably in room. CV: Normal S1/S2. No extra heart sounds. Warm and well-perfused. Pulm: Breathing comfortably on room air. CTAB anteriorly. No increased WOB. Abd: Soft, non-distended. No rebound or guarding.  Skin:  Warm, dry. Psych: Pleasant and appropriate.   Laboratory: Most recent CBC Lab Results  Component Value Date   WBC 8.9 12/28/2023   HGB 9.3 (L) 12/28/2023   HCT 28.1 (L) 12/28/2023   MCV 86.5 12/28/2023   PLT 307 12/28/2023   Most recent BMP    Latest Ref Rng & Units 12/28/2023    5:13 AM  BMP  Glucose 70 - 99 mg/dL 147   BUN 6 - 20 mg/dL 10   Creatinine 8.29 - 1.00 mg/dL 5.62   Sodium 130 - 865 mmol/L 134   Potassium 3.5 - 5.1 mmol/L 3.7   Chloride 98 - 111 mmol/L 103   CO2 22 - 32 mmol/L 23   Calcium 8.9 - 10.3 mg/dL 8.4     Carey Chapman, MD 12/29/2023, 12:58 PM  PGY-1, [facility] FPTS Intern pager: [phone], text pages welcome Secure chat group [facility] Teaching Service

## 2023-12-30 LAB — CULTURE, BLOOD (ROUTINE X 2)
Culture: NO GROWTH
Special Requests: ADEQUATE

## 2023-12-30 LAB — VITAMIN B1: Vitamin B1 (Thiamine): 74.4 nmol/L (ref 66.5–200.0)

## 2023-12-31 ENCOUNTER — Encounter: Payer: Self-pay | Admitting: Family Medicine

## 2023-12-31 ENCOUNTER — Ambulatory Visit (INDEPENDENT_AMBULATORY_CARE_PROVIDER_SITE_OTHER): Payer: Self-pay | Admitting: Family Medicine

## 2023-12-31 VITALS — BP 149/96 | HR 87 | Ht 63.0 in | Wt 114.4 lb

## 2023-12-31 DIAGNOSIS — Z794 Long term (current) use of insulin: Secondary | ICD-10-CM

## 2023-12-31 DIAGNOSIS — E1140 Type 2 diabetes mellitus with diabetic neuropathy, unspecified: Secondary | ICD-10-CM

## 2023-12-31 DIAGNOSIS — E1165 Type 2 diabetes mellitus with hyperglycemia: Secondary | ICD-10-CM

## 2023-12-31 DIAGNOSIS — R3129 Other microscopic hematuria: Secondary | ICD-10-CM

## 2023-12-31 DIAGNOSIS — H53462 Homonymous bilateral field defects, left side: Secondary | ICD-10-CM

## 2023-12-31 NOTE — Assessment & Plan Note (Signed)
 CTA head neck 12/27/23 with symmetric enhancement of ophthalmic arteries, ophthalmology evaluated patient in the hospital and recommended outpatient follow-up. I Called [facility] to confirm patient's appointment on 4/17 with Dr. Josefa Ni, patient provided with appointment date, time, address and stressed the importance of attending this appointment Vision testing abnormal today, 20/70 in right eye, 20/200 and left eye, 20/100 in both eyes.  Patient states she has not been able to see out of her left eye for 1 year

## 2023-12-31 NOTE — Patient Instructions (Addendum)
 Me alegra verte hoy. Gracias por venir.  Asuntos que hablamos hoy:  Tu cita con el oftalmlogo es el 17/4/25 a las 8:00 a. m. con el Dr. Josefa Ni. [address] [place], [place] [address]  Marca tu Semglee al 10 para tomarlo diariamente. Termina tus antibiticos el 17/4.  Por favor, siempre trae tus medicamentos.  Good to see you today - Thank you for coming in  Things we discussed today:  Your appointment with the eye doctor is on 01/03/24 at 8am with Dr. Josefa Ni [address] [place], [place] [address]  Dial your Semglee to 10 to take daily Complete your antibiotics to finish on 4/17   Please always bring your medication bottles

## 2023-12-31 NOTE — Progress Notes (Signed)
    SUBJECTIVE:   CHIEF COMPLAINT / HPI:   PCP Follow-up Recommendations: Ensure completion of antibiotics Review diabetes regimen Eval for need for antihypertensive- consider starting ACEi or ARB Repeat UA for microscopic hematuria outpatient Evaluate for need for treatment for depression Workup possible diabetic neuropathy Ophthalmology for diabetic eye exam, workup L eye vision loss  DC on cefuroxime 500mg  BID, to end on 4/17 Diabetes regimen - 10U LAI daily, Metformin 500mg  daily Ophtho - has appt 4/17 at 8am with Dr. Josefa Ni  Loss of sensation fingers and toes for many years L eye unable to see x1 year, states this began suddenly  In person Spanish interpreter present along with patient's daughter and granddaughter Patient lives with daughter  PERTINENT  PMH / PSH: Type 2 diabetes, dyslipidemia, MDD  OBJECTIVE:   BP (!) 149/96   Pulse 87   Ht 5\' 3"  (1.6 m)   Wt 114 lb 6.4 oz (51.9 kg)   LMP 12/25/2023   SpO2 99%   BMI 20.27 kg/m   GEN: No acute distress, resting comfortably CV: Regular rate and rhythm, no murmurs Respiratory: CTAB, breathing comfortably on room air Extremities: Decreased sensation bilaterally from mid shin down to toes, no wounds or skin breakdown noted to bilateral feet however they were covered in dirt.. PT pulses 2+, unable to palpate DP pulses See foot exam on flowsheets  ASSESSMENT/PLAN:   Assessment & Plan Microscopic hematuria UA recollected today.  Patient denies any symptoms of hematuria.  She is still taking her antibiotics for pyelonephritis and is supposed to complete them on 4/17. Type 2 diabetes mellitus with hyperglycemia, unspecified whether long term insulin use (HCC) Uncontrolled diabetes, suspect type 2 diabetes but will obtain C-peptide today to differentiate as it looks like she has a history of HHS and DKA.  She has only been giving herself 3 units long-acting insulin since leaving the hospital, advised her to do 10 units of  Semglee daily and reviewed how to do this with the pen.  She is scheduled to see Dr. Koval tomorrow 4/15 for CGM placement Hemianopia, homonymous, left CTA head neck 12/27/23 with symmetric enhancement of ophthalmic arteries, ophthalmology evaluated patient in the hospital and recommended outpatient follow-up. I Called [facility] to confirm patient's appointment on 4/17 with Dr. Josefa Ni, patient provided with appointment date, time, address and stressed the importance of attending this appointment Vision testing abnormal today, 20/70 in right eye, 20/200 and left eye, 20/100 in both eyes.  Patient states she has not been able to see out of her left eye for 1 year Type 2 diabetes mellitus with diabetic neuropathy, without long-term current use of insulin (HCC) Patient does have severe diabetic neuropathy bilaterally.  Educated on importance of wearing shoes throughout the house, checking feet for wounds.  Will need monitoring with foot exams, consider gabapentin if becomes painful   Will be due for Pap when she is able to see her PCP.  Scheduled for blood pressure follow-up and depression follow-up on May 1, did not have time to address today  Sarahann Cumins, DO [facility]

## 2024-01-01 ENCOUNTER — Ambulatory Visit: Payer: Self-pay | Admitting: Pharmacist

## 2024-01-01 ENCOUNTER — Encounter: Payer: Self-pay | Admitting: Pharmacist

## 2024-01-01 VITALS — BP 147/79 | HR 91 | Wt 111.8 lb

## 2024-01-01 DIAGNOSIS — E119 Type 2 diabetes mellitus without complications: Secondary | ICD-10-CM

## 2024-01-01 DIAGNOSIS — Z794 Long term (current) use of insulin: Secondary | ICD-10-CM

## 2024-01-01 LAB — URINALYSIS, ROUTINE W REFLEX MICROSCOPIC
Bilirubin, UA: NEGATIVE
Ketones, UA: NEGATIVE
Leukocytes,UA: NEGATIVE
Nitrite, UA: NEGATIVE
Specific Gravity, UA: 1.028 (ref 1.005–1.030)
Urobilinogen, Ur: 0.2 mg/dL (ref 0.2–1.0)
pH, UA: 6 (ref 5.0–7.5)

## 2024-01-01 LAB — MICROSCOPIC EXAMINATION
Bacteria, UA: NONE SEEN
Casts: NONE SEEN /LPF
RBC, Urine: NONE SEEN /HPF (ref 0–2)
WBC, UA: NONE SEEN /HPF (ref 0–5)

## 2024-01-01 MED ORDER — INSULIN GLARGINE-YFGN 100 UNIT/ML ~~LOC~~ SOPN
8.0000 [IU] | PEN_INJECTOR | Freq: Every day | SUBCUTANEOUS | Status: DC
Start: 2024-01-01 — End: 2024-01-10

## 2024-01-01 NOTE — Assessment & Plan Note (Signed)
 Diabetes longstanding currently uncontrolled with most recent A1c at >15.5%. Patient is able to verbalize appropriate hypoglycemia management plan. Medication adherence appears to be nonadherent. Control is suboptimal due to patient relying on her daughters to prepare insulin dose for her prior to self-injecting.  -Applied Dexcom G7 sensor on patient's arm and gave patient Dexcom G7 receiver  -Restarted basal insulin Semglee (insulin glargine) at 8 units daily in the morning. Patient will continue to titrate 1 unit every day if fasting blood sugar > 150mg /dl until fasting blood sugars reach goal or next visit.  -Patient self-injected 8 units of insulin during appointment with counseling on proper technique.  -Continued metformin 500 mg daily with breakfast.  -Extensively discussed pathophysiology of diabetes, recommended lifestyle interventions, dietary effects on blood sugar control.  -Counseled on s/sx of and management of hypoglycemia.  -Next A1c anticipated July 2025.

## 2024-01-01 NOTE — Progress Notes (Signed)
    S:     Chief Complaint  Patient presents with   Medication Management    DM -Management   43 y.o. female who presents for diabetes evaluation, education, and management. Patient arrives in good spirits and presents without any assistance. Patient is accompanied by her daughter Odilia Bennett. The session was completed via interpreter Constantino Demark [phone] and daughter Odilia Bennett acting as interpreter.   Patient was referred and last seen by Primary Care Provider, Dr. Becki Bouton, on 12/31/2023.   PMH is significant for recent admission and diagnosis of Diabetes.   Current diabetes medications include: metformin 500 mg daily, insulin glargine (Semglee) - last dose 4/13 (2 days ago)  Patient denies adherence with medications, reports missing insulin glargine, last dose was 3 units on 4/13   Patient denies hypoglycemic events.  Patient denies nocturia (nighttime urination).  Patient reports neuropathy (nerve pain).   O:    Lab Results  Component Value Date   HGBA1C >15.5 (H) 12/26/2023   Vitals:   01/01/24 0916  BP: (!) 147/79  Pulse: 91  SpO2: 100%    Lipid Panel     Component Value Date/Time   CHOL 217 (H) 12/01/2008 2119   TRIG 194 (H) 09/08/2020 1704   HDL 66 12/01/2008 2119   CHOLHDL 3.3 Ratio 12/01/2008 2119   VLDL 32 12/01/2008 2119   LDLCALC 119 (H) 12/01/2008 2119    Clinical Atherosclerotic Cardiovascular Disease (ASCVD): No  The ASCVD Risk score (Arnett DK, et al., 2019) failed to calculate for the following reasons:   Cannot find a previous HDL lab   Cannot find a previous total cholesterol lab   A/P: Diabetes longstanding currently uncontrolled with most recent A1c at >15.5%. Patient is able to verbalize appropriate hypoglycemia management plan. Medication adherence appears to be nonadherent. Control is suboptimal due to patient relying on her daughters to prepare insulin dose for her prior to self-injecting.  -Applied Dexcom G7 sensor on patient's arm and gave patient  Dexcom G7 receiver  -Restarted basal insulin Semglee (insulin glargine) at 8 units daily in the morning. Patient will continue to titrate 1 unit every day if fasting blood sugar > 150mg /dl until fasting blood sugars reach goal or next visit.  -Patient self-injected 8 units of insulin during appointment with counseling on proper technique.  -Continued metformin 500 mg daily with breakfast.  -Extensively discussed pathophysiology of diabetes, recommended lifestyle interventions, dietary effects on blood sugar control.  -Counseled on s/sx of and management of hypoglycemia.  -Next A1c anticipated July 2025.   Written patient instructions provided. Patient verbalized understanding of treatment plan.  Total time in face to face counseling 103 minutes.    Follow-up:  Pharmacist 9 days (4/24) PCP clinic visit in 01/17/2024 - Dr. Fredrik Jensen Patient seen with Teofilo Fellers, PharmD Candidate and Jeanine Millers, PharmD Candidate.

## 2024-01-01 NOTE — Patient Instructions (Addendum)
 Fue un placer verte hoy!  Cambio de medicacin  Inicia Semglee (insulina glargina) con 8 unidades diarias y [place] la dosis en 1 unidad diaria cuando la glucosa en sangre en ayunas sea >150.  Contina con el resto de la medicacin.  Controla tu glucosa en sangre en casa y lleva un registro (glucmetro o papel) para llevar a tu prxima visita.  Contina con tu dieta y ejercicio. Intenta llevar una dieta rica en verduras, frutas y carnes magras (pollo, pavo, pescado). Intenta limitar el consumo de sal comiendo verduras frescas o congeladas (en lugar de enlatadas), enjuaga las verduras enlatadas antes de cocinarlas y no aadas sal a las comidas.  Recuerda lo siguiente sobre tu sensor DEXCOM G7: 1. El sensor dura 10 das. 2. El transmisor debe estar a menos de 6 metros del receptor. 3. No permita que el protector solar ni el repelente de insectos entren en contacto con el Dexcom G7. Estos productos para el cuidado de la piel pueden Psychologist, prison and probation services plstico del Dexcom G7. 4. El Dexcom G7 puede usarse en un arco Dispensing optician de metales. No debe exponerse a Diplomatic Services operational officer de tecnologa de imgenes avanzada (AIT) (tambin llamado escner de ondas milimtricas) ni a la mquina de rayos X para equipaje. En su lugar, solicite una inspeccin visual con varita o cacheo corporal completo. 5. Dosis de acetaminofn (Tylenol) superiores a 1 gramo cada 6 horas pueden causar lecturas altas falsas. 6. Conserve el kit del sensor entre 36 y 79 grados [address]. Puede refrigerarse dentro de este rango de temperatura.  Hipoglucemia o niveles bajos de azcar en sangre:  Los niveles bajos de azcar en sangre pueden presentarse rpidamente y convertirse en una emergencia si no se tratan de inmediato.  Aunque esto no debera ocurrir con frecuencia, puede ocurrir si se salta una comida o no come lo suficiente. Adems, si la insulina u otros medicamentos para la diabetes se dosifican demasiado, esto puede causar una bajada  de azcar en sangre.  Las seales de advertencia de un nivel bajo de azcar en sangre incluyen: 1. Sensacin de temblores o mareos 2. Sensacin de debilidad o cansancio 3. Hambre excesiva 4. Sensacin de ansiedad o Dentist 5. Sudoracin incluso sin hacer ejercicio  Qu hacer si tengo un nivel bajo de azcar en sangre? Siga la regla del 15 1. Controle su nivel de Production assistant, radio. Si es inferior a 70, contine con el paso 2. 2. Consuma 15 gramos de carbohidratos de accin rpida, que se encuentran en 3 o 4 tabletas de glucosa. Si no tiene a mano, puede probar con caramelos duros, 1 cucharada de azcar o miel, 113 ml de jugo de fruta o 177 ml de refresco normal. 3. Vuelva a controlar su nivel de azcar en 15 minutos. Si an est por debajo de 70, repita el procedimiento del paso 2. Si su nivel de azcar en sangre ha vuelto a subir, consuma un refrigerio o una comida ligera compuesta de carbohidratos complejos (por ejemplo, cereales integrales) y protenas para evitar que vuelva a bajar.  Cmo pedir parches de recubrimiento 1. Visite el siguiente sitio web cada 30 das para pedir nuevos parches de recubrimiento: [email or web address].[email or web address].co.[email or web address]  Informacin de servicio al cliente de Dexcom 1. Atencin al cliente (pedidos de Dexcom y preguntas generales) Nmero de telfono: [phone] Lunes a viernes, de 6:00 a 17:00 (hora del Pacfico) Sbado, de 8:00 a 16:00 (hora del Pacfico) *Contctenos si no recibe los parches de recubrimiento  2. Sitio web: https://www.dexcom.com/  Por favor, haga lo  siguiente:  1. Si tiene alguna pregunta o cree que ha ocurrido algo debido a este cambio, llmeme a m o a su mdico para informarnos. 2. Contine con los cambios de estilo de vida que comentamos durante nuestra [place]. La dieta y el ejercicio son fundamentales para mejorar sus niveles de Production assistant, radio.  It was a pleasure seeing you today!  Your goal blood sugar is 70-130  before eating and less than 180 after eating.  Medication Change  Start Semglee (insulin glargine) at 8 units daily, increase dose by 1 unit daily when morning fasting blood sugar is >150.  Continue all other medication the same.   Monitor blood sugars at home and keep a log (glucometer or piece of paper) to bring with you to your next visit.  Keep up the good work with diet and exercise. Aim for a diet full of vegetables, fruit and lean meats (chicken, [place], fish). Try to limit salt intake by eating fresh or frozen vegetables (instead of canned), rinse canned vegetables prior to cooking and do not add any additional salt to meals.    Please remember the following about your [facility] G7 Sensor 1. Sensor will last 10 days 2. Transmitter must be within 20 feet of receiver. 3. Do not allow sun screen or insect repellant to come into contact with Dexcom G7. These skin care products may lead for the plastic used in the Dexcom G7 to crack. 4. Dexcom G7 may be worn through a Industrial/product designer. It may not be exposed to an advanced Imaging Technology (AIT) body scanner (also called a millimeter wave scanner) or the baggage x-ray machine. Instead, ask for hand-wanding or full-body pat-down and visual inspection.  5. Doses of acetaminophen (Tylenol) >1 gram every 6 hours may cause false high readings. 6. Store sensor kit between 36 and 86 degrees Farenheit. Can be refrigerated within this temperature range.   Hypoglycemia or low blood sugar:   Low blood sugar can happen quickly and may become an emergency if not treated right away.   While this shouldn't happen often, it can be brought upon if you skip a meal or do not eat enough. Also, if your insulin or other diabetes medications are dosed too high, this can cause your blood sugar to go to low.   Warning signs of low blood sugar include: Feeling shaky or dizzy Feeling weak or tired  Excessive hunger Feeling anxious or upset   Sweating even when you aren't exercising  What to do if I experience low blood sugar? Follow the Rule of 15 Check your blood sugar. If lower than 70, proceed to step 2.  Treat with 15 grams of fast acting carbs which is found in 3-4 glucose tablets. If none are available you can try hard candy, 1 tablespoon of sugar or honey,4 ounces of fruit juice, or 6 ounces of REGULAR soda.  Re-check your sugar in 15 minutes. If it is still below 70, do what you did in step 2 again. If your blood sugar has come back up, go ahead and eat a snack or small meal made up of complex carbs (ex. Whole grains) and protein at this time to avoid recurrence of low blood sugar.  Ordering Overlay Patches 1. Go to the following website every 30 days to order new overlay patches:  [email or web address]  [employer] Customer Service Information Customer Sales Support (dexcom orders and general customer questions) Phone number: [phone] Monday - Friday  6 AM - 5 PM PST Saturday  8 AM - 4 PM PST  *Contact if you do not receive overlay patches  2. Website: [email or web address].dexcom.com/  Please do the following:  If you have any questions or if you believe something has occurred because of this change, call me or your doctor to let one of us  know.  Continue making the lifestyle changes we've discussed together during our visit. Diet and exercise play a significant role in improving your blood sugars.

## 2024-01-02 ENCOUNTER — Encounter: Payer: Self-pay | Admitting: Family Medicine

## 2024-01-02 LAB — CYCLIC CITRUL PEPTIDE ANTIBODY, IGG/IGA: Cyclic Citrullin Peptide Ab: 8 U (ref 0–19)

## 2024-01-02 NOTE — Progress Notes (Signed)
 Reviewed and agree with Dr Macky Lower plan.

## 2024-01-03 ENCOUNTER — Ambulatory Visit: Payer: Self-pay | Admitting: Student

## 2024-01-10 ENCOUNTER — Ambulatory Visit: Payer: Self-pay | Admitting: Pharmacist

## 2024-01-10 ENCOUNTER — Encounter: Payer: Self-pay | Admitting: Pharmacist

## 2024-01-10 VITALS — BP 87/59 | HR 87 | Wt 111.4 lb

## 2024-01-10 DIAGNOSIS — E119 Type 2 diabetes mellitus without complications: Secondary | ICD-10-CM

## 2024-01-10 MED ORDER — INSULIN GLARGINE-YFGN 100 UNIT/ML ~~LOC~~ SOPN: 18.0000 [IU] | PEN_INJECTOR | Freq: Every day | SUBCUTANEOUS | Status: DC

## 2024-01-10 MED ORDER — DEXCOM G7 SENSOR MISC: 11 refills | Status: DC

## 2024-01-10 MED ORDER — BD PEN NEEDLE NANO 2ND GEN 32G X 4 MM MISC
11 refills | Status: DC
Start: 2024-01-10 — End: 2024-03-14

## 2024-01-10 NOTE — Assessment & Plan Note (Signed)
 Diabetes longstanding, currently uncontrolled with most recent A1c of >15.5%. Slightly, improved glucose with use of daily insulin  injection. Patient is able to verbalize appropriate hypoglycemia management plan. Medication adherence appears to be good. Control is suboptimal due to current inadequate insulin  glargine dosage. -Increased dose of basal insulin  Semglee  (insulin  glargine)from 12 to 18 units daily in the morning. Patient instructed to continue to titrate 1 unit every 2 days if fasting blood sugar > 100mg /dl until fasting blood sugars reach goal or next visit.  -Continued metformin  500 mg daily - no increase due to nausea, loose stools  -Walked daughter through steps to replace Dexcom G7 sensor - demonstrated appropriate technique in office with new sensor placement/connection.  -Prescription sent to pharmacy for Dexcom G7 sensors and insulin  pen needles. -Extensively discussed pathophysiology of diabetes, recommended lifestyle interventions, dietary effects on blood sugar control.  -Counseled on s/sx of and management of hypoglycemia.

## 2024-01-10 NOTE — Progress Notes (Signed)
 S:     Chief Complaint  Patient presents with   Medication Management    Diabetes - insulin  adjustment & CGM   43 y.o. female who presents for diabetes evaluation, education, and management. Patient arrives in good spirits and presents without any assistance. Patient is accompanied by daughter Odilia Bennett. Appointment completed via daughter Odilia Bennett acting as interpreter.  Patient was referred and last seen by Primary Care Provider, Dr. Becki Bouton, on 12/31/2023.   PMH is significant for T2DM, dyslipidemia, HHS (2025), pyelonephritis (2025), DKA (2022), and vision loss.  Patient reports Diabetes was diagnosed in 2007.   Current diabetes medications include: metformin  500 mg, insulin  glargine 12 units once daily  - Patient stated that after eating meals she feels an urgent need to go to the bathroom - Patient also noted that she experiences occasional constipation that causes nausea  Patient reports adherence to taking all medications as prescribed.   Patient denies hypoglycemic events.  Patient denies nocturia (nighttime urination).  Patient reports neuropathy (nerve pain) - hands, feet & knees Patient reports visual changes. Patient denies self foot exams.   Patient reported dietary habits: Eats 2-3 meals/day Breakfast:  Lunch: salad - with fruit, chicken, & vinaigrette Dinner: salad - with fruit, chicken, & vinaigrette Snacks: cookies, yogurt Drinks: diet coke, water   O:   Dexcom G7 CGM Download today 01/10/2024 % Time CGM is active: 99.8% Average Glucose: 353 mg/dL Glucose Management Indicator: 11.7%  Glucose Variability: 14.1% (goal <36%) Time in Goal:  - Time in range 70-180: 0% - Time above range: 100% - Time below range: 0%   Lab Results  Component Value Date   HGBA1C >15.5 (H) 12/26/2023   Vitals:   01/10/24 0939  BP: (!) 87/59  Pulse: 87  SpO2: 100%    Lipid Panel     Component Value Date/Time   CHOL 217 (H) 12/01/2008 2119   TRIG 194 (H)  09/08/2020 1704   HDL 66 12/01/2008 2119   CHOLHDL 3.3 Ratio 12/01/2008 2119   VLDL 32 12/01/2008 2119   LDLCALC 119 (H) 12/01/2008 2119    Clinical Atherosclerotic Cardiovascular Disease (ASCVD): No  The ASCVD Risk score (Arnett DK, et al., 2019) failed to calculate for the following reasons:   The valid systolic blood pressure range is 90 to 200 mmHg   Cannot find a previous HDL lab   Cannot find a previous total cholesterol lab   A/P: Diabetes longstanding, currently uncontrolled with most recent A1c of >15.5%. Slightly, improved glucose with use of daily insulin  injection. Patient is able to verbalize appropriate hypoglycemia management plan. Medication adherence appears to be good. Control is suboptimal due to current inadequate insulin  glargine dosage. -Increased dose of basal insulin  Semglee  (insulin  glargine) from 12 to 18 units daily in the morning. Patient instructed to continue to titrate 1 unit every 2 days if fasting blood sugar > 100mg /dl until fasting blood sugars reach goal or next visit.  -Continued metformin  500 mg daily - no increase due to nausea, loose stools  -Walked daughter through steps to replace Dexcom G7 sensor - demonstrated appropriate technique in office with new sensor placement/connection.  -Prescription sent to pharmacy for Dexcom G7 sensors and insulin  pen needles. -Extensively discussed pathophysiology of diabetes, recommended lifestyle interventions, dietary effects on blood sugar control.  -Counseled on s/sx of and management of hypoglycemia.  -Next A1c anticipated Jun 2025.   Written patient instructions provided. Patient verbalized understanding of treatment plan.  Total time in face to face  counseling 56 minutes.    Follow-up:  Pharmacist in two weeks PCP clinic visit in one week  Patient seen with Jeanine Millers, PharmD Candidate.

## 2024-01-10 NOTE — Progress Notes (Signed)
 Reviewed and agree with Dr Macky Lower plan.

## 2024-01-10 NOTE — Patient Instructions (Addendum)
  Qu bueno verte hoy!  Tu nivel de Production assistant, radio ideal es de 80 a 130 antes de comer y menos de 180 despus de comer.  Cambios en la medicacin: Aumenta la dosis de Semglee  (insulina glargina) a 18 unidades una vez al [employer].  Contina aumentando 1 unidad al [employer] que tu nivel de azcar en sangre se mantenga constante por debajo de 150.  Contina con el resto de tu medicacin.  Sigue con la buena alimentacin y el ejercicio. Intenta llevar una dieta rica en verduras, frutas y carnes magras (pollo, pavo, pescado). Intenta limitar el consumo de sal comiendo verduras frescas o congeladas (en lugar de enlatadas), enjuaga las verduras enlatadas antes de cocinarlas y no aadas sal a las comidas.   It was nice to see you today!  Your goal blood sugar is 80-130 before eating and less than 180 after eating.  Medication Changes: Increase Semglee  (insulin  glargine) to 18 units once daily.  Continue to increase by 1 unit daily until blood sugars are consistently below 150.   Continue all other medication the same.   Keep up the good work with diet and exercise. Aim for a diet full of vegetables, fruit and lean meats (chicken, [place], fish). Try to limit salt intake by eating fresh or frozen vegetables (instead of canned), rinse canned vegetables prior to cooking and do not add any additional salt to meals.

## 2024-01-17 ENCOUNTER — Ambulatory Visit (INDEPENDENT_AMBULATORY_CARE_PROVIDER_SITE_OTHER): Payer: Self-pay | Admitting: Family Medicine

## 2024-01-17 VITALS — BP 117/81 | HR 83 | Ht 63.0 in | Wt 114.4 lb

## 2024-01-17 DIAGNOSIS — Z1159 Encounter for screening for other viral diseases: Secondary | ICD-10-CM

## 2024-01-17 DIAGNOSIS — E785 Hyperlipidemia, unspecified: Secondary | ICD-10-CM

## 2024-01-17 DIAGNOSIS — D649 Anemia, unspecified: Secondary | ICD-10-CM

## 2024-01-17 DIAGNOSIS — H53462 Homonymous bilateral field defects, left side: Secondary | ICD-10-CM

## 2024-01-17 DIAGNOSIS — Z794 Long term (current) use of insulin: Secondary | ICD-10-CM

## 2024-01-17 DIAGNOSIS — Z0000 Encounter for general adult medical examination without abnormal findings: Secondary | ICD-10-CM | POA: Insufficient documentation

## 2024-01-17 DIAGNOSIS — E1165 Type 2 diabetes mellitus with hyperglycemia: Secondary | ICD-10-CM

## 2024-01-17 DIAGNOSIS — F332 Major depressive disorder, recurrent severe without psychotic features: Secondary | ICD-10-CM

## 2024-01-17 MED ORDER — CITALOPRAM HYDROBROMIDE 20 MG PO TABS
20.0000 mg | ORAL_TABLET | Freq: Every day | ORAL | 0 refills | Status: DC
Start: 2024-01-17 — End: 2024-03-14

## 2024-01-17 NOTE — Assessment & Plan Note (Signed)
 Hepatitis C testing collected today.

## 2024-01-17 NOTE — Progress Notes (Signed)
    SUBJECTIVE:   CHIEF COMPLAINT / HPI:   Fatigue, depression Has been going on for a while since before the hospital. No history of thyroid dysfunction. She may have had some vitamin D deficiency. She does have some concerns about depression. She feels useless sometimes because she cannot do things. She does not enjoy going out like she used to. She has problems with going to sleep. No thoughts of hurting herself. She would like to go out and spend more time with [place] in the park.  Type 2 DM Has been taking metformin  500 mg daily and insulin  18 units in the evenings. She has a CGM and has been using it. She was instructed to increase her insulin  to 18 units daily. They did not increase every 2 days by 1 unit because her sugar has been 120-140 sometimes in the evening. She notes that the values have been better since this increase. On her CGM, the values appear to be consistently over 200 the majority of the day. She needs more dexcom sensors and insulin  pens to [employer] on [address].  Anemia Has a history of this.  She has menstrual periods with heavy flow.  Periods are regular, every 3-6 days per cycle, sometimes 2 periods per cycle.  PERTINENT  PMH / PSH: hemianopia left  OBJECTIVE:   BP 117/81   Pulse 83   Ht 5\' 3"  (1.6 m)   Wt 114 lb 6.4 oz (51.9 kg)   LMP 12/25/2023   SpO2 99%   BMI 20.27 kg/m   General: Alert and oriented, in NAD Skin: Warm, dry, and intact; hyperpigmented lesions in a linear pattern along the left anterior shin of lower leg HEENT: NCAT, EOM grossly normal, midline nasal septum, poor dentition Cardiac: RRR, no m/r/g appreciated Respiratory: CTAB, breathing and speaking comfortably on RA Abdominal: Soft, nontender, nondistended, normoactive bowel sounds Extremities: Moves all extremities grossly equally Neurological: No gross focal deficit Psychiatric: Appropriate mood and affect  ASSESSMENT/PLAN:   Assessment &  Plan Type 2 diabetes mellitus with hyperglycemia, with long-term current use of insulin  (HCC) Advised to start taking insulin  18 units with metformin  500 mg in the mornings.  If blood glucose on her sensor is greater than 100 in the mornings before she eats breakfast, advised to go up 1 unit that day.  Discussed activating MyChart and sending me her dose of insulin  that she has gotten up to in 1 week.  Otherwise follow-up in 1 month for A1c recheck and urine microalbumin/creatinine ratio. Anemia, unspecified type Has history of normocytic anemia, likely in the setting of chronic blood loss for menstrual periods.  Will update CBC and iron testing today.  Consider further discussion surrounding management of menses with PCP. Hyperlipidemia, unspecified hyperlipidemia type Not on a statin currently.  Repeat lipid panel at next visit and likely initiate statin. 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  Patient would benefit from ophthalmologic follow-up for this and retinopathy screening for diabetes; referral to ophthalmology placed. Healthcare maintenance Hepatitis C testing collected today.   Genetta Kenning, MD [facility]

## 2024-01-17 NOTE — Patient Instructions (Addendum)
 Take your insulin  tonight.  You can take it again in the morning before eating breakfast.  Then try to take it every morning instead of every evening.    Go up by 1 unit of insulin  every single morning if your glucose before breakfast is greater than 100.  Call me back in 1 week or set up MyChart and make an account and let me know what dose of insulin  you are out at that time.  Come back in 1 month to recheck your blood sugar levels.  I have sent in Celexa  for your depression.  We also obtained labs today but could be contributing to your fatigue.  Your blood pressure looks good today.

## 2024-01-17 NOTE — Assessment & Plan Note (Addendum)
 Not on a statin currently.  Repeat lipid panel at next visit and likely initiate statin.

## 2024-01-17 NOTE — Assessment & Plan Note (Addendum)
 Appears chronic per chart review in the hospital.  Patient would benefit from ophthalmologic follow-up for this and retinopathy screening for diabetes; referral to ophthalmology placed.

## 2024-01-17 NOTE — Assessment & Plan Note (Addendum)
 Advised to start taking insulin  18 units with metformin  500 mg in the mornings.  If blood glucose on her sensor is greater than 100 in the mornings before she eats breakfast, advised to go up 1 unit that day.  Discussed activating MyChart and sending me her dose of insulin  that she has gotten up to in 1 week.  Otherwise follow-up in 1 month for A1c recheck and urine microalbumin/creatinine ratio.

## 2024-01-17 NOTE — Assessment & Plan Note (Addendum)
 Currently worsening given recent health events.  Feel this is likely driver of fatigue along with anemia as below, though will also collect vitamin D to rule out contributions.  She is reassuringly without SI.  Send in Celexa  20 mg daily given this helped in the past.  Will mail therapy resources to her home.  Follow-up at next visit in 1 month.

## 2024-01-21 ENCOUNTER — Other Ambulatory Visit (HOSPITAL_COMMUNITY): Payer: Self-pay

## 2024-01-23 ENCOUNTER — Other Ambulatory Visit: Payer: Self-pay | Admitting: Family Medicine

## 2024-01-23 ENCOUNTER — Other Ambulatory Visit (HOSPITAL_COMMUNITY): Payer: Self-pay

## 2024-01-23 ENCOUNTER — Telehealth: Payer: Self-pay | Admitting: Family Medicine

## 2024-01-23 LAB — CBC WITH DIFFERENTIAL/PLATELET
Basophils Absolute: 0.1 10*3/uL (ref 0.0–0.2)
Basos: 1 %
EOS (ABSOLUTE): 0.3 10*3/uL (ref 0.0–0.4)
Eos: 4 %
Hematocrit: 33.9 % — ABNORMAL LOW (ref 34.0–46.6)
Hemoglobin: 10.4 g/dL — ABNORMAL LOW (ref 11.1–15.9)
Immature Grans (Abs): 0 10*3/uL (ref 0.0–0.1)
Immature Granulocytes: 0 %
Lymphocytes Absolute: 2.6 10*3/uL (ref 0.7–3.1)
Lymphs: 37 %
MCH: 28.2 pg (ref 26.6–33.0)
MCHC: 30.7 g/dL — ABNORMAL LOW (ref 31.5–35.7)
MCV: 92 fL (ref 79–97)
Monocytes Absolute: 0.6 10*3/uL (ref 0.1–0.9)
Monocytes: 8 %
Neutrophils Absolute: 3.5 10*3/uL (ref 1.4–7.0)
Neutrophils: 50 %
Platelets: 452 10*3/uL — ABNORMAL HIGH (ref 150–450)
RBC: 3.69 x10E6/uL — ABNORMAL LOW (ref 3.77–5.28)
RDW: 11.8 % (ref 11.7–15.4)
WBC: 7 10*3/uL (ref 3.4–10.8)

## 2024-01-23 LAB — IRON,TIBC AND FERRITIN PANEL
Ferritin: 22 ng/mL (ref 15–150)
Iron Saturation: 25 % (ref 15–55)
Iron: 84 ug/dL (ref 27–159)
Total Iron Binding Capacity: 342 ug/dL (ref 250–450)
UIBC: 258 ug/dL (ref 131–425)

## 2024-01-23 LAB — HCV AB W REFLEX TO QUANT PCR: HCV Ab: NONREACTIVE

## 2024-01-23 LAB — VITAMIN D 25 HYDROXY (VIT D DEFICIENCY, FRACTURES): Vit D, 25-Hydroxy: 5.3 ng/mL — ABNORMAL LOW (ref 30.0–100.0)

## 2024-01-23 LAB — HCV INTERPRETATION

## 2024-01-23 MED ORDER — VITAMIN D (ERGOCALCIFEROL) 1.25 MG (50000 UNIT) PO CAPS: 50000.0000 [IU] | ORAL_CAPSULE | ORAL | 0 refills | Status: DC

## 2024-01-23 NOTE — Telephone Encounter (Signed)
 For patient's fatigue, likely that depression is playing a major role in addition to her chronic health conditions. She also has vitamin D deficiency for which I will send in vitamin D supplementation weekly for 8 weeks. After that, would recommend daily dosing and rechecking levels in 3-4 months. Unfortunately was not able to reach patient, spouse, or daughter by phone. LVM on daughter's phone to call back at earliest convenience. RN TEAM: please relay recommendations to patient when she calls back.

## 2024-01-24 ENCOUNTER — Other Ambulatory Visit (HOSPITAL_COMMUNITY): Payer: Self-pay

## 2024-01-24 ENCOUNTER — Ambulatory Visit: Payer: Self-pay | Admitting: Pharmacist

## 2024-02-21 ENCOUNTER — Ambulatory Visit (HOSPITAL_BASED_OUTPATIENT_CLINIC_OR_DEPARTMENT_OTHER): Payer: Self-pay | Admitting: Family Medicine

## 2024-02-28 ENCOUNTER — Ambulatory Visit: Payer: Self-pay | Admitting: Family Medicine

## 2024-02-28 NOTE — Progress Notes (Deleted)
    SUBJECTIVE:   CHIEF COMPLAINT / HPI:   ***  Type 2 diabetes Current regimen is: 18 units long-acting daily***, metformin  500 mg daily, has CGM CGM readings have been: Morning***, before meals*** Needs MCR today Needs lipid panel and likely will need to start statin Needs ophthalmology Needs podiatry  Vitamin D  deficiency - Should be taking vitamin D  supplementation weekly x 8 weeks - Will then need levels checked in 3 to 4 months (around August or September)  Depression  Anemia  PERTINENT  PMH / PSH: ***  OBJECTIVE:   There were no vitals taken for this visit.  ***  ASSESSMENT/PLAN:   Assessment & Plan Type 2 diabetes mellitus with hyperglycemia, with long-term current use of insulin  (HCC)  Healthcare maintenance      Naida Austria, MD [facility]

## 2024-03-13 ENCOUNTER — Emergency Department (HOSPITAL_COMMUNITY): Payer: MEDICAID

## 2024-03-13 ENCOUNTER — Inpatient Hospital Stay (HOSPITAL_COMMUNITY)
Admission: EM | Admit: 2024-03-13 | Discharge: 2024-03-15 | DRG: 639 | Disposition: A | Discharge status: Home / Self Care | Payer: Self-pay | Attending: Family Medicine | Admitting: Family Medicine

## 2024-03-13 ENCOUNTER — Encounter (HOSPITAL_COMMUNITY): Payer: Self-pay

## 2024-03-13 ENCOUNTER — Other Ambulatory Visit: Payer: Self-pay

## 2024-03-13 ENCOUNTER — Emergency Department (HOSPITAL_COMMUNITY): Payer: Self-pay

## 2024-03-13 DIAGNOSIS — Z9151 Personal history of suicidal behavior: Secondary | ICD-10-CM

## 2024-03-13 DIAGNOSIS — T383X6A Underdosing of insulin and oral hypoglycemic [antidiabetic] drugs, initial encounter: Secondary | ICD-10-CM | POA: Diagnosis present

## 2024-03-13 DIAGNOSIS — F4320 Adjustment disorder, unspecified: Secondary | ICD-10-CM | POA: Diagnosis present

## 2024-03-13 DIAGNOSIS — Z794 Long term (current) use of insulin: Secondary | ICD-10-CM

## 2024-03-13 DIAGNOSIS — Z79899 Other long term (current) drug therapy: Secondary | ICD-10-CM

## 2024-03-13 DIAGNOSIS — E1100 Type 2 diabetes mellitus with hyperosmolarity without nonketotic hyperglycemic-hyperosmolar coma (NKHHC): Secondary | ICD-10-CM | POA: Diagnosis present

## 2024-03-13 DIAGNOSIS — R031 Nonspecific low blood-pressure reading: Secondary | ICD-10-CM | POA: Diagnosis present

## 2024-03-13 DIAGNOSIS — Z789 Other specified health status: Secondary | ICD-10-CM

## 2024-03-13 DIAGNOSIS — E1165 Type 2 diabetes mellitus with hyperglycemia: Secondary | ICD-10-CM

## 2024-03-13 DIAGNOSIS — Z833 Family history of diabetes mellitus: Secondary | ICD-10-CM

## 2024-03-13 DIAGNOSIS — Z91128 Patient's intentional underdosing of medication regimen for other reason: Secondary | ICD-10-CM

## 2024-03-13 DIAGNOSIS — R1013 Epigastric pain: Secondary | ICD-10-CM | POA: Diagnosis present

## 2024-03-13 DIAGNOSIS — E1110 Type 2 diabetes mellitus with ketoacidosis without coma: Principal | ICD-10-CM | POA: Diagnosis present

## 2024-03-13 DIAGNOSIS — Z7984 Long term (current) use of oral hypoglycemic drugs: Secondary | ICD-10-CM

## 2024-03-13 DIAGNOSIS — R110 Nausea: Secondary | ICD-10-CM | POA: Insufficient documentation

## 2024-03-13 DIAGNOSIS — H53462 Homonymous bilateral field defects, left side: Secondary | ICD-10-CM | POA: Diagnosis present

## 2024-03-13 DIAGNOSIS — G8929 Other chronic pain: Secondary | ICD-10-CM | POA: Diagnosis present

## 2024-03-13 DIAGNOSIS — Z5971 Insufficient health insurance coverage: Secondary | ICD-10-CM

## 2024-03-13 LAB — I-STAT VENOUS BLOOD GAS, ED
Acid-base deficit: 2 mmol/L (ref 0.0–2.0)
Bicarbonate: 23.1 mmol/L (ref 20.0–28.0)
Calcium, Ion: 1.11 mmol/L — ABNORMAL LOW (ref 1.15–1.40)
HCT: 31 % — ABNORMAL LOW (ref 36.0–46.0)
Hemoglobin: 10.5 g/dL — ABNORMAL LOW (ref 12.0–15.0)
O2 Saturation: 96 %
Potassium: 4 mmol/L (ref 3.5–5.1)
Sodium: 129 mmol/L — ABNORMAL LOW (ref 135–145)
TCO2: 24 mmol/L (ref 22–32)
pCO2, Ven: 37.9 mmHg — ABNORMAL LOW (ref 44–60)
pH, Ven: 7.392 (ref 7.25–7.43)
pO2, Ven: 82 mmHg — ABNORMAL HIGH (ref 32–45)

## 2024-03-13 LAB — CBC WITH DIFFERENTIAL/PLATELET
Abs Immature Granulocytes: 0.06 10*3/uL (ref 0.00–0.07)
Basophils Absolute: 0.1 10*3/uL (ref 0.0–0.1)
Basophils Relative: 0 %
Eosinophils Absolute: 0 10*3/uL (ref 0.0–0.5)
Eosinophils Relative: 0 %
HCT: 31.3 % — ABNORMAL LOW (ref 36.0–46.0)
Hemoglobin: 10 g/dL — ABNORMAL LOW (ref 12.0–15.0)
Immature Granulocytes: 1 %
Lymphocytes Relative: 12 %
Lymphs Abs: 1.5 10*3/uL (ref 0.7–4.0)
MCH: 26.9 pg (ref 26.0–34.0)
MCHC: 31.9 g/dL (ref 30.0–36.0)
MCV: 84.1 fL (ref 80.0–100.0)
Monocytes Absolute: 0.8 10*3/uL (ref 0.1–1.0)
Monocytes Relative: 7 %
Neutro Abs: 10.2 10*3/uL — ABNORMAL HIGH (ref 1.7–7.7)
Neutrophils Relative %: 80 %
Platelets: 472 10*3/uL — ABNORMAL HIGH (ref 150–400)
RBC: 3.72 MIL/uL — ABNORMAL LOW (ref 3.87–5.11)
RDW: 12.9 % (ref 11.5–15.5)
WBC: 12.6 10*3/uL — ABNORMAL HIGH (ref 4.0–10.5)
nRBC: 0 % (ref 0.0–0.2)

## 2024-03-13 LAB — LIPASE, BLOOD: Lipase: 33 U/L (ref 11–51)

## 2024-03-13 LAB — I-STAT CHEM 8, ED
BUN: 16 mg/dL (ref 6–20)
Calcium, Ion: 1.12 mmol/L — ABNORMAL LOW (ref 1.15–1.40)
Chloride: 91 mmol/L — ABNORMAL LOW (ref 98–111)
Creatinine, Ser: 0.8 mg/dL (ref 0.44–1.00)
Glucose, Bld: 549 mg/dL (ref 70–99)
HCT: 32 % — ABNORMAL LOW (ref 36.0–46.0)
Hemoglobin: 10.9 g/dL — ABNORMAL LOW (ref 12.0–15.0)
Potassium: 4 mmol/L (ref 3.5–5.1)
Sodium: 129 mmol/L — ABNORMAL LOW (ref 135–145)
TCO2: 21 mmol/L — ABNORMAL LOW (ref 22–32)

## 2024-03-13 LAB — COMPREHENSIVE METABOLIC PANEL WITH GFR
ALT: 8 U/L (ref 0–44)
AST: 11 U/L — ABNORMAL LOW (ref 15–41)
Albumin: 2.5 g/dL — ABNORMAL LOW (ref 3.5–5.0)
Alkaline Phosphatase: 60 U/L (ref 38–126)
Anion gap: 20 — ABNORMAL HIGH (ref 5–15)
BUN: 16 mg/dL (ref 6–20)
CO2: 21 mmol/L — ABNORMAL LOW (ref 22–32)
Calcium: 8.8 mg/dL — ABNORMAL LOW (ref 8.9–10.3)
Chloride: 90 mmol/L — ABNORMAL LOW (ref 98–111)
Creatinine, Ser: 1.07 mg/dL — ABNORMAL HIGH (ref 0.44–1.00)
GFR, Estimated: >60 mL/min (ref >60)
Glucose, Bld: 508 mg/dL (ref 70–99)
Potassium: 4.1 mmol/L (ref 3.5–5.1)
Sodium: 131 mmol/L — ABNORMAL LOW (ref 135–145)
Total Bilirubin: 1.1 mg/dL (ref 0.0–1.2)
Total Protein: 6.9 g/dL (ref 6.5–8.1)

## 2024-03-13 LAB — CBG MONITORING, ED: Glucose-Capillary: 524 mg/dL (ref 70–99)

## 2024-03-13 LAB — BETA-HYDROXYBUTYRIC ACID: Beta-Hydroxybutyric Acid: 4.9 mmol/L — ABNORMAL HIGH (ref 0.05–0.27)

## 2024-03-13 LAB — I-STAT CG4 LACTIC ACID, ED: Lactic Acid, Venous: 1.3 mmol/L (ref 0.5–1.9)

## 2024-03-13 LAB — HCG, SERUM, QUALITATIVE: Preg, Serum: NEGATIVE

## 2024-03-13 LAB — MAGNESIUM: Magnesium: 1.8 mg/dL (ref 1.7–2.4)

## 2024-03-13 MED ORDER — LACTATED RINGERS IV SOLN: INTRAVENOUS | Status: DC

## 2024-03-13 MED ORDER — ENOXAPARIN SODIUM 40 MG/0.4ML IJ SOSY
40.0000 mg | PREFILLED_SYRINGE | Freq: Every day | INTRAMUSCULAR | Status: DC
Start: 2024-03-14 — End: 2024-03-15
  Administered 2024-03-14 – 2024-03-15 (×2): 40 mg via SUBCUTANEOUS
  Filled 2024-03-13 (×2): qty 0.4

## 2024-03-13 MED ORDER — LACTATED RINGERS IV SOLN: INTRAVENOUS | Status: AC

## 2024-03-13 MED ORDER — POTASSIUM CHLORIDE 10 MEQ/100ML IV SOLN
10.0000 meq | INTRAVENOUS | Status: AC
  Administered 2024-03-14 (×2): 10 meq via INTRAVENOUS
  Filled 2024-03-13 (×2): qty 100

## 2024-03-13 MED ORDER — ACETAMINOPHEN 650 MG RE SUPP
650.0000 mg | Freq: Four times a day (QID) | RECTAL | Status: DC | PRN
Start: 2024-03-13 — End: 2024-03-15

## 2024-03-13 MED ORDER — LACTATED RINGERS IV BOLUS
1000.0000 mL | Freq: Once | INTRAVENOUS | Status: AC
  Administered 2024-03-14: 1000 mL via INTRAVENOUS

## 2024-03-13 MED ORDER — LACTATED RINGERS IV BOLUS
1000.0000 mL | Freq: Once | INTRAVENOUS | Status: AC
  Administered 2024-03-13: 1000 mL via INTRAVENOUS

## 2024-03-13 MED ORDER — ONDANSETRON 4 MG PO TBDP
4.0000 mg | ORAL_TABLET | Freq: Three times a day (TID) | ORAL | Status: DC | PRN
  Administered 2024-03-14: 4 mg via ORAL
  Filled 2024-03-13: qty 1

## 2024-03-13 MED ORDER — IOHEXOL 350 MG/ML SOLN
50.0000 mL | Freq: Once | INTRAVENOUS | Status: AC | PRN
  Administered 2024-03-13: 50 mL via INTRAVENOUS

## 2024-03-13 MED ORDER — CITALOPRAM HYDROBROMIDE 20 MG PO TABS
20.0000 mg | ORAL_TABLET | Freq: Every day | ORAL | Status: DC
  Administered 2024-03-14 – 2024-03-15 (×2): 20 mg via ORAL
  Filled 2024-03-13: qty 1
  Filled 2024-03-13: qty 2

## 2024-03-13 MED ORDER — ACETAMINOPHEN 325 MG PO TABS
650.0000 mg | ORAL_TABLET | Freq: Four times a day (QID) | ORAL | Status: DC | PRN
Start: 2024-03-13 — End: 2024-03-15
  Administered 2024-03-14: 650 mg via ORAL
  Filled 2024-03-13: qty 2

## 2024-03-13 MED ORDER — DEXTROSE 50 % IV SOLN: 0.0000 mL | INTRAVENOUS | Status: DC | PRN

## 2024-03-13 MED ORDER — DEXTROSE IN LACTATED RINGERS 5 % IV SOLN: INTRAVENOUS | Status: DC

## 2024-03-13 MED ORDER — INSULIN REGULAR(HUMAN) IN NACL 100-0.9 UT/100ML-% IV SOLN
INTRAVENOUS | Status: AC
  Administered 2024-03-14: 6 [IU]/h via INTRAVENOUS
  Filled 2024-03-13: qty 100

## 2024-03-13 NOTE — Assessment & Plan Note (Deleted)
--  IV insulin  per Endotool --IVF --LR 125mg  while receiving IV insulin  --Transition to D5LR infusion once glucose <250 --CBG monitoring per EndoTool --Zofran  ODT 4mg  q8h PRN --NPO -- Electrolytes monitoring: BMP q4h, BHB q4h

## 2024-03-13 NOTE — H&P (Incomplete)
 Hospital Admission History and Physical Service Pager: [phone]  Patient name: Shelby Brown Medical record number: [id number] Date of Birth: 1981/08/11 Age: 43 y.o. Gender: female  Primary Care Provider: Adele Song, MD Consultants: None Code Status: Full Preferred Emergency Contact:  Contact Information     Name Relation Home Work Mobile   [name],[name] Spouse [phone]  [phone]   R,[name] Daughter [phone]     [name] Knee Daughter   [phone]      Other Contacts   None on File      Chief Complaint: Abdominal pain  Assessment and Plan: Shelby Brown is a 43 y.o. female with PMH poorly controlled type II DM presenting with abdominal pain, nausea, vomiting, and hyperglycemia to the 500s in the setting of not taking insulin  for 2 weeks.  Given history and lab findings, presentation most consistent with a mixed DKA/HHS picture.  Other diagnoses considered include pancreatitis (lipase WNL), cholecystitis (normal gallbladder on CTAP), SBO (patient endorses daily BMs), and pyelonephritis (afebrile, no evidence of this on CTAP). Patient is hemodynamically stable with normal mentation and has been started on IV insulin  via Endotool.  Will follow BMPs every 4 hours and transition to subcutaneous insulin  once anion gap has closed x2 and patient able to tolerate p.o. intake.  Will also consult case management to assist patient with access to insulin  and other medications.  Assessment & Plan DKA (diabetic ketoacidosis) (HCC) Mixed picture of DKA and HHS.  Glucose >500 with AG 20, pH WNL, decreased pCO2 (37.9), elevated BHB 4.9.  Initial K 4.0. S/p 1L LR bolus in ED. Presentation 2/2 patient running out of insulin .  As of most recent [facility] note, patient was prescribed 18U LAI and 500 mg metformin  daily. --IV insulin  per Endotool -- Fluids:  --Additional 1L LR bolus followed by 125mg /hr LR while receiving IV insulin  -- Will transition to D5LR  infusion once glucose <250 --10 mEq IV Kcl x2 ordered, will continue to replete potassium based on subsequent BMPs --CBG monitoring per EndoTool --BMP every 4 hours --Zofran  ODT 4mg  q8h PRN --NPO while on Endotool --Strict I/Os --AM CBC, BMP, HA1C --Follow-up UA -- Consult to diabetes coordinator -- Consult to [facility] for medication assistance  Chronic health problem Adjustment disorder: home celexa  20mg  daily     FEN/GI: NPO while on EndoTool VTE Prophylaxis: Lovenox  daily  Disposition: Progressive  History of Present Illness:  Shelby Brown is a 43 y.o. female presenting with abdominal pain, nausea and vomiting found to be hyperglycemic to the 500s.  Vomiting and abdominal pain x 3 weeks.  Has had N/V/NB vomiting daily and also reports diarrhea every time she eats.  Also endorses headache, dizziness, and fatigue. Pt ran out of her insulin  about 2 weeks ago, went to get a refill at the pharmacy and told that the prescription was canceled.  Unfortunately, did not attempt to contact [facility] provider for assistance.  Patient also reports dry cough. Denies congestion, rhinorrhea, or sick contacts.  Reports subjective fever, never measured temperature.    In the ED, found to have hyperglycemia to 524. Had two low BP readings but pressures normalized prior to fluids. Got 1L bolus and started on EndoTool. Labs showed K 4.0, anion gap 20, BHB 4.9, pH  lactic acid 1.3.  WCC 12.6, Hgb 10.5.CXR and CTAP negative for acute pathology. UA with ***.  Review Of Systems: Per HPI with the following additions: ***  Pertinent Past Medical History: T2DM (hospitalized for HHS in 4/24 iso insulin  nonadherance Adjustment disorder  Intentional metformin  OD (2019)   (*** review and detail significant medical history here***) Remainder reviewed in history tab.   Pertinent Past Surgical History: C/S Tubal ligation *** Remainder reviewed in history tab.  Pertinent Social History: Tobacco use:  Yes/No/Former Alcohol use: *** Other Substance use: *** Lives with ***  Pertinent Family History: ***  Remainder reviewed in history tab.   Important Outpatient Medications: Celexa  20mg  Pepcid  20mg  BID Semglee  18-30u daily*** Metformin  500mg  daily Vitamin D  50000u weekly*** last dose? *** Remainder reviewed in medication history.   Objective: BP 104/69   Pulse 93   Temp 99 F (37.2 C)   Resp 16   SpO2 100%  Exam: General: *** Eyes: *** ENTM: *** Neck: *** Cardiovascular: *** Respiratory: *** Gastrointestinal: *** MSK: *** Derm: *** Neuro: *** Psych: ***  Labs:  CBC BMET  Recent Labs  Lab 03/13/24 2045 03/13/24 2058  WBC 12.6*  --   HGB 10.0* 10.5*  10.9*  HCT 31.3* 31.0*  32.0*  PLT 472*  --    Recent Labs  Lab 03/13/24 2045 03/13/24 2058  NA 131* 129*  129*  K 4.1 4.0  4.0  CL 90* 91*  CO2 21*  --   BUN 16 16  CREATININE 1.07* 0.80  GLUCOSE 508* 549*  CALCIUM 8.8*  --     Lipase 33 VBG: ph 7.392/pCO2 37.9/pO2 82  EKG: NSR, no ST abnormalities, TWI. QTcB: 428   Imaging Studies Performed: CXR: no acute abnormalities  CTAP: PIERRETTE Adele Song, MD 03/13/2024, 10:16 PM PGY-1, [facility]  FPTS Intern pager: [phone], text pages welcome Secure chat group [facility] Teaching Service

## 2024-03-13 NOTE — H&P (Addendum)
 Hospital Admission History and Physical Service Pager: [phone]  Patient name: Shelby Brown Medical record number: [id number] Date of Birth: 06-May-1981 Age: 43 y.o. Gender: female  Primary Care Provider: Adele Song, MD Consultants: None Code Status: Full Preferred Emergency Contact:  Contact Information     Name Relation Home Work Mobile   Brown,Shelby Spouse [phone]  [phone]   R,Shelby Brown [phone]     Shelby Brown Brown   [phone]      Other Contacts   None on File      Chief Complaint: Abdominal pain  Assessment and Plan: Shelby Brown is a 43 y.o. female with PMH poorly controlled type II DM presenting with abdominal pain, nausea, vomiting, and hyperglycemia to the 500s in the setting of not taking insulin  for 2 weeks.  Given history and lab findings, presentation most consistent with a mixed DKA/HHS picture.  Other diagnoses considered include pancreatitis (lipase WNL), cholecystitis (normal gallbladder on CTAP), SBO (patient endorses daily BMs), and pyelonephritis (afebrile, no evidence of this on CTAP). Patient is hemodynamically stable with normal mentation and has been started on IV insulin  via Endotool.  Will follow BMPs every 4 hours and transition to subcutaneous insulin  once anion gap has closed x2 and patient able to tolerate p.o. intake.  Will also consult case management to assist patient with access to insulin  and other medications.  Assessment & Plan DKA (diabetic ketoacidosis) (HCC) Mixed picture of DKA and HHS.  Glucose >500 with AG 20, pH WNL, decreased pCO2 (37.9), elevated BHB 4.9.  Initial K 4.0. S/p 1L LR bolus in ED. Presentation 2/2 patient running out of insulin .  As of most recent [facility] note in early May 2025, patient was prescribed 18U LAI and 500 mg metformin  daily. --IV insulin  per Endotool -- Fluids:  --Additional 1L LR bolus followed by 125mg /hr LR while receiving IV insulin  -- Will  transition to D5LR infusion once glucose <250 --10 mEq IV Kcl x2 ordered, will continue to replete potassium based on subsequent BMPs --CBG monitoring per EndoTool --BMP every 4 hours --Zofran  ODT 4mg  q8h PRN --NPO while on Endotool --Strict I/Os --AM CBC, BMP, HA1C --Follow-up UA -- Consult to diabetes coordinator -- Consult to [facility] for medication assistance  Chronic health problem Adjustment disorder: home celexa  20mg  daily   FEN/GI: NPO while on EndoTool VTE Prophylaxis: Lovenox  daily  Disposition: Progressive  History of Present Illness:  Shelby Brown is a 43 y.o. female presenting with abdominal pain, nausea and vomiting found to be hyperglycemic to the 500s.  Vomiting and abdominal pain x 3 weeks.  Has had NB/NB vomiting daily and also reports diarrhea every time she eats.  Also endorses headache, dizziness, and fatigue. Pt ran out of her insulin  about 2 weeks ago, went to get a refill at the pharmacy and told that the prescription was canceled.  Unfortunately, did not attempt to contact [facility] provider for assistance.  Patient also reports dry cough. Denies congestion, rhinorrhea, or sick contacts.  Reports subjective fever, never measured temperature.    Also endorses heartburn symptoms over the past few weeks.    Finally, reports pain with urination, denies vaginal discharge, denies vaginal itching.  Denies new medications or dose adjustments recently.  In the ED, found to have hyperglycemia to 524. Had two low BP readings but pressures normalized prior to fluids. Got 1L bolus and started on EndoTool. Labs showed K 4.0, anion gap 20, BHB 4.9, pH 7.392. WCC 12.6, Hgb 10.5.CXR and CTAP negative for acute pathology.  UA pending.  Review Of Systems: Per HPI with the following additions: As above  Pertinent Past Medical History: T2DM (hospitalized for HHS in 4/24 iso insulin  nonadherance Adjustment disorder Intentional metformin  OD (2019)   Remainder reviewed in  history tab.   Pertinent Past Surgical History: C/S Tubal ligation  Remainder reviewed in history tab.  Pertinent Social History: Tobacco use: Never Alcohol use: None Other Substance use: None Lives with husband and 3 daughters  Pertinent Family History:  Remainder reviewed in history tab.   Important Outpatient Medications: Celexa  20mg  Pepcid  20mg  BID Semglee  18u daily Metformin  500mg  daily Vitamin D  50000u weekly  Remainder reviewed in medication history.   Objective: BP 104/69   Pulse 93   Temp 99 F (37.2 C)   Resp 16   SpO2 100%  Exam: General: Sitting up in bed, awake and conversant, no acute distress, occasionally expectorates minimal white phlegm Eyes: Anicteric sclerae ENTM: Somewhat dry mucous membranes Neck: Supple Cardiovascular: RRR, normal S1/2, no M/R history Respiratory: CTAB, normal WOB on RA, no W/R/R Gastrointestinal: Normoactive bowel sounds, soft, mild TTP to epigastric area, nondistended MSK: Normal tone Derm: Warm, dry Neuro: AAO x 3, no focal deficits Psych: Appropriate mood and affect  Labs:  CBC BMET  Recent Labs  Lab 03/13/24 2045 03/13/24 2058  WBC 12.6*  --   HGB 10.0* 10.5*  10.9*  HCT 31.3* 31.0*  32.0*  PLT 472*  --    Recent Labs  Lab 03/13/24 2045 03/13/24 2058  NA 131* 129*  129*  K 4.1 4.0  4.0  CL 90* 91*  CO2 21*  --   BUN 16 16  CREATININE 1.07* 0.80  GLUCOSE 508* 549*  CALCIUM 8.8*  --     Lipase 33 VBG: ph 7.392/pCO2 37.9/pO2 82  EKG: NSR, no ST abnormalities, TWI. QTcB: 428   Imaging Studies Performed: CXR: no acute abnormalities  CTAP: No acute intra-abdominal or intrapelvic pathology   Adele Song, MD 03/13/2024, 10:16 PM PGY-1, [facility]  FPTS Intern pager: [phone], text pages welcome Secure chat group [facility] Teaching Service

## 2024-03-13 NOTE — ED Provider Notes (Signed)
  EMERGENCY DEPARTMENT AT [facility] Provider Note  CSN: [id number] Arrival date & time: 03/13/24 2012  Chief Complaint(s) Emesis  HPI Shelby Brown is a 43 y.o. female history of diabetes presenting to the emergency department with nausea and vomiting.  Patient reports nausea and vomiting for 3 weeks.  Reports associated fatigue, weakness.  Also has not taken her diabetic medications for the past 2 weeks because she ran out.  Reports some cough and mild shortness of breath.  She also reports mild diffuse abdominal pain.  No diarrhea.  Reports feeling lightheaded and dizzy.  No fevers or chills.   Past Medical History Past Medical History:  Diagnosis Date   Adjustment disorder with mixed anxiety and depressed mood 10/27/2017   AKI (acute kidney injury) (HCC) 03/27/2021   Chest pain 12/25/2023   Concern for acute stroke 12/27/2023   COVID-19 virus infection 09/01/2020   Diabetes mellitus without complication (HCC)    DKA (diabetic ketoacidosis) (HCC) 03/27/2021   Hyperosmolar hyperglycemic state (HHS) (HCC) 12/25/2023   Intentional metformin  overdose (HCC) 10/27/2017   Lobar pneumonia (HCC) 09/08/2020   Melena 11/17/2020   Pyelonephritis 11/16/2020   Patient Active Problem List   Diagnosis Date Noted   Chronic health problem 03/13/2024   Healthcare maintenance 01/17/2024   Hemianopia, homonymous, 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 Authorizing Provider  acetaminophen  (TYLENOL ) 325 MG tablet Take 650 mg by mouth every 6 (six) hours as needed for mild pain (pain score 1-3) or moderate pain (pain score 4-6). Patient not taking: Reported  on 01/01/2024    [provider]  blood glucose meter kit and supplies KIT Dispense based on patient and insurance preference. Use up to four times daily as directed. (FOR ICD-9 250.00, 250.01). Patient not taking: Reported on 01/10/2024 09/13/20   Lue Elsie BROCKS, MD  citalopram  (CELEXA ) 20 MG tablet Take 1 tablet (20 mg total) by mouth daily. 01/17/24   Tharon Lung, MD  Continuous Glucose Sensor (DEXCOM G7 SENSOR) MISC Apply one sensor to back of arm every 10 days 01/10/24   McDiarmid, Krystal BIRCH, MD  famotidine  (PEPCID ) 20 MG tablet Take 1 tablet (20 mg total) by mouth 2 (two) times daily. 12/29/23   Quillen, Michael, MD  insulin  glargine-yfgn (SEMGLEE ) 100 UNIT/ML Pen Inject 18-30 Units into the skin daily. 01/10/24   McDiarmid, Krystal BIRCH, MD  Insulin  Pen Needle (BD PEN NEEDLE NANO 2ND GEN) 32G X 4 MM MISC Use for injection once daily 01/10/24   McDiarmid, Krystal BIRCH, MD  metFORMIN  (GLUCOPHAGE -XR) 500 MG 24 hr tablet Take 1 tablet (500 mg total) by mouth daily with breakfast. 12/30/23   Alba Sharper, MD  ondansetron  (ZOFRAN -ODT) 4 MG disintegrating tablet Take 1 tablet (4 mg total) by mouth every 8 (eight) hours as needed for nausea or vomiting. Patient not taking: Reported on 01/01/2024 11/18/23   Yolande Lamar BROCKS, MD  Vitamin D , Ergocalciferol , (DRISDOL ) 1.25 MG (50000 UNIT) CAPS capsule Take 1 capsule (50,000 Units total) by mouth every 7 (seven) days for 8 doses. 01/23/24 03/13/24  Tharon Lung, MD  promethazine  (PHENERGAN ) 6.25 MG/5ML syrup Take 5 mLs (6.25 mg total) by mouth 4 (four) times daily as needed for nausea or vomiting.  Patient not taking: Reported on 11/16/2020 09/13/20 11/19/20  Lue Elsie BROCKS, MD                                                                                                                                    Past Surgical History Past Surgical History:  Procedure Laterality Date   CESAREAN SECTION     TUBAL LIGATION     Family History Family History  Problem  Relation Age of Onset   Cancer Father    Diabetes Sister    Diabetes Sister     Social History Social History   Tobacco Use   Smoking status: Never   Smokeless tobacco: Never  Substance Use Topics   Alcohol use: No   Drug use: No   Allergies Patient has no known allergies.  Review of Systems Review of Systems  All other systems reviewed and are negative.   Physical Exam Vital Signs  I have reviewed the triage vital signs BP 104/69   Pulse 93   Temp 99 F (37.2 C)   Resp 16   SpO2 100%  Physical Exam Vitals and nursing note reviewed.  Constitutional:      General: She is not in acute distress.    Appearance: She is well-developed.  HENT:     Head: Normocephalic and atraumatic.     Mouth/Throat:     Mouth: Mucous membranes are dry.   Eyes:     Pupils: Pupils are equal, round, and reactive to light.    Cardiovascular:     Rate and Rhythm: Normal rate and regular rhythm.     Heart sounds: No murmur heard. Pulmonary:     Effort: Pulmonary effort is normal. No respiratory distress.     Breath sounds: Normal breath sounds.  Abdominal:     General: Abdomen is flat.     Palpations: Abdomen is soft.     Tenderness: There is abdominal tenderness (generalized).   Musculoskeletal:        General: No tenderness.     Right lower leg: No edema.     Left lower leg: No edema.   Skin:    General: Skin is warm and dry.   Neurological:     General: No focal deficit present.     Mental Status: She is alert. Mental status is at baseline.   Psychiatric:        Mood and Affect: Mood normal.        Behavior: Behavior normal.     ED Results and Treatments Labs (all labs ordered are listed, but only abnormal results are displayed) Labs Reviewed  COMPREHENSIVE METABOLIC PANEL WITH GFR - Abnormal; Notable for the following components:      Result Value   Sodium 131 (*)    Chloride 90 (*)    CO2 21 (*)    Glucose, Bld 508 (*)    Creatinine, Ser 1.07 (*)  Calcium 8.8 (*)    Albumin 2.5 (*)    AST 11 (*)    Anion gap 20 (*)    All other components within normal limits  CBC WITH DIFFERENTIAL/PLATELET - Abnormal; Notable for the following components:   WBC 12.6 (*)    RBC 3.72 (*)    Hemoglobin 10.0 (*)    HCT 31.3 (*)    Platelets 472 (*)    Neutro Abs 10.2 (*)    All other components within normal limits  BETA-HYDROXYBUTYRIC ACID - Abnormal; Notable for the following components:   Beta-Hydroxybutyric Acid 4.90 (*)    All other components within normal limits  CBG MONITORING, ED - Abnormal; Notable for the following components:   Glucose-Capillary 524 (*)    All other components within normal limits  I-STAT CHEM 8, ED - Abnormal; Notable for the following components:   Sodium 129 (*)    Chloride 91 (*)    Glucose, Bld 549 (*)    Calcium, Ion 1.12 (*)    TCO2 21 (*)    Hemoglobin 10.9 (*)    HCT 32.0 (*)    All other components within normal limits  I-STAT VENOUS BLOOD GAS, ED - Abnormal; Notable for the following components:   pCO2, Ven 37.9 (*)    pO2, Ven 82 (*)    Sodium 129 (*)    Calcium, Ion 1.11 (*)    HCT 31.0 (*)    Hemoglobin 10.5 (*)    All other components within normal limits  HCG, SERUM, QUALITATIVE  LIPASE, BLOOD  URINALYSIS, W/ REFLEX TO CULTURE (INFECTION SUSPECTED)  MAGNESIUM   I-STAT CG4 LACTIC ACID, ED  I-STAT CG4 LACTIC ACID, ED                                                                                                                          Radiology CT ABDOMEN PELVIS W CONTRAST Result Date: 03/13/2024 CLINICAL DATA:  Abdominal pain, acute, nonlocalized Pt reports dizziness, vomiting and abd pain x 3 weeks associated with productive cough of green stuff. Hx of diabetes EXAM: CT ABDOMEN AND PELVIS WITH CONTRAST TECHNIQUE: Multidetector CT imaging of the abdomen and pelvis was performed using the standard protocol following bolus administration of intravenous contrast. RADIATION DOSE REDUCTION: This  exam was performed according to the departmental dose-optimization program which includes automated exposure control, adjustment of the mA and/or kV according to patient size and/or use of iterative reconstruction technique. CONTRAST:  50mL OMNIPAQUE  IOHEXOL  350 MG/ML SOLN COMPARISON:  None Available. FINDINGS: Lower chest: No acute abnormality. Hepatobiliary: No focal liver abnormality. No gallstones, gallbladder wall thickening, or pericholecystic fluid. No biliary dilatation. Pancreas: No focal lesion. Normal pancreatic contour. No surrounding inflammatory changes. No main pancreatic ductal dilatation. Spleen: Normal in size without focal abnormality. Adrenals/Urinary Tract: No adrenal nodule bilaterally. Bilateral kidneys enhance symmetrically. No hydronephrosis. No hydroureter. The urinary bladder is unremarkable. Stomach/Bowel: Stomach is within normal limits. No evidence of bowel wall thickening or dilatation.  Appendix appears normal. Vascular/Lymphatic: No abdominal aorta or iliac aneurysm. Mild atherosclerotic plaque of the aorta and its branches. No abdominal, pelvic, or inguinal lymphadenopathy. Reproductive: Posterior uterine intramural fibroid. Otherwise uterus and bilateral adnexa are unremarkable. Bilateral tubal ligation. Other: No intraperitoneal free fluid. No intraperitoneal free gas. No organized fluid collection. Musculoskeletal: No abdominal wall hernia or abnormality. No suspicious lytic or blastic osseous lesions. No acute displaced fracture. IMPRESSION: 1. No acute intra-abdominal or intrapelvic abnormality. 2. Posterior uterine intramural fibroid. 3.  Aortic Atherosclerosis (ICD10-I70.0). Electronically Signed   By: Morgane  Naveau M.D.   On: 03/13/2024 22:41    Pertinent labs & imaging results that were available during my care of the patient were reviewed by me and considered in my medical decision making (see MDM for details).  Medications Ordered in ED Medications  insulin   regular, human (MYXREDLIN ) 100 units/ 100 mL infusion (has no administration in time range)  lactated ringers  infusion (has no administration in time range)  dextrose  5 % in lactated ringers  infusion (has no administration in time range)  dextrose  50 % solution 0-50 mL (has no administration in time range)  potassium chloride  10 mEq in 100 mL IVPB (has no administration in time range)  lactated ringers  bolus 1,000 mL (1,000 mLs Intravenous New Bag/Given 03/13/24 2047)  iohexol  (OMNIPAQUE ) 350 MG/ML injection 50 mL (50 mLs Intravenous Contrast Given 03/13/24 2149)                                                                                                                                     Procedures .Critical Care  Performed by: Francesca Elsie CROME, MD Authorized by: Francesca Elsie CROME, MD   Critical care provider statement:    Critical care time (minutes):  30   Critical care was necessary to treat or prevent imminent or life-threatening deterioration of the following conditions:  Endocrine crisis   Critical care was time spent personally by me on the following activities:  Development of treatment plan with patient or surrogate, discussions with consultants, evaluation of patient's response to treatment, examination of patient, ordering and review of laboratory studies, ordering and review of radiographic studies, ordering and performing treatments and interventions, pulse oximetry, re-evaluation of patient's condition and review of old charts   Care discussed with: admitting provider     (including critical care time)  Medical Decision Making / ED Course   MDM:  43 year old presenting to the emergency department with abdominal pain, nausea, vomiting.  Patient in no acute distress, initial vital signs with mild hypotension however on recheck this is not present.  No tachycardia.  Differential includes DKA, gastroparesis, HHS, dehydration, pancreatitis, cholecystitis, perforation,  volvulus, obstruction, ileus, UTI.  Will obtain testing including CT scan.  Will check labs including beta hydroxybutyrate and lipase.  Will reassess.  Clinical Course as of 03/13/24 2252  Thu Mar 13, 2024  2156 Anion gap(!): 20 [WS]  2250 CT  scan negative.  Labs do show sign of mild DKA with elevated beta hydroxybutyrate, anion gap, hyperglycemia.  Will start on insulin  infusion.  Will continue to fluid resuscitate.  Discussed with family medicine team who will admit patient for further workup of her DKA although suspect likely causes due to medication noncompliance given that she has not taken any of her medications in the last 2 weeks.  Chest x-ray also obtained given patient's reported cough and on my interpretation shows no acute process. [WS]    Clinical Course User Index [WS] Francesca Elsie CROME, MD     Additional history obtained: -Additional history obtained from family -External records from outside source obtained and reviewed including: Chart review including previous notes, labs, imaging, consultation notes including prior notes    Lab Tests: -I ordered, reviewed, and interpreted labs.   The pertinent results include:   Labs Reviewed  COMPREHENSIVE METABOLIC PANEL WITH GFR - Abnormal; Notable for the following components:      Result Value   Sodium 131 (*)    Chloride 90 (*)    CO2 21 (*)    Glucose, Bld 508 (*)    Creatinine, Ser 1.07 (*)    Calcium 8.8 (*)    Albumin 2.5 (*)    AST 11 (*)    Anion gap 20 (*)    All other components within normal limits  CBC WITH DIFFERENTIAL/PLATELET - Abnormal; Notable for the following components:   WBC 12.6 (*)    RBC 3.72 (*)    Hemoglobin 10.0 (*)    HCT 31.3 (*)    Platelets 472 (*)    Neutro Abs 10.2 (*)    All other components within normal limits  BETA-HYDROXYBUTYRIC ACID - Abnormal; Notable for the following components:   Beta-Hydroxybutyric Acid 4.90 (*)    All other components within normal limits  CBG  MONITORING, ED - Abnormal; Notable for the following components:   Glucose-Capillary 524 (*)    All other components within normal limits  I-STAT CHEM 8, ED - Abnormal; Notable for the following components:   Sodium 129 (*)    Chloride 91 (*)    Glucose, Bld 549 (*)    Calcium, Ion 1.12 (*)    TCO2 21 (*)    Hemoglobin 10.9 (*)    HCT 32.0 (*)    All other components within normal limits  I-STAT VENOUS BLOOD GAS, ED - Abnormal; Notable for the following components:   pCO2, Ven 37.9 (*)    pO2, Ven 82 (*)    Sodium 129 (*)    Calcium, Ion 1.11 (*)    HCT 31.0 (*)    Hemoglobin 10.5 (*)    All other components within normal limits  HCG, SERUM, QUALITATIVE  LIPASE, BLOOD  URINALYSIS, W/ REFLEX TO CULTURE (INFECTION SUSPECTED)  MAGNESIUM   I-STAT CG4 LACTIC ACID, ED  I-STAT CG4 LACTIC ACID, ED    Notable for elevated ketones, hyperglycemia   EKG   EKG Interpretation Date/Time:  Thursday March 13 2024 21:07:01 EDT Ventricular Rate:  93 PR Interval:  146 QRS Duration:  80 QT Interval:  344 QTC Calculation: 428 R Axis:   77  Text Interpretation: Sinus rhythm Low voltage, precordial leads Confirmed by Francesca Elsie ([id number]) on 03/13/2024 9:34:00 PM         Imaging Studies ordered: I ordered imaging studies including CT abdomen, CXR On my interpretation imaging demonstrates no acute process I independently visualized and interpreted imaging. I agree with the radiologist  interpretation   Medicines ordered and prescription drug management: Meds ordered this encounter  Medications   lactated ringers  bolus 1,000 mL   iohexol  (OMNIPAQUE ) 350 MG/ML injection 50 mL   insulin  regular, human (MYXREDLIN ) 100 units/ 100 mL infusion    EndoTool Goal Range::   140-180    Type of Diabetes:   Type 2    Mode of Therapy:   ENDOX1 for DKA    Start Method:   EndoTool to calculate   lactated ringers  infusion   dextrose  5 % in lactated ringers  infusion   dextrose  50 % solution 0-50  mL   potassium chloride  10 mEq in 100 mL IVPB    -I have reviewed the patients home medicines and have made adjustments as needed   Consultations Obtained: I requested consultation with the family practice team,  and discussed lab and imaging findings as well as pertinent plan - they recommend: admission   Cardiac Monitoring: The patient was maintained on a cardiac monitor.  I personally viewed and interpreted the cardiac monitored which showed an underlying rhythm of: NSR  Reevaluation: After the interventions noted above, I reevaluated the patient and found that their symptoms have improved  Co morbidities that complicate the patient evaluation  Past Medical History:  Diagnosis Date   Adjustment disorder with mixed anxiety and depressed mood 10/27/2017   AKI (acute kidney injury) (HCC) 03/27/2021   Chest pain 12/25/2023   Concern for acute stroke 12/27/2023   COVID-19 virus infection 09/01/2020   Diabetes mellitus without complication (HCC)    DKA (diabetic ketoacidosis) (HCC) 03/27/2021   Hyperosmolar hyperglycemic state (HHS) (HCC) 12/25/2023   Intentional metformin  overdose (HCC) 10/27/2017   Lobar pneumonia (HCC) 09/08/2020   Melena 11/17/2020   Pyelonephritis 11/16/2020      Dispostion: Disposition decision including need for hospitalization was considered, and patient admitted to the hospital.    Final Clinical Impression(s) / ED Diagnoses Final diagnoses:  Diabetic ketoacidosis without coma associated with type 2 diabetes mellitus (HCC)     This chart was dictated using voice recognition software.  Despite best efforts to proofread,  errors can occur which can change the documentation meaning.    Francesca Elsie CROME, MD 03/13/24 2252

## 2024-03-13 NOTE — ED Triage Notes (Signed)
 Pt reports dizziness, vomiting and abd pain x 3 weeks associated with productive cough of green stuff. Hx of diabetes. Triage done using ipad spanish interpreter.

## 2024-03-13 NOTE — Assessment & Plan Note (Addendum)
 Mixed picture of DKA and HHS.  Glucose >500 with AG 20, pH WNL, decreased pCO2 (37.9), elevated BHB 4.9.  Initial K 4.0. S/p 1L LR bolus in ED. Presentation 2/2 patient running out of insulin .  As of most recent [facility] note in early May 2025, patient was prescribed 18U LAI and 500 mg metformin  daily. --IV insulin  per Endotool -- Fluids:  --Additional 1L LR bolus followed by 125mg /hr LR while receiving IV insulin  -- Will transition to D5LR infusion once glucose <250 --10 mEq IV Kcl x2 ordered, will continue to replete potassium based on subsequent BMPs --CBG monitoring per EndoTool --BMP every 4 hours --Zofran  ODT 4mg  q8h PRN --NPO while on Endotool --Strict I/Os --AM CBC, BMP, HA1C --Follow-up UA -- Consult to diabetes coordinator -- Consult to [facility] for medication assistance

## 2024-03-13 NOTE — Assessment & Plan Note (Addendum)
 Adjustment disorder: home celexa  20mg  daily

## 2024-03-14 DIAGNOSIS — E1110 Type 2 diabetes mellitus with ketoacidosis without coma: Secondary | ICD-10-CM

## 2024-03-14 DIAGNOSIS — R110 Nausea: Secondary | ICD-10-CM | POA: Insufficient documentation

## 2024-03-14 LAB — BASIC METABOLIC PANEL WITH GFR
Anion gap: 12 (ref 5–15)
Anion gap: 16 — ABNORMAL HIGH (ref 5–15)
Anion gap: 5 (ref 5–15)
Anion gap: 8 (ref 5–15)
BUN: 11 mg/dL (ref 6–20)
BUN: 14 mg/dL (ref 6–20)
BUN: 8 mg/dL (ref 6–20)
BUN: 9 mg/dL (ref 6–20)
CO2: 24 mmol/L (ref 22–32)
CO2: 26 mmol/L (ref 22–32)
CO2: 26 mmol/L (ref 22–32)
CO2: 28 mmol/L (ref 22–32)
Calcium: 8.5 mg/dL — ABNORMAL LOW (ref 8.9–10.3)
Calcium: 8.5 mg/dL — ABNORMAL LOW (ref 8.9–10.3)
Calcium: 8.7 mg/dL — ABNORMAL LOW (ref 8.9–10.3)
Calcium: 9.1 mg/dL (ref 8.9–10.3)
Chloride: 91 mmol/L — ABNORMAL LOW (ref 98–111)
Chloride: 97 mmol/L — ABNORMAL LOW (ref 98–111)
Chloride: 99 mmol/L (ref 98–111)
Chloride: 99 mmol/L (ref 98–111)
Creatinine, Ser: 0.62 mg/dL (ref 0.44–1.00)
Creatinine, Ser: 0.69 mg/dL (ref 0.44–1.00)
Creatinine, Ser: 0.71 mg/dL (ref 0.44–1.00)
Creatinine, Ser: 0.83 mg/dL (ref 0.44–1.00)
GFR, Estimated: >60 mL/min (ref >60)
GFR, Estimated: >60 mL/min (ref >60)
GFR, Estimated: >60 mL/min (ref >60)
GFR, Estimated: >60 mL/min (ref >60)
Glucose, Bld: 134 mg/dL — ABNORMAL HIGH (ref 70–99)
Glucose, Bld: 152 mg/dL — ABNORMAL HIGH (ref 70–99)
Glucose, Bld: 190 mg/dL — ABNORMAL HIGH (ref 70–99)
Glucose, Bld: 384 mg/dL — ABNORMAL HIGH (ref 70–99)
Potassium: 3.8 mmol/L (ref 3.5–5.1)
Potassium: 3.8 mmol/L (ref 3.5–5.1)
Potassium: 4 mmol/L (ref 3.5–5.1)
Potassium: 4.5 mmol/L (ref 3.5–5.1)
Sodium: 131 mmol/L — ABNORMAL LOW (ref 135–145)
Sodium: 132 mmol/L — ABNORMAL LOW (ref 135–145)
Sodium: 133 mmol/L — ABNORMAL LOW (ref 135–145)
Sodium: 135 mmol/L (ref 135–145)

## 2024-03-14 LAB — CBC
HCT: 29.2 % — ABNORMAL LOW (ref 36.0–46.0)
Hemoglobin: 9.5 g/dL — ABNORMAL LOW (ref 12.0–15.0)
MCH: 27.1 pg (ref 26.0–34.0)
MCHC: 32.5 g/dL (ref 30.0–36.0)
MCV: 83.2 fL (ref 80.0–100.0)
Platelets: 474 10*3/uL — ABNORMAL HIGH (ref 150–400)
RBC: 3.51 MIL/uL — ABNORMAL LOW (ref 3.87–5.11)
RDW: 12.9 % (ref 11.5–15.5)
WBC: 10 10*3/uL (ref 4.0–10.5)
nRBC: 0 % (ref 0.0–0.2)

## 2024-03-14 LAB — CBG MONITORING, ED
Glucose-Capillary: 115 mg/dL — ABNORMAL HIGH (ref 70–99)
Glucose-Capillary: 123 mg/dL — ABNORMAL HIGH (ref 70–99)
Glucose-Capillary: 132 mg/dL — ABNORMAL HIGH (ref 70–99)
Glucose-Capillary: 136 mg/dL — ABNORMAL HIGH (ref 70–99)
Glucose-Capillary: 148 mg/dL — ABNORMAL HIGH (ref 70–99)
Glucose-Capillary: 154 mg/dL — ABNORMAL HIGH (ref 70–99)
Glucose-Capillary: 154 mg/dL — ABNORMAL HIGH (ref 70–99)
Glucose-Capillary: 157 mg/dL — ABNORMAL HIGH (ref 70–99)
Glucose-Capillary: 165 mg/dL — ABNORMAL HIGH (ref 70–99)
Glucose-Capillary: 167 mg/dL — ABNORMAL HIGH (ref 70–99)
Glucose-Capillary: 182 mg/dL — ABNORMAL HIGH (ref 70–99)
Glucose-Capillary: 184 mg/dL — ABNORMAL HIGH (ref 70–99)
Glucose-Capillary: 194 mg/dL — ABNORMAL HIGH (ref 70–99)
Glucose-Capillary: 295 mg/dL — ABNORMAL HIGH (ref 70–99)
Glucose-Capillary: 393 mg/dL — ABNORMAL HIGH (ref 70–99)

## 2024-03-14 LAB — GLUCOSE, CAPILLARY
Glucose-Capillary: 142 mg/dL — ABNORMAL HIGH (ref 70–99)
Glucose-Capillary: 147 mg/dL — ABNORMAL HIGH (ref 70–99)

## 2024-03-14 LAB — HEMOGLOBIN A1C
Hgb A1c MFr Bld: 14.6 % — ABNORMAL HIGH (ref 4.8–5.6)
Mean Plasma Glucose: 372.32 mg/dL

## 2024-03-14 LAB — I-STAT CG4 LACTIC ACID, ED: Lactic Acid, Venous: 1.1 mmol/L (ref 0.5–1.9)

## 2024-03-14 MED ORDER — MAGNESIUM SULFATE 2 GM/50ML IV SOLN
2.0000 g | Freq: Once | INTRAVENOUS | Status: AC
  Administered 2024-03-14: 2 g via INTRAVENOUS
  Filled 2024-03-14: qty 50

## 2024-03-14 MED ORDER — INSULIN GLARGINE-YFGN 100 UNIT/ML ~~LOC~~ SOLN
10.0000 [IU] | SUBCUTANEOUS | Status: DC
  Administered 2024-03-14: 10 [IU] via SUBCUTANEOUS
  Filled 2024-03-14 (×2): qty 0.1

## 2024-03-14 MED ORDER — FAMOTIDINE 20 MG PO TABS
20.0000 mg | ORAL_TABLET | Freq: Every day | ORAL | Status: DC
  Administered 2024-03-14 – 2024-03-15 (×2): 20 mg via ORAL
  Filled 2024-03-14 (×2): qty 1

## 2024-03-14 MED ORDER — ONDANSETRON 4 MG PO TBDP
8.0000 mg | ORAL_TABLET | Freq: Three times a day (TID) | ORAL | Status: DC | PRN
  Administered 2024-03-14: 8 mg via ORAL
  Filled 2024-03-14 (×2): qty 2

## 2024-03-14 MED ORDER — INSULIN ASPART 100 UNIT/ML IJ SOLN
0.0000 [IU] | Freq: Three times a day (TID) | INTRAMUSCULAR | Status: DC
  Administered 2024-03-14 (×2): 2 [IU] via SUBCUTANEOUS

## 2024-03-14 MED ORDER — PROCHLORPERAZINE EDISYLATE 10 MG/2ML IJ SOLN
10.0000 mg | Freq: Once | INTRAMUSCULAR | Status: AC
  Administered 2024-03-14: 10 mg via INTRAVENOUS
  Filled 2024-03-14: qty 2

## 2024-03-14 MED ORDER — POTASSIUM CHLORIDE 10 MEQ/100ML IV SOLN
10.0000 meq | INTRAVENOUS | Status: AC
  Administered 2024-03-14 (×2): 10 meq via INTRAVENOUS
  Filled 2024-03-14 (×2): qty 100

## 2024-03-14 NOTE — Assessment & Plan Note (Addendum)
 Likely secondary to DKA. Will treat with ondansetron , monitor and expect this to improve as her hyperglycemia improves.  Adjustment disorder: Continue Celexa  20mg  daily

## 2024-03-14 NOTE — Inpatient Diabetes Management (Signed)
 Inpatient Diabetes Program Recommendations  AACE/ADA: New Consensus Statement on Inpatient Glycemic Control (2015)  Target Ranges:  Prepandial:   less than 140 mg/dL      Peak postprandial:   less than 180 mg/dL (1-2 hours)      Critically ill patients:  140 - 180 mg/dL   Lab Results  Component Value Date   GLUCAP 148 (H) 03/14/2024   HGBA1C >15.5 (H) 12/26/2023    Review of Glycemic Control  Diabetes history: DM2 Outpatient Diabetes medications: Semglee  18-30 units scale daily, Metformin  500 mg daily Current orders for Inpatient glycemic control: IV insulin , Semglee  10 units daily, Novolog  0-15 units correction scale TID  Inpatient Diabetes Program Recommendations:   Received diabetes coordinator consult. Diabetes coordinator spoke with patient on last admission on 12/26/23. Noted that patient ran out of insulin  prior to this current admission. Noted that patient does not have insurance.   Recommend patient to have affordable insulin  at discharge such as Relion [employer] insulin  70/30 insulin  mixture for $43 per box of 5 insulin  pens.    Will continue to follow while in the hospital.  Marjorie Lunger RN BSN CDE Diabetes Coordinator Pager: [phone]  8am-5pm

## 2024-03-14 NOTE — Assessment & Plan Note (Signed)
 Stable.  A1c***.  - Semglee  10u daily - Carb Modified Diet - Continue LR 125 mL/h *** - BMP daily - Strict I/Os - Labs: Collect UA, f/u A1c - Diabetes coordinator consulted, appreciate recs - TOC consulted for med assistance, appreciate recs

## 2024-03-14 NOTE — Assessment & Plan Note (Addendum)
 CBGs downtrended on Endotool and AG closed x2.  - Semglee  10u daily - Discontinued D5 fluids, continue Endotool for 2 hours while transitioning - Carb Modified Diet - Continue LR - BMP every 4 hours - Zofran  ODT 8mg  q8h PRN - Strict I/Os - Labs: Collect UA, f/u A1c - Diabetes coordinator consulted, appreciate recs - TOC consulted for med assistance, appreciate recs

## 2024-03-14 NOTE — ED Notes (Signed)
 Secure chat sent to DO regarding orders for insulin  drip to be discontinued. Awaiting response at this time. Most recent cbg was 132 at 1522.

## 2024-03-14 NOTE — Progress Notes (Signed)
     Daily Progress Note Intern Pager: [phone]  Patient name: Shelby Brown Medical record number: [id number] Date of birth: July 30, 1981 Age: 43 y.o. Gender: female  Primary Care Provider: Adele Song, MD Consultants: None Code Status: Full code  Pt Overview and Major Events to Date:  6/26: Admitted for DKA 6/27: Transition to subcu insulin   Assessment and Plan:  Shelby Brown is a 43 y.o. female admitted for DKA now transitioned to subQ insulin . Pertinent PMH/PSH includes type 2 diabetes and MDD.  Assessment & Plan DKA (diabetic ketoacidosis) (HCC) Stable.  A1c***.  - Semglee  10u daily - Carb Modified Diet - Continue LR 125 mL/h *** - BMP daily - Strict I/Os - Labs: Collect UA, f/u A1c - Diabetes coordinator consulted, appreciate recs - TOC consulted for med assistance, appreciate recs Nausea Likely secondary to DKA. Will treat with ondansetron , monitor and expect this to improve as her hyperglycemia improves.  Adjustment disorder: Continue Celexa  20mg  daily   FEN/GI: Carb modified PPx: Lovenox  Dispo:Home pending clinical improvement . Barriers include ongoing medical management.   Subjective:  ***  Objective: Temp:  [98.2 F (36.8 C)-99.1 F (37.3 C)] 98.2 F (36.8 C) (06/27 1717) Pulse Rate:  [80-118] 94 (06/27 1717) Resp:  [0-18] 16 (06/27 1717) BP: (70-170)/(50-98) 143/84 (06/27 1717) SpO2:  [98 %-100 %] 100 % (06/27 1717) Weight:  [47.3 kg] 47.3 kg (06/27 1709) Physical Exam: General: *** Cardiovascular: *** Respiratory: *** Abdomen: *** Extremities: ***  Laboratory: Most recent CBC Lab Results  Component Value Date   WBC 10.0 03/14/2024   HGB 9.5 (L) 03/14/2024   HCT 29.2 (L) 03/14/2024   MCV 83.2 03/14/2024   PLT 474 (H) 03/14/2024   Most recent BMP    Latest Ref Rng & Units 03/14/2024    2:40 PM  BMP  Glucose 70 - 99 mg/dL 865   BUN 6 - 20 mg/dL 9   Creatinine 9.55 - 8.99 mg/dL 9.37   Sodium 864 - 854 mmol/L 133    Potassium 3.5 - 5.1 mmol/L 4.0   Chloride 98 - 111 mmol/L 99   CO2 22 - 32 mmol/L 26   Calcium 8.9 - 10.3 mg/dL 8.5     Imaging/Diagnostic Tests: No new imaging.  Cleotilde Perkins, DO 03/14/2024, 7:43 PM  PGY-2, [facility] FPTS Intern pager: [phone], text pages welcome Secure chat group [facility] Teaching Service

## 2024-03-14 NOTE — Progress Notes (Signed)
 Pharmacy - Insulin    Spoke with patient and husband. She does not have insurance and does not qualify for medication assistance. Explained that she needs to be seen in the [facility] at least yearly to get a new prescription for insulin . [employer]'s Relion 70/30 mix would be the least costly insulin  for her on discharge.  Thank you for involving pharmacy in this patient's care.  Delon Sax, PharmD, BCPS Clinical Pharmacist Clinical phone for 03/14/2024 is [phone] 03/14/2024 4:16 PM

## 2024-03-14 NOTE — Assessment & Plan Note (Addendum)
 Adjustment disorder: Continue Celexa  20mg  daily

## 2024-03-14 NOTE — Progress Notes (Signed)
     Daily Progress Note Intern Pager: [phone]  Patient name: Shelby Brown Medical record number: [id number] Date of birth: 1981-08-15 Age: 43 y.o. Gender: female  Primary Care Provider: Adele Song, MD Consultants: None Code Status: Full Code  Pt Overview and Major Events to Date:  6/26: Admitted; Endotool  Assessment and Plan: Shelby Brown is a 43 y.o. female with PMH of poorly controlled T2DM, presenting in DKA with glucose in the 500s, in the setting of not taking insulin  for 2 weeks sinve she ran out of insulin  and allegedly the prescription was cancelled per pharmacy.  Assessment & Plan DKA (diabetic ketoacidosis) (HCC) CBGs downtrended on Endotool and AG closed x2.  - Semglee  10u daily - Discontinued D5 fluids, continue Endotool for 2 hours while transitioning - Carb Modified Diet - Continue LR - BMP every 4 hours - Zofran  ODT 8mg  q8h PRN - Strict I/Os - Labs: Collect UA, f/u A1c - Diabetes coordinator consulted, appreciate recs - TOC consulted for med assistance, appreciate recs Chronic health problem Adjustment disorder: Continue Celexa  20mg  daily  FEN/GI: Carb Modified Diet PPx: Lovenox  Dispo: Pending clinical improvement off Endotool  Subjective:  Patient reports that she is doing okay this morning.  Has some abdominal pain that is radiating to her chest.  Reports that this has been present for her whole admission so far.  Worsens with nausea and vomiting.  Understands that once her sugars improve her symptoms well as well.  Objective: Temp:  [98.3 F (36.8 C)-99.1 F (37.3 C)] 98.3 F (36.8 C) (06/27 1146) Pulse Rate:  [88-118] 88 (06/27 0900) Resp:  [0-18] 0 (06/27 0900) BP: (70-170)/(50-98) 108/64 (06/27 0900) SpO2:  [98 %-100 %] 99 % (06/27 0900) Physical Exam: General: Awake and Alert in NAD HEENT: NCAT. Sclera anicteric. No rhinorrhea. Cardiovascular: RRR. No M/R/G Respiratory: CTAB, normal WOB on RA. No wheezing, crackles,  rhonchi, or diminished breath sounds. Abdomen: Soft, mild TTP to epigastric area, non-distended. Bowel sounds normoactive Extremities: Able to move all extremities. No BLE edema, no deformities or significant joint findings. Skin: Warm and dry. No abrasions or rashes noted. Neuro: A&Ox3. No focal neurological deficits.  Laboratory: Most recent CBC Lab Results  Component Value Date   WBC 10.0 03/14/2024   HGB 9.5 (L) 03/14/2024   HCT 29.2 (L) 03/14/2024   MCV 83.2 03/14/2024   PLT 474 (H) 03/14/2024   Most recent BMP    Latest Ref Rng & Units 03/14/2024    8:46 AM  BMP  Glucose 70 - 99 mg/dL 847   BUN 6 - 20 mg/dL 8   Creatinine 9.55 - 8.99 mg/dL 9.30   Sodium 864 - 854 mmol/L 132   Potassium 3.5 - 5.1 mmol/L 4.5   Chloride 98 - 111 mmol/L 99   CO2 22 - 32 mmol/L 28   Calcium 8.9 - 10.3 mg/dL 8.5     Imaging/Diagnostic Tests: No new imaging.  Janna Ferrier, DO 03/14/2024, 12:12 PM  PGY-1, [facility] FPTS Intern pager: [phone], text pages welcome Secure chat group [facility] Teaching Service

## 2024-03-15 ENCOUNTER — Other Ambulatory Visit (HOSPITAL_COMMUNITY): Payer: Self-pay

## 2024-03-15 DIAGNOSIS — R110 Nausea: Secondary | ICD-10-CM

## 2024-03-15 DIAGNOSIS — R1013 Epigastric pain: Secondary | ICD-10-CM

## 2024-03-15 DIAGNOSIS — G8929 Other chronic pain: Secondary | ICD-10-CM

## 2024-03-15 LAB — BASIC METABOLIC PANEL WITH GFR
Anion gap: 8 (ref 5–15)
BUN: 5 mg/dL — ABNORMAL LOW (ref 6–20)
CO2: 29 mmol/L (ref 22–32)
Calcium: 8.5 mg/dL — ABNORMAL LOW (ref 8.9–10.3)
Chloride: 98 mmol/L (ref 98–111)
Creatinine, Ser: 0.64 mg/dL (ref 0.44–1.00)
GFR, Estimated: >60 mL/min (ref >60)
Glucose, Bld: 106 mg/dL — ABNORMAL HIGH (ref 70–99)
Potassium: 4.2 mmol/L (ref 3.5–5.1)
Sodium: 135 mmol/L (ref 135–145)

## 2024-03-15 LAB — GLUCOSE, CAPILLARY
Glucose-Capillary: 102 mg/dL — ABNORMAL HIGH (ref 70–99)
Glucose-Capillary: 186 mg/dL — ABNORMAL HIGH (ref 70–99)

## 2024-03-15 MED ORDER — ONDANSETRON 8 MG PO TBDP: 8.0000 mg | ORAL_TABLET | Freq: Three times a day (TID) | ORAL | 0 refills | Status: DC | PRN

## 2024-03-15 MED ORDER — ONDANSETRON 8 MG PO TBDP
8.0000 mg | ORAL_TABLET | Freq: Three times a day (TID) | ORAL | 0 refills | Status: DC | PRN
Start: 2024-03-15 — End: 2024-06-19
  Filled 2024-03-15: qty 20, 7d supply, fill #0

## 2024-03-15 MED ORDER — FAMOTIDINE 20 MG PO TABS: 20.0000 mg | ORAL_TABLET | Freq: Every day | ORAL | 0 refills | Status: DC

## 2024-03-15 MED ORDER — INSULIN PEN NEEDLE 31G X 8 MM MISC
1.0000 | Freq: Two times a day (BID) | 0 refills | Status: DC
  Filled 2024-03-15: qty 100, 30d supply, fill #0

## 2024-03-15 MED ORDER — FAMOTIDINE 20 MG PO TABS
20.0000 mg | ORAL_TABLET | Freq: Every day | ORAL | 0 refills | Status: DC
  Filled 2024-03-15: qty 30, 30d supply, fill #0

## 2024-03-15 MED ORDER — INSULIN ASPART PROT & ASPART (70-30 MIX) 100 UNIT/ML PEN
7.0000 [IU] | PEN_INJECTOR | Freq: Two times a day (BID) | SUBCUTANEOUS | 0 refills | Status: DC
  Filled 2024-03-15: qty 3, 21d supply, fill #0

## 2024-03-15 MED ORDER — INSULIN ASPART PROT & ASPART (70-30 MIX) 100 UNIT/ML ~~LOC~~ SUSP
7.0000 [IU] | Freq: Two times a day (BID) | SUBCUTANEOUS | 1 refills | Status: DC
  Filled 2024-03-15: qty 10, 71d supply, fill #0

## 2024-03-15 MED ORDER — INSULIN ASPART PROT & ASPART (70-30 MIX) 100 UNIT/ML ~~LOC~~ SUSP: 7.0000 [IU] | Freq: Two times a day (BID) | SUBCUTANEOUS | 1 refills | Status: DC

## 2024-03-15 MED ORDER — CITALOPRAM HYDROBROMIDE 20 MG PO TABS: 20.0000 mg | ORAL_TABLET | Freq: Every day | ORAL | 0 refills | Status: DC

## 2024-03-15 MED ORDER — CITALOPRAM HYDROBROMIDE 20 MG PO TABS
20.0000 mg | ORAL_TABLET | Freq: Every day | ORAL | 0 refills | Status: DC
  Filled 2024-03-15: qty 30, 30d supply, fill #0

## 2024-03-15 MED ORDER — INSULIN ASPART PROT & ASPART (70-30 MIX) 100 UNIT/ML ~~LOC~~ SUSP
7.0000 [IU] | Freq: Two times a day (BID) | SUBCUTANEOUS | Status: DC
  Filled 2024-03-15: qty 10

## 2024-03-15 NOTE — Discharge Instructions (Addendum)
 Dear Shelby Brown,   Gracias por permitirnos participar en su atencin. En esta seccin, encontrar un breve resumen de su ingreso hospitalario, explicando el motivo de su ingreso, lo ocurrido durante el mismo, su(s) diagnstico(s) y Dance movement psychotherapist seguimiento recomendado.  Se le atendi por cetoacidosis diabtica debido a un descontrol de los niveles de Production assistant, radio por no tomar insulina. Le tratamos con insulina y sus niveles de azcar en sangre mejoraron. Le estn dando de alta con un rgimen de insulina. Si tiene algn problema con su medicacin, comunquese con [employer] [facility] para que podamos ayudarle a [place]. Es muy importante que contine tomando insulina para Automotive engineer un episodio similar de cetoacidosis diabtica. Por favor, tome 7 unidades de la insulina 70/30 que recibe Management consultant.  Thank you for letting us  participate in your care! In this section, you will find a brief hospital admission summary of why you were admitted to the hospital, what happened during your admission, your diagnosis/diagnoses, and recommended follow up.   You were seen for diabetic ketoacidosis due to uncontrolled blood sugars from not taking your insulin .  We treated you with insulin  and your blood sugars improved.  You are being discharged on insulin  regimen, please contact [facility] if you have any issues with your medication as we can help you obtain it.  It is very important that you continue to take your insulin  to avoid having a similar episode of diabetic ketoacidosis. Please take 7 units of the 70/30 insulin  you receive every day.   POST-HOSPITAL & CARE INSTRUCTIONS We recommend following up with your PCP within 1 week from being discharged from the hospital. Please let PCP/Specialists know of any changes in medications that were made which you will be able to see in the medications section of this packet.  DOCTOR'S APPOINTMENTS & FOLLOW UP No future  appointments.   Thank you for choosing [facility]! Take care and be well!  Family Medicine Teaching Service Inpatient Team [place]  [facility]  [address] [place], [place] [address] [phone]

## 2024-03-15 NOTE — Hospital Course (Addendum)
 Shelby Brown is a 43 y.o.female with a history of poorly controlled T2DM, adjustment disorder and prior SI attempt who was admitted to the family medicine teaching Service at [facility] for DKA. Her hospital course is detailed below:  DKA Presented with abdominal pain and N/V in the setting of hyperglycemia to 500s in the setting of not taking her insulin  x 2 weeks.  A1c 14.6.  Initiated on Endo tool, transition to subq insulin  on 6/27 with adequate CBG control on 10 units LAI.  Given lack of insurance and cost, transition to 70/30 insulin  and discharged on 7 units twice daily.  Medication sent to [facility] pharmacy.  Other chronic conditions were medically managed with home medications and formulary alternatives as necessary (adjustment disorder)  PCP Follow-up Recommendations:  Ensure she received her insulin  and is taking 7 units twice daily of 70/30 insulin 

## 2024-03-15 NOTE — Discharge Summary (Signed)
 Family Medicine Teaching [facility] Discharge Summary  Patient name: Shelby Brown Medical record number: [id number] Date of birth: 08-15-1981 Age: 43 y.o. Gender: female Date of Admission: 03/13/2024  Date of Discharge: 03/15/2024 Admitting Physician: Rea Raring, MD  Primary Care Provider: Raring Rea, MD Consultants: None  Indication for Hospitalization: DKA  Discharge Diagnoses/Problem List:  Principal Problem for Admission: DKA Other Problems addressed during stay:  Principal Problem:   DKA (diabetic ketoacidosis) (HCC) Active Problems:   Abdominal pain, chronic, epigastric   Chronic health problem   Nausea   Brief Hospital Course:  Shelby Brown is a 43 y.o.female with a history of poorly controlled T2DM, adjustment disorder and prior SI attempt who was admitted to the family medicine teaching Service at [facility] for DKA. Her hospital course is detailed below:  DKA Presented with abdominal pain and N/V in the setting of hyperglycemia to 500s in the setting of not taking her insulin  x 2 weeks.  A1c 14.6.  Initiated on Endo tool, transition to subq insulin  on 6/27 with adequate CBG control on 10 units LAI.  Given lack of insurance and cost, transition to 70/30 insulin  and discharged on 7 units twice daily.  Medication sent to [facility] pharmacy.  Other chronic conditions were medically managed with home medications and formulary alternatives as necessary (adjustment disorder)  PCP Follow-up Recommendations:  Ensure she received her insulin  and is taking 7 units twice daily of 70/30 insulin     Results/Tests Pending at Time of Discharge:  Unresulted Labs (From admission, onward)     Start     Ordered   03/15/24 0500  Basic metabolic panel  Daily,   R      03/14/24 1147   03/13/24 2045  Urinalysis, w/ Reflex to Culture (Infection Suspected) -Urine, Clean Catch  Once,   URGENT       Question:  Specimen Source  Answer:  Urine, Clean Catch   03/13/24 2045              Disposition: Home  Discharge Condition: Stable  Discharge Exam:  Vitals:   03/15/24 0744 03/15/24 1057  BP: 135/83 (!) 140/89  Pulse: 85   Resp: 18 19  Temp: 99.5 F (37.5 C) 99.2 F (37.3 C)  SpO2: 99% 100%   Physical Exam performed by Dr. Damien Pinal: General: Chronically ill-appearing, no acute distress Cardio: RRR, no murmur on exam Pulm: clear, No increased work of breathing Abdomen: Soft, bowel sounds present, nontender Extremity: No peripheral edema     Significant Procedures: None  Significant Labs and Imaging:  Recent Labs  Lab 03/13/24 2045 03/13/24 2058 03/14/24 0317  WBC 12.6*  --  10.0  HGB 10.0* 10.5*  10.9* 9.5*  HCT 31.3* 31.0*  32.0* 29.2*  PLT 472*  --  474*   Recent Labs  Lab 03/13/24 2045 03/13/24 2058 03/14/24 0001 03/14/24 0317 03/14/24 0846 03/14/24 1440 03/15/24 0323  NA 131*   < > 131* 135 132* 133* 135  K 4.1   < > 3.8 3.8 4.5 4.0 4.2  CL 90*   < > 91* 97* 99 99 98  CO2 21*  --  24 26 28 26 29   GLUCOSE 508*   < > 384* 190* 152* 134* 106*  BUN 16   < > 14 11 8 9  5*  CREATININE 1.07*   < > 0.83 0.71 0.69 0.62 0.64  CALCIUM 8.8*  --  9.1 8.7* 8.5* 8.5* 8.5*  MG 1.8  --   --   --   --   --   --  ALKPHOS 60  --   --   --   --   --   --   AST 11*  --   --   --   --   --   --   ALT 8  --   --   --   --   --   --   ALBUMIN 2.5*  --   --   --   --   --   --    < > = values in this interval not displayed.    Pertinent Imaging: None   Discharge Medications:  Allergies as of 03/15/2024   No Known Allergies      Medication List     STOP taking these medications    ergocalciferol  1.25 MG (50000 UT) capsule Commonly known as: VITAMIN D2       TAKE these medications    acetaminophen  325 MG tablet Commonly known as: TYLENOL  Take 650 mg by mouth every 6 (six) hours as needed for mild pain (pain score 1-3) or moderate pain (pain score 4-6).   citalopram  20 MG tablet Commonly known as: CELEXA  Tome 1  tableta (20 mg en total) por va oral diariamente. (Take 1 tablet (20 mg total) by mouth daily.)   famotidine  20 MG tablet Commonly known as: PEPCID  Tome 1 tableta (20 mg en total) por va oral diariamente. (Take 1 tablet (20 mg total) by mouth daily.)   insulin  aspart protamine- aspart (70-30) 100 UNIT/ML injection Commonly known as: NOVOLOG  MIX 70/30 Inject 0.07 mLs (7 Units total) into the skin 2 (two) times daily with a meal.   ondansetron  8 MG disintegrating tablet Commonly known as: ZOFRAN -ODT Take 1 tablet (8 mg total) by mouth every 8 (eight) hours as needed for nausea or vomiting.        Discharge Instructions: Please refer to Patient Instructions section of EMR for full details.  Patient was counseled important signs and symptoms that should prompt return to medical care, changes in medications, dietary instructions, activity restrictions, and follow up appointments.   Follow-Up Appointments:  Follow-up Information     [facility] Follow up.   Why: 7/1 @ 2:30PM Contact information: [address] [place]  [address] [phone]                Theophilus Pagan, MD 03/15/2024, 12:40 PM PGY-2, [facility] Family [facility]

## 2024-03-15 NOTE — Inpatient Diabetes Management (Signed)
 Inpatient Diabetes Program Recommendations  AACE/ADA: New Consensus Statement on Inpatient Glycemic Control   Target Ranges:  Prepandial:   less than 140 mg/dL      Peak postprandial:   less than 180 mg/dL (1-2 hours)      Critically ill patients:  140 - 180 mg/dL    Latest Reference Range & Units 03/14/24 13:42 03/14/24 14:08 03/14/24 15:22 03/14/24 17:43 03/14/24 20:16 03/15/24 06:13  Glucose-Capillary 70 - 99 mg/dL 876 (H) 884 (H) 867 (H) 142 (H) 147 (H) 102 (H)    Review of Glycemic Control  Diabetes history: DM2 Outpatient Diabetes medications: Semglee  18-30 units daily, Metformin  500 mg QD Current orders for Inpatient glycemic control: Semglee  10 units Q24H, Novolog  0-15 units TID with meals  Inpatient Diabetes Program Recommendations:    Outpatient DM: Recommend to discharge on Novolin  70/30 7 units BID (dose will provide 9.8 units for basal and 4.2 units for meal coverage per day).  Discharge Recommendations: Intermediate acting recommendations: insulin  isophane & regular (NOVOLIN  70/30 RELION PEN) ([facility] Only) dosage to be determined by physician    Use Adult Diabetes Insulin  Treatment Post Discharge order set.  Note: Noted consult for Diabetes Coordinator. Diabetes Coordinator is not on campus over the weekend but available by pager from 8am to 5pm for questions or concerns. Patient does not have any insurance, therefore, recommend discharging on Novolin  70/30 insulin  pens which are $43 per  box of 5 insulin  pens at [facility]. Inpatient diabetes coordinator spoke with patient on 12/26/23 and 12/28/23.   Thanks, Earnie Gainer, RN, MSN, CDCES Diabetes Coordinator Inpatient Diabetes Program [phone] (Team Pager from 8am to 5pm)

## 2024-03-15 NOTE — Care Management (Signed)
 MATCH MEDICATION ASSISTANCE CARD Pharmacies please call: [phone] for claim processing assistance.  Rx BIN: [id number] Rx Group: [id number] Rx PCN: [id number] Relationship Code: 1 Person Code: 01  Patient ID (MRN): [id number]   Patient Name: Shelby Brown    Patient DOB: 13-Nov-1980    Discharge Date:03/15/24    Expiration Date:03/25/24 (must be filled within 7 days of discharge)  Requested MD to send meds through [facility] pharmacy, MATCH has been provided to [facility] pharmacy. MATCH will not work at [facility].

## 2024-03-17 ENCOUNTER — Other Ambulatory Visit (HOSPITAL_COMMUNITY): Payer: Self-pay

## 2024-03-17 ENCOUNTER — Other Ambulatory Visit (HOSPITAL_BASED_OUTPATIENT_CLINIC_OR_DEPARTMENT_OTHER): Payer: Self-pay

## 2024-03-17 ENCOUNTER — Telehealth: Payer: Self-pay | Admitting: Pharmacist

## 2024-03-17 NOTE — Telephone Encounter (Signed)
 Patient's daughter contacted for follow-up of insulin  need.   I have worked with [name], the daughter, at the time of insulin  initiation.  She reported that the insulin  order had been discontinued at her last request from the pharmacy.    Contacted pharmacy - A refill was requested however, it never appears to have been refilled. Appears that NO PRINT orders automatically discontinue old orders and DO NOT take the new dose on file.  In the future, it was suggested to use the NORMAL and communicate lack of need to the pharmacy by submitting PLACE on HOLD for FUTURE USE in the comment to pharmacy field.   Patient has appointment with Dr. Nicholas tomorrow 7/1  2:30 PM  I will try to clarify and explain to patient at that time.   Patient likely needs indigent supply of insulin  if possible. I will request assistance from [facility], CPhT.  Total time with patient call and documentation of interaction: 22 minutes.  F/U Phone call planned: None

## 2024-03-18 ENCOUNTER — Emergency Department (HOSPITAL_COMMUNITY): Payer: MEDICAID

## 2024-03-18 ENCOUNTER — Ambulatory Visit (INDEPENDENT_AMBULATORY_CARE_PROVIDER_SITE_OTHER): Payer: Self-pay | Admitting: Family Medicine

## 2024-03-18 ENCOUNTER — Encounter: Payer: Self-pay | Admitting: Family Medicine

## 2024-03-18 ENCOUNTER — Inpatient Hospital Stay (HOSPITAL_COMMUNITY)
Admission: EM | Admit: 2024-03-18 | Discharge: 2024-03-21 | DRG: 690 | Disposition: A | Discharge status: Home / Self Care | Payer: MEDICAID | Source: Ambulatory Visit | Attending: Family Medicine | Admitting: Family Medicine

## 2024-03-18 ENCOUNTER — Encounter (HOSPITAL_COMMUNITY): Payer: Self-pay | Admitting: Emergency Medicine

## 2024-03-18 VITALS — BP 88/59 | HR 88 | Wt 102.4 lb

## 2024-03-18 DIAGNOSIS — N12 Tubulo-interstitial nephritis, not specified as acute or chronic: Principal | ICD-10-CM

## 2024-03-18 DIAGNOSIS — E118 Type 2 diabetes mellitus with unspecified complications: Secondary | ICD-10-CM

## 2024-03-18 DIAGNOSIS — Z8701 Personal history of pneumonia (recurrent): Secondary | ICD-10-CM

## 2024-03-18 DIAGNOSIS — F329 Major depressive disorder, single episode, unspecified: Secondary | ICD-10-CM | POA: Diagnosis present

## 2024-03-18 DIAGNOSIS — Z809 Family history of malignant neoplasm, unspecified: Secondary | ICD-10-CM

## 2024-03-18 DIAGNOSIS — Z794 Long term (current) use of insulin: Secondary | ICD-10-CM

## 2024-03-18 DIAGNOSIS — E119 Type 2 diabetes mellitus without complications: Secondary | ICD-10-CM

## 2024-03-18 DIAGNOSIS — E861 Hypovolemia: Secondary | ICD-10-CM | POA: Diagnosis present

## 2024-03-18 DIAGNOSIS — E785 Hyperlipidemia, unspecified: Secondary | ICD-10-CM | POA: Diagnosis present

## 2024-03-18 DIAGNOSIS — E1169 Type 2 diabetes mellitus with other specified complication: Secondary | ICD-10-CM | POA: Diagnosis present

## 2024-03-18 DIAGNOSIS — Z833 Family history of diabetes mellitus: Secondary | ICD-10-CM

## 2024-03-18 DIAGNOSIS — K219 Gastro-esophageal reflux disease without esophagitis: Secondary | ICD-10-CM | POA: Diagnosis present

## 2024-03-18 DIAGNOSIS — R053 Chronic cough: Secondary | ICD-10-CM | POA: Diagnosis present

## 2024-03-18 DIAGNOSIS — N3289 Other specified disorders of bladder: Secondary | ICD-10-CM | POA: Diagnosis present

## 2024-03-18 DIAGNOSIS — I951 Orthostatic hypotension: Secondary | ICD-10-CM

## 2024-03-18 DIAGNOSIS — E1165 Type 2 diabetes mellitus with hyperglycemia: Secondary | ICD-10-CM

## 2024-03-18 DIAGNOSIS — N390 Urinary tract infection, site not specified: Principal | ICD-10-CM | POA: Diagnosis present

## 2024-03-18 DIAGNOSIS — I959 Hypotension, unspecified: Secondary | ICD-10-CM

## 2024-03-18 DIAGNOSIS — N3090 Cystitis, unspecified without hematuria: Secondary | ICD-10-CM

## 2024-03-18 DIAGNOSIS — Z789 Other specified health status: Secondary | ICD-10-CM

## 2024-03-18 DIAGNOSIS — E11649 Type 2 diabetes mellitus with hypoglycemia without coma: Secondary | ICD-10-CM | POA: Diagnosis present

## 2024-03-18 DIAGNOSIS — E162 Hypoglycemia, unspecified: Secondary | ICD-10-CM

## 2024-03-18 DIAGNOSIS — Z79899 Other long term (current) drug therapy: Secondary | ICD-10-CM

## 2024-03-18 DIAGNOSIS — R9389 Abnormal findings on diagnostic imaging of other specified body structures: Secondary | ICD-10-CM | POA: Insufficient documentation

## 2024-03-18 DIAGNOSIS — R112 Nausea with vomiting, unspecified: Secondary | ICD-10-CM

## 2024-03-18 DIAGNOSIS — D649 Anemia, unspecified: Secondary | ICD-10-CM | POA: Diagnosis present

## 2024-03-18 DIAGNOSIS — D259 Leiomyoma of uterus, unspecified: Secondary | ICD-10-CM | POA: Diagnosis present

## 2024-03-18 DIAGNOSIS — Z9151 Personal history of suicidal behavior: Secondary | ICD-10-CM

## 2024-03-18 DIAGNOSIS — Z8616 Personal history of COVID-19: Secondary | ICD-10-CM

## 2024-03-18 LAB — CBC WITH DIFFERENTIAL/PLATELET
Abs Immature Granulocytes: 0.06 10*3/uL (ref 0.00–0.07)
Basophils Absolute: 0 10*3/uL (ref 0.0–0.1)
Basophils Relative: 0 %
Eosinophils Absolute: 0.1 10*3/uL (ref 0.0–0.5)
Eosinophils Relative: 1 %
HCT: 30.1 % — ABNORMAL LOW (ref 36.0–46.0)
Hemoglobin: 9.4 g/dL — ABNORMAL LOW (ref 12.0–15.0)
Immature Granulocytes: 1 %
Lymphocytes Relative: 18 %
Lymphs Abs: 2.1 10*3/uL (ref 0.7–4.0)
MCH: 27 pg (ref 26.0–34.0)
MCHC: 31.2 g/dL (ref 30.0–36.0)
MCV: 86.5 fL (ref 80.0–100.0)
Monocytes Absolute: 0.9 10*3/uL (ref 0.1–1.0)
Monocytes Relative: 8 %
Neutro Abs: 8.5 10*3/uL — ABNORMAL HIGH (ref 1.7–7.7)
Neutrophils Relative %: 72 %
Platelets: 581 10*3/uL — ABNORMAL HIGH (ref 150–400)
RBC: 3.48 MIL/uL — ABNORMAL LOW (ref 3.87–5.11)
RDW: 13.2 % (ref 11.5–15.5)
WBC: 11.6 10*3/uL — ABNORMAL HIGH (ref 4.0–10.5)
nRBC: 0 % (ref 0.0–0.2)

## 2024-03-18 LAB — COMPREHENSIVE METABOLIC PANEL WITH GFR
ALT: 9 U/L (ref 0–44)
AST: 17 U/L (ref 15–41)
Albumin: 2.6 g/dL — ABNORMAL LOW (ref 3.5–5.0)
Alkaline Phosphatase: 55 U/L (ref 38–126)
Anion gap: 10 (ref 5–15)
BUN: 8 mg/dL (ref 6–20)
CO2: 28 mmol/L (ref 22–32)
Calcium: 8.7 mg/dL — ABNORMAL LOW (ref 8.9–10.3)
Chloride: 98 mmol/L (ref 98–111)
Creatinine, Ser: 0.93 mg/dL (ref 0.44–1.00)
GFR, Estimated: >60 mL/min (ref >60)
Glucose, Bld: 61 mg/dL — ABNORMAL LOW (ref 70–99)
Potassium: 4.3 mmol/L (ref 3.5–5.1)
Sodium: 136 mmol/L (ref 135–145)
Total Bilirubin: 0.4 mg/dL (ref 0.0–1.2)
Total Protein: 7.1 g/dL (ref 6.5–8.1)

## 2024-03-18 LAB — TROPONIN I (HIGH SENSITIVITY)
Troponin I (High Sensitivity): 6 ng/L (ref <18)
Troponin I (High Sensitivity): 6 ng/L (ref <18)

## 2024-03-18 LAB — CBG MONITORING, ED
Glucose-Capillary: 61 mg/dL — ABNORMAL LOW (ref 70–99)
Glucose-Capillary: 76 mg/dL (ref 70–99)
Glucose-Capillary: 124 mg/dL — ABNORMAL HIGH (ref 70–99)
Glucose-Capillary: 66 mg/dL — ABNORMAL LOW (ref 70–99)

## 2024-03-18 LAB — HCG, QUANTITATIVE, PREGNANCY: hCG, Beta Chain, Quant, S: 3 m[IU]/mL (ref <5)

## 2024-03-18 LAB — GLUCOSE, POCT (MANUAL RESULT ENTRY): POC Glucose: 78 mg/dL (ref 70–99)

## 2024-03-18 LAB — LIPASE, BLOOD: Lipase: 24 U/L (ref 11–51)

## 2024-03-18 MED ORDER — SODIUM CHLORIDE 0.9 % IV SOLN
1.0000 g | Freq: Once | INTRAVENOUS | Status: AC
  Administered 2024-03-18: 1 g via INTRAVENOUS
  Filled 2024-03-18: qty 10

## 2024-03-18 MED ORDER — SODIUM CHLORIDE 0.9 % IV BOLUS
1000.0000 mL | Freq: Once | INTRAVENOUS | Status: AC
  Administered 2024-03-18: 1000 mL via INTRAVENOUS

## 2024-03-18 MED ORDER — IOHEXOL 350 MG/ML SOLN
75.0000 mL | Freq: Once | INTRAVENOUS | Status: AC | PRN
  Administered 2024-03-18: 75 mL via INTRAVENOUS

## 2024-03-18 MED ORDER — ONDANSETRON HCL 4 MG/2ML IJ SOLN
4.0000 mg | Freq: Once | INTRAMUSCULAR | Status: AC
  Administered 2024-03-18: 4 mg via INTRAVENOUS
  Filled 2024-03-18: qty 2

## 2024-03-18 MED ORDER — DEXTROSE 50 % IV SOLN
0.5000 | Freq: Once | INTRAVENOUS | Status: AC
  Administered 2024-03-18: 25 mL via INTRAVENOUS
  Filled 2024-03-18: qty 50

## 2024-03-18 NOTE — Assessment & Plan Note (Signed)
 Patient with multiple hypoglycemic episodes.  Most likely due to insulin  use with inadequate p.o. intake.  Patient could have gastroenteritis.  Hypoglycemic DKA less likely given not on a beta-blocker or SGLT2 however is possible. - CPT discussion also notes that patient should be able to be switched to Semglee  and will be covered whenever patient has follow-up

## 2024-03-18 NOTE — Hospital Course (Addendum)
 Shelby Brown is a 43 y.o. female who was admitted to the [facility] Closed Medicine Teaching Service at [facility] for abdominal pain and hypovolemia secondary to UTI.  Hospital course is outlined below by problem.  UTI Presented after multiple days of abdominal pain, nausea, and vomiting to PCP office and found to be hypotensive with concurrent hypoglycemia.  Sent to ED for further evaluation, at which time CTAP showed cystitis and possible right pyelonephritis.  She was started on ceftriaxone  and IV fluids.  Urology was consulted regarding CT scan findings, and upon reviewing imaging and speaking with patient, suspected findings were 2/2 uncontrolled DM rather than pyelonephritis as explained below.  She was transitioned to oral antibiotics the following day with cephalexin  (7/3-7/7) for total 7 day course.  Chronic urothelial thickening CTAP 7/1 showing urothelial thickening of R ureter and diffuse bladder wall thickening, similar to CT in April.  Given concern for possible malignancy or anatomic abnormality, Urology was consulted.  Specialist suspected thickening was due to local effects of fibroid versus chronic hyperglycemia and on review of chart was not certain previous pyelonephritis diagnosis was accurate.  Urology recommended improving A1c control and signed off.  T2DM A1c previously 14.6.  She reported compliance with 70/30 insulin  at home.  She was started on sliding scale insulin , and on 7/3 basal insulin  was added with 5 units of glargine.  At discharge, patient received glargine pen through [facility] as part of financial aid process.  She will need to fill out financial aid paperwork when she goes to [facility] for refill.  Other conditions that were chronic and stable, treated with home meds or formulary equivalents: GERD, MDD  Issues for follow up: Ensure insulin  regimen compliance; have pt sign income form with [facility] [employer] to continue  receiving glargine ([facility]) Ask Dr. Koval to discuss with patient during or after appointment Thurs 7/10 re: Liberate trial Recheck Vitamin D  level Referral to Gyn for discussion re: large uterine fibroid which may be contributing to UTI/pyelo picture due to ureter compression Consider Urology referral in the future if patient continues to get recurrent UTIs

## 2024-03-18 NOTE — ED Provider Notes (Incomplete)
 [place] [facility] Provider Note   CSN: [id number] Arrival date & time: 03/18/24  1603     History {Add pertinent medical, surgical, social history, OB history to HPI:1} Chief Complaint  Patient presents with  . Hypotension    Sent by her MD for hypotension/vomiting, BP 88/59 at the MD office Pt looks pale and unsteady on her feet    Shelby Brown is a 43 y.o. female with PMH as listed below who presents with ***.   hypotension and hypoglycemia.  Referred here from PCP.  Recent admission for DKA  mp d50% and IV fluids   Past Medical History:  Diagnosis Date  . Adjustment disorder with mixed anxiety and depressed mood 10/27/2017  . AKI (acute kidney injury) (HCC) 03/27/2021  . Chest pain 12/25/2023  . Concern for acute stroke 12/27/2023  . COVID-19 virus infection 09/01/2020  . Diabetes mellitus without complication (HCC)   . DKA (diabetic ketoacidosis) (HCC) 03/27/2021  . Hyperosmolar hyperglycemic state (HHS) (HCC) 12/25/2023  . Intentional metformin  overdose (HCC) 10/27/2017  . Lobar pneumonia (HCC) 09/08/2020  . Melena 11/17/2020  . Pyelonephritis 11/16/2020       Home Medications Prior to Admission medications   Medication Sig Start Date End Date Taking? Authorizing Provider  acetaminophen  (TYLENOL ) 325 MG tablet Take 650 mg by mouth every 6 (six) hours as needed for mild pain (pain score 1-3) or moderate pain (pain score 4-6).    [provider]  citalopram  (CELEXA ) 20 MG tablet Take 1 tablet (20 mg total) by mouth daily. 03/15/24   Theophilus Pagan, MD  famotidine  (PEPCID ) 20 MG tablet Take 1 tablet (20 mg total) by mouth daily. 03/15/24   Theophilus Pagan, MD  insulin  aspart protamine - aspart (NOVOLOG  70/30 MIX) (70-30) 100 UNIT/ML FlexPen Inject 7 Units into the skin 2 (two) times daily. 03/15/24   Theophilus Pagan, MD  Insulin  Pen Needle 31G X 8 MM MISC Use one pen needle twice daily. 03/15/24   Theophilus Pagan, MD  ondansetron  (ZOFRAN -ODT) 8 MG disintegrating tablet Take 1 tablet (8 mg total) by mouth every 8 (eight) hours as needed for nausea or vomiting. 03/15/24   Theophilus Pagan, MD  promethazine  (PHENERGAN ) 6.25 MG/5ML syrup Take 5 mLs (6.25 mg total) by mouth 4 (four) times daily as needed for nausea or vomiting. Patient not taking: Reported on 11/16/2020 09/13/20 11/19/20  Lue Elsie BROCKS, MD      Allergies    Patient has no known allergies.    Review of Systems   Review of Systems A 10 point review of systems was performed and is negative unless otherwise reported in HPI.  Physical Exam Updated Vital Signs BP (!) 83/62   Pulse 89   Temp 98.6 F (37 C)   Resp 14   SpO2 99%  Physical Exam General: Normal appearing {Desc; female/female:11659}, lying in bed.  HEENT: PERRLA, Sclera anicteric, MMM, trachea midline.  Cardiology: RRR, no murmurs/rubs/gallops. BL radial and DP pulses equal bilaterally.  Resp: Normal respiratory rate and effort. CTAB, no wheezes, rhonchi, crackles.  Abd: Soft, non-tender, non-distended. No rebound tenderness or guarding.  GU: Deferred. MSK: No peripheral edema or signs of trauma. Extremities without deformity or TTP. No cyanosis or clubbing. Skin: warm, dry. No rashes or lesions. Back: No CVA tenderness Neuro: A&Ox4, CNs II-XII grossly intact. MAEs. Sensation grossly intact.  Psych: Normal mood and affect.   ED Results / Procedures / Treatments   Labs (all labs ordered are  listed, but only abnormal results are displayed) Labs Reviewed  CBG MONITORING, ED - Abnormal; Notable for the following components:      Result Value   Glucose-Capillary 61 (*)    All other components within normal limits  CBC WITH DIFFERENTIAL/PLATELET  COMPREHENSIVE METABOLIC PANEL WITH GFR  LIPASE, BLOOD  TROPONIN I (HIGH SENSITIVITY)    EKG None  Radiology No results found.  Procedures Procedures  {Document cardiac monitor, telemetry assessment procedure  when appropriate:1}  Medications Ordered in ED Medications  dextrose  50 % solution 25 mL (has no administration in time range)  sodium chloride  0.9 % bolus 1,000 mL (has no administration in time range)    ED Course/ Medical Decision Making/ A&P                          Medical Decision Making   This patient presents to the ED for concern of ***, this involves an extensive number of treatment options, and is a complaint that carries with it a high risk of complications and morbidity.  I considered the following differential and admission for this acute, potentially life threatening condition.   MDM:    ***  Clinical Course as of 03/18/24 1712  Tue Mar 18, 2024  1651 BP 118/79 [HN]    Clinical Course User Index [HN] Franklyn Sid SAILOR, MD    Labs: I Ordered, and personally interpreted labs.  The pertinent results include:  ***  Imaging Studies ordered: I ordered imaging studies including *** I independently visualized and interpreted imaging. I agree with the radiologist interpretation  Additional history obtained from ***.  External records from outside source obtained and reviewed including ***  Cardiac Monitoring: .The patient was maintained on a cardiac monitor.  I personally viewed and interpreted the cardiac monitored which showed an underlying rhythm of: ***  Reevaluation: After the interventions noted above, I reevaluated the patient and found that they have :{resolved/improved/worsened:23923::improved}  Social Determinants of Health: .***  Disposition:  ***  Co morbidities that complicate the patient evaluation . Past Medical History:  Diagnosis Date  . Adjustment disorder with mixed anxiety and depressed mood 10/27/2017  . AKI (acute kidney injury) (HCC) 03/27/2021  . Chest pain 12/25/2023  . Concern for acute stroke 12/27/2023  . COVID-19 virus infection 09/01/2020  . Diabetes mellitus without complication (HCC)   . DKA (diabetic ketoacidosis) (HCC)  03/27/2021  . Hyperosmolar hyperglycemic state (HHS) (HCC) 12/25/2023  . Intentional metformin  overdose (HCC) 10/27/2017  . Lobar pneumonia (HCC) 09/08/2020  . Melena 11/17/2020  . Pyelonephritis 11/16/2020     Medicines Meds ordered this encounter  Medications  . dextrose  50 % solution 25 mL  . sodium chloride  0.9 % bolus 1,000 mL    I have reviewed the patients home medicines and have made adjustments as needed  Problem List / ED Course: Problem List Items Addressed This Visit   None        {Document critical care time when appropriate:1} {Document review of labs and clinical decision tools ie heart score, Chads2Vasc2 etc:1}  {Document your independent review of radiology images, and any outside records:1} {Document your discussion with family members, caretakers, and with consultants:1} {Document social determinants of health affecting pt's care:1} {Document your decision making why or why not admission, treatments were needed:1}  This note was created using dictation software, which may contain spelling or grammatical errors.

## 2024-03-18 NOTE — Patient Instructions (Addendum)
 It was wonderful to see you today.  Please bring ALL of your medications with you to every visit.   Today we talked about:  Vomiting and low blood pressure - I am concerned, that you are dehydrated since you are still vomiting. This is causing you to have low blood pressure and low glucose. I would like you to go to the emergency room to get fluids and have a further evaluation into your abdominal pain and vomiting.    Thank you for choosing [facility].   Please call [phone] with any questions about today's appointment.   Areta Saliva, MD  Family Medicine

## 2024-03-18 NOTE — H&P (Incomplete)
 Hospital Admission History and Physical Service Pager: [phone]  Patient name: Shelby Brown Medical record number: [id number] Date of Birth: [birth year] Age: 43 y.o. Gender: female  Primary Care Provider: Adele Song, MD Consultants: None Code Status: Full Code Preferred Emergency Contact:  Contact Information     Name Relation Home Work Mobile   Brown,Shelby Spouse [phone]  [phone]   Shelby Brown Daughter [phone]     [name] Knee Daughter   [phone]      Other Contacts   None on File    Chief Complaint: Abdominal pain, n/v  Differential and Medical Decision Making Shelby Brown is a 43 y.o. female presenting with abdominal pain, n/v. Differential for this patient's presentation of this includes pyelonephritis (abd pain, n, v with hypotension and evidence on imaging), DKA (hx of DMII and episodes of DKA presenting with abd pain, n, v, unlikely given hypoglycemia), pancreatitis (possible with abd pain, n, v, unlikely due to lipase WNL), and cholecystitis (possible with abdominal pain, n, v, unlikely as no acute hepatobiliary process on CT).  Assessment & Plan Pyelonephritis Patient presented hypotensive and hypoglycemic with suprapubic and R flank pain. Initial concern for DKA given patient's history, but lack of hyperglycemia and subsequent evidence of cystitis and pyelonephritis on CT abd/pelvis point to pyelonephritis as cause of patient's abdominal symptoms. UA and culture pending.  -Admit to FMTS for IV fluids and antibiotics given significant nausea and vomiting, attending Dr Donzetta.  -Ceftriaxone  1g IV daily. Consider broadening if clinical condition worsens prior to culture resulting.  -Await UA and Ucx -Zofran  4mg  ODT PRN for nausea -Urothelial thickening appreciated on CTAP, which in the context of recent similar presentation of negative urine culture could indicate other pathology such as malignancy, consider urology consult if  not improving Type 2 diabetes mellitus without complications (HCC) A1c 14.6 on 6/27, using protamine - aspart 70/30 at home.  -sliding scale insulin , sensitive, adjust once PO intake improves  Hypovolemia Patient was sent to the ED from her PCP clinic due to hypotension and hypoglycemia, likely secondary to pyelonephritis and associated n/v and poor PO intake.  -S/P 1L bolus in the ED -IV LR 75ml/hr -Monitor I/Os and PO intake  Chronic and Stable Conditions: GERD - continue famotidine  MDD - continue citalopram   FEN/GI: Carb modified diet VTE Prophylaxis: Lovenox   Disposition: Med/Surg  History of Present Illness:  Shelby Brown is a 43 y.o. female recently discharged after a hospital stay for DKA/HHS. She is here with her daughter, Shelby Brown. She went to her follow up from the hospital today, and she was found to have hypotension and hypoglycemia. They sent her to the ED for that. Yesterday, her CBG was down to 61. She was recently admitted for DKA and placed on a new insulin  regimen. She was not eating as much with this insulin  regimen due to lower and R flank abdominal pain and vomiting that has not seemed to go away since her last admission. No blood in the vomit. Last episode of emesis was last night, and last meal was this AM. Also reports diarrhea after she eats that has also been going on for a while. She endorses dysuria but no frequency. No fevers or chills since last admission.   She has a history of pyelonephritis, and this episode is worse than the last.  She endorses a bit of epigastric pain that comes on after she eats or when she coughs, which she had had for 3 weeks now. She has been coughing up phlegm with  this that is green. She feels SOB when coughing.  Her back pain and nausea have improved since arriving at the hospital.   In the ED, patient presented with abdominal pain, nausea and vomiting, in the setting of recent admission for DKA/HHS. She had a BP of 83/62  with otherwise stable vital signs. Her labs were notable for hypoglycemia of 61, Na, K and lipase WNL and a negative pregnancy test. CT abdomen and pelvis demonstrated cystitis with R pylonephritis. ED physician consulted FMTS for admission for IV antibiotics and fluid resuscitation.   Review Of Systems: Per HPI.  Pertinent Past Medical History: Type 2 diabetes DKA HHS Pyelonephritis   Pertinent Past Surgical History: C section Tubal ligation   Pertinent Social History: Tobacco use: No  Alcohol use: No Other Substance use: No Lives with her husband and two daughters.   Pertinent Family History: Father - deceased, cancer Mother - alive, healthy 2 - sisters alive with diabetes 2 - brothers alive, healthy Remainder reviewed in history tab.   Important Outpatient Medications: Citalopram  20 mg tablet Famotidine  20 mg tablet Insulin  protamine aspart 70/30 subcu 2 times daily  Objective: BP 117/81   Pulse 80   Temp 98.4 F (36.9 C) (Axillary)   Resp (!) 7   Ht 5' 2 (1.575 m)   Wt 46.4 kg   SpO2 100%   BMI 18.71 kg/m  Exam: General: Well appearing, in no acute distress.  Cardiovascular: RRR, s1/s2, no m/r/g. Radial pulses 2/2 b/l.  Respiratory: CTAB, good air movement.  Gastrointestinal: Bowel sounds present. Non distended, soft abdomen with TTP in the suprapubic, RLQ and R flank regions. No guarding or rigidity.  Neuro: No focal deficits Psych: Appropriate mood and affect  Labs:  CBC BMET  Recent Labs  Lab 03/18/24 1603  WBC 11.6*  HGB 9.4*  HCT 30.1*  PLT 581*   Recent Labs  Lab 03/18/24 1603  NA 136  K 4.3  CL 98  CO2 28  BUN 8  CREATININE 0.93  GLUCOSE 61*  CALCIUM 8.7*     Pertinent additional labs: lipase 24. Urine pregnancy test negative.   EKG: Normal sinus rhythm, no evidence of BBB or STEMI.   Imaging Studies Performed:  CT abdomen pelvis Impression from Radiologist: Cystitis with right pyelonephritis.  Manon Jester,  DO 03/19/2024, 12:08 AM PGY-1, [facility]  FPTS Intern pager: [phone], text pages welcome Secure chat group [facility] Teaching Service   I agree with the assessment and plan as documented above.  [name], MD PGY-3, [facility]

## 2024-03-18 NOTE — ED Notes (Signed)
 Patient transported to CT

## 2024-03-18 NOTE — ED Provider Triage Note (Addendum)
 Emergency Medicine Provider Triage Evaluation Note  Lahoma Constantin , a 43 y.o. female  was evaluated in triage.  Pt complains of hypotension and hypoglycemia.  Referred here from PCP.  Recent admission for DKA  Review of Systems  Positive:  Negative:   Physical Exam  BP (!) 83/62   Pulse 89   Temp 98.6 F (37 C)   Resp 14   SpO2 99%  Gen:   Awake, no distress   Resp:  Normal effort  MSK:   Moves extremities without difficulty  Other:  Epigastric abdominal tenderness  Medical Decision Making  Medically screening exam initiated at 4:22 PM.  Appropriate orders placed.  Tesha Campos-Ramirez was informed that the remainder of the evaluation will be completed by another provider, this initial triage assessment does not replace that evaluation, and the importance of remaining in the ED until their evaluation is complete.  Patient is alert, CBG of 61, will start with oral repletion, 0.5 amp d50% and IV fluids   Saanvi Hakala H, PA-C 03/18/24 1624    Donnajean Lynwood DEL, PA-C 03/18/24 1631

## 2024-03-18 NOTE — ED Triage Notes (Signed)
 Pt sent from PCP with hypotension and low blood sugar and vomiting. Concerned with dehydration.

## 2024-03-18 NOTE — Progress Notes (Signed)
    SUBJECTIVE:   CHIEF COMPLAINT / HPI:   Hospital follow up  Patient was recently admitted to the hospital for DKA secondary to lack of access to insulin .  Recently discharged on 03/15/2024.  Discharged on 7 units twice daily of 70/30.  Patient previously was on Semglee  but was discharged on 70/30 due to concerns for affordability.  She was also discharged on Zofran  for nausea. Today patient reports that her abdominal pain while she was admitted has not gone away.  Reports she has still been vomiting since she left the hospital.  Has been taking Zofran  about once to twice a day and says it does help however she is still vomiting throughout the day and not able to tolerate much p.o.   Yesterday had a BS of 61 yesterday afternoon.  Has still been taking her 7 units of insulin  twice a day despite low. Has not had a BM in 3 days.  Denies any sick contacts or fever.  PERTINENT  PMH / PSH: T2DM   OBJECTIVE:   BP (!) 88/59   Pulse 88   Wt 102 lb 6.4 oz (46.4 kg)   SpO2 96%   BMI 18.73 kg/m   General: Ill-appearing, sways if standing for more than 1 minute CV: RRR, radial pulses equal and palpable, no BLE edema, cap refill 3 seconds Resp: Normal work of breathing on room air Abd: Soft, nondistended, tender to palpation periumbilical, right upper quadrant, left lower quadrant and right lower quadrant.  Guards when palpating tender areas.  No rebound tenderness Neuro: Alert & Oriented x 4   Orthostatic vitals positive ASSESSMENT/PLAN:   Assessment & Plan Type 2 diabetes mellitus with hyperglycemia, with long-term current use of insulin  [facility]) Patient with multiple hypoglycemic episodes.  Most likely due to insulin  use with inadequate p.o. intake.  Patient could have gastroenteritis.  Hypoglycemic DKA less likely given not on a beta-blocker or SGLT2 however is possible. - CPT discussion also notes that patient should be able to be switched to Semglee  and will be covered whenever patient has  follow-up Orthostatic hypotension Hypotension in clinic today most likely due to dehydration secondary to vomiting.  This could be due to gastroenteritis or relapse of DKA.  Patient is nondistended and not febrile no peritoneal signs to suggest acute abdomen. - Recommended patient go to the ED for fluids and emergent evaluation of hypotension and vomiting. - Patient and patient's daughter acknowledged recommendation and were in agreement patient chose to go to ED with the support of her daughter.   Will follow-up after ED evaluation and discharge or after admission  Areta Saliva, MD [facility]

## 2024-03-18 NOTE — ED Notes (Signed)
 Pt given applesauce, crackers and apple juice per EDP request

## 2024-03-18 NOTE — ED Notes (Signed)
 Patient transported to MRI

## 2024-03-19 ENCOUNTER — Other Ambulatory Visit: Payer: Self-pay

## 2024-03-19 ENCOUNTER — Encounter (HOSPITAL_COMMUNITY): Payer: Self-pay | Admitting: Family Medicine

## 2024-03-19 DIAGNOSIS — E119 Type 2 diabetes mellitus without complications: Secondary | ICD-10-CM

## 2024-03-19 DIAGNOSIS — Z794 Long term (current) use of insulin: Secondary | ICD-10-CM

## 2024-03-19 DIAGNOSIS — E118 Type 2 diabetes mellitus with unspecified complications: Secondary | ICD-10-CM

## 2024-03-19 DIAGNOSIS — E861 Hypovolemia: Secondary | ICD-10-CM

## 2024-03-19 DIAGNOSIS — R9389 Abnormal findings on diagnostic imaging of other specified body structures: Secondary | ICD-10-CM | POA: Insufficient documentation

## 2024-03-19 HISTORY — DX: Long term (current) use of insulin: E11.9

## 2024-03-19 LAB — URINALYSIS, ROUTINE W REFLEX MICROSCOPIC
Bilirubin Urine: NEGATIVE
Glucose, UA: NEGATIVE mg/dL
Ketones, ur: NEGATIVE mg/dL
Nitrite: NEGATIVE
Protein, ur: 30 mg/dL — AB
Specific Gravity, Urine: 1.026 (ref 1.005–1.030)
WBC, UA: >50 WBC/hpf (ref 0–5)
pH: 7 (ref 5.0–8.0)

## 2024-03-19 LAB — CBC
HCT: 24.9 % — ABNORMAL LOW (ref 36.0–46.0)
Hemoglobin: 7.9 g/dL — ABNORMAL LOW (ref 12.0–15.0)
MCH: 26.7 pg (ref 26.0–34.0)
MCHC: 31.7 g/dL (ref 30.0–36.0)
MCV: 84.1 fL (ref 80.0–100.0)
Platelets: 475 10*3/uL — ABNORMAL HIGH (ref 150–400)
RBC: 2.96 MIL/uL — ABNORMAL LOW (ref 3.87–5.11)
RDW: 13.2 % (ref 11.5–15.5)
WBC: 10.8 10*3/uL — ABNORMAL HIGH (ref 4.0–10.5)
nRBC: 0 % (ref 0.0–0.2)

## 2024-03-19 LAB — BASIC METABOLIC PANEL WITH GFR
Anion gap: 9 (ref 5–15)
BUN: 5 mg/dL — ABNORMAL LOW (ref 6–20)
CO2: 26 mmol/L (ref 22–32)
Calcium: 7.9 mg/dL — ABNORMAL LOW (ref 8.9–10.3)
Chloride: 101 mmol/L (ref 98–111)
Creatinine, Ser: 0.74 mg/dL (ref 0.44–1.00)
GFR, Estimated: >60 mL/min (ref >60)
Glucose, Bld: 137 mg/dL — ABNORMAL HIGH (ref 70–99)
Potassium: 4.1 mmol/L (ref 3.5–5.1)
Sodium: 136 mmol/L (ref 135–145)

## 2024-03-19 LAB — GLUCOSE, CAPILLARY
Glucose-Capillary: 63 mg/dL — ABNORMAL LOW (ref 70–99)
Glucose-Capillary: 127 mg/dL — ABNORMAL HIGH (ref 70–99)
Glucose-Capillary: 121 mg/dL — ABNORMAL HIGH (ref 70–99)
Glucose-Capillary: 115 mg/dL — ABNORMAL HIGH (ref 70–99)
Glucose-Capillary: 220 mg/dL — ABNORMAL HIGH (ref 70–99)
Glucose-Capillary: 251 mg/dL — ABNORMAL HIGH (ref 70–99)

## 2024-03-19 LAB — MICROALBUMIN / CREATININE URINE RATIO
Creatinine, Urine: 274.2 mg/dL
Microalb/Creat Ratio: 230 mg/g{creat} — ABNORMAL HIGH (ref 0–29)
Microalbumin, Urine: 629.3 ug/mL

## 2024-03-19 MED ORDER — IBUPROFEN 200 MG PO TABS
600.0000 mg | ORAL_TABLET | Freq: Once | ORAL | Status: AC
  Administered 2024-03-19: 600 mg via ORAL
  Filled 2024-03-19: qty 3

## 2024-03-19 MED ORDER — FAMOTIDINE 20 MG PO TABS
20.0000 mg | ORAL_TABLET | Freq: Every day | ORAL | Status: DC
  Administered 2024-03-19 – 2024-03-21 (×3): 20 mg via ORAL
  Filled 2024-03-19 (×3): qty 1

## 2024-03-19 MED ORDER — LACTATED RINGERS IV SOLN: INTRAVENOUS | Status: DC

## 2024-03-19 MED ORDER — SODIUM CHLORIDE 0.9 % IV SOLN
2.0000 g | INTRAVENOUS | Status: DC
  Administered 2024-03-19: 2 g via INTRAVENOUS
  Filled 2024-03-19: qty 20

## 2024-03-19 MED ORDER — ACETAMINOPHEN 500 MG PO TABS: 1000.0000 mg | ORAL_TABLET | Freq: Four times a day (QID) | ORAL | Status: DC

## 2024-03-19 MED ORDER — INSULIN ASPART 100 UNIT/ML IJ SOLN
0.0000 [IU] | Freq: Three times a day (TID) | INTRAMUSCULAR | Status: DC
  Administered 2024-03-19: 1 [IU] via SUBCUTANEOUS
  Administered 2024-03-19: 3 [IU] via SUBCUTANEOUS
  Administered 2024-03-20 (×2): 2 [IU] via SUBCUTANEOUS
  Administered 2024-03-20 – 2024-03-21 (×3): 3 [IU] via SUBCUTANEOUS

## 2024-03-19 MED ORDER — ACETAMINOPHEN 500 MG PO TABS
1000.0000 mg | ORAL_TABLET | Freq: Four times a day (QID) | ORAL | Status: DC
  Administered 2024-03-19 – 2024-03-21 (×8): 1000 mg via ORAL
  Filled 2024-03-19 (×9): qty 2

## 2024-03-19 MED ORDER — ENOXAPARIN SODIUM 30 MG/0.3ML IJ SOSY
30.0000 mg | PREFILLED_SYRINGE | INTRAMUSCULAR | Status: DC
  Administered 2024-03-19 – 2024-03-21 (×3): 30 mg via SUBCUTANEOUS
  Filled 2024-03-19 (×3): qty 0.3

## 2024-03-19 MED ORDER — ONDANSETRON 4 MG PO TBDP
4.0000 mg | ORAL_TABLET | Freq: Three times a day (TID) | ORAL | Status: DC | PRN
  Administered 2024-03-20: 4 mg via ORAL
  Filled 2024-03-19: qty 1

## 2024-03-19 MED ORDER — CITALOPRAM HYDROBROMIDE 20 MG PO TABS
20.0000 mg | ORAL_TABLET | Freq: Every day | ORAL | Status: DC
  Administered 2024-03-19 – 2024-03-21 (×3): 20 mg via ORAL
  Filled 2024-03-19 (×3): qty 1

## 2024-03-19 MED ORDER — PHENAZOPYRIDINE HCL 100 MG PO TABS
100.0000 mg | ORAL_TABLET | Freq: Three times a day (TID) | ORAL | Status: DC | PRN
  Administered 2024-03-19 – 2024-03-20 (×2): 100 mg via ORAL
  Filled 2024-03-19 (×2): qty 1

## 2024-03-19 MED ORDER — SODIUM CHLORIDE 0.9 % IV SOLN: 1.0000 g | INTRAVENOUS | Status: DC

## 2024-03-19 NOTE — Consult Note (Cosign Needed Addendum)
 Urology Consult Note   Requesting Attending Physician:  Rosalynn Camie CROME, MD Service Providing Consult: Urology  Consulting Attending: Dr. Gaston   Reason for Consult:  pyelonephritis vs urothelial carcinoma  HPI: Shelby Brown is seen in consultation for reasons noted above at the request of Rosalynn Camie CROME, MD. Interview was completed with the aid of a video interpreter.  I was accompanied by her husband and Dr. Rosalynn.   Patient is a 43 year old Spanish-speaking female presenting to [facility] emergency department with abdominal pain and N/V.  PMH significant for T2DM, DKA, and MDD.  She has been in hospital multiple times, several of which are attributed to pyelonephritis.  Contrasted CT imaging noted luminal thickening of the bladder and ureter.  [facility] was consulted to speak to CT images in the context of recurrent pyelonephritis and enhancing right side urinary system.  On my arrival patient was alert, oriented, and in no distress.  She was sitting up in bed.  She was accompanied by her husband.  We briefly reviewed her previous hospitalizations and previous issues with pyelonephritis and how her CT findings are pertinent to her uncontrolled diabetes.  We reviewed case and plan and all questions were answered to her satisfaction.  ------------------  Assessment:   43 y.o. female with uncontrolled T2DM and possible cystitis/pyelonephritis   Recommendations: # R/L pyelonephritis/urothelial carcinoma # Large uterine fibroid  Addressing the questions specifically asked by the resident team, I do not have concern for malignancy at this time.  Considering her uncontrolled diabetes and the understood sequela of longstanding hyperglycemia, I would attribute her CT findings to possible neurogenic changes, infection, and remotely consider malignancy.    Reviewing her previous culture data, urinalysis, and imaging-the reported pyelonephritis was on the contralateral side and  I do not see evidence of on labs or imaging.  CT imaging frequently notes perinephric stranding as being consistent with pyelonephritis.  This can be, but is most frequently not the case.    Pyelonephritis is less likely considering her lack of systemic symptoms, very mild leukocytosis, and rare bacteria on urinalysis.  I have seen low bacteria count turned into notable infection in uncontrolled diabetic patients and we should await her culture data, but would have low threshold to discontinue antibiotics otherwise.  Her large uterine fibroid may be a player in her transient hydronephrosis and I would recommend that she follow-up with GYN.  No acute intervention recommended at this time.  If she continues to have complications once her A1c is under control, I would recommend follow-up with our [facility], [place], or [facility].  Will not need to follow during this admission.  Please call with questions or concerns.  Case and plan discussed with Dr. McDiarmid  Past Medical History: Past Medical History:  Diagnosis Date   Adjustment disorder with mixed anxiety and depressed mood 10/27/2017   AKI (acute kidney injury) (HCC) 03/27/2021   Chest pain 12/25/2023   Concern for acute stroke 12/27/2023   COVID-19 virus infection 09/01/2020   Diabetes mellitus without complication (HCC)    DKA (diabetic ketoacidosis) (HCC) 03/27/2021   Hyperosmolar hyperglycemic state (HHS) (HCC) 12/25/2023   Intentional metformin  overdose (HCC) 10/27/2017   Lobar pneumonia (HCC) 09/08/2020   Melena 11/17/2020   Pyelonephritis 11/16/2020    Past Surgical History:  Past Surgical History:  Procedure Laterality Date   CESAREAN SECTION     TUBAL LIGATION      Medication: Current Facility-Administered Medications  Medication Dose Route Frequency Provider  Last Rate Last Admin   acetaminophen  (TYLENOL ) tablet 1,000 mg  1,000 mg Oral Q6H Tharon Lung, MD   1,000 mg at 03/19/24 1220    cefTRIAXone  (ROCEPHIN ) 2 g in sodium chloride  0.9 % 100 mL IVPB  2 g Intravenous Q24H Hammons, Kimberly B, RPH       citalopram  (CELEXA ) tablet 20 mg  20 mg Oral Daily Tharon Lung, MD   20 mg at 03/19/24 9088   enoxaparin  (LOVENOX ) injection 30 mg  30 mg Subcutaneous Q24H Tharon Lung, MD   30 mg at 03/19/24 9088   famotidine  (PEPCID ) tablet 20 mg  20 mg Oral Daily Tharon Lung, MD   20 mg at 03/19/24 9088   insulin  aspart (novoLOG ) injection 0-9 Units  0-9 Units Subcutaneous TID WC Tharon Lung, MD   1 Units at 03/19/24 9087   lactated ringers  infusion   Intravenous Continuous Tharon Lung, MD 75 mL/hr at 03/19/24 0912 Infusion Verify at 03/19/24 0912   ondansetron  (ZOFRAN -ODT) disintegrating tablet 4 mg  4 mg Oral Q8H PRN Tharon Lung, MD        Allergies: No Known Allergies  Social History: Social History   Tobacco Use   Smoking status: Never   Smokeless tobacco: Never  Substance Use Topics   Alcohol use: No   Drug use: No    Family History Family History  Problem Relation Age of Onset   Cancer Father    Diabetes Sister    Diabetes Sister     Review of Systems  Genitourinary:  Negative for dysuria, flank pain, frequency, hematuria and urgency.     Objective   Vital signs in last 24 hours: BP 90/64 (BP Location: Right Arm)   Pulse 81   Temp 98.4 F (36.9 C)   Resp 18   Ht 5' 2 (1.575 m)   Wt 46.4 kg   SpO2 100%   BMI 18.71 kg/m   Physical Exam General: A&O, resting, appropriate HEENT: East /AT Pulmonary: Normal work of breathing Cardiovascular: no cyanosis Abdomen: Soft, NTTP, nondistended  Most Recent Labs: Lab Results  Component Value Date   WBC 10.8 (H) 03/19/2024   HGB 7.9 (L) 03/19/2024   HCT 24.9 (L) 03/19/2024   PLT 475 (H) 03/19/2024    Lab Results  Component Value Date   NA 136 03/19/2024   K 4.1 03/19/2024   CL 101 03/19/2024   CO2 26 03/19/2024   BUN 5 (L) 03/19/2024   CREATININE 0.74 03/19/2024   CALCIUM 7.9 (L) 03/19/2024   MG  1.8 03/13/2024   PHOS 4.1 12/26/2023    No results found for: INR, APTT   Urine Culture: @[id number] (laburin,org,[id number],[id number])@   IMAGING: CT ABDOMEN PELVIS W CONTRAST Result Date: 03/18/2024 CLINICAL DATA:  Acute nonlocalized abdominal pain, hypotension, low blood sugar, vomiting EXAM: CT ABDOMEN AND PELVIS WITH CONTRAST TECHNIQUE: Multidetector CT imaging of the abdomen and pelvis was performed using the standard protocol following bolus administration of intravenous contrast. RADIATION DOSE REDUCTION: This exam was performed according to the departmental dose-optimization program which includes automated exposure control, adjustment of the mA and/or kV according to patient size and/or use of iterative reconstruction technique. CONTRAST:  75mL OMNIPAQUE  IOHEXOL  350 MG/ML SOLN COMPARISON:  CT abdomen pelvis 03/13/2024 FINDINGS: Lower chest: No acute abnormality. Hepatobiliary: Unremarkable liver. Normal gallbladder. No biliary dilation. Pancreas: Unremarkable. Spleen: Unremarkable. Adrenals/Urinary Tract: Normal adrenal glands. Geographic hypoattenuation in the cortex of the mid and upper pole of the right kidney suspicious for pyelonephritis. Cortical renal scarring in the  left kidney. Urothelial thickening and hyperenhancement of the right ureter. No hydronephrosis. Diffuse bladder wall thickening and mucosal hyperenhancement with perivesical stranding. Stomach/Bowel: Normal caliber large and small bowel. No bowel wall thickening. The appendix is normal.Stomach is within normal limits. Vascular/Lymphatic: No significant vascular findings are present. No enlarged abdominal or pelvic lymph nodes. Reproductive: Fibroid in the uterus. Ovaries are unremarkable. Tubal ligation clips. Other: Small volume free fluid in the pelvis. No free intraperitoneal air. No abscess. Musculoskeletal: No acute fracture. IMPRESSION: Cystitis with right pyelonephritis. Electronically Signed   By: Norman Gatlin M.D.   On:  03/18/2024 21:43    ------  Ole Bourdon, NP Pager: [phone]   Please contact the urology consult pager with any further questions/concerns.

## 2024-03-19 NOTE — Assessment & Plan Note (Signed)
 A1c 14.6 on 6/27, using protamine - aspart 70/30 at home.  -sliding scale insulin , sensitive, adjust once PO intake improves

## 2024-03-19 NOTE — Progress Notes (Signed)
 Daily Progress Note Intern Pager: [phone]  Patient name: Shelby Brown Medical record number: [id number] Date of birth: [birth year] Age: 43 y.o. Gender: female  Primary Care Provider: Adele Song, MD Consultants: Urology Code Status: Full  Pt Overview and Major Events to Date:  - 7/1: Pt admitted - 7/2: Urology consulted  Medical Decision Making:  Shelby Brown is a 43 y.o. female presenting with abdominal pain and N/V 2/2 pyelonephritis. Pertinent PMH/PSH includes T2DM with hx DKA/HHS, MDD.   CTAP from 7/1 showing urothelial thickening of R ureter and diffuse bladder wall thickening, similar to CT in April. Given this and pt's recurrent pyelonephritis episodes, concern for possible obstruction/malignancy. Spoke with Shelby Brown, Urology PA, about the patient.  Assessment & Plan Pyelonephritis Hypovolemia Pt with continued abdominal pain, hypotension. Blood cultures with no growth < 12 hr. Urine culture pending. - Continue IV Ceftriaxone  1 g daily; consider transition to po once clinically improved - Continue IV fluids (LR at 75mL/hr); order to expire 7/3 at 0015, consider need for continued IV fluids - Continue to monitors Is/Os, po intake - Continue to follow blood and urine cultures - Zofran  4 mg prn for nausea Abnormal results on imaging study of genitourinary system CT done 7/1 with bladder and R ureter thickening, similar to 12/2023 CT.  - Urology consulted, see above Type 2 diabetes mellitus without complications (HCC) Currently on sensitive SSI. Home regimen includes protamine-aspart 70/30. Ha1c 14.6 on 6/27. - Resume basal insulin  once po intake improves Chronic health problem GERD: Continue famotidine  MDD: Continue citalopram    FEN/GI: Carb modified diet PPx: Lovenox  Dispo: Pending clinical improvement  Subjective:  Pt was seen with a Spanish interpreter present for the entirety of the visit. Today, pt reports continued suprapubic  tenderness, described as burning, and nausea. Maybe a little worse from yesterday. Denies dizziness, lightheadedness, and has gotten up to use the bathroom without issue. Has not eaten much given low appetite.  Objective: Temp:  [98 F (36.7 C)-98.6 F (37 C)] 98.4 F (36.9 C) (07/02 0427) Pulse Rate:  [80-102] 81 (07/02 0452) Resp:  [5-21] 18 (07/02 0427) BP: (83-144)/(57-89) 90/64 (07/02 0452) SpO2:  [96 %-100 %] 100 % (07/02 0427) Weight:  [46.4 kg] 46.4 kg (07/01 1831) Physical Exam: General: Pt is sitting up in bed, no acute distress. Cardiovascular: Regular rate and rhythm, no murmurs/rubs/gallops. Respiratory: Clear to auscultation bilaterally, normal work of breathing on room air. No wheezes, crackles. Abdomen: Normoactive bowel sounds bilaterally. Soft, nondistended, suprapubic tenderness present. Mild CVA tenderness bilaterally, R>L. Extremities: Skin warm, dry. No BLE edema.  Laboratory: Most recent CBC Lab Results  Component Value Date   WBC 10.8 (H) 03/19/2024   HGB 7.9 (L) 03/19/2024   HCT 24.9 (L) 03/19/2024   MCV 84.1 03/19/2024   PLT 475 (H) 03/19/2024   Most recent BMP    Latest Ref Rng & Units 03/19/2024    6:28 AM  BMP  Glucose 70 - 99 mg/dL 862   BUN 6 - 20 mg/dL 5   Creatinine 9.55 - 8.99 mg/dL 9.25   Sodium 864 - 854 mmol/L 136   Potassium 3.5 - 5.1 mmol/L 4.1   Chloride 98 - 111 mmol/L 101   CO2 22 - 32 mmol/L 26   Calcium 8.9 - 10.3 mg/dL 7.9     Shelby Palma, MD 03/19/2024, 7:35 AM  PGY-1, [facility] FPTS Intern pager: [phone], text pages welcome Secure chat group [facility] Teaching Service

## 2024-03-19 NOTE — Plan of Care (Signed)
 Spoke to [facility] on call NP, Ole Bourdon, who reviewed patient's medical record and evaluated previous diagnosis of pyelonephritis.  States that prior pyelo diagnoses April 2005, March 2025 were likely not true pyelonephritis given urine culture not supportive.  Reviewed CT findings this admission, notes large right-sided fibroid, suspicious that this may have contributed to retention and subsequent development of right pyelonephritis, suggested outpatient gyn follow up.  Also notes A1c greater than 15, suspects this is primary etiology for recurrent UTIs/pyelo.  Sees no room for scope for urology intervention at this time, recommends addressing diabetes management and outpatient urology follow-up if recurrent UTIs continue.  Discussed urothelial thickening and hyperenhancement of right ureter, Ole Bourdon did not recommend intervention given above issues.

## 2024-03-19 NOTE — Plan of Care (Signed)
  Problem: Education: Goal: Ability to describe self-care measures that may prevent or decrease complications (Diabetes Survival Skills Education) will improve Outcome: Progressing   Problem: Coping: Goal: Ability to adjust to condition or change in health will improve Outcome: Progressing   Problem: Fluid Volume: Goal: Ability to maintain a balanced intake and output will improve Outcome: Progressing   Problem: Health Behavior/Discharge Planning: Goal: Ability to identify and utilize available resources and services will improve Outcome: Progressing Goal: Ability to manage health-related needs will improve Outcome: Progressing   Problem: Metabolic: Goal: Ability to maintain appropriate glucose levels will improve Outcome: Progressing   Problem: Nutritional: Goal: Maintenance of adequate nutrition will improve Outcome: Progressing Goal: Progress toward achieving an optimal weight will improve Outcome: Progressing   Problem: Skin Integrity: Goal: Risk for impaired skin integrity will decrease Outcome: Progressing   Problem: Tissue Perfusion: Goal: Adequacy of tissue perfusion will improve Outcome: Progressing   Problem: Education: Goal: Knowledge of General Education information will improve Description: Including pain rating scale, medication(s)/side effects and non-pharmacologic comfort measures Outcome: Progressing   Problem: Health Behavior/Discharge Planning: Goal: Ability to manage health-related needs will improve Outcome: Progressing   Problem: Clinical Measurements: Goal: Ability to maintain clinical measurements within normal limits will improve Outcome: Progressing Goal: Will remain free from infection Outcome: Progressing Goal: Diagnostic test results will improve Outcome: Progressing Goal: Respiratory complications will improve Outcome: Progressing Goal: Cardiovascular complication will be avoided Outcome: Progressing   Problem: Activity: Goal:  Risk for activity intolerance will decrease Outcome: Progressing   Problem: Elimination: Goal: Will not experience complications related to bowel motility Outcome: Progressing Goal: Will not experience complications related to urinary retention Outcome: Progressing   Problem: Pain Managment: Goal: General experience of comfort will improve and/or be controlled Outcome: Progressing

## 2024-03-19 NOTE — TOC CM/SW Note (Signed)
 Transition of Care [facility]) - Inpatient Brief Assessment   Patient Details  Name: Shelby Brown MRN: [id number] Date of Birth: 1980/11/16  Transition of [facility] CM/SW Contact:    Lauraine FORBES Saa, LCSW Phone Number: 03/19/2024, 10:18 AM   Clinical Narrative:  10:18 AM Per chart review, patient resides at home with spouse and child(ren). Patient has a PCP but does not have insurance. CSW consulted financial counseling for further assistance. Patient does not have SNF/HH/DME history. Patient's preferred pharmacy's are [facility] [address], [facility], and [facility]. CSW provided SDOH (housing, transportation, utilities) resources. No other TOC needs were identified at this time. TOC will continue to follow and be available to assist.  Transition of Care Asessment: Insurance and Status: Selfpay Patient has primary care physician: Yes Home environment has been reviewed: Private Residence Prior level of function:: N/A Prior/Current Home Services: No current home services Social Drivers of Health Review: SDOH reviewed interventions complete Readmission risk has been reviewed: Yes Transition of care needs: no transition of care needs at this time

## 2024-03-19 NOTE — Assessment & Plan Note (Addendum)
 Patient presented hypotensive and hypoglycemic with suprapubic and R flank pain. Initial concern for DKA given patient's history, but lack of hyperglycemia and subsequent evidence of cystitis and pyelonephritis on CT abd/pelvis point to pyelonephritis as cause of patient's abdominal symptoms. UA and culture pending.  -Admit to FMTS for IV fluids and antibiotics given significant nausea and vomiting, attending Dr Donzetta.  -Ceftriaxone  1g IV daily. Consider broadening if clinical condition worsens prior to culture resulting.  -Await UA and Ucx -Zofran  4mg  ODT PRN for nausea -Urothelial thickening appreciated on CTAP, which in the context of recent similar presentation of negative urine culture could indicate other pathology such as malignancy, consider urology consult if not improving

## 2024-03-19 NOTE — Telephone Encounter (Signed)
 Reviewed and agree with Dr Macky Lower plan.

## 2024-03-19 NOTE — Assessment & Plan Note (Signed)
>>  ASSESSMENT AND PLAN FOR CONTROLLED TYPE 2 DIABETES MELLITUS WITH COMPLICATION, WITH LONG-TERM CURRENT USE OF INSULIN  (HCC) WRITTEN ON 03/19/2024 12:47 AM BY MABE, GERALD, MD  A1c 14.6 on 6/27, using protamine - aspart 70/30 at home.  -sliding scale insulin , sensitive, adjust once PO intake improves

## 2024-03-19 NOTE — Assessment & Plan Note (Signed)
 Patient was sent to the ED from her PCP clinic due to hypotension and hypoglycemia, likely secondary to pyelonephritis and associated n/v and poor PO intake.  -S/P 1L bolus in the ED -IV LR 75ml/hr -Monitor I/Os and PO intake

## 2024-03-19 NOTE — Assessment & Plan Note (Addendum)
 Pt with continued abdominal pain, hypotension. Blood cultures with no growth < 12 hr. Urine culture pending. - Continue IV Ceftriaxone  1 g daily; consider transition to po once clinically improved - Continue IV fluids (LR at 75mL/hr); order to expire 7/3 at 0015, consider need for continued IV fluids - Continue to monitors Is/Os, po intake - Continue to follow blood and urine cultures - Zofran  4 mg prn for nausea

## 2024-03-19 NOTE — Assessment & Plan Note (Signed)
 CT done 7/1 with bladder and R ureter thickening, similar to 12/2023 CT.  - Urology consulted, see above

## 2024-03-19 NOTE — Progress Notes (Signed)
 The patient's CBG at time of arrival to unit was 63, and she was asymptomatic. The hypoglycemia protocol was initiated, and the patient had 2 containers of juice. The CBG 15 minutes after intervention was 127. The family medicine on call, Dr Elna Redo was notified

## 2024-03-19 NOTE — ED Notes (Signed)
 PT's CBG checked. Physician Darryle Junes made aware that PT CBG was 66, no new orders at this time.

## 2024-03-19 NOTE — Assessment & Plan Note (Signed)
 GERD: Continue famotidine  MDD: Continue citalopram 

## 2024-03-19 NOTE — Assessment & Plan Note (Signed)
 Currently on sensitive SSI. Home regimen includes protamine-aspart 70/30. Ha1c 14.6 on 6/27. - Resume basal insulin  once po intake improves

## 2024-03-19 NOTE — Plan of Care (Signed)
  Problem: Education: Goal: Ability to describe self-care measures that may prevent or decrease complications (Diabetes Survival Skills Education) will improve Outcome: Progressing Goal: Individualized Educational Video(s) Outcome: Progressing   Problem: Coping: Goal: Ability to adjust to condition or change in health will improve Outcome: Progressing   Problem: Nutritional: Goal: Maintenance of adequate nutrition will improve Outcome: Progressing Goal: Progress toward achieving an optimal weight will improve Outcome: Progressing

## 2024-03-19 NOTE — Assessment & Plan Note (Signed)
>>  ASSESSMENT AND PLAN FOR CONTROLLED TYPE 2 DIABETES MELLITUS WITH COMPLICATION, WITH LONG-TERM CURRENT USE OF INSULIN  (HCC) WRITTEN ON 03/19/2024  7:53 AM BY NEMECEK, AMANDA, MD  Currently on sensitive SSI. Home regimen includes protamine-aspart 70/30. Ha1c 14.6 on 6/27. - Resume basal insulin  once po intake improves

## 2024-03-19 NOTE — Inpatient Diabetes Management (Signed)
 Inpatient Diabetes Program Recommendations  AACE/ADA: New Consensus Statement on Inpatient Glycemic Control (2015)  Target Ranges:  Prepandial:   less than 140 mg/dL      Peak postprandial:   less than 180 mg/dL (1-2 hours)      Critically ill patients:  140 - 180 mg/dL   Lab Results  Component Value Date   GLUCAP 115 (H) 03/19/2024   HGBA1C 14.6 (H) 03/14/2024    Review of Glycemic Control  Latest Reference Range & Units 03/18/24 17:13 03/18/24 18:57 03/18/24 22:53 03/19/24 01:23 03/19/24 02:00 03/19/24 08:00 03/19/24 12:03  Glucose-Capillary 70 - 99 mg/dL 76 875 (H) 66 (L) 63 (L) 127 (H) 121 (H) 115 (H)   Diabetes history: DM 2 Outpatient Diabetes medications:  Novolog  70/30-7 units bid Current orders for Inpatient glycemic control:  Novolog  0-9 units tid with meals  Inpatient Diabetes Program Recommendations:    Called and spoke with patient's daughter [name] by phone.  She has been helping administer her Mom's insulin  and checking blood sugars.  She states that they have 2 pens of 70/30.  Discussed that insulin  70/30  needs to be given with food/meal to prevent hypoglycemia.  She verbalized understanding.  We also reviewed hypoglycemia- signs, symptoms and how to treat.  Visited patient as well and told her and husband that I spoke with their daughter about DM.  Will follow.   Thanks,  Randall Bullocks, RN, BC-ADM Inpatient Diabetes Coordinator Pager [phone]  (8a-5p)

## 2024-03-20 ENCOUNTER — Telehealth: Payer: Self-pay | Admitting: Pharmacist

## 2024-03-20 ENCOUNTER — Encounter: Payer: Self-pay | Admitting: Family Medicine

## 2024-03-20 ENCOUNTER — Other Ambulatory Visit (HOSPITAL_COMMUNITY): Payer: Self-pay

## 2024-03-20 ENCOUNTER — Other Ambulatory Visit: Payer: Self-pay

## 2024-03-20 DIAGNOSIS — N3090 Cystitis, unspecified without hematuria: Secondary | ICD-10-CM

## 2024-03-20 DIAGNOSIS — E118 Type 2 diabetes mellitus with unspecified complications: Secondary | ICD-10-CM

## 2024-03-20 DIAGNOSIS — E162 Hypoglycemia, unspecified: Secondary | ICD-10-CM

## 2024-03-20 LAB — CBC
HCT: 23.9 % — ABNORMAL LOW (ref 36.0–46.0)
Hemoglobin: 7.6 g/dL — ABNORMAL LOW (ref 12.0–15.0)
MCH: 27 pg (ref 26.0–34.0)
MCHC: 31.8 g/dL (ref 30.0–36.0)
MCV: 85.1 fL (ref 80.0–100.0)
Platelets: 486 10*3/uL — ABNORMAL HIGH (ref 150–400)
RBC: 2.81 MIL/uL — ABNORMAL LOW (ref 3.87–5.11)
RDW: 12.9 % (ref 11.5–15.5)
WBC: 8 10*3/uL (ref 4.0–10.5)
nRBC: 0 % (ref 0.0–0.2)

## 2024-03-20 LAB — URINE CULTURE: Culture: >=100000 — AB

## 2024-03-20 LAB — BASIC METABOLIC PANEL WITH GFR
Anion gap: 7 (ref 5–15)
BUN: 13 mg/dL (ref 6–20)
CO2: 27 mmol/L (ref 22–32)
Calcium: 8 mg/dL — ABNORMAL LOW (ref 8.9–10.3)
Chloride: 99 mmol/L (ref 98–111)
Creatinine, Ser: 0.98 mg/dL (ref 0.44–1.00)
GFR, Estimated: >60 mL/min (ref >60)
Glucose, Bld: 240 mg/dL — ABNORMAL HIGH (ref 70–99)
Potassium: 4.4 mmol/L (ref 3.5–5.1)
Sodium: 133 mmol/L — ABNORMAL LOW (ref 135–145)

## 2024-03-20 LAB — GLUCOSE, CAPILLARY
Glucose-Capillary: 218 mg/dL — ABNORMAL HIGH (ref 70–99)
Glucose-Capillary: 240 mg/dL — ABNORMAL HIGH (ref 70–99)
Glucose-Capillary: 196 mg/dL — ABNORMAL HIGH (ref 70–99)
Glucose-Capillary: 132 mg/dL — ABNORMAL HIGH (ref 70–99)
Glucose-Capillary: 125 mg/dL — ABNORMAL HIGH (ref 70–99)

## 2024-03-20 MED ORDER — INSULIN GLARGINE 100 UNITS/ML SOLOSTAR PEN: 5.0000 [IU] | PEN_INJECTOR | SUBCUTANEOUS | Status: DC

## 2024-03-20 MED ORDER — CEPHALEXIN 500 MG PO CAPS
500.0000 mg | ORAL_CAPSULE | Freq: Two times a day (BID) | ORAL | Status: DC
  Administered 2024-03-20 – 2024-03-21 (×2): 500 mg via ORAL
  Filled 2024-03-20 (×2): qty 1

## 2024-03-20 MED ORDER — PHENAZOPYRIDINE HCL 100 MG PO TABS
100.0000 mg | ORAL_TABLET | Freq: Three times a day (TID) | ORAL | 0 refills | Status: AC | PRN
  Filled 2024-03-20 (×2): qty 30, 10d supply, fill #0

## 2024-03-20 MED ORDER — CEPHALEXIN 500 MG PO CAPS
500.0000 mg | ORAL_CAPSULE | Freq: Two times a day (BID) | ORAL | 0 refills | Status: AC
  Filled 2024-03-20: qty 7, 4d supply, fill #0

## 2024-03-20 MED ORDER — BASAGLAR KWIKPEN 100 UNIT/ML ~~LOC~~ SOPN
5.0000 [IU] | PEN_INJECTOR | Freq: Every day | SUBCUTANEOUS | 0 refills | Status: DC
  Filled 2024-03-20: qty 3, 28d supply, fill #0

## 2024-03-20 MED ORDER — INSULIN ASPART 100 UNIT/ML IJ SOLN
5.0000 [IU] | Freq: Once | INTRAMUSCULAR | Status: AC
  Administered 2024-03-20: 5 [IU] via SUBCUTANEOUS

## 2024-03-20 MED ORDER — INSULIN GLARGINE-YFGN 100 UNIT/ML ~~LOC~~ SOLN
5.0000 [IU] | Freq: Every day | SUBCUTANEOUS | Status: DC
  Administered 2024-03-20 – 2024-03-21 (×2): 5 [IU] via SUBCUTANEOUS
  Filled 2024-03-20 (×2): qty 0.05

## 2024-03-20 NOTE — Progress Notes (Signed)
 Daily Progress Note Intern Pager: [phone]  Patient name: Shelby Brown Medical record number: [id number] Date of birth: [birth year] Age: 43 y.o. Gender: female  Primary Care Provider: Adele Song, MD Consultants: Urology (signed off) Code Status: Full  Pt Overview and Major Events to Date:  - 7/1: Pt admitted - 7/2: Urology consulted, signed off  Medical Decision Making:  Shelby Brown is a 43 y.o. female presenting with abdominal pain and N/V 2/2 UTI. Pertinent PMH/PSH includes T2DM with hx DKA/HHS, MDD.   Initially, pt thought to have pyelonephritis based on CT from 7/1. Urology consulted, and believed that CT findings of ureter and bladder wall thickening were moreso related to pt's uncontrolled T2DM; also has a large uterine fibroid which could be contributing to transient hydronephrosis. Blood cultures with no growth 1 day. Urine culture with >100,000 colonies gram negative rods (susceptibilities following), suggestive of UTI in setting of pt's clinical symptoms of suprapubic abdominal pain and nausea improved by pyridium. WBC improved on abx, will continue as po. Assessment & Plan Urinary tract infection Hypovolemia (Resolved: 03/20/2024) Pt tolerating po well; will switch IV abx to po abx to treat UTI. - Stop IV Ceftriaxone  - Start Keflex  500 mg BID for 7 days of total abx, to start tonight with last dose 7/7 - Continue to monitor Is/Os, PO intake - Continue to follow blood and urine culture - Nausea regimen: Zofran  4 mg prn - Pain regimen: Tylenol  1000 mg q6h, pyridium 100 mg TID prn Type 2 diabetes mellitus without complications (HCC) Home regimen of protamine-aspart 70/30. Ha1c 14.6 on 6/27. Currently just on SSI while inpatient. Glucoses now in the 200s with good po intake. Pharmacy recommending pt fill out an income form with [facility] at [place] to then receive glargine for a year. - Start glargine 5 units daily; starting at  smaller dose given pt admitted with hypoglycemia - Continue SSI - Will refer to Dr. Koval for [place] study Abnormal results on imaging study of genitourinary system Low concern for malignancy at this time; thought to be sequelae of uncontrolled diabetes. - Recommend following up with GYN outpatient to address possible contribution from large uterine fibroid Chronic health problem GERD: Continue famotidine  20 mg daily MDD: Continue citalopram  20 mg daily   FEN/GI: Carb modified diet PPx: Lovenox  Dispo: Home tomorrow. Barriers include switching to po abx, restarting basal insulin   Will plan for PCP appointment on Monday 7/7.  Subjective:  Overnight, pt experienced continued abdominal pain, improved with prn pyridium.  Today, pt reports she is feeling improved. She continues to report some abdominal discomfort, which she relates more to being hungry, and which is improved from yesterday. She is eager to head home soon. No other concerns/complaints.  Objective: Temp:  [97.9 F (36.6 C)-98.3 F (36.8 C)] 97.9 F (36.6 C) (07/03 0724) Pulse Rate:  [81-84] 81 (07/03 0724) Resp:  [19-20] 20 (07/03 0724) BP: (104-152)/(66-85) 111/68 (07/03 0724) SpO2:  [97 %-100 %] 97 % (07/03 0724) Physical Exam: General: Pt is sitting up in bed, no acute distress. Cardiovascular: Regular rate and rhythm, no murmurs/rubs/gallops. Respiratory: Clear to auscultation bilaterally, normal work of breathing on room air. No wheezes, crackles. Abdomen: Bowel sounds present and normoactive bilaterally. Mild LLQ abdominal tenderness, no rebound, no guarding. Soft, nondistended. Extremities: Skin warm, dry. No bilateral lower extremity edema.  Laboratory: Most recent CBC Lab Results  Component Value Date   WBC 8.0 03/20/2024   HGB 7.6 (L) 03/20/2024   HCT 23.9 (  L) 03/20/2024   MCV 85.1 03/20/2024   PLT 486 (H) 03/20/2024   Most recent BMP    Latest Ref Rng & Units 03/20/2024    6:11 AM  BMP  Glucose 70  - 99 mg/dL 759   BUN 6 - 20 mg/dL 13   Creatinine 9.55 - 1.00 mg/dL 9.01   Sodium 864 - 854 mmol/L 133   Potassium 3.5 - 5.1 mmol/L 4.4   Chloride 98 - 111 mmol/L 99   CO2 22 - 32 mmol/L 27   Calcium 8.9 - 10.3 mg/dL 8.0    Urine culture: >899,999 colonies of gram negative rods on preliminary read   Shelby Palma, MD 03/20/2024, 11:26 AM  PGY-1, [facility] FPTS Intern pager: [phone], text pages welcome Secure chat group [facility] Teaching Service

## 2024-03-20 NOTE — Assessment & Plan Note (Signed)
 GERD: Continue famotidine  20 mg daily MDD: Continue citalopram  20 mg daily

## 2024-03-20 NOTE — Assessment & Plan Note (Signed)
 Low concern for malignancy at this time; thought to be sequelae of uncontrolled diabetes. - Recommend following up with GYN outpatient to address possible contribution from large uterine fibroid

## 2024-03-20 NOTE — Plan of Care (Signed)
  Problem: Coping: Goal: Ability to adjust to condition or change in health will improve Outcome: Progressing   Problem: Nutritional: Goal: Maintenance of adequate nutrition will improve Outcome: Progressing Goal: Progress toward achieving an optimal weight will improve Outcome: Progressing   Problem: Education: Goal: Knowledge of General Education information will improve Description: Including pain rating scale, medication(s)/side effects and non-pharmacologic comfort measures Outcome: Progressing

## 2024-03-20 NOTE — Assessment & Plan Note (Addendum)
 Pt tolerating po well; will switch IV abx to po abx to treat UTI. - Stop IV Ceftriaxone  - Start Keflex  500 mg BID for 7 days of total abx, to start tonight with last dose 7/7 - Continue to monitor Is/Os, PO intake - Continue to follow blood and urine culture - Nausea regimen: Zofran  4 mg prn - Pain regimen: Tylenol  1000 mg q6h, pyridium 100 mg TID prn

## 2024-03-20 NOTE — Progress Notes (Signed)
 Pharmacy - Insulin  Update  Patient is able to receive Basiglar insulin  through the [facility] program at the [facility]. She will need to fill out the income form when she obtains next refill. Added pharmacy, address, and phone number to discharge instructions.  Spoke with patient initially through an interpreter and then went back to speak with patient and daughter. She is to stop the Novolog  70/30 and start Basiglar. Relayed it is important to keep her follow-up appt with [facility].  Thank you for involving pharmacy in this patient's care.  Delon Sax, PharmD, BCPS Clinical Pharmacist Clinical phone for 03/20/2024 is [phone] 03/20/2024 12:38 PM

## 2024-03-20 NOTE — Telephone Encounter (Signed)
 Reviewed and agree with Dr Macky Lower plan.

## 2024-03-20 NOTE — Telephone Encounter (Signed)
-----   Message from [place] Danielle sent at 03/20/2024 11:46 AM EDT ----- I spoke with April from the [facility]. She was not set up for [facility] previously but absolutely can be. They have a good supply of Basiglar currently. FM inpatient team has sent over an Rx to [facility] location and she will have Armed forces training and education officer for discharge tomorrow. ##Patient will need to go fill out their income form to receive more insulin .## If she does this, she will be set up for 1 year to get insulin .  Jeralyn - I think she will have a f/u visit on 7/7 if you would like to see her.  Lavern - maybe you can set her up with patient assistance for insulin  in the next few months? She will need something for when [facility] program ends.  Thanks for both your help, Jenn

## 2024-03-20 NOTE — Telephone Encounter (Signed)
 Inpatient pharmacist collaborated to set-up [facility] for insulin .   Camille Everette, CPhT in contact to assist with MAP application and DOH application support.   Total time with patient call and documentation of interaction: 11 minutes.

## 2024-03-20 NOTE — Plan of Care (Signed)

## 2024-03-20 NOTE — Inpatient Diabetes Management (Signed)
 Inpatient Diabetes Program Recommendations  AACE/ADA: New Consensus Statement on Inpatient Glycemic Control (2015)  Target Ranges:  Prepandial:   less than 140 mg/dL      Peak postprandial:   less than 180 mg/dL (1-2 hours)      Critically ill patients:  140 - 180 mg/dL    Latest Reference Range & Units 03/18/24 16:15 03/18/24 17:13 03/18/24 18:57 03/18/24 22:53 03/19/24 01:23 03/19/24 02:00  Glucose-Capillary 70 - 99 mg/dL 61 (L) 76 875 (H) 66 (L) 63 (L) 127 (H)    Latest Reference Range & Units 03/19/24 08:00 03/19/24 12:03 03/19/24 16:13 03/19/24 19:58  Glucose-Capillary 70 - 99 mg/dL 878 (H)  1 unit Novolog   115 (H) 220 (H)  3 units Novolog  @1849  251 (H)  (H): Data is abnormally high  Latest Reference Range & Units 03/20/24 06:33 03/20/24 07:22  Glucose-Capillary 70 - 99 mg/dL 759 (H)  5 units Novolog   218 (H)  (H): Data is abnormally high     Home DM Meds:  Novolog  70/30 Insulin  7 units BID  Current Orders:  Novolog  Sensitive Correction Scale/ SSI (0-9 units) TID AC     MD- Note CBG 240 this AM  May consider starting low dose basal insulin : Semglee  4 units daily (0.1 units/kg)  For affordability, would resume home 70/30 Insulin  at time of discharge    --Will follow patient during hospitalization--  Adina Rudolpho Arrow RN, MSN, CDCES Diabetes Coordinator Inpatient Glycemic Control Team Team Pager: [phone] (8a-5p)

## 2024-03-20 NOTE — Assessment & Plan Note (Addendum)
 Home regimen of protamine-aspart 70/30. Ha1c 14.6 on 6/27. Currently just on SSI while inpatient. Glucoses now in the 200s with good po intake. Pharmacy recommending pt fill out an income form with [facility] at [place] to then receive glargine for a year. - Start glargine 5 units daily; starting at smaller dose given pt admitted with hypoglycemia - Continue SSI - Will refer to Dr. Koval for [place] study

## 2024-03-20 NOTE — Assessment & Plan Note (Signed)
>>  ASSESSMENT AND PLAN FOR CONTROLLED TYPE 2 DIABETES MELLITUS WITH COMPLICATION, WITH LONG-TERM CURRENT USE OF INSULIN  (HCC) WRITTEN ON 03/20/2024 11:28 AM BY NEMECEK, AMANDA, MD  Home regimen of protamine-aspart 70/30. Ha1c 14.6 on 6/27. Currently just on SSI while inpatient. Glucoses now in the 200s with good po intake. Pharmacy recommending pt fill out an income form with [facility] at [place] to then receive glargine for a year. - Start glargine 5 units daily; starting at smaller dose given pt admitted with hypoglycemia - Continue SSI - Will refer to Dr. Koval for [place] study

## 2024-03-20 NOTE — Discharge Instructions (Addendum)
 Shelby Brown,  Thank you for letting us  care for you during your stay.  You were admitted to the [facility] Medicine Teaching Service for low blood pressure and abdominal pain with nausea and vomiting. Your abdominal pain was most likely because of a urinary tract infection, which was treated with antibiotics.   You also had low blood sugars when you first came to the hospital, and so we did not give you your home insulin . Your blood sugars increased, and so we started you on a new insulin  called Glargine, which you will continue when you leave the hospital.   There are a few things for you to do when you leave the hospital: Continue to take your antibiotic, Keflex , 500 mg two times a day; your last dose of this will be Monday July 7th.  We changed your insulin  type to Glargine. Take 5 units of this once a day. DO NOT take your old insulin  To get more Glargine, please go to [facility] at [facility]  Please follow up with your primary care physician on Thurs July 10th at 8:30am  If your symptoms worsen or return, please return to the hospital.  Please let us  know if you have questions about your stay at [facility].    __________  Shelby Fleischer,  Gracias por permitirnos atenderle durante su estancia.  Ingres en el [facility] [facility] por presin arterial baja y dolor abdominal con nuseas y vmitos. Su dolor abdominal probablemente se debi a una infeccin del tracto urinario, que se trat con antibiticos.  Tambin present niveles bajos de azcar en sangre al ingresar al hospital, por lo que no le administramos su insulina domiciliaria. Su nivel de azcar en sangre aument, por lo que iniciamos el tratamiento con una nueva insulina llamada [place]na, que continuar al salir del hospital.  Al salir del hospital, debe seguir algunas medidas: 1. Contine tomando su antibitico, Keflex , 500 mg dos veces al da; su  ltima dosis ser el lunes 7 de julio. 2. Le cambiamos el tipo de guam a Glargina. Tome 5 unidades de esta una vez al [employer]. NO use su insulina anterior. 3. Para obtener ms [place], acuda a la [facility] en [employer] [facility] [employer]. Por favor, recoja su recarga de Manufacturing systems engineer en: [facility] [address] [place] [address], [place], [place] [address] Phone: [phone]  Consulte con su mdico de cabecera el jueves 10 de julio a las 8:30 a. m.  Si sus sntomas empeoran o reaparecen, regrese al hospital.  Si tiene alguna pregunta sobre su estancia en [facility], infrmenos.

## 2024-03-21 ENCOUNTER — Ambulatory Visit: Payer: Self-pay | Admitting: Family Medicine

## 2024-03-21 DIAGNOSIS — N12 Tubulo-interstitial nephritis, not specified as acute or chronic: Principal | ICD-10-CM

## 2024-03-21 DIAGNOSIS — I959 Hypotension, unspecified: Secondary | ICD-10-CM

## 2024-03-21 LAB — BASIC METABOLIC PANEL WITH GFR
Anion gap: 10 (ref 5–15)
BUN: 15 mg/dL (ref 6–20)
CO2: 27 mmol/L (ref 22–32)
Calcium: 8.2 mg/dL — ABNORMAL LOW (ref 8.9–10.3)
Chloride: 98 mmol/L (ref 98–111)
Creatinine, Ser: 0.82 mg/dL (ref 0.44–1.00)
GFR, Estimated: >60 mL/min (ref >60)
Glucose, Bld: 255 mg/dL — ABNORMAL HIGH (ref 70–99)
Potassium: 4.5 mmol/L (ref 3.5–5.1)
Sodium: 135 mmol/L (ref 135–145)

## 2024-03-21 LAB — CBC
HCT: 25.3 % — ABNORMAL LOW (ref 36.0–46.0)
Hemoglobin: 8 g/dL — ABNORMAL LOW (ref 12.0–15.0)
MCH: 27 pg (ref 26.0–34.0)
MCHC: 31.6 g/dL (ref 30.0–36.0)
MCV: 85.5 fL (ref 80.0–100.0)
Platelets: 569 K/uL — ABNORMAL HIGH (ref 150–400)
RBC: 2.96 MIL/uL — ABNORMAL LOW (ref 3.87–5.11)
RDW: 13.1 % (ref 11.5–15.5)
WBC: 9.3 K/uL (ref 4.0–10.5)
nRBC: 0 % (ref 0.0–0.2)

## 2024-03-21 LAB — GLUCOSE, CAPILLARY
Glucose-Capillary: 221 mg/dL — ABNORMAL HIGH (ref 70–99)
Glucose-Capillary: 207 mg/dL — ABNORMAL HIGH (ref 70–99)

## 2024-03-21 NOTE — TOC Transition Note (Signed)
 Transition of Care [facility]) - Discharge Note   Patient Details  Name: Sakai Heinle MRN: [id number] Date of Birth: Jan 01, 1981  Transition of Care [facility]) CM/SW Contact:  Andrez JULIANNA George, RN Phone Number: 03/21/2024, 11:11 AM   Clinical Narrative:     Pt is discharging home with self care. Medications filled through TOC except insulin . This is filled by [facility]. Pt and daughter aware to pick it up there and that it is being filled through the  [facility] program per pharmacy.  Pt has transportation home.   Final next level of care: Home/Self Care Barriers to Discharge: Inadequate or no insurance, Barriers Unresolved (comment)   Patient Goals and CMS Choice            Discharge Placement                       Discharge Plan and Services Additional resources added to the After Visit Summary for                                       Social Drivers of Health (SDOH) Interventions SDOH Screenings   Food Insecurity: No Food Insecurity (03/19/2024)  Housing: High Risk (03/19/2024)  Transportation Needs: Unmet Transportation Needs (03/19/2024)  Utilities: At Risk (03/19/2024)  Alcohol Screen: Low Risk  (11/01/2017)  Depression (PHQ2-9): High Risk (03/18/2024)  Tobacco Use: Low Risk  (03/19/2024)     Readmission Risk Interventions    12/28/2023    3:39 PM  Readmission Risk Prevention Plan  Post Dischage Appt Complete  Medication Screening Complete  Transportation Screening Complete

## 2024-03-21 NOTE — Discharge Summary (Addendum)
 Family Medicine Teaching [facility] Discharge Summary  Patient name: Shelby Brown Medical record number: [id number] Date of birth: [birth year] Age: 43 y.o. Gender: female Date of Admission: 03/18/2024  Date of Discharge: 03/21/2024 Admitting Physician: Rollene FORBES Keeling, MD  Primary Care Provider: Adele Song, MD Consultants: Urology  Indication for Hospitalization: UTI  Discharge Diagnoses/Problem List:  Principal Problem for Admission: UTI Other Problems addressed during stay:  Active Problems:   Controlled type 2 diabetes mellitus with complication, with long-term current use of insulin  (HCC)   Urinary tract infection   Abnormal results on imaging study of genitourinary system   Cystitis   Hypoglycemia   Brief Hospital Course:  Shelby Brown is a 43 y.o. female who was admitted to the [facility] Medicine Teaching Service at [facility] for abdominal pain and hypovolemia secondary to UTI.  Hospital course is outlined below by problem.  UTI Presented after multiple days of abdominal pain, nausea, and vomiting to PCP office and found to be hypotensive with concurrent hypoglycemia.  Sent to ED for further evaluation, at which time CTAP showed cystitis and possible right pyelonephritis.  She was started on ceftriaxone  and IV fluids.  Urology was consulted regarding CT scan findings, and upon reviewing imaging and speaking with patient, suspected findings were 2/2 uncontrolled DM rather than pyelonephritis as explained below.  She was transitioned to oral antibiotics the following day with cephalexin  (7/3-7/7) for total 7 day course.  Chronic urothelial thickening CTAP 7/1 showing urothelial thickening of R ureter and diffuse bladder wall thickening, similar to CT in April.  Given concern for possible malignancy or anatomic abnormality, Urology was consulted.  Specialist suspected thickening was due to local effects of fibroid versus chronic hyperglycemia and on review of chart  was not certain previous pyelonephritis diagnosis was accurate.  Urology recommended improving A1c control and signed off.  T2DM A1c previously 14.6.  She reported compliance with 70/30 insulin  at home.  She was started on sliding scale insulin , and on 7/3 basal insulin  was added with 5 units of glargine.  At discharge, patient received glargine pen through [facility] as part of financial aid process.  She will need to fill out financial aid paperwork when she goes to [facility] for refill.  Other conditions that were chronic and stable, treated with home meds or formulary equivalents: GERD, MDD  Issues for follow up: Ensure insulin  regimen compliance; have pt sign income form with [facility] to continue receiving glargine ([facility]) Ask Dr. Koval to discuss with patient during or after appointment Thurs 7/10 re: Liberate trial Recheck Vitamin D  level Referral to Gyn for discussion re: large uterine fibroid which may be contributing to UTI/pyelo picture due to ureter compression Consider Urology referral in the future if patient continues to get recurrent UTIs  Results/Tests Pending at Time of Discharge:  Unresulted Labs (From admission, onward)    None      Disposition: Home  Discharge Condition: Stable  Discharge Exam:  Vitals:   03/21/24 0445 03/21/24 0738  BP: 113/75 133/82  Pulse: 88 82  Resp: 18   Temp: 98.3 F (36.8 C) 98.2 F (36.8 C)  SpO2: 98% 99%   Physical Exam: General: Pt sitting up in bed, no acute distress. Cardiovascular: Regular rate and rhythm, no murmurs/rubs/gallops. Respiratory: Normal work of breathing on room air. Clear to auscultation bilaterally; no wheezes, crackles. Abdomen: Bowel sounds present and normoactive bilaterally. Soft, nondistended, nontender. Extremities: Skin warm, dry. No bilateral lower  extremity edema.  Significant Procedures: None  Significant Labs and Imaging:   Recent Labs  Lab 03/20/24 0611 03/21/24 0532  WBC 8.0 9.3  HGB 7.6* 8.0*  HCT 23.9* 25.3*  PLT 486* 569*   Recent Labs  Lab 03/20/24 0611 03/21/24 0532  NA 133* 135  K 4.4 4.5  CL 99 98  CO2 27 27  GLUCOSE 240* 255*  BUN 13 15  CREATININE 0.98 0.82  CALCIUM 8.0* 8.2*   CT Abdomen Pelvis 7/1: Geographic hypoattenuation in the cortex of the mid and upper pole of the right kidney suspicious for pyelonephritis. Cortical renal scarring in the left kidney. Urothelial  thickening and hyperenhancement of the right ureter. No hydronephrosis. Diffuse bladder wall thickening and mucosal hyperenhancement with perivesical stranding. Fibroid in the uterus.  Discharge Medications:  Allergies as of 03/21/2024   No Known Allergies      Medication List     STOP taking these medications    NovoLOG  Mix 70/30 FlexPen (70-30) 100 UNIT/ML FlexPen Generic drug: insulin  aspart protamine - aspart       TAKE these medications    acetaminophen  325 MG tablet Commonly known as: TYLENOL  Take 650 mg by mouth every 6 (six) hours as needed for mild pain (pain score 1-3) or moderate pain (pain score 4-6).   Basaglar  KwikPen 100 UNIT/ML Inject 5 Units into the skin daily.   cephALEXin  500 MG capsule Commonly known as: KEFLEX  Take 1 capsule (500 mg total) by mouth every 12 (twelve) hours for 7 doses.   citalopram  20 MG tablet Commonly known as: CELEXA  Tome 1 tableta (20 mg en total) por va oral diariamente. (Take 1 tablet (20 mg total) by mouth daily.)   famotidine  20 MG tablet Commonly known as: PEPCID  Tome 1 tableta (20 mg en total) por va oral diariamente. (Take 1 tablet (20 mg total) by mouth daily.)   Insupen Pen Needles 31G X 8 MM Misc Generic drug: Insulin  Pen Needle Use one pen needle twice daily.   ondansetron  8 MG disintegrating tablet Commonly known as: ZOFRAN -ODT Take 1 tablet (8 mg total) by mouth every 8 (eight) hours as needed for nausea or vomiting.    phenazopyridine  100 MG tablet Commonly known as: PYRIDIUM  Take 1 tablet (100 mg total) by mouth 3 (three) times daily as needed (For dysuria, bladder spasms).        Discharge Instructions: Please refer to Patient Instructions section of EMR for full details.  Patient was counseled important signs and symptoms that should prompt return to medical care, changes in medications, dietary instructions, activity restrictions, and follow up appointments.   Follow-Up Appointments: - PCP appointment on Thurs 7/10 at 8:30am   Toma Matas, MD 03/21/2024, 4:24 PM PGY-1, [facility]

## 2024-03-21 NOTE — Plan of Care (Signed)

## 2024-03-21 NOTE — Progress Notes (Signed)
 Patient and daughter butler both verbalized understanding of dc intructions. All belongings given to patient.toc meds also given to patient.

## 2024-03-24 ENCOUNTER — Other Ambulatory Visit (HOSPITAL_COMMUNITY): Payer: Self-pay

## 2024-03-24 ENCOUNTER — Inpatient Hospital Stay: Payer: Self-pay

## 2024-03-24 LAB — CULTURE, BLOOD (ROUTINE X 2)
Culture: NO GROWTH
Culture: NO GROWTH
Special Requests: ADEQUATE
Special Requests: ADEQUATE

## 2024-03-27 ENCOUNTER — Ambulatory Visit (INDEPENDENT_AMBULATORY_CARE_PROVIDER_SITE_OTHER): Payer: Self-pay | Admitting: Family Medicine

## 2024-03-27 VITALS — BP 91/62 | HR 89 | Wt 100.0 lb

## 2024-03-27 DIAGNOSIS — E1165 Type 2 diabetes mellitus with hyperglycemia: Secondary | ICD-10-CM

## 2024-03-27 DIAGNOSIS — Z794 Long term (current) use of insulin: Secondary | ICD-10-CM

## 2024-03-27 DIAGNOSIS — D219 Benign neoplasm of connective and other soft tissue, unspecified: Secondary | ICD-10-CM

## 2024-03-27 MED ORDER — METFORMIN HCL ER 500 MG PO TB24: 500.0000 mg | ORAL_TABLET | Freq: Every day | ORAL | 3 refills | Status: DC

## 2024-03-27 NOTE — Patient Instructions (Addendum)
 Good to see you today - Thank you for coming in  Things we discussed today:  1) Increase you semglee  insulin  to 8 units once a day.  - Check your morning blood suagr before eating, if the sugar is above 150, then increase your insulin  by 2 units - For example, if you gave yourself 8 units the day before and your morning sugar today is 170, then give yourself 10 units today. If it is still above 150 tomorrow, then increase to 12 units.    2) I am sending you to see a gynecologist to look at the fibroid in your uterus.

## 2024-03-27 NOTE — Progress Notes (Signed)
    SUBJECTIVE:   CHIEF COMPLAINT / HPI:   EC is a 43 year old F that presents for hospital follow-up.  She is accompanied by her daughter, who she prefers to be her interpreter. - Was admitted 7/1 for UTI, dehydration. Dc'd on keflex  x7days - Was also found to have low glucose at that time, thought to be due to vomiting and poor p.o.  UTI - Reports feeling better since discharge. Finished antibx course yesterday. No measured fevers.  - Reports improvement of dysuria  T2DM - Reports AM fasting glucosse ~200-300s. Since leaving hospital, lowest BG was 173.  - Her daughter helps to give the insulin  dose, so pt isn't sure how much she gets.    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 Koval to discuss with patient during or after appointment Thurs 7/10 re: Liberate trial Recheck Vitamin D  level Referral to Gyn for discussion re: large uterine fibroid which may be contributing to UTI/pyelo picture due to ureter compression Consider Urology referral in the future if patient continues to get recurrent UTIs   PERTINENT  PMH / PSH: T2DM, MDD, fibroid  OBJECTIVE:   BP 91/62   Pulse 89   Wt 100 lb (45.4 kg)   SpO2 100%   BMI 18.29 kg/m   General: Alert, pleasant woman. NAD. HEENT: NCAT. MMM. CV: RRR, no murmurs.   Resp: CTAB, no wheezing or crackles. Normal WOB on RA.  Abm: Soft, nontender, nondistended. BS present. Ext: Moves all ext spontaneously Skin: Warm, well perfused   ASSESSMENT/PLAN:   Assessment & Plan Type 2 diabetes mellitus with hyperglycemia, with long-term current use of insulin  (HCC) Chronic and uncontrolled. Suspect prior low sugars were due to acute UTI and vomiting. Pt BG consistently high, so can re-introduce her metformin  and titrate insulin . - Restart metformin  XR 500mg  daily - Increase Semglee  5 to  8u daily. Instructed to increase by 2u daily if AM fasting BG >150. Do not increase above 16units before coming back - f/u in 1 wk w/ Dr. Koval  Fibroid During admission, urology discussed that her fibroid may be contributing to her chronic urothelial thickening (versus chronic uncontrolled diabetes).  - Referral to gynecology for further evaluation   Recheck VitD level at follow-up  Twyla Nearing, MD [facility]

## 2024-03-27 NOTE — Assessment & Plan Note (Addendum)
 Chronic and uncontrolled. Suspect prior low sugars were due to acute UTI and vomiting. Pt BG consistently high, so can re-introduce her metformin  and titrate insulin . - Restart metformin  XR 500mg  daily - Increase Semglee  5 to 8u daily. Instructed to increase by 2u daily if AM fasting BG >150. Do not increase above 16units before coming back - f/u in 1 wk w/ Dr. Koval

## 2024-04-02 ENCOUNTER — Ambulatory Visit (INDEPENDENT_AMBULATORY_CARE_PROVIDER_SITE_OTHER): Payer: Self-pay | Admitting: Pharmacist

## 2024-04-02 ENCOUNTER — Encounter: Payer: Self-pay | Admitting: Pharmacist

## 2024-04-02 VITALS — BP 66/45 | HR 92 | Wt 101.0 lb

## 2024-04-02 DIAGNOSIS — Z794 Long term (current) use of insulin: Secondary | ICD-10-CM

## 2024-04-02 DIAGNOSIS — E1165 Type 2 diabetes mellitus with hyperglycemia: Secondary | ICD-10-CM

## 2024-04-02 NOTE — Patient Instructions (Addendum)
  Qu bueno verte hoy! Intenta beber ms lquidos (agua u otras bebidas sin caloras). Gatorade ZERO  Tu nivel ideal de azcar en sangre es de 80 a 130 antes de comer y menos de 180 despus.  Cambios de medicacin: SUELTA la metformina.  Aumenta Basaglar  (insulina glargina) de 14 a 18. Luego, aumenta 1 unidad Research officer, trade union a 20 unidades o hablamos de nuevo (probablemente el lunes 21/7).  Contina con todos los [employer].  Sigue as con la dieta y el ejercicio. Intenta llevar una dieta rica en verduras, frutas y carnes magras (pollo, pavo, pescado). Intenta limitar el consumo de sal comiendo verduras frescas o congeladas (en lugar de enlatadas). Enjuaga las verduras enlatadas antes de cocinarlas y no aadas sal a las comidas.    It was nice to see you today!  Please try to drink more liquid (water or other zero calorie drinks) Gatorade ZERO   Your goal blood sugar is 80-130 before eating and less than 180 after eating.  Medication Changes: STOP Metformin   Increase Basaglar  (insulin  glargine) from 14 to 18 Then increase by 1 unit each day until you reach 20 units or we talk again (likely on Monday 7/21)  Continue all other medication the same.   Keep up the good work with diet and exercise. Aim for a diet full of vegetables, fruit and lean meats (chicken, [place], fish). Try to limit salt intake by eating fresh or frozen vegetables (instead of canned), rinse canned vegetables prior to cooking and do not add any additional salt to meals.

## 2024-04-02 NOTE — Progress Notes (Signed)
 S:     Chief Complaint  Patient presents with   Medication Management    Diabetes - Insulin  Tx - CGM restart   43 y.o. female who presents for diabetes evaluation, education, and management. Patient arrives in good spirits and presents with moderate level of assistance by daughter due to unsteady gait. Patient is accompanied by daughter [name]. Appointment completed via daughter [name] acting as interpreter.   Patient was referred and last seen by Primary Care Provider, Dr. Elicia, on 03/27/2024.    PMH is significant for recent recurrent hospitalization for kidney infection and hyperglycemia. Dyslipidemia, and vision loss.   Patient reports Diabetes was diagnosed in 2007.    Current diabetes medications include: metformin  500 mg daily, insulin  glargine 14 units once daily    Patient reports adherence to taking all medications as prescribed.    Patient denies hypoglycemic events.     Reported home fasting blood sugars: > 250 many readings in high 200s.   Drinking regular gatorade for repletion - water has a bad taste to patient.   Patient-reported exercise habits: limited by neuropathy  O:   Review of Systems  Constitutional:  Positive for malaise/fatigue.  Neurological:  Positive for dizziness, tingling, sensory change and weakness.  All other systems reviewed and are negative.   Physical Exam Vitals reviewed.  Pulmonary:     Effort: Pulmonary effort is normal.  Musculoskeletal:     Right lower leg: No edema.     Left lower leg: No edema.  Neurological:     Mental Status: She is alert.  Psychiatric:        Mood and Affect: Mood normal.        Thought Content: Thought content normal.    Lab Results  Component Value Date   HGBA1C 14.6 (H) 03/14/2024   Vitals:   04/02/24 0926  BP: (!) 66/45  Pulse: 92  SpO2: 100%   Lipid Panel     Component Value Date/Time   CHOL 217 (H) 12/01/2008 2119   TRIG 194 (H) 09/08/2020 1704   HDL 66 12/01/2008 2119    CHOLHDL 3.3 Ratio 12/01/2008 2119   VLDL 32 12/01/2008 2119   LDLCALC 119 (H) 12/01/2008 2119     A/P: Diabetes longstanding currently with poor control despite use of insulin  therapy. Patient is able to verbalize appropriate hypoglycemia management plan. Medication adherence appears good. Control is suboptimal due to drinking liquid calories with electrolyte repletion drinks. - Increase dose of basal insulin  Basaglar  (insulin  glargine) from 14 units to 18 units daily (currently taking in the PM). Patient will continue to titrate 1 unit every 1 days if fasting blood sugar > 100mg /dl until sugars reach goal or dose is 20 units - RESTARTED use of Dexcom G7 CGM with use of samples and new receiver. -Discontinued metformin  due to GI side effects and taste disturbance - t20 lb. weight loss is concerning.  -Patient educated on purpose, proper use, and potential adverse effects.  -Extensively discussed pathophysiology of diabetes, recommended lifestyle interventions, dietary effects on blood sugar control.  -Counseled on s/sx of and management of hypoglycemia.  -Next A1c anticipated 2-3 months  Persistently low blood pressure most likely related to intravascular volume depletion which should correct when patient is drinking ZERO calorie beverages and taking higher dose of insulin .  - Reassess at next visit    Written patient instructions provided. Patient verbalized understanding of treatment plan.  Total time in face to face counseling 51 minutes.  Follow-up:  Pharmacist 04/10/2024 PCP clinic visit in TBD Patient seen with Dr. Howell, DO

## 2024-04-02 NOTE — Assessment & Plan Note (Signed)
 Diabetes longstanding currently with poor control despite use of insulin  therapy. Patient is able to verbalize appropriate hypoglycemia management plan. Medication adherence appears good. Control is suboptimal due to drinking liquid calories with electrolyte repletion drinks. - Increase dose of basal insulin  Basaglar  (insulin  glargine) from 14 units to 18 units daily (currently taking in the PM). Patient will continue to titrate 1 unit every 1 days if fasting blood sugar > 100mg /dl until sugars reach goal or dose is 20 units - RESTARTED use of Dexcom G7 CGM with use of samples and new receiver. -Discontinued metformin  due to GI side effects and taste disturbance - t20 lb. weight loss is concerning.  -Patient educated on purpose, proper use, and potential adverse effects.  -Extensively discussed pathophysiology of diabetes, recommended lifestyle interventions, dietary effects on blood sugar control.  -Counseled on s/sx of and management of hypoglycemia.  -Next A1c anticipated 2-3 months

## 2024-04-03 NOTE — Progress Notes (Signed)
 Reviewed and agree with Dr Macky Lower plan.

## 2024-04-10 ENCOUNTER — Encounter: Payer: Self-pay | Admitting: Pharmacist

## 2024-04-10 ENCOUNTER — Ambulatory Visit: Payer: Self-pay | Admitting: Pharmacist

## 2024-04-10 VITALS — BP 109/73 | HR 85 | Wt 113.6 lb

## 2024-04-10 DIAGNOSIS — Z794 Long term (current) use of insulin: Secondary | ICD-10-CM

## 2024-04-10 DIAGNOSIS — E1165 Type 2 diabetes mellitus with hyperglycemia: Secondary | ICD-10-CM

## 2024-04-10 MED ORDER — BASAGLAR KWIKPEN 100 UNIT/ML ~~LOC~~ SOPN: 20.0000 [IU] | PEN_INJECTOR | Freq: Every day | SUBCUTANEOUS | Status: DC

## 2024-04-10 NOTE — Patient Instructions (Addendum)
  Qu bueno verte hoy! Tu glucosa est mejor que en los ltimos aos! Durante los prximos [address] intenta comer de forma que tus niveles de glucosa mejoren para que se mantengan entre 100 y 199.  Con el tiempo, tu nivel de Production assistant, radio ideal ser de 80 a 130 antes de comer y menos de 180 despus.  Objetivos para tu dieta: Intenta dejar de beber jugos (puedes beber 2,5 cm de jugo en un vaso si tu nivel es <100).  Intenta comer ms verduras de hoja verde.  Intenta comer menos pan, arroz, pasta y papas, tanto en frecuencia como en cantidad. Contina con tu dieta y ejercicio. Intenta llevar una dieta rica en verduras, frutas y carnes magras (pollo, pavo, pescado).  Cambios de medicacin: Contina con la misma dosis de insulina hoy: toma 20 unidades una vez al da por la [place].  SUPENDE la metformina: creo que no toleras Location manager.  Contine tomando todos los dems medicamentos de la misma manera.   It was nice to see you today!  Your glucose is the best it has been is several years! Over the next 10 days, please try to eat in ways to improve your glucose readings to have more readings in the 100-199 range.    In time, your goal blood sugar is 80-130 before eating and less than 180 after eating.  Goals for your diet Try to stop drinking juices (you can drink 1 inch of juice in a glass if your reading is low < 100)  Try to eat more green vegetables.    Try to eat less in both frequency and quantity of bread, rice, pasta and potatoes. Keep up the good work with diet and exercise. Aim for a diet full of vegetables, fruit and lean meats (chicken, [place], fish).   Medication Changes: Continue same dose of insulin  today - Take 20 units once daily in the morning.   STOP Metformin  - I believe you do not tolerate this medication very well.   Continue all other medication the same.

## 2024-04-10 NOTE — Assessment & Plan Note (Signed)
 Diabetes longstanding since 2007 and currently much improved to values she has not experienced routinely in many years.  Patient subjectively is greatly improved.  She is cheerful and appears stronger with improved ambulation.  Patient is able to verbalize appropriate hypoglycemia management plan. Medication adherence appears good with basaglar  (insulin  glargine)  20 units once daily. Control is suboptimal due to dietary indiscretion including ice cream and other carbohydrates in large portion sizes.  - Continued Basaglar  (insulin  glargine) at 20 units once daily.  Patient has made significant improvements but additional focused work on diet is needed.     - Removed metformin  from medication list and added to intolerance list .  - Patient educated on purpose, proper use, and potential adverse effects of low glucose.  - Extensively discussed pathophysiology of diabetes, recommended lifestyle interventions, dietary effects on blood sugar control.  Spent > 50 % of visit with education.  Provided additional Dexcom G7 sensor.

## 2024-04-10 NOTE — Progress Notes (Signed)
 S:     Chief Complaint  Patient presents with   Medication Management    Diabetes   43 y.o. female who presents for diabetes evaluation, education, and management. Patient arrives in good spirits and presents without any assistance - she is much more stable on her feet than previous visits, walks with minimal assistance from her daughter. Patient is accompanied by her daughter, [name].  Visit conducted through interpreter, Miskey.    Patient was referred and last seen by Primary Care Provider, Dr. Elicia, on 03/27/2024.  At last visit with me 7 days prior, CGM was restarted and insulin  dosing was titrated.   PMH is significant for poor glucose control with A1C > 8 for more than 15years.   Chart review shows Diabetes was diagnosed in 2007.   Family/Social History: three daughters who support the patient  Current diabetes medications include: basaglar  (insulin  glargine) 20 units once daily,   As directed she has stopped metformin .   Patient reports adherence to taking all medications as prescribed.  Of note, she has not needed any PRN of ondansetron  since stopping metformin .  She continues to use pyridium  PRN (not daily).    Do you feel that your medications are working for you? Yes - feels much better.  Have you been experiencing any side effects to the medications prescribed? no Do you have any problems obtaining medications due to transportation or finances? yes Insurance coverage: none  Patient reports hypoglycemic event when sugar on CGM showed reading of 80.     Patient reports nocturia (nighttime urination) has decreased.  Patient reports neuropathy (nerve pain) remains but is less.  Patient reports self foot exams.   Patient reported dietary habits: Eats 3 meals/day Breakfast: oatmeal, bread Lunch: spaghetti, burger, a few fries, juice, water Dinner: tortillas similar to lunch  Snacks: ice cream, drinking cinnamon tea with no sugar added   Drinks: water, juice  O:    Review of Systems  Genitourinary:  Negative for frequency and urgency.  Neurological:  Positive for tingling (less than previous).  All other systems reviewed and are negative.   Physical Exam Vitals reviewed.  Constitutional:      Appearance: Normal appearance.  Pulmonary:     Effort: Pulmonary effort is normal.  Neurological:     Mental Status: She is alert.     Deep Tendon Reflexes: Abnormal reflex: much more positive and cheerful than previous visits.  Psychiatric:        Mood and Affect: Mood normal.        Behavior: Behavior normal.        Thought Content: Thought content normal.        Judgment: Judgment normal.     DexcomCGM Download today 04/11/2024 - 1 week of data % Time CGM is active: 100% Average Glucose: 279 mg/dL Glucose Management Indicator: 10.0  Glucose Variability: 34.2%% (goal <36%) Time in Goal:  - Time in range 70-180: 18% - Time above range: 82% - Time below range: 0% Observed patterns: 1-3 AM intake of carbohydrates - patient admits to being hungry and eating left overs from earlier in the day at that time.   Lab Results  Component Value Date   HGBA1C 14.6 (H) 03/14/2024   Vitals:   04/10/24 1017  BP: 109/73  Pulse: 85  SpO2: 100%    Lipid Panel     Component Value Date/Time   CHOL 217 (H) 12/01/2008 2119   TRIG 194 (H) 09/08/2020 1704   HDL  66 12/01/2008 2119   CHOLHDL 3.3 Ratio 12/01/2008 2119   VLDL 32 12/01/2008 2119   LDLCALC 119 (H) 12/01/2008 2119    A/P: Diabetes longstanding since 2007 and currently much improved to values she has not experienced routinely in many years.  Patient subjectively is greatly improved.  She is cheerful and appears stronger with improved ambulation.  Patient is able to verbalize appropriate hypoglycemia management plan. Medication adherence appears good with basaglar  (insulin  glargine)  20 units once daily. Control is suboptimal due to dietary indiscretion including ice cream and other  carbohydrates in large portion sizes.  - Continued Basaglar  (insulin  glargine) at 20 units once daily.  Patient has made significant improvements but additional focused work on diet is needed.     - Removed metformin  from medication list and added to intolerance list .  - Patient educated on purpose, proper use, and potential adverse effects of low glucose.  - Extensively discussed pathophysiology of diabetes, recommended lifestyle interventions, dietary effects on blood sugar control.  Spent > 50 % of visit with education.  Provided additional Dexcom G7 sensor.   Written patient instructions provided. Patient verbalized understanding of treatment plan.  Total time in face to face counseling 47 minutes.    Follow-up:  Pharmacist 2 weeks with goal of seeing more readings in TIR and improved GMI/ average glucose. PCP clinic visit in TBD

## 2024-04-10 NOTE — Progress Notes (Signed)
 Reviewed and agree with Dr Macky Lower plan.

## 2024-04-24 ENCOUNTER — Telehealth: Payer: Self-pay | Admitting: Pharmacist

## 2024-04-24 ENCOUNTER — Ambulatory Visit: Payer: Self-pay | Admitting: Pharmacist

## 2024-04-24 NOTE — Telephone Encounter (Signed)
 Attempted to contact patient for follow-up of missed appointment this AM for diabetes control.    Left HIPAA compliant voice mail requesting call back to direct phone: [phone]  Total time with patient call and documentation of interaction: 4 minutes.

## 2024-04-29 ENCOUNTER — Other Ambulatory Visit: Payer: Self-pay

## 2024-04-29 ENCOUNTER — Telehealth: Payer: Self-pay | Admitting: Pharmacist

## 2024-04-29 ENCOUNTER — Other Ambulatory Visit: Payer: Self-pay | Admitting: Family Medicine

## 2024-04-29 MED ORDER — BASAGLAR KWIKPEN 100 UNIT/ML ~~LOC~~ SOPN
20.0000 [IU] | PEN_INJECTOR | Freq: Every day | SUBCUTANEOUS | 1 refills | Status: DC
  Filled 2024-04-29: qty 6, 30d supply, fill #0

## 2024-04-29 NOTE — Telephone Encounter (Signed)
 Patient contacted for follow-up of glucose control and medication supply access.   Attempted to contact patient's daughter to discuss glucose control.   [name] is the daughter involved in daily injection therapy for patient.   Requested call back at [phone]  Total time with patient call and documentation of interaction: 8 minutes.  F/U Phone call planned: None

## 2024-04-29 NOTE — Telephone Encounter (Signed)
 Reviewed and agree with Dr Rennis plan.

## 2024-04-30 ENCOUNTER — Other Ambulatory Visit: Payer: Self-pay

## 2024-05-01 ENCOUNTER — Other Ambulatory Visit: Payer: Self-pay

## 2024-05-29 ENCOUNTER — Encounter (HOSPITAL_COMMUNITY): Payer: Self-pay

## 2024-05-29 ENCOUNTER — Ambulatory Visit: Payer: Self-pay | Admitting: Student

## 2024-05-29 ENCOUNTER — Other Ambulatory Visit: Payer: Self-pay

## 2024-05-29 ENCOUNTER — Ambulatory Visit (INDEPENDENT_AMBULATORY_CARE_PROVIDER_SITE_OTHER): Payer: Self-pay | Admitting: Pharmacist

## 2024-05-29 ENCOUNTER — Inpatient Hospital Stay (HOSPITAL_COMMUNITY)
Admission: EM | Admit: 2024-05-29 | Discharge: 2024-05-31 | DRG: 690 | Disposition: A | Discharge status: Home / Self Care | Payer: Self-pay | Attending: Family Medicine | Admitting: Family Medicine

## 2024-05-29 ENCOUNTER — Encounter: Payer: Self-pay | Admitting: Pharmacist

## 2024-05-29 ENCOUNTER — Emergency Department (HOSPITAL_COMMUNITY): Payer: Self-pay

## 2024-05-29 VITALS — BP 71/50 | HR 91

## 2024-05-29 VITALS — BP 78/50 | HR 99 | Wt 106.2 lb

## 2024-05-29 DIAGNOSIS — Z794 Long term (current) use of insulin: Secondary | ICD-10-CM

## 2024-05-29 DIAGNOSIS — Z888 Allergy status to other drugs, medicaments and biological substances status: Secondary | ICD-10-CM

## 2024-05-29 DIAGNOSIS — E785 Hyperlipidemia, unspecified: Secondary | ICD-10-CM | POA: Diagnosis present

## 2024-05-29 DIAGNOSIS — D619 Aplastic anemia, unspecified: Secondary | ICD-10-CM | POA: Diagnosis present

## 2024-05-29 DIAGNOSIS — E8809 Other disorders of plasma-protein metabolism, not elsewhere classified: Secondary | ICD-10-CM | POA: Insufficient documentation

## 2024-05-29 DIAGNOSIS — Z681 Body mass index (BMI) 19 or less, adult: Secondary | ICD-10-CM

## 2024-05-29 DIAGNOSIS — Z59819 Housing instability, housed unspecified: Secondary | ICD-10-CM

## 2024-05-29 DIAGNOSIS — E1142 Type 2 diabetes mellitus with diabetic polyneuropathy: Secondary | ICD-10-CM

## 2024-05-29 DIAGNOSIS — Z789 Other specified health status: Secondary | ICD-10-CM

## 2024-05-29 DIAGNOSIS — E440 Moderate protein-calorie malnutrition: Secondary | ICD-10-CM | POA: Diagnosis present

## 2024-05-29 DIAGNOSIS — N390 Urinary tract infection, site not specified: Secondary | ICD-10-CM | POA: Diagnosis present

## 2024-05-29 DIAGNOSIS — N12 Tubulo-interstitial nephritis, not specified as acute or chronic: Principal | ICD-10-CM | POA: Diagnosis present

## 2024-05-29 DIAGNOSIS — Z79899 Other long term (current) drug therapy: Secondary | ICD-10-CM

## 2024-05-29 DIAGNOSIS — E1110 Type 2 diabetes mellitus with ketoacidosis without coma: Principal | ICD-10-CM

## 2024-05-29 DIAGNOSIS — E1165 Type 2 diabetes mellitus with hyperglycemia: Secondary | ICD-10-CM

## 2024-05-29 DIAGNOSIS — R079 Chest pain, unspecified: Secondary | ICD-10-CM

## 2024-05-29 DIAGNOSIS — K219 Gastro-esophageal reflux disease without esophagitis: Secondary | ICD-10-CM | POA: Diagnosis present

## 2024-05-29 DIAGNOSIS — R1084 Generalized abdominal pain: Secondary | ICD-10-CM

## 2024-05-29 DIAGNOSIS — R1011 Right upper quadrant pain: Secondary | ICD-10-CM | POA: Diagnosis present

## 2024-05-29 DIAGNOSIS — Z603 Acculturation difficulty: Secondary | ICD-10-CM | POA: Diagnosis present

## 2024-05-29 DIAGNOSIS — I959 Hypotension, unspecified: Secondary | ICD-10-CM | POA: Diagnosis present

## 2024-05-29 DIAGNOSIS — E118 Type 2 diabetes mellitus with unspecified complications: Secondary | ICD-10-CM

## 2024-05-29 DIAGNOSIS — D649 Anemia, unspecified: Secondary | ICD-10-CM | POA: Insufficient documentation

## 2024-05-29 DIAGNOSIS — N179 Acute kidney failure, unspecified: Secondary | ICD-10-CM | POA: Diagnosis present

## 2024-05-29 DIAGNOSIS — Z5941 Food insecurity: Secondary | ICD-10-CM

## 2024-05-29 DIAGNOSIS — E860 Dehydration: Secondary | ICD-10-CM | POA: Diagnosis present

## 2024-05-29 DIAGNOSIS — E861 Hypovolemia: Secondary | ICD-10-CM

## 2024-05-29 DIAGNOSIS — E119 Type 2 diabetes mellitus without complications: Secondary | ICD-10-CM

## 2024-05-29 DIAGNOSIS — D509 Iron deficiency anemia, unspecified: Secondary | ICD-10-CM | POA: Diagnosis present

## 2024-05-29 DIAGNOSIS — F32A Depression, unspecified: Secondary | ICD-10-CM | POA: Diagnosis present

## 2024-05-29 LAB — CBC
HCT: 29.2 % — ABNORMAL LOW (ref 36.0–46.0)
Hemoglobin: 9 g/dL — ABNORMAL LOW (ref 12.0–15.0)
MCH: 26.4 pg (ref 26.0–34.0)
MCHC: 30.8 g/dL (ref 30.0–36.0)
MCV: 85.6 fL (ref 80.0–100.0)
Platelets: 501 K/uL — ABNORMAL HIGH (ref 150–400)
RBC: 3.41 MIL/uL — ABNORMAL LOW (ref 3.87–5.11)
RDW: 13.8 % (ref 11.5–15.5)
WBC: 10.6 K/uL — ABNORMAL HIGH (ref 4.0–10.5)
nRBC: 0 % (ref 0.0–0.2)

## 2024-05-29 LAB — CBG MONITORING, ED
Glucose-Capillary: 292 mg/dL — ABNORMAL HIGH (ref 70–99)
Glucose-Capillary: 355 mg/dL — ABNORMAL HIGH (ref 70–99)
Glucose-Capillary: 248 mg/dL — ABNORMAL HIGH (ref 70–99)
Glucose-Capillary: 276 mg/dL — ABNORMAL HIGH (ref 70–99)

## 2024-05-29 LAB — URINALYSIS, ROUTINE W REFLEX MICROSCOPIC
Bilirubin Urine: NEGATIVE
Glucose, UA: 150 mg/dL — AB
Ketones, ur: 5 mg/dL — AB
Nitrite: NEGATIVE
Protein, ur: >=300 mg/dL — AB
Specific Gravity, Urine: 1.014 (ref 1.005–1.030)
WBC, UA: >50 WBC/hpf (ref 0–5)
pH: 5 (ref 5.0–8.0)

## 2024-05-29 LAB — COMPREHENSIVE METABOLIC PANEL WITH GFR
ALT: 8 U/L (ref 0–44)
AST: <10 U/L — ABNORMAL LOW (ref 15–41)
Albumin: 2.8 g/dL — ABNORMAL LOW (ref 3.5–5.0)
Alkaline Phosphatase: 68 U/L (ref 38–126)
Anion gap: 12 (ref 5–15)
BUN: 26 mg/dL — ABNORMAL HIGH (ref 6–20)
CO2: 20 mmol/L — ABNORMAL LOW (ref 22–32)
Calcium: 8.8 mg/dL — ABNORMAL LOW (ref 8.9–10.3)
Chloride: 98 mmol/L (ref 98–111)
Creatinine, Ser: 1.2 mg/dL — ABNORMAL HIGH (ref 0.44–1.00)
GFR, Estimated: 57 mL/min — ABNORMAL LOW (ref >60)
Glucose, Bld: 304 mg/dL — ABNORMAL HIGH (ref 70–99)
Potassium: 4.7 mmol/L (ref 3.5–5.1)
Sodium: 130 mmol/L — ABNORMAL LOW (ref 135–145)
Total Bilirubin: 0.6 mg/dL (ref 0.0–1.2)
Total Protein: 7.3 g/dL (ref 6.5–8.1)

## 2024-05-29 LAB — I-STAT VENOUS BLOOD GAS, ED
Acid-base deficit: 3 mmol/L — ABNORMAL HIGH (ref 0.0–2.0)
Bicarbonate: 21 mmol/L (ref 20.0–28.0)
Calcium, Ion: 1.12 mmol/L — ABNORMAL LOW (ref 1.15–1.40)
HCT: 30 % — ABNORMAL LOW (ref 36.0–46.0)
Hemoglobin: 10.2 g/dL — ABNORMAL LOW (ref 12.0–15.0)
O2 Saturation: 68 %
Potassium: 4.5 mmol/L (ref 3.5–5.1)
Sodium: 129 mmol/L — ABNORMAL LOW (ref 135–145)
TCO2: 22 mmol/L (ref 22–32)
pCO2, Ven: 31.9 mmHg — ABNORMAL LOW (ref 44–60)
pH, Ven: 7.426 (ref 7.25–7.43)
pO2, Ven: 34 mmHg (ref 32–45)

## 2024-05-29 LAB — I-STAT CHEM 8, ED
BUN: 27 mg/dL — ABNORMAL HIGH (ref 6–20)
Calcium, Ion: 1.1 mmol/L — ABNORMAL LOW (ref 1.15–1.40)
Chloride: 98 mmol/L (ref 98–111)
Creatinine, Ser: 1.2 mg/dL — ABNORMAL HIGH (ref 0.44–1.00)
Glucose, Bld: 310 mg/dL — ABNORMAL HIGH (ref 70–99)
HCT: 29 % — ABNORMAL LOW (ref 36.0–46.0)
Hemoglobin: 9.9 g/dL — ABNORMAL LOW (ref 12.0–15.0)
Potassium: 4.7 mmol/L (ref 3.5–5.1)
Sodium: 130 mmol/L — ABNORMAL LOW (ref 135–145)
TCO2: 21 mmol/L — ABNORMAL LOW (ref 22–32)

## 2024-05-29 LAB — POCT URINALYSIS DIP (MANUAL ENTRY)
Glucose, UA: 250 mg/dL — AB
Nitrite, UA: NEGATIVE
Protein Ur, POC: >=300 mg/dL — AB
Spec Grav, UA: 1.025 (ref 1.010–1.025)
Urobilinogen, UA: 1 U/dL
pH, UA: 5.5 (ref 5.0–8.0)

## 2024-05-29 LAB — TROPONIN I (HIGH SENSITIVITY): Troponin I (High Sensitivity): 6 ng/L (ref <18)

## 2024-05-29 LAB — LIPASE, BLOOD: Lipase: 44 U/L (ref 11–51)

## 2024-05-29 LAB — HCG, SERUM, QUALITATIVE: Preg, Serum: NEGATIVE

## 2024-05-29 LAB — BETA-HYDROXYBUTYRIC ACID: Beta-Hydroxybutyric Acid: 1.66 mmol/L — ABNORMAL HIGH (ref 0.05–0.27)

## 2024-05-29 LAB — GLUCOSE, POCT (MANUAL RESULT ENTRY): POC Glucose: 262 mg/dL — AB (ref 70–99)

## 2024-05-29 MED ORDER — INSULIN ASPART 100 UNIT/ML IJ SOLN
11.0000 [IU] | Freq: Once | INTRAMUSCULAR | Status: AC
  Administered 2024-05-29: 11 [IU] via SUBCUTANEOUS

## 2024-05-29 MED ORDER — INSULIN ASPART 100 UNIT/ML IJ SOLN: 0.0000 [IU] | Freq: Three times a day (TID) | INTRAMUSCULAR | Status: DC

## 2024-05-29 MED ORDER — IOHEXOL 350 MG/ML SOLN
80.0000 mL | Freq: Once | INTRAVENOUS | Status: AC | PRN
  Administered 2024-05-29: 80 mL via INTRAVENOUS

## 2024-05-29 MED ORDER — ONDANSETRON 4 MG PO TBDP
4.0000 mg | ORAL_TABLET | Freq: Three times a day (TID) | ORAL | Status: DC | PRN
  Administered 2024-05-31: 4 mg via ORAL
  Filled 2024-05-29: qty 1

## 2024-05-29 MED ORDER — ACETAMINOPHEN 500 MG PO TABS: 1000.0000 mg | ORAL_TABLET | Freq: Four times a day (QID) | ORAL | Status: DC | PRN

## 2024-05-29 MED ORDER — ONDANSETRON 4 MG PO TBDP: 8.0000 mg | ORAL_TABLET | Freq: Three times a day (TID) | ORAL | Status: DC | PRN

## 2024-05-29 MED ORDER — FAMOTIDINE 20 MG PO TABS
20.0000 mg | ORAL_TABLET | Freq: Every day | ORAL | Status: DC
  Administered 2024-05-29 – 2024-05-31 (×3): 20 mg via ORAL
  Filled 2024-05-29 (×3): qty 1

## 2024-05-29 MED ORDER — BASAGLAR KWIKPEN 100 UNIT/ML ~~LOC~~ SOPN
24.0000 [IU] | PEN_INJECTOR | Freq: Every day | SUBCUTANEOUS | 1 refills | Status: DC
  Filled 2024-05-29: qty 6, 25d supply, fill #0

## 2024-05-29 MED ORDER — INSULIN GLARGINE 100 UNIT/ML ~~LOC~~ SOLN
10.0000 [IU] | Freq: Every day | SUBCUTANEOUS | Status: DC
  Filled 2024-05-29: qty 0.1

## 2024-05-29 MED ORDER — LACTATED RINGERS IV SOLN
INTRAVENOUS | Status: AC
Start: 2024-05-29 — End: 2024-05-30

## 2024-05-29 MED ORDER — GABAPENTIN 100 MG PO CAPS
100.0000 mg | ORAL_CAPSULE | Freq: Three times a day (TID) | ORAL | Status: DC
  Administered 2024-05-30 – 2024-05-31 (×5): 100 mg via ORAL
  Filled 2024-05-29 (×5): qty 1

## 2024-05-29 MED ORDER — CEFTRIAXONE SODIUM 1 G IJ SOLR
1.0000 g | INTRAMUSCULAR | Status: DC
  Administered 2024-05-30: 1 g via INTRAVENOUS
  Filled 2024-05-29: qty 10

## 2024-05-29 MED ORDER — KETOROLAC TROMETHAMINE 15 MG/ML IJ SOLN
15.0000 mg | Freq: Four times a day (QID) | INTRAMUSCULAR | Status: DC | PRN
  Administered 2024-05-30: 15 mg via INTRAVENOUS
  Filled 2024-05-29: qty 1

## 2024-05-29 MED ORDER — SODIUM CHLORIDE 0.9 % IV SOLN
1.0000 g | Freq: Once | INTRAVENOUS | Status: AC
  Administered 2024-05-29: 1 g via INTRAVENOUS
  Filled 2024-05-29: qty 10

## 2024-05-29 MED ORDER — BASAGLAR KWIKPEN 100 UNIT/ML ~~LOC~~ SOPN: 10.0000 [IU] | PEN_INJECTOR | Freq: Every day | SUBCUTANEOUS | Status: DC

## 2024-05-29 MED ORDER — INSULIN PEN NEEDLE 31G X 8 MM MISC
1.0000 | Freq: Two times a day (BID) | 0 refills | Status: AC
  Filled 2024-05-29: qty 100, 30d supply, fill #0

## 2024-05-29 MED ORDER — LACTATED RINGERS IV BOLUS
1000.0000 mL | Freq: Once | INTRAVENOUS | Status: AC
  Administered 2024-05-29: 1000 mL via INTRAVENOUS

## 2024-05-29 NOTE — Progress Notes (Signed)
 S:     Chief Complaint  Patient presents with   Medication Management    DM    43 y.o. female who presents for diabetes evaluation, education, and management. Patient arrives in okay spirits and presents with moderate level of assistance by daughter due to unsteady gait. Patient is accompanied by daughter [name]. Appointment completed via daughter [name] acting as interpreter.   Patient was referred and last seen by Primary Care Provider, Dr. Elicia, on 03/27/24. Last seen by pharmacy team on 07/24. At this appointment dose of Basaglar  (insulin  glargine) was continued at 20 units once daily. Metformin  was removed from medication list and added to intolerances.   PMH is significant for recent recurrent hospitalization for kidney infection and hyperglycemia. Dyslipidemia, and vision loss.   Diagnosed with T2DM in: 2007   Current diabetes medications include: Basaglar  (insulin  glargine) 20 units daily  Current hypertension medications include: None Current hyperlipidemia medications include: None  Patient denies adherence with medications, reports missing her medications this past week due to abdominal pain. Has not been able to use her insulin  in ~ 4 days. When she is able to use her insulin , her daughter, [name], assists in administration.   Denies nocturia.   Have you been experiencing any side effects to the medications prescribed? Yes - burning sensation when using long acting insulin  -- discussed that this may be due to administration technique  Insurance coverage: None  Eating: has not been able to eat much recently - abdominal pain makes eating difficult Drinks: has not been able to drink much recently, but when drinking it is water  O:   Review of Systems  Constitutional:  Positive for malaise/fatigue.  Gastrointestinal:  Positive for abdominal pain.  Musculoskeletal:  Positive for back pain and neck pain.  Neurological:  Positive for weakness.  Psychiatric/Behavioral:   The patient is nervous/anxious.     Physical Exam Vitals reviewed.  Constitutional:      Appearance: She is ill-appearing.  Neurological:     Motor: Weakness present.     Coordination: Coordination abnormal.    BG stick today 09/11: 262 mg/dL  7 day average blood glucose: 219  Libre3 CGM Download today from Aug 17-30  % Time CGM is active: 98.5% Average Glucose: 219 mg/dL Glucose Management Indicator: 8.6  Glucose Variability: 45.8% (goal <36%) Time in Goal:  - Time in range 70-180: 39% - Time above range: 59% - Time below range: 2% Observed patterns:  Lab Results  Component Value Date   HGBA1C 14.6 (H) 03/14/2024   Vitals:   05/29/24 0930 05/29/24 0933  BP: (!) 67/47 (!) 71/50  Pulse: 91   SpO2: 100%     Lipid Panel     Component Value Date/Time   CHOL 217 (H) 12/01/2008 2119   TRIG 194 (H) 09/08/2020 1704   HDL 66 12/01/2008 2119   CHOLHDL 3.3 Ratio 12/01/2008 2119   VLDL 32 12/01/2008 2119   LDLCALC 119 (H) 12/01/2008 2119    A/P: Diabetes longstanding currently uncontrolled. Medication adherence appears poor - has not taken insulin  in 4 days and when insulin  is used it leaves a burning sensation at the site of injection. Control is suboptimal due to poor medication adherence and general unwell feeling. -Increased Basaglar  (insulin  glargine) from 20 units once daily to 24 units once daily -Strongly consider adding mealtime insulin  at upcoming visit if BG not improved -Burning sensation when using insulin  - discussed this may be due to administration technique.  -Daughter  provided 20 units of insulin  in clinic after BG finger stick  -Discussed eating a significant sized snack midday to prevent low BG that is a recurrent issue  -Patient educated on purpose, proper use, and potential adverse effects.  -Extensively discussed pathophysiology of diabetes, recommended lifestyle interventions, dietary effects on blood sugar control.  -Counseled on s/sx of and  management of hypoglycemia.  -Provided 3 new Dexcom G7 sensors  -Consulted Dr. Howell for generalized pain and fatigue accompanied by low pressure. Dr. Rumball assessed patient as well.   Written patient instructions provided. Patient verbalized understanding of treatment plan.  Total time in face to face counseling 49 minutes.    Follow-up:  Pharmacist visit: TBD PCP visit on 06/04/24  Patient seen with Estefana Blase, PharmD Candidate - [employer]4 student.

## 2024-05-29 NOTE — Patient Instructions (Signed)
 It was great to see you! Thank you for allowing me to participate in your care!   I recommend that you always bring your medications to each appointment as this makes it easy to ensure we are on the correct medications and helps us  not miss when refills are needed.  Our plans for today:  - go to emergency room  Take care and seek immediate care sooner if you develop any concerns. Please remember to show up 15 minutes before your scheduled appointment time!  Gladis Church, DO [facility]

## 2024-05-29 NOTE — ED Notes (Signed)
 Reults od vbg to dr,certolo by at

## 2024-05-29 NOTE — Assessment & Plan Note (Addendum)
 Creatinine today 1.20 up from baseline of around 0.8.  Likely prerenal in the setting of difficult p.o. with pain from her UTI or, less likely, pyelonephritis. UA with suggestive findings. Reassuringly afebrile and comfortable on exam. While a little young for perimenopause, patient's report of dry vaginal mucosa could be contributing to recurrent UTIs given otherwise reassuring structural findings on CTAP. -Status post 2 L LR boluses, will add on maintenance fluids for the next 12 hours and restart pending p.o. -Continue Rocephin  given previous urine culture sensitivities, detransition pending current urine culture results -Consider GU exam to further evaluate dry vaginal mucosa when patient amenable -Consider methenamine while attempting to gain better control of patient's T2DM -Tylenol  for pain -I's and O's -A.m. BMP

## 2024-05-29 NOTE — ED Notes (Signed)
 Pt eating dinner. Bladder scan and meds after meal.

## 2024-05-29 NOTE — Assessment & Plan Note (Signed)
 Characteristic symptoms on exam with uncontrolled diabetes. -Start gabapentin  100 mg 3 times daily

## 2024-05-29 NOTE — Assessment & Plan Note (Signed)
 Recent A1c 2 months ago 14.6.  Unfortunately is not been taking her long-acting insulin  in the last 4 days given her current illness.  Reassuringly without acidosis on VBG, though she does have a slightly elevated BHB and borderline anion gap. -Start sliding scale insulin  for now while patient is not taking in good p.o. -Restart home glargine as able

## 2024-05-29 NOTE — Progress Notes (Signed)
    SUBJECTIVE:   CHIEF COMPLAINT / HPI:  Seen in consultation of our pharmacy clinic, notified by Dr. Koval the patient had low blood pressure, excessive sleepiness and an elevated blood sugar of 260 with associated abdominal pain.  Abdominal pain  Hypotension Generalized abdominal pain, ongoing for days.  Diarrhea 2 days ago, no bowel movement since.  Poor appetite, poor p.o. intake.  No urinary tract symptoms, but does have history of UTI in the past.  Also has history of DKA in the past.  She has not been taking her insulin  for 4 days.  Review of CGM shows poorly controlled blood glucose at night, however we do not have last 2 weeks of information.  She has a recurrent history of showing up to clinic with low blood pressure 2/2 significant dehydration from poor p.o. intake.  Both patient and her daughter would like to avoid hospitalization if possible.  Neuropathy symptoms Additional endorsing hand and foot numbness and tingling for many months, not currently on medication for neuropathy.  Given poorly controlled diabetes, strongly suspect neuropathy.  OBJECTIVE:   BP (!) 71/50   Pulse 91   LMP  (LMP Unknown)   SpO2 100%    General: NAD, pleasant Cardio: RRR, no MRG. Cap Refill >3 s, dry mucous membranes Respiratory: CTAB, normal wob on RA GI: Abdomen is soft,not distended. BS present.  Mild TTP throughout. Skin: Warm and dry  ASSESSMENT/PLAN:   Assessment & Plan Generalized abdominal pain Hypotension due to hypovolemia Type 2 diabetes mellitus with hyperglycemia, with long-term current use of insulin  (HCC) Attempted p.o. hydration in clinic, with no improvement of blood pressure.  Gave 20 units of her home Basaglar , monitored for 45 minutes.  Only minimal improvement of symptoms. Given history of DKA, continued hypertension and current CBG of 262, recommend ED evaluation for DKA rule out.  UA showed hemoglobin/leukocytes, but sample was insufficient for microscopy/culture. -  Patient will go to ED by personal vehicle - Reapplied CGM - Will schedule follow-up within the next 1 week Diabetic polyneuropathy associated with type 2 diabetes mellitus (HCC) Not currently on treatment. - Consider trial of gabapentin  once patient follows up next week  Gladis Church, DO [facility]

## 2024-05-29 NOTE — Assessment & Plan Note (Signed)
 Resolved; normal BPs here in the ED even prior to fluid administration.  Feel there may be a machine discrepancy, though she does have clinical evidence of dehydration on exam today. -Continue to monitor vitals -Fluids as above

## 2024-05-29 NOTE — Plan of Care (Signed)
 FMTS Interim Progress Note  S: Saw patient at bedside, was in severe 9 out of 10 suprapubic stabbing pain radiating to bilateral flanks.  Pain is intermittent and not associated with food or urination.  Endorses history of pyelonephritis.  Reports she has had really good urine output.  Endorses her nausea is improved slightly, is tolerating food.  Also endorses chills with pain and fatigue.  Denies fevers, vomiting, diarrhea, or dysuria.  Also reports that she had not been taking her medicine for last 2 to 3 days because she was feeling unwell.  Leg pain is about the same.  Reports chest tightness with some shortness of breath mostly after eating meals.  Denies eating spicy or greasy meals or increased caffeine intake.  Does report that she has had 3 syncopal episodes in the last year, none intermittent dyspnea.  O: BP (!) 142/93   Pulse 94   Temp 97.8 F (36.6 C) (Oral)   Resp 17   LMP  (LMP Unknown)   SpO2 100%    General: Well-nourished, in pain Cardiac: Regular rate and rhythm. Normal S1/S2.  Systolic murmur most prominent in aorta area with carotid radiation, no rubs, or gallops appreciated. Lungs: Clear bilaterally to ascultation. Abdomen: Normoactive bowel sounds.  Suprapubic tenderness to light palpation. No rebound or guarding.   No CVA tenderness, flank pain was not reproducible with palpation  Extremities: No peripheral edema bilaterally Psych: Pleasant and appropriate    A/P: UTI, AKI Agree with current plan, suspect AKI is likely prerenal with poor p.o. and some concern for pyelonephritis given the flank pain but no CVA tenderness or fevers.  Endorsed 9 out of 10 abdominal pain tonight -Pending urine cultures -S/p 2 L LR bolus, on 80 mL/hr LRmaintenance fluids -Continue Rocephin , to modify pending sensitivities -Continue Tylenol  for mild to moderate pain -Will add on Toradol  15 mg every 6 hours as needed for breakthrough pain  Chest pain Most likely secondary to the GERD  given the context of food association and medication noncompliance. -EKG reassuring -Troponin 6 -Continue Pepcid  20 mg daily  T2DM complicated with polyneuropathy Poorly controlled, last A1c 14.6 and not taking doses of insulin  when feeling unwell.  Extensive education was given regarding importance of medication compliance -BGs persistently greater than 250 today, gave 11 units aspart since sliding scale scheduled to start tomorrow a.m. -Added 10 units glargine tomorrow a.m. given still poor p.o. intake but persistently elevated glucose, can consider increasing based off overnight CBGs -Continue gabapentin  100 mg 3 times daily   Systolic murmur Denies previous workup or evaluation.  In the context of 3 syncopal/near syncopal episodes in the last year.  Possibly flow murmur versus aortic stenosis versus bicuspid aortic valve -Consider further history in a.m. -Consider echo  Lorrane Pac, MD 05/29/2024, 9:30 PM PGY-1, [facility] Service pager [phone]

## 2024-05-29 NOTE — Assessment & Plan Note (Signed)
 Hemoglobin 10.2 on admission and 9.0 after 2 L fluids.  Longstanding problem, unlikely acute bleeding.  Recent TSH 0.370. -Will further evaluate with reticulocyte count -AM CBC

## 2024-05-29 NOTE — Assessment & Plan Note (Signed)
 As noted on CMP.  Patient also with BMI of 19.  Likely in setting of uncontrolled diabetes and relatively poor nutrition. -Nutrition consult

## 2024-05-29 NOTE — ED Triage Notes (Signed)
 Pt sent from [facility] clinic for possible dehydration and DKA. Pt c.o generalized abd pain and diarrhea. Pt was hypotensive in the clinic

## 2024-05-29 NOTE — H&P (Cosign Needed Addendum)
 Hospital Admission History and Physical Service Pager: [phone]  Patient name: Shelby Brown Medical record number: [id number] Date of Birth: 04/23/1981 Age: 43 y.o. Gender: female  Primary Care Provider: Adele Song, MD  Consultants: None Code Status: Full Code, which was confirmed with family if patient unable to confirm   Preferred Emergency Contact: Contact Information     Name Relation Home Work Mobile   Brown,Shelby Spouse [phone]  [phone]   Shelby Brown Daughter [phone]     Shelby Brown Knee Daughter   [phone]    Chief Complaint: Hypotension  Differential and Medical Decision Making:  Shelby Brown is a 43 y.o. female presenting with hypotension and lower back/abdominal pain. Differential for this patient's presentation of this includes UTI (likely due to her history, suprapubic tenderness, UA and resultant decreased p.o. due to pain), pyelonephritis (somewhat less likely due to lack of fevers and true CVA tenderness, though she does have a history of this along with concerning UA and reduced p.o. intake), DKA (less likely given normal pH on VBG, borderline normal anion gap, along with relatively low her CBGs), and appendicitis or other abdominal pathology (less likely given CTAP findings).  Patient also with intermittent chest pain with a long duration which could be GERD (more likely given reported as burning sensation), anxiety, or true ACS (less likely though will evaluate with troponins and EKG). Assessment & Plan Acute kidney injury (HCC) UTI (urinary tract infection) Creatinine today 1.20 up from baseline of around 0.8.  Likely prerenal in the setting of difficult p.o. with pain from her UTI or, less likely, pyelonephritis. UA with suggestive findings. Reassuringly afebrile and comfortable on exam. While a little young for perimenopause, patient's report of dry vaginal mucosa could be contributing to recurrent UTIs given otherwise  reassuring structural findings on CTAP. -Status post 2 L LR boluses, will add on maintenance fluids for the next 12 hours and restart pending p.o. -Continue Rocephin  given previous urine culture sensitivities, detransition pending current urine culture results -Consider GU exam to further evaluate dry vaginal mucosa when patient amenable -Consider methenamine while attempting to gain better control of patient's T2DM -Tylenol  for pain -I's and O's -A.m. BMP Hypotension Resolved; normal BPs here in the ED even prior to fluid administration.  Feel there may be a machine discrepancy, though she does have clinical evidence of dehydration on exam today. -Continue to monitor vitals -Fluids as above T2DM (type 2 diabetes mellitus) (HCC) Recent A1c 2 months ago 14.6.  Unfortunately is not been taking her long-acting insulin  in the last 4 days given her current illness.  Reassuringly without acidosis on VBG, though she does have a slightly elevated BHB and borderline anion gap. -Start sliding scale insulin  for now while patient is not taking in good p.o. -Restart home glargine as able Normocytic anemia Hemoglobin 10.2 on admission and 9.0 after 2 L fluids.  Longstanding problem, unlikely acute bleeding.  Recent TSH 0.370. -Will further evaluate with reticulocyte count -AM CBC Diabetic polyneuropathy (HCC) Characteristic symptoms on exam with uncontrolled diabetes. -Start gabapentin  100 mg 3 times daily Chest pain Most likely GERD.  Vital stable and patient comfortable on exam. -EKG -Troponins -Pepcid  20 mg daily Hypoalbuminemia As noted on CMP.  Patient also with BMI of 19.  Likely in setting of uncontrolled diabetes and relatively poor nutrition. -Nutrition consult  FEN/GI: Carb modified diet as tolerated VTE Prophylaxis: SCDs given early ambulation  Disposition: Med-surg  History of Present Illness:  Shelby Brown is a 43 y.o. female presenting with  hypotension.  Her daughter,  Shelby Brown, is patient's preferred interpreter for the encounter.  Seen at clinic with low BP and high sugars. Has not had her insulin  over last few days since her stomach has been hurting, has not wanted her daughter to inject the insulin . Pain in the lower abdomen for 3-4 days. Pain in feet as well, feels like needles. Some nausea, no vomiting.  Diarrhea 2 days ago, happens every time she eats for a while this he is unsure of timeline). Bilateral flank pain along with lower back pain. No dysuria. Last week subjective fevers, never took temp.  Has not eaten or drank much. Ate nothing 2 days ago. Had half a burger yesterday. Has drank water and tea but very little.  Dark urine.  Daughter was sick last week with body aches, but no diarrhea.  Vaginal dryness. Wipes front to back after stools.  Daughter was sick last week but without GI symptoms.  She was brought to the ED from her clinic appointment today given hypotension to 71/50, elevated CBG to 262, and concerning UA for UTI.  She was trialed on oral p.o. without change in BP.  Once arrived in the ED, patient was afebrile without hypotension.  Creatinine was 1.2, WBC 10.6, platelets 501, BHB 1.66, pH from VBG 7.4, lipase 44, and CTAP with findings concerning for cystitis versus pyelonephritis.  She was given IVF and Rocephin  and called for admission.  Review Of Systems: Per HPI.  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    Pertinent Family History: Father - deceased, cancer Mother - alive, healthy Two sisters alive with diabetes Two brothers alive, healthy Remainder reviewed in history tab.    Listed Important Outpatient Medications: Citalopram  20 mg tablet daily (not  taking) Famotidine  20 mg tablet daily (not taking) Tylenol  500 mg Q6H prn pain Basaglar  24 units daily Zofran  8 mg Q8H prn nausea (not taking)  Objective: BP (!) 152/88 (BP Location: Right Arm)   Pulse 90   Temp 98.4 F (36.9 C) (Oral)   Resp 17   LMP  (LMP Unknown)   SpO2 100%  Exam: General: Alert, no acute distress Eyes: Anicteric, EOM grossly normal ENTM: Dry mucous membranes, halitosis, poor dentition Neck: Good range of motion grossly Cardiovascular: Regular rate and rhythm, soft systolic flow murmur appreciated best at left upper sternal border Respiratory/Back: Clear to auscultation bilaterally, speaking and breathing comfortably on room air Gastrointestinal: Soft, nondistended, moderate tenderness over suprapubic area with milder tenderness in central abdomen, hyperactive bowel sounds MSK: No CVA tenderness bilaterally, moves all extremities grossly equally, 2+ dorsalis pedis pulses in bilateral feet Neuro: No gross focal deficit, numbness and bilateral dorsal feet with pain when palpating bilateral soles Psych: Appropriate mood and affect  Labs:  CBC BMET  Recent Labs  Lab 05/29/24 1128  WBC 10.6*  HGB 9.0*  HCT 29.2*  PLT 501*   Recent Labs  Lab 05/29/24 1128  NA 130*  K 4.7  CL 98  CO2 20*  BUN 26*  CREATININE 1.20*  GLUCOSE 304*  CALCIUM 8.8*     Urinalysis    Component Value Date/Time   COLORURINE AMBER (A) 05/29/2024 1152   APPEARANCEUR CLOUDY (A) 05/29/2024 1152   APPEARANCEUR Clear 12/31/2023 1448  LABSPEC 1.014 05/29/2024 1152   PHURINE 5.0 05/29/2024 1152   GLUCOSEU 150 (A) 05/29/2024 1152   GLUCOSEU > 1000 mg/dL (A) 96/75/7990 7855   HGBUR SMALL (A) 05/29/2024 1152   BILIRUBINUR NEGATIVE 05/29/2024 1152   BILIRUBINUR moderate (A) 05/29/2024 1040   BILIRUBINUR Negative 12/31/2023 1448   KETONESUR 5 (A) 05/29/2024 1152   KETONESUR small (15) (A) 05/29/2024 1040   PROTEINUR >=300 (A) 05/29/2024 1152   PROTEINUR >=300 (A) 05/29/2024  1040   UROBILINOGEN 1.0 05/29/2024 1040   UROBILINOGEN 0.2 11/28/2014 1725   NITRITE NEGATIVE 05/29/2024 1152   NITRITE Negative 05/29/2024 1040   LEUKOCYTESUR MODERATE (A) 05/29/2024 1152   LEUKOCYTESUR Small (1+) (A) 05/29/2024 1040   BHB 1.66 Negative quant hCG Lipase 44  EKG: pending  Imaging Studies Performed:  CTAP IMPRESSION: 1. Nonspecific hypoattenuation involving the bilateral kidneys may represent pyelonephritis in the appropriate clinical setting. Recommend correlation with urinalysis. Query possible cystitis.  Tharon Lung, MD 05/29/2024, 5:18 PM PGY-3, [facility]  FPTS Intern pager: [phone], text pages welcome Secure chat group [facility] Teaching Service

## 2024-05-29 NOTE — Assessment & Plan Note (Signed)
 Attempted p.o. hydration in clinic, with no improvement of blood pressure.  Gave 20 units of her home Basaglar , monitored for 45 minutes.  Only minimal improvement of symptoms. Given history of DKA, continued hypertension and current CBG of 262, recommend ED evaluation for DKA rule out.  UA showed hemoglobin/leukocytes, but sample was insufficient for microscopy/culture. - Patient will go to ED by personal vehicle - Reapplied CGM - Will schedule follow-up within the next 1 week

## 2024-05-29 NOTE — ED Provider Notes (Signed)
 [place] EMERGENCY DEPARTMENT AT [facility] Provider Note   CSN: [id number] Arrival date & time: 05/29/24  1102     Patient presents with: Abdominal Pain and Hypotension   Shelby Brown is a 43 y.o. female.   43 year old female presenting from her primary care provider office this morning with hypotension.  Was found to have BP 71/50 at her PCP office this morning, was instructed to come to the emergency department.  She endorses dizziness that is exacerbated by going from a seated to a standing position.  Patient describes abdominal pain that is worse when you press on it, had episode of diarrhea 2 days ago but none since, no vomiting but has been nauseated.  Patient is diabetic and on insulin , she admits that she has not used her insulin  in 4 days because it burns when I put it in.  She was found to be hyperglycemic at her PCP office this morning as well.  She endorses oliguria, denies dysuria.  She reports last week she had a fever every day, none since.  Denies shortness of breath, chest pain.   Abdominal Pain      Prior to Admission medications   Medication Sig Start Date End Date Taking? Authorizing Provider  acetaminophen  (TYLENOL ) 500 MG tablet Take 500 mg by mouth every 6 (six) hours as needed.    [provider]  citalopram  (CELEXA ) 20 MG tablet Take 1 tablet (20 mg total) by mouth daily. Patient not taking: Reported on 05/29/2024 03/15/24   Theophilus Pagan, MD  famotidine  (PEPCID ) 20 MG tablet Take 1 tablet (20 mg total) by mouth daily. Patient not taking: Reported on 05/29/2024 03/15/24   Theophilus Pagan, MD  Insulin  Glargine (BASAGLAR  [facility]) 100 UNIT/ML Inject 24 Units into the skin daily. 05/29/24 07/28/24  McDiarmid, Krystal BIRCH, MD  Insulin  Pen Needle 31G X 8 MM MISC Use one pen needle twice daily. 05/29/24   McDiarmid, Krystal BIRCH, MD  ondansetron  (ZOFRAN -ODT) 8 MG disintegrating tablet Take 1 tablet (8 mg total) by mouth every 8 (eight) hours  as needed for nausea or vomiting. Patient not taking: Reported on 05/29/2024 03/15/24   Theophilus Pagan, MD  promethazine  (PHENERGAN ) 6.25 MG/5ML syrup Take 5 mLs (6.25 mg total) by mouth 4 (four) times daily as needed for nausea or vomiting. Patient not taking: Reported on 11/16/2020 09/13/20 11/19/20  Lue Elsie BROCKS, MD    Allergies: Metformin  and related    Review of Systems  Gastrointestinal:  Positive for abdominal pain.    Updated Vital Signs  Vitals:   05/29/24 1506 05/29/24 1509 05/29/24 1530 05/29/24 1730  BP:  (!) 152/88 (!) 160/78   Pulse: 91 90 85 92  Resp:  17    Temp:  98.4 F (36.9 C)    TempSrc:  Oral    SpO2: 100% 100% 100% 100%     Physical Exam Vitals and nursing note reviewed.  HENT:     Head: Normocephalic.  Eyes:     Extraocular Movements: Extraocular movements intact.  Cardiovascular:     Rate and Rhythm: Normal rate and regular rhythm.  Pulmonary:     Effort: Pulmonary effort is normal.     Breath sounds: Normal breath sounds.  Abdominal:     Palpations: Abdomen is soft.     Tenderness: There is abdominal tenderness (Mild TTP in epigastric region). There is no guarding.  Musculoskeletal:     Cervical back: Normal range of motion.     Comments: Moves all extremity  spontaneously without difficulty  Skin:    General: Skin is warm and dry.  Neurological:     General: No focal deficit present.     Mental Status: She is alert and oriented to person, place, and time.     (all labs ordered are listed, but only abnormal results are displayed) Labs Reviewed  COMPREHENSIVE METABOLIC PANEL WITH GFR - Abnormal; Notable for the following components:      Result Value   Sodium 130 (*)    CO2 20 (*)    Glucose, Bld 304 (*)    BUN 26 (*)    Creatinine, Ser 1.20 (*)    Calcium 8.8 (*)    Albumin 2.8 (*)    AST <10 (*)    GFR, Estimated 57 (*)    All other components within normal limits  CBC - Abnormal; Notable for the following components:    WBC 10.6 (*)    RBC 3.41 (*)    Hemoglobin 9.0 (*)    HCT 29.2 (*)    Platelets 501 (*)    All other components within normal limits  URINALYSIS, ROUTINE W REFLEX MICROSCOPIC - Abnormal; Notable for the following components:   Color, Urine AMBER (*)    APPearance CLOUDY (*)    Glucose, UA 150 (*)    Hgb urine dipstick SMALL (*)    Ketones, ur 5 (*)    Protein, ur >=300 (*)    Leukocytes,Ua MODERATE (*)    Bacteria, UA MANY (*)    Non Squamous Epithelial 0-5 (*)    All other components within normal limits  BETA-HYDROXYBUTYRIC ACID - Abnormal; Notable for the following components:   Beta-Hydroxybutyric Acid 1.66 (*)    All other components within normal limits  CBG MONITORING, ED - Abnormal; Notable for the following components:   Glucose-Capillary 292 (*)    All other components within normal limits  I-STAT CHEM 8, ED - Abnormal; Notable for the following components:   Sodium 130 (*)    BUN 27 (*)    Creatinine, Ser 1.20 (*)    Glucose, Bld 310 (*)    Calcium, Ion 1.10 (*)    TCO2 21 (*)    Hemoglobin 9.9 (*)    HCT 29.0 (*)    All other components within normal limits  I-STAT VENOUS BLOOD GAS, ED - Abnormal; Notable for the following components:   pCO2, Ven 31.9 (*)    Acid-base deficit 3.0 (*)    Sodium 129 (*)    Calcium, Ion 1.12 (*)    HCT 30.0 (*)    Hemoglobin 10.2 (*)    All other components within normal limits  CBG MONITORING, ED - Abnormal; Notable for the following components:   Glucose-Capillary 355 (*)    All other components within normal limits  URINE CULTURE  LIPASE, BLOOD  HCG, SERUM, QUALITATIVE  CBG MONITORING, ED  CBG MONITORING, ED  CBG MONITORING, ED  CBG MONITORING, ED    EKG: None  Radiology: CT ABDOMEN PELVIS W CONTRAST Result Date: 05/29/2024 CLINICAL DATA:  Abdominal pain, hypotension, diarrhea EXAM: CT ABDOMEN AND PELVIS WITH CONTRAST TECHNIQUE: Multidetector CT imaging of the abdomen and pelvis was performed using the standard  protocol following bolus administration of intravenous contrast. RADIATION DOSE REDUCTION: This exam was performed according to the departmental dose-optimization program which includes automated exposure control, adjustment of the mA and/or kV according to patient size and/or use of iterative reconstruction technique. CONTRAST:  80mL OMNIPAQUE  IOHEXOL  350 MG/ML SOLN  COMPARISON:  March 18, 2024 CT and prior studies FINDINGS: Lower chest: Bibasilar subsegmental atelectasis. Hepatobiliary: No suspicious liver lesion. No gallstones, gallbladder wall thickening, or biliary dilatation. Pancreas: Unremarkable. Spleen: Unremarkable. Adrenals/Urinary Tract: Adrenal glands are unremarkable. No hydronephrosis. No nephrolithiasis. Nonspecific geographic hypoattenuation in mid right kidney and upper pole left kidney. Mild nonspecific inflammatory stranding along the anterior wall the bladder, similar to prior. Stomach/Bowel: No evidence of bowel obstruction or inflammation. Appendix is unremarkable. Vascular/Lymphatic: Normal caliber aorta. No lymphadenopathy by size criteria. Reproductive: Bilateral tubal ligation clips in place. Other: No free air or free fluid. Musculoskeletal: No acute osseous findings. IMPRESSION: 1. Nonspecific hypoattenuation involving the bilateral kidneys may represent pyelonephritis in the appropriate clinical setting. Recommend correlation with urinalysis. Query possible cystitis. Electronically Signed   By: Michaeline Blanch M.D.   On: 05/29/2024 16:31     Procedures   Medications Ordered in the ED  cefTRIAXone  (ROCEPHIN ) 1 g in sodium chloride  0.9 % 100 mL IVPB (1 g Intravenous New Bag/Given 05/29/24 1731)  lactated ringers  bolus 1,000 mL (0 mLs Intravenous Stopped 05/29/24 1659)  iohexol  (OMNIPAQUE ) 350 MG/ML injection 80 mL (80 mLs Intravenous Contrast Given 05/29/24 1616)  lactated ringers  bolus 1,000 mL (1,000 mLs Intravenous New Bag/Given 05/29/24 1716)                                     Medical Decision Making This patient presents to the ED for concern of hypotension and hyperglycemia, this involves an extensive number of treatment options, and is a complaint that carries with it a high risk of complications and morbidity.  The differential diagnosis includes hypovolemia, diabetic ketoacidosis, starvation ketosis, urinary tract infection, pyelonephritis, acute kidney injury   Co morbidities that complicate the patient evaluation  Diabetes on insulin    Additional history obtained:  Additional history obtained from record review External records from outside source obtained and reviewed including PCP note from earlier today   Lab Tests:  I Ordered, and personally interpreted labs.  The pertinent results include: CBC notable for mild leukocytosis of 10.6, hemoglobin of 9 is similar to patient's baseline.  CMP notable for hyponatremia with sodium of 130, creatinine of 1.2 is above patient's most recent baseline of 0.82 approximately 2 months ago.  VBG is without acidosis.  Serum hCG negative.  Serum ketones elevated at 1.66.  Urinalysis notable for moderate leukocytes with many bacteria, this may be representative of contaminant given 21-50 squamous epithelial cells present, will send for culture.  Lipase within normal limits.   Imaging Studies ordered:  I ordered imaging studies including CT abdomen/pelvis I independently visualized and interpreted imaging which showed 1. Nonspecific hypoattenuation involving the bilateral kidneys may represent pyelonephritis in the appropriate clinical setting. Recommend correlation with urinalysis. Query possible cystitis.  I agree with the radiologist interpretation   Cardiac Monitoring: / EKG:  The patient was maintained on a cardiac monitor.  I personally viewed and interpreted the cardiac monitored which showed an underlying rhythm of: NSR   Consultations Obtained:  I requested consultation with the hospitalist,  and  discussed lab and imaging findings as well as pertinent plan - they recommend: I spoke with the family medicine service, Dr. Lennie, who agrees that this patient is appropriate for admission   Problem List / ED Course / Critical interventions / Medication management  I ordered medication including LR bolus x 2 for rehydration, Rocephin  for pyelonephritis Reevaluation of  the patient after these medicines showed that the patient improved I have reviewed the patients home medicines and have made adjustments as needed   Social Determinants of Health:  Depression, housing instability, unmet transportation needs   Test / Admission - Considered:  Physical exam is notable as above.  I suspect the patient's mild hyponatremia and elevated creatinine are secondary to poor p.o. intake over the last several days.  She was found to have elevations in her serum ketones, however VBG is without acidosis, therefore her clinical picture is not truly representative of diabetic ketoacidosis.  Patient received rehydration with LR fluid boluses.  CT abdomen/pelvis notable as above.  Patient denies dysuria/flank pain, however she did note a fever multiple days last week as well as poor p.o. intake and generalized weakness.  IV Rocephin  initiated for suspected pyelonephritis.  I spoke with the family medicine service in regard to this patient, as she was seen at their clinic earlier today, patient is appropriate for admission at this time.    Amount and/or Complexity of Data Reviewed Labs: ordered. Radiology: ordered.  Risk Prescription drug management. Decision regarding hospitalization.        Final diagnoses:  Ketosis due to diabetes [facility])  Pyelonephritis  Acute kidney injury [facility])    ED Discharge Orders     None          Glendia Rocky SAILOR, PA-C 05/29/24 1740    Ruthe Cornet, DO 05/30/24 0818

## 2024-05-29 NOTE — Assessment & Plan Note (Signed)
 Most likely GERD.  Vital stable and patient comfortable on exam. -EKG -Troponins -Pepcid  20 mg daily

## 2024-05-29 NOTE — Assessment & Plan Note (Signed)
 Diabetes longstanding currently uncontrolled. Medication adherence appears poor - has not taken insulin  in 4 days and when insulin  is used it leaves a burning sensation at the site of injection. Control is suboptimal due to poor medication adherence and general unwell feeling. -Increased Basaglar  (insulin  glargine) from 20 units once daily to 24 units once daily -Strongly consider adding mealtime insulin  at upcoming visit if BG not improved -Burning sensation when using insulin  - discussed this may be due to administration technique.  -Daughter provided 20 units of insulin  in clinic after BG finger stick  -Discussed eating a significant sized snack midday to prevent low BG that is a recurrent issue  -Patient educated on purpose, proper use, and potential adverse effects.  -Extensively discussed pathophysiology of diabetes, recommended lifestyle interventions, dietary effects on blood sugar control.  -Counseled on s/sx of and management of hypoglycemia.  -Provided 3 new Dexcom G7 sensors  -Consulted Dr. Howell for generalized pain and fatigue accompanied by low pressure. Dr. Rumball assessed patient as well.

## 2024-05-29 NOTE — Patient Instructions (Addendum)
  Qu bueno verte hoy!  Tu nivel ideal de azcar en sangre es de 80-130 antes de comer y menos de 180 despus de comer.  Cambios en la medicacin:  Aumenta la insulina Basaglar  (insulina glargina) de 20 unidades una vez al da a 24 unidades una vez al da.  Contina con la medicacin.  Intenta comer un refrigerio abundante al medioda; tus niveles de azcar suelen bajar alrededor del medioda y comer algo te ayudar.  Sigue con la buena alimentacin y el ejercicio. Intenta llevar una dieta rica en verduras, frutas y carnes magras (pollo, pavo, pescado). Intenta limitar el consumo de sal comiendo verduras frescas o congeladas (en lugar de enlatadas), enjuaga las verduras enlatadas antes de cocinarlas y no aadas sal a las comidas.  It was nice to see you today!  Your goal blood sugar is 80-130 before eating and less than 180 after eating.  Medication Changes:  Increase Basaglar  (insulin  glargine) insulin  from 20 units once a day to 24 units once a day   Continue all other medication the same.  Try to eat a significant snack in the middle of the day - your sugars usually get low around noon and eating something will help this.   Try to drink as much water as possible during the day.   Keep up the good work with diet and exercise. Aim for a diet full of vegetables, fruit and lean meats (chicken, [place], fish). Try to limit salt intake by eating fresh or frozen vegetables (instead of canned), rinse canned vegetables prior to cooking and do not add any additional salt to meals.

## 2024-05-30 ENCOUNTER — Inpatient Hospital Stay (HOSPITAL_COMMUNITY): Payer: Self-pay

## 2024-05-30 ENCOUNTER — Encounter (HOSPITAL_COMMUNITY): Payer: Self-pay | Admitting: Family Medicine

## 2024-05-30 DIAGNOSIS — R1011 Right upper quadrant pain: Secondary | ICD-10-CM

## 2024-05-30 DIAGNOSIS — N179 Acute kidney failure, unspecified: Secondary | ICD-10-CM

## 2024-05-30 DIAGNOSIS — E440 Moderate protein-calorie malnutrition: Secondary | ICD-10-CM | POA: Insufficient documentation

## 2024-05-30 LAB — GLUCOSE, CAPILLARY
Glucose-Capillary: 106 mg/dL — ABNORMAL HIGH (ref 70–99)
Glucose-Capillary: 159 mg/dL — ABNORMAL HIGH (ref 70–99)
Glucose-Capillary: 196 mg/dL — ABNORMAL HIGH (ref 70–99)
Glucose-Capillary: 302 mg/dL — ABNORMAL HIGH (ref 70–99)
Glucose-Capillary: 45 mg/dL — ABNORMAL LOW (ref 70–99)
Glucose-Capillary: 68 mg/dL — ABNORMAL LOW (ref 70–99)
Glucose-Capillary: 99 mg/dL (ref 70–99)

## 2024-05-30 LAB — HEMOGLOBIN A1C
Hgb A1c MFr Bld: 8.8 % — ABNORMAL HIGH (ref 4.8–5.6)
Mean Plasma Glucose: 205.86 mg/dL

## 2024-05-30 LAB — BASIC METABOLIC PANEL WITH GFR
Anion gap: 10 (ref 5–15)
BUN: 21 mg/dL — ABNORMAL HIGH (ref 6–20)
CO2: 23 mmol/L (ref 22–32)
Calcium: 8.5 mg/dL — ABNORMAL LOW (ref 8.9–10.3)
Chloride: 101 mmol/L (ref 98–111)
Creatinine, Ser: 0.92 mg/dL (ref 0.44–1.00)
GFR, Estimated: >60 mL/min (ref >60)
Glucose, Bld: 192 mg/dL — ABNORMAL HIGH (ref 70–99)
Potassium: 4.5 mmol/L (ref 3.5–5.1)
Sodium: 134 mmol/L — ABNORMAL LOW (ref 135–145)

## 2024-05-30 LAB — IRON AND TIBC
Iron: 25 ug/dL — ABNORMAL LOW (ref 28–170)
Saturation Ratios: 8 % — ABNORMAL LOW (ref 10.4–31.8)
TIBC: 302 ug/dL (ref 250–450)
UIBC: 277 ug/dL

## 2024-05-30 LAB — CBC
HCT: 25.5 % — ABNORMAL LOW (ref 36.0–46.0)
Hemoglobin: 8.3 g/dL — ABNORMAL LOW (ref 12.0–15.0)
MCH: 26.5 pg (ref 26.0–34.0)
MCHC: 32.5 g/dL (ref 30.0–36.0)
MCV: 81.5 fL (ref 80.0–100.0)
Platelets: 495 K/uL — ABNORMAL HIGH (ref 150–400)
RBC: 3.13 MIL/uL — ABNORMAL LOW (ref 3.87–5.11)
RDW: 13.8 % (ref 11.5–15.5)
WBC: 9.8 K/uL (ref 4.0–10.5)
nRBC: 0 % (ref 0.0–0.2)

## 2024-05-30 LAB — RETICULOCYTES
Immature Retic Fract: 9 % (ref 2.3–15.9)
RBC.: 3.11 MIL/uL — ABNORMAL LOW (ref 3.87–5.11)
Retic Count, Absolute: 12.4 K/uL — ABNORMAL LOW (ref 19.0–186.0)
Retic Ct Pct: 0.4 % (ref 0.4–3.1)

## 2024-05-30 LAB — FERRITIN: Ferritin: 42 ng/mL (ref 11–307)

## 2024-05-30 LAB — TROPONIN I (HIGH SENSITIVITY): Troponin I (High Sensitivity): 3 ng/L (ref <18)

## 2024-05-30 LAB — VITAMIN B12: Vitamin B-12: 851 pg/mL (ref 180–914)

## 2024-05-30 MED ORDER — INSULIN GLARGINE 100 UNIT/ML ~~LOC~~ SOLN
5.0000 [IU] | Freq: Every day | SUBCUTANEOUS | Status: DC
  Filled 2024-05-30: qty 0.05

## 2024-05-30 MED ORDER — INSULIN ASPART 100 UNIT/ML IJ SOLN
0.0000 [IU] | Freq: Three times a day (TID) | INTRAMUSCULAR | Status: DC
  Administered 2024-05-30: 2 [IU] via SUBCUTANEOUS
  Administered 2024-05-31: 5 [IU] via SUBCUTANEOUS
  Administered 2024-05-31: 2 [IU] via SUBCUTANEOUS

## 2024-05-30 MED ORDER — INSULIN ASPART 100 UNIT/ML IJ SOLN
0.0000 [IU] | Freq: Every day | INTRAMUSCULAR | Status: DC
  Administered 2024-05-30: 4 [IU] via SUBCUTANEOUS

## 2024-05-30 MED ORDER — CEFTRIAXONE SODIUM 2 G IJ SOLR: 2.0000 g | INTRAMUSCULAR | Status: DC

## 2024-05-30 MED ORDER — SODIUM CHLORIDE 0.9 % IV SOLN
1.0000 g | Freq: Once | INTRAVENOUS | Status: AC
  Administered 2024-05-30: 1 g via INTRAVENOUS
  Filled 2024-05-30: qty 10

## 2024-05-30 MED ORDER — INSULIN GLARGINE 100 UNIT/ML ~~LOC~~ SOLN
5.0000 [IU] | Freq: Every day | SUBCUTANEOUS | Status: DC
  Administered 2024-05-30: 5 [IU] via SUBCUTANEOUS
  Filled 2024-05-30 (×2): qty 0.05

## 2024-05-30 MED ORDER — FERROUS SULFATE 325 (65 FE) MG PO TABS
325.0000 mg | ORAL_TABLET | ORAL | Status: DC
  Administered 2024-05-30: 325 mg via ORAL
  Filled 2024-05-30: qty 1

## 2024-05-30 MED ORDER — OXYCODONE HCL 5 MG PO TABS
5.0000 mg | ORAL_TABLET | Freq: Four times a day (QID) | ORAL | Status: DC | PRN
  Administered 2024-05-30: 5 mg via ORAL
  Filled 2024-05-30: qty 1

## 2024-05-30 NOTE — Progress Notes (Signed)
 Reviewed and agree with Dr Rennis plan.

## 2024-05-30 NOTE — Assessment & Plan Note (Signed)
 UA suggestive of UTI, growing gram-negative rods on UCX pending sensitivities.  Recurrent issue possibly 2/2 vaginal dryness/atrophy. -- 1 more dose Rocephin  2g then transition to cefuroxime  500 mg twice daily for 3 days, can adjust pending sensitivities - Methenamine at discharge after antibiotic course -- vaginal estrogen at discharge  - CBC stable

## 2024-05-30 NOTE — Assessment & Plan Note (Addendum)
 Characteristic symptoms on exam with uncontrolled diabetes. -Continue gabapentin  100 mg 3 times daily

## 2024-05-30 NOTE — Assessment & Plan Note (Addendum)
 A1C yesterday was 8.8. Patient transitioned to sensitive SSI. Most recent BG in 150s. Will restart home long-acting at lower dose due to recent drops in blood sugar. --Continue sensitive SSI --Insulin  Aspart - Restart glargine 5 units daily

## 2024-05-30 NOTE — Progress Notes (Signed)
 Initial Nutrition Assessment  DOCUMENTATION CODES:   Non-severe (moderate) malnutrition in context of acute illness/injury  INTERVENTION:  Glucerna Shake po TID, each supplement provides 220 kcal and 10 grams of protein  Magic cup TID with meals, each supplement provides 290 kcal and 9 grams of protein  Educated on Diabetes Nutrition Therapy   Encouraged intake of small, frequent meals to help with nausea, stomach upset, and diarrhea.  Will monitor results of RUQ ultrasound  NUTRITION DIAGNOSIS:   Moderate Malnutrition related to acute illness as evidenced by mild fat depletion, mild muscle depletion.  GOAL:   Patient will meet greater than or equal to 90% of their needs  MONITOR:   PO intake, Supplement acceptance  REASON FOR ASSESSMENT:   Consult Assessment of nutrition requirement/status, Diet education  ASSESSMENT:   Pt with hx of type 2 diabetes, anemia, AKI, and DKA. Pt admitted with hypotension, diagnosed AKI secondary to UTI.  9/11 admitted  Pt's last reported A1c 14.6, A1c today 8.8. Reportedly was not taking insulin  doses PTA. Consulted for assessment and diabetes education.   Spoke with pt and pt's husband. Pt reports poor appetite due to stomach issues. States she has diarrhea and feels bloated after every meal. Pt agreeable to ONS to help intake, discussed Glucerna and magic cups.  Pt reports poor intake 1 month prior to admission. States she would eat small bites of food throughout the day but could not tolerate full meals. Pt reports she would eat a variety of foods like rice, beans, tortillas, eggs, chicken, beef and pork. Pt reports recently cutting out Coke and and juice and switching only to water and occasionally tea. Pt reports having issues with diarrhea after eating prior to admission. Unable to pinpoint if certain foods cause worse symptoms. Pt reports taking insulin  recently as prescribed which may explain most recent A1c showing improvement.  Discussed importance of protein intake at every meal even when pt does not eat much. Discussed importance of consistent meal intake to help manage blood sugars and the importance of fruits, vegetables and whole grains for fiber. Pt appears to experience hypoglycemic and hyperglycemic events regularly which may explain pt's dizziness spells.   Pt reports recent wt gain of 14# due to holding fluid in legs but then states she has since lost that wt and suspects she has lost further. States UBW of 116#, pt's wt at admit is 114#. No edema on exam but exam shows mild to moderate muscle depletion and mild fat depletion. Suspect depletions due to under nutrition in the context of acute illness from onset of UTI symptoms and GI symptoms. Will monitor wt trends and encourage adequate intake.   Medications reviewed and include:  Pepcid  Novolog  SSI 4x daily Ceftriaxone   Labs reviewed:  Sodium 134 BUN 21 CBG x 24 hr: 45-355 mg/dL J8r 8.8   NUTRITION - FOCUSED PHYSICAL EXAM:  Flowsheet Row Most Recent Value  Orbital Region Mild depletion  Upper Arm Region Mild depletion  Thoracic and Lumbar Region Mild depletion  Buccal Region Mild depletion  Temple Region Mild depletion  Clavicle Bone Region Mild depletion  Clavicle and Acromion Bone Region Mild depletion  Scapular Bone Region Mild depletion  Dorsal Hand Mild depletion  Patellar Region Moderate depletion  Anterior Thigh Region Moderate depletion  Posterior Calf Region Moderate depletion  Edema (RD Assessment) None  Hair Reviewed  Eyes Reviewed  Mouth Reviewed  Skin Reviewed  Nails Reviewed    Diet Order:   Diet Order  Diet NPO time specified Except for: Sips with Meds  Diet effective midnight                   EDUCATION NEEDS:   Education needs have been addressed  Skin:  Skin Assessment: Reviewed RN Assessment  Last BM:  9/12 type 5 and 6  Height:   Ht Readings from Last 1 Encounters:  05/30/24 5' 2  (1.575 m)    Weight:   Wt Readings from Last 1 Encounters:  05/30/24 52 kg    Ideal Body Weight:  50 kg  BMI:  Body mass index is 20.97 kg/m.  Estimated Nutritional Needs:   Kcal:  1400-1600  Protein:  55-75g  Fluid:  1.4-1.6L    Josette Glance, MS, RDN, LDN Clinical Dietitian I Please reach out via secure chat

## 2024-05-30 NOTE — Assessment & Plan Note (Addendum)
 Hemoglobin 10.2 on admission and now 8.3 Appears a longstanding problem, unlikely acute bleeding. Retic count decreased suggesting low RBC production, possibly related to poor nutritional intake. -AM CBC --Ferrous sulfate  325 mg daily

## 2024-05-30 NOTE — Progress Notes (Signed)
     Daily Progress Note Intern Pager: [phone]  Patient name: Shelby Brown Medical record number: [id number] Date of birth: Apr 18, 1981 Age: 43 y.o. Gender: female  Primary Care Provider: Adele Song, MD Consultants: None Code Status: Full  Pt Overview and Major Events to Date:  9/10-admitted to FMTS  Assessment: Very accomplished by me this is a 43 year old female with past medical history of poorly controlled T2DM and recurrent UTIs presenting with hypotension and lower back abdominal pain admitted for UTI and AKI. Assessment & Plan Acute kidney injury (HCC) (Resolved: 05/30/2024) UTI (urinary tract infection) UA suggestive of UTI, continuing to await UCX.  Recurrent issue possibly 2/2 vaginal dryness/atrophy. -- Continue Rocephin  2g, detransition pending urine culture results - Add on methenamine to continue outpatient -- Add on vaginal estrogen to continue outpatient T2DM (type 2 diabetes mellitus) (HCC) On sensitive SSI. Most recent BG in 150s with moderate control, will continue low-dose home long-acting given decreased p.o and recent hypoglycemia. --Continue sensitive SSI - *** glargine 5 units daily Chest pain (Resolved: 05/30/2024) Right upper quadrant abdominal pain (Resolved: 05/30/2024) Patient reporting generalized abdominal pain, worsened post-prandially. ACS workup unremarkable.  -Continue Pepcid  20 mg daily --RUQ ultrasound Chronic health problem GERD -negative ACS and RUQ workup, continue 20 mg famotidine  daily Hypoproliferative normocytic anemia -continue ferrous sulfate  325 mg daily  Diabetic polyneuropathy -continue gabapentin  100 mg 3 times daily Moderate malnutrition -nutrition consulted   FEN/GI: Carb modified diet as tolerated PPx: SCDs and ambulation Dispo:Home pending clinical improvement . Barriers include pain control.   Subjective:  ***  Objective: Temp:  [98.4 F (36.9 C)-99.5 F (37.5 C)] 98.4 F (36.9 C) (09/12 1932) Pulse  Rate:  [84-101] 84 (09/12 1932) Resp:  [15-18] 18 (09/12 1932) BP: (105-154)/(65-81) 105/75 (09/12 1932) SpO2:  [98 %-100 %] 100 % (09/12 1932) Weight:  [52 kg] 52 kg (09/12 0040) Physical Exam: General: *** Cardiovascular: *** Respiratory: *** Abdomen: *** Extremities: ***  Laboratory: Most recent CBC Lab Results  Component Value Date   WBC 9.8 05/30/2024   HGB 8.3 (L) 05/30/2024   HCT 25.5 (L) 05/30/2024   MCV 81.5 05/30/2024   PLT 495 (H) 05/30/2024   Most recent BMP    Latest Ref Rng & Units 05/30/2024    5:36 AM  BMP  Glucose 70 - 99 mg/dL 807   BUN 6 - 20 mg/dL 21   Creatinine 9.55 - 1.00 mg/dL 9.07   Sodium 864 - 854 mmol/L 134   Potassium 3.5 - 5.1 mmol/L 4.5   Chloride 98 - 111 mmol/L 101   CO2 22 - 32 mmol/L 23   Calcium 8.9 - 10.3 mg/dL 8.5     Other pertinent labs ***   Imaging/Diagnostic Tests: Abdomen/RUQ ultrasound IMPRESSION: 1. Minimal debris/sludge in the gallbladder. No gallstones or evidence of cholecystitis. 2. Otherwise unremarkable right upper quadrant ultrasound.  Lorrane Pac, MD 05/30/2024, 11:01 PM  PGY-1, [facility] FPTS Intern pager: [phone], text pages welcome Secure chat group [facility] Teaching Service

## 2024-05-30 NOTE — Assessment & Plan Note (Signed)
 As noted on CMP.  Patient also with BMI of 19.  Likely in setting of uncontrolled diabetes and relatively poor nutrition. -Nutrition consulted

## 2024-05-30 NOTE — Assessment & Plan Note (Deleted)
 Resolved; normal BPs here in the ED even prior to fluid administration.  Feel there may be a machine discrepancy, though she does have clinical evidence of dehydration on exam today. -Continue to monitor vitals -Fluids as above

## 2024-05-30 NOTE — Assessment & Plan Note (Signed)
 Patient reporting generalized abdominal pain, worsened post-prandially. ACS workup unremarkable.  -Continue Pepcid  20 mg daily --RUQ ultrasound

## 2024-05-30 NOTE — Assessment & Plan Note (Signed)
 GERD -negative ACS and RUQ workup, continue 20 mg famotidine  daily Hypoproliferative normocytic anemia -continue ferrous sulfate  325 mg daily  Diabetic polyneuropathy -continue gabapentin  100 mg 3 times daily Moderate malnutrition -nutrition consulted

## 2024-05-30 NOTE — Assessment & Plan Note (Addendum)
 Cr initially elevated d/t prerenal cause, but has normalized to 0.92 today. UA suggestive of UTI, continuing to await UCX. Patient afebrile, but reporting ongoing suprapubic pain, CVA tenderness, and generalized abdominal pain. --Increased Rocephin  to 2g, detransition pending urine culture results --AM BMP -Tylenol , Oxycodone  PRN -Consider methenamine while attempting to gain better control of patient's T2DM --Consider pelvic exam given sx of vaginal dryness, recurrent UTIs

## 2024-05-30 NOTE — Hospital Course (Addendum)
 Nataliya Graig is a 43 y.o.female with a history of poorly-controlled T2DM and recurrent UTI's who was admitted to the [facility] Medicine Teaching Service at [facility] for UTI and AKI. Her hospital course is detailed below:  UTI Patient presented with suprapubic tenderness, nausea, bilateral flank pain, dry vaginal mucosa, and reduced PO intake secondary to pain. Urinalysis was suggestive of recurrent UTI. Patient was started on Ceftriaxone  (9/11-***) pending sensitivities. Once urine cultures were available, she was transitioned to ***.   Acute Kidney Injury On admission, Cr was found to be 1.20, up from baseline of 0.8. Cr returned to baseline with fluid resuscitation.  Type 2 Diabetes Mellitus Was not taking long-acting insulin  for 4 days due to sick symptoms, hyperglycemic to 200-300's in ED.  Gradually increased home insulin  regimen with improving p.o. intake.  ***.  Normocytic Anemia Malnutrition of moderate degree Hemoglobin 10.2 on admission with long-standing anemia and decreased reticulocyte count, most likely secondary to poor nutritional intake. Nutrition was consulted. Patient started on daily ferrous sulfate  supplementation, nutritional shakes, and received education on diabetes nutrition.  RUQ Pain Patient reported generalized post-prandial abdominal pain. RUQ ultrasound was unremarkable. Patient started on antiacid for GERD.  Other chronic conditions were medically managed with home medications and formulary alternatives as necessary ()  PCP Follow-up Recommendations:  Consider vaginal estrogen for recurrent UTI

## 2024-05-30 NOTE — Assessment & Plan Note (Signed)
 On sensitive SSI. Most recent BG in 150s with moderate control, will continue low-dose home long-acting given decreased p.o and recent hypoglycemia. --Continue sensitive SSI - *** glargine 5 units daily

## 2024-05-30 NOTE — Plan of Care (Signed)
  Problem: Nutritional: Goal: Maintenance of adequate nutrition will improve Outcome: Progressing   Problem: Skin Integrity: Goal: Risk for impaired skin integrity will decrease Outcome: Progressing   Problem: Tissue Perfusion: Goal: Adequacy of tissue perfusion will improve Outcome: Progressing   Problem: Education: Goal: Knowledge of General Education information will improve Description: Including pain rating scale, medication(s)/side effects and non-pharmacologic comfort measures Outcome: Progressing   Problem: Clinical Measurements: Goal: Ability to maintain clinical measurements within normal limits will improve Outcome: Progressing   Problem: Elimination: Goal: Will not experience complications related to bowel motility Outcome: Progressing Goal: Will not experience complications related to urinary retention Outcome: Progressing   Problem: Pain Managment: Goal: General experience of comfort will improve and/or be controlled Outcome: Progressing   Problem: Safety: Goal: Ability to remain free from injury will improve Outcome: Progressing

## 2024-05-30 NOTE — Progress Notes (Signed)
 Daily Progress Note Intern Pager: [phone]  Patient name: Shelby Brown Medical record number: [id number] Date of birth: 1980/09/22 Age: 43 y.o. Gender: female  Primary Care Provider: Adele Song, MD Consultants: None Code Status: FULL  Pt Overview and Major Events to Date:  9/10 - Admitted to FMTS  Shelby Brown is a 43 year old female presenting with hypotension and lower back/abdominal pain found to have UTI and AKI. 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 Assessment & Plan Acute kidney injury (HCC) UTI (urinary tract infection) Cr initially elevated d/t prerenal cause, but has normalized to 0.92 today. UA suggestive of UTI, continuing to await UCX. Patient afebrile, but reporting ongoing suprapubic pain, CVA tenderness, and generalized abdominal pain. --Increased Rocephin  to 2g, detransition pending urine culture results --AM BMP -Tylenol , Oxycodone  PRN -Consider methenamine while attempting to gain better control of patient's T2DM --Consider pelvic exam given sx of vaginal dryness, recurrent UTIs T2DM (type 2 diabetes mellitus) (HCC) A1C yesterday was 8.8. Patient transitioned to sensitive SSI. Most recent BG in 150s. Will restart home long-acting at lower dose due to recent drops in blood sugar. --Continue sensitive SSI --Insulin  Aspart - Restart glargine 5 units daily Normocytic anemia Hemoglobin 10.2 on admission and now 8.3 Appears a longstanding problem, unlikely acute bleeding. Retic count decreased suggesting low RBC production, possibly related to poor nutritional intake. -AM CBC --Ferrous sulfate  325 mg daily Chest pain Right upper quadrant abdominal pain Patient reporting generalized abdominal pain, worsened post-prandially. ACS workup unremarkable.  -Continue Pepcid  20 mg daily --RUQ ultrasound Diabetic polyneuropathy (HCC) Characteristic symptoms on exam with uncontrolled diabetes. -Continue gabapentin  100 mg 3  times daily Hypoalbuminemia As noted on CMP.  Patient also with BMI of 19.  Likely in setting of uncontrolled diabetes and relatively poor nutrition. -Nutrition consulted   FEN/GI: Carb modified diet as tolerated  PPx: SCDs given early ambulation  Dispo:Home pending clinical improvement . Barriers include ongoing medical management.   Subjective:  Patient reports ongoing generalized abdominal pain with eating, suprapubic pain and lower back pain. States she would like something else to help with pain. Reports ongoing increased urinary frequency and pain with urination. Informed patient we are waiting for urine cultures to come back, but are treating empirically for now. Patient reports she was told she had a murmur yesterday. Reports she first began having lightheadedness 1 year ago and has intermittent chest pain for the last 4 months. Does not know if anything has made her symptoms worse or better.  Objective: Temp:  [97.8 F (36.6 C)-99.5 F (37.5 C)] 98.5 F (36.9 C) (09/12 0430) Pulse Rate:  [85-101] 86 (09/12 0430) Resp:  [16-18] 18 (09/12 0430) BP: (67-160)/(47-93) 145/81 (09/12 0430) SpO2:  [98 %-100 %] 99 % (09/12 0430) Weight:  [48.2 kg-52 kg] 52 kg (09/12 0040)  Physical Exam: General: Well-appearing female,  Cardiovascular: regular rate and rhythm; Normal S1/S2.  Respiratory: clear to auscultation bilaterally Abdomen: nondistended, soft; tender to palpation in epigastrium and suprapubic area Genitourinary: bilateral CVA tenderness Extremities: non-edematous. Normal ROM  Laboratory: Most recent CBC Lab Results  Component Value Date   WBC 9.8 05/30/2024   HGB 8.3 (L) 05/30/2024   HCT 25.5 (L) 05/30/2024   MCV 81.5 05/30/2024   PLT 495 (H) 05/30/2024   Most recent BMP    Latest Ref Rng & Units 05/30/2024    5:36 AM  BMP  Glucose 70 - 99 mg/dL 807   BUN 6 - 20 mg/dL 21  Creatinine 0.44 - 1.00 mg/dL 9.07   Sodium 864 - 854 mmol/L 134   Potassium 3.5 - 5.1  mmol/L 4.5   Chloride 98 - 111 mmol/L 101   CO2 22 - 32 mmol/L 23   Calcium 8.9 - 10.3 mg/dL 8.5     Other pertinent labs: Troponin 3, 6; retic count 12.4, A1C 8.8   Imaging/Diagnostic Tests: No new imaging  Shelby Brown SAILOR, MD 05/30/2024, 7:58 AM  PGY-1, [facility] FPTS Intern pager: [phone], text pages welcome Secure chat group [facility] Teaching Service

## 2024-05-30 NOTE — Assessment & Plan Note (Addendum)
 Patient reporting generalized abdominal pain, worsened post-prandially. ACS workup unremarkable.  -Continue Pepcid  20 mg daily --RUQ ultrasound

## 2024-05-31 ENCOUNTER — Other Ambulatory Visit (HOSPITAL_COMMUNITY): Payer: Self-pay

## 2024-05-31 DIAGNOSIS — N10 Acute tubulo-interstitial nephritis: Secondary | ICD-10-CM

## 2024-05-31 LAB — BASIC METABOLIC PANEL WITH GFR
Anion gap: 8 (ref 5–15)
BUN: 13 mg/dL (ref 6–20)
CO2: 25 mmol/L (ref 22–32)
Calcium: 8.7 mg/dL — ABNORMAL LOW (ref 8.9–10.3)
Chloride: 101 mmol/L (ref 98–111)
Creatinine, Ser: 0.96 mg/dL (ref 0.44–1.00)
GFR, Estimated: >60 mL/min (ref >60)
Glucose, Bld: 207 mg/dL — ABNORMAL HIGH (ref 70–99)
Potassium: 4.5 mmol/L (ref 3.5–5.1)
Sodium: 134 mmol/L — ABNORMAL LOW (ref 135–145)

## 2024-05-31 LAB — CBC
HCT: 26.8 % — ABNORMAL LOW (ref 36.0–46.0)
Hemoglobin: 8.8 g/dL — ABNORMAL LOW (ref 12.0–15.0)
MCH: 27 pg (ref 26.0–34.0)
MCHC: 32.8 g/dL (ref 30.0–36.0)
MCV: 82.2 fL (ref 80.0–100.0)
Platelets: 507 K/uL — ABNORMAL HIGH (ref 150–400)
RBC: 3.26 MIL/uL — ABNORMAL LOW (ref 3.87–5.11)
RDW: 13.9 % (ref 11.5–15.5)
WBC: 8.4 K/uL (ref 4.0–10.5)
nRBC: 0 % (ref 0.0–0.2)

## 2024-05-31 LAB — URINE CULTURE: Culture: >=100000 — AB

## 2024-05-31 LAB — GLUCOSE, CAPILLARY
Glucose-Capillary: 200 mg/dL — ABNORMAL HIGH (ref 70–99)
Glucose-Capillary: 278 mg/dL — ABNORMAL HIGH (ref 70–99)

## 2024-05-31 MED ORDER — FERROUS SULFATE 325 (65 FE) MG PO TABS
325.0000 mg | ORAL_TABLET | ORAL | 0 refills | Status: AC
  Filled 2024-05-31: qty 45, 90d supply, fill #0

## 2024-05-31 MED ORDER — INSULIN GLARGINE 100 UNIT/ML ~~LOC~~ SOLN
7.0000 [IU] | Freq: Every day | SUBCUTANEOUS | Status: DC
  Administered 2024-05-31: 7 [IU] via SUBCUTANEOUS
  Filled 2024-05-31: qty 0.07

## 2024-05-31 MED ORDER — BASAGLAR KWIKPEN 100 UNIT/ML ~~LOC~~ SOPN: 15.0000 [IU] | PEN_INJECTOR | Freq: Every day | SUBCUTANEOUS | Status: DC

## 2024-05-31 MED ORDER — GABAPENTIN 100 MG PO CAPS
100.0000 mg | ORAL_CAPSULE | Freq: Three times a day (TID) | ORAL | 0 refills | Status: DC
  Filled 2024-05-31: qty 90, 30d supply, fill #0

## 2024-05-31 MED ORDER — CEFADROXIL 500 MG PO CAPS
500.0000 mg | ORAL_CAPSULE | Freq: Two times a day (BID) | ORAL | 0 refills | Status: AC
Start: 2024-05-31 — End: 2024-06-04
  Filled 2024-05-31: qty 8, 4d supply, fill #0

## 2024-05-31 MED ORDER — ESTROGENS, CONJUGATED 0.625 MG/GM VA CREA
0.5000 g | TOPICAL_CREAM | VAGINAL | 1 refills | Status: DC
Start: 2024-06-02 — End: 2024-06-19

## 2024-05-31 MED ORDER — SODIUM CHLORIDE 0.9 % IV SOLN
2.0000 g | INTRAVENOUS | Status: AC
  Administered 2024-05-31: 2 g via INTRAVENOUS
  Filled 2024-05-31: qty 20

## 2024-05-31 NOTE — Discharge Summary (Addendum)
 Family Medicine Teaching [facility] Discharge Summary  Patient name: Shelby Brown Medical record number: [id number] Date of birth: [birth year] Age: 43 y.o. Gender: female Date of Admission: 05/29/2024  Date of Discharge: 05/31/24  Admitting Physician: Elna Redo, MD  Primary Care Provider: Adele Song, MD Consultants: None  Indication for Hospitalization: UTI  Discharge Diagnoses/Problem List:  Principal Problem for Admission: UTI Other Problems addressed during stay:  Active Problems:   UTI (urinary tract infection)   Normocytic anemia   Diabetic polyneuropathy (HCC)   Hypoalbuminemia   T2DM (type 2 diabetes mellitus) (HCC)   Malnutrition of moderate degree  Brief Hospital Course:  Shelby Brown is a 43 y.o.female with a history of poorly-controlled T2DM and recurrent UTI's who was admitted to the [facility] Medicine Teaching Service at [facility] for UTI and AKI. Her hospital course is detailed below:  Recurrent UTI  Pyelonephritis Patient presented with suprapubic tenderness, nausea, bilateral flank pain, dry vaginal mucosa, and reduced PO intake secondary to pain. Urinalysis was suggestive of recurrent UTI with culture growing gram-negative rods. Patient was started on Cefadroxil  (9/11-9/13) and transitioned to Cefadroxil  BID at discharge to complete course on 9/17.  AKI On admission, Cr was found to be 1.20, up from baseline of 0.8. Cr returned to baseline with fluid resuscitation.  T2DM Was not taking long-acting insulin  for 4 days due to sick symptoms, hyperglycemic to 200-300's in ED.  Gradually increased home insulin  regimen with improving p.o. intake to 7 units daily. Discharged on 15u of insulin  with history of food insecurity.  Normocytic Anemia  Malnutrition of moderate degree Hemoglobin 10.2 on admission with long-standing anemia and decreased reticulocyte count, most likely secondary to poor nutritional intake and menses. Had low ferritin 6 months  ago. Nutrition was consulted. Patient started on daily ferrous sulfate  supplementation, nutritional shakes, and received education on diabetes nutrition.  RUQ Pain Patient reported generalized post-prandial abdominal pain. RUQ ultrasound was unremarkable. Patient started on antiacid for GERD.  Other chronic conditions were medically managed with home medications and formulary alternatives as necessary  PCP Follow-up Recommendations: Started vaginal estrogen, can consider starting methenamine Blood sugars were fluctuant at admission, ensure no hypoglycemia. Increase glargine back to 24 units as able/sugars dictate Ensure completion of antibiotic course. Needs a statin with T2DM.  Recommend consideration of colonoscopy for anemia (iron deficient) Repeat CBC and BMP at follow up Ask about food insecurity at follow, recommend TOC consult     Results/Tests Pending at Time of Discharge:  Unresulted Labs (From admission, onward)    None      Disposition: Home  Discharge Condition: Stable  Discharge Exam:  Vitals:   05/31/24 1207 05/31/24 1301  BP: (!) 156/95 (!) 146/80  Pulse: 91 82  Resp: 18   Temp: 98.5 F (36.9 C)   SpO2: 100% 100%   Exam per Dr. Lorrane General: Well-appearing, resting comfortably Cardiovascular: Regular rate rhythm, early systolic murmur loudest in aortic area, no rubs or gallops Respiratory: CTA bilaterally no crackles or wheezes Abdomen: Mild abdominal tenderness, active bowel sounds Extremities: No edema bilaterally  Significant Procedures: None  Significant Labs and Imaging:  Recent Labs  Lab 05/30/24 0536 05/31/24 0409  WBC 9.8 8.4  HGB 8.3* 8.8*  HCT 25.5* 26.8*  PLT 495* 507*   Recent Labs  Lab 05/30/24 0536 05/31/24 0409  NA 134* 134*  K 4.5 4.5  CL 101 101  CO2 23 25  GLUCOSE 192* 207*  BUN 21* 13  CREATININE 0.92 0.96  CALCIUM 8.5*  8.7*    CT Abdomen Pelvis w/ contrast 1. Nonspecific hypoattenuation involving the  bilateral kidneys may represent pyelonephritis in the appropriate clinical setting. Recommend correlation with urinalysis. Query possible cystitis.  RUQ US  1. Minimal debris/sludge in the gallbladder. No gallstones or evidence of cholecystitis. 2. Otherwise unremarkable right upper quadrant ultrasound.  Discharge Medications:  Allergies as of 05/31/2024       Reactions   Metformin  And Related Nausea Only   Poor PO intake and treated with prescription antiemetic agents        Medication List     PAUSE taking these medications    citalopram  20 MG tablet Wait to take this until your doctor or other care provider tells you to start again. Commonly known as: CELEXA  Tome 1 tableta (20 mg en total) por va oral diariamente. (Take 1 tablet (20 mg total) by mouth daily.)       TAKE these medications    acetaminophen  500 MG tablet Commonly known as: TYLENOL  Take 500 mg by mouth every 6 (six) hours as needed for mild pain (pain score 1-3) or moderate pain (pain score 4-6).   Basaglar  KwikPen 100 UNIT/ML Inject 15 Units into the skin daily. What changed: how much to take   cefadroxil  500 MG capsule Commonly known as: DURICEF Tomar 1 cpsula (500 mg en total) por va oral 2 (dos) veces al da durante 4 [address] (Take 1 capsule (500 mg total) by mouth 2 (two) times daily for 4 days.)   conjugated estrogens  0.625 MG/GM vaginal cream Commonly known as: PREMARIN  Place 0.25 Applicatorfuls vaginally 2 (two) times a week. Start taking on: June 02, 2024   famotidine  20 MG tablet Commonly known as: PEPCID  Tome 1 tableta (20 mg en total) por va oral diariamente. (Take 1 tablet (20 mg total) by mouth daily.)   FeroSul 325 (65 Fe) MG tablet Generic drug: ferrous sulfate  Tome 1 tableta (325 mg en total) por va oral [employer]. (Take 1 tablet (325 mg total) by mouth every other day.) Start taking on: June 01, 2024   gabapentin  100 MG capsule Commonly known as:  NEURONTIN  Tomar 1 cpsula (100 mg en total) por va oral 3 (tres) veces al [employer]. (Take 1 capsule (100 mg total) by mouth 3 (three) times daily.)   Insulin  Pen Needle 31G X 8 MM Misc Use one pen needle twice daily.   ondansetron  8 MG disintegrating tablet Commonly known as: ZOFRAN -ODT Take 1 tablet (8 mg total) by mouth every 8 (eight) hours as needed for nausea or vomiting.        Discharge Instructions: Please refer to Patient Instructions section of EMR for full details.  Patient was counseled important signs and symptoms that should prompt return to medical care, changes in medications, dietary instructions, activity restrictions, and follow up appointments.   Follow-Up Appointments:  Follow-up Information     Adele Song, MD Follow up on 06/04/2024.   Specialty: Family Medicine Why: at Contact information: [address] [place] [address] [phone]                Janna Ferrier, DO 05/31/2024, 1:27 PM PGY-2, [place] [facility]

## 2024-05-31 NOTE — Progress Notes (Signed)
 [facility] Spanish Interpreter Graciela at bedside. Extensive teaching of discharge instruction given to patient and wife.  Nutrition pamphet, Community resources provided in AVS.    Blood glucose at noon coverage given before lunch meal by nursing student.  VSS.  Patient c/o nausea and vomiting.  Jon RN notified.     Patient and spouse state that they have 2 adult children that speak and read English and they will review discharge paperwork with them.  Discharge nurse offered to call children so they can participate in the education.  Pt states that the the children are working.    MD currently at bedside with interpreter regarding plan of care and test results.     Pt and spouse verbally understands discharge POC.  PIV to be removed by nursing student and her instructor.    Patient will eat lunch and transfer to discharge lounge/  TOC meds at bedside.

## 2024-05-31 NOTE — Discharge Instructions (Addendum)
 Estimada Shelby Brown:  Shelby Brown por permitirnos participar en su atencin! En esta seccin, encontrar un breve resumen de su ingreso hospitalario, explicando el motivo de su ingreso, lo ocurrido durante el mismo, su(s) diagnstico(s) y Dance movement psychotherapist seguimiento recomendado.  Ingres por una infeccin renal y recibi antibiticos intravenosos. Por favor, complete la dosis de antibiticos orales que recibi al ser dado de alta hasta que no queden ms. Su dosis ser [facility] al da durante los prximos 4 das.  Disminuimos su dosis de insulina a 15 unidades diarias; esta dosis se puede ajustar con la ayuda de su mdico de cabecera.  INSTRUCCIONES DE CUIDADO Y POST-HOSPITALIZACIN 1. Recomendamos hacer un seguimiento con su mdico de cabecera dentro de una semana despus de recibir el alta. 2. Por favor, informe a su mdico de cabecera/especialistas sobre cualquier cambio en sus medicamentos, que podr consultar en la seccin de medicamentos de este paquete.  CITAS MDICAS Y SEGUIMIENTO Futuras citas Shelby Brown Proveedor [place] 17/05/2024 8:30 a. emerson Shelby Song, MD [facility]  Gracias por elegir [facility]! [employer] y [employer] vaya bien!  Equipo de Hospitalizacin del [place] [facility] [address] [place], [place] [address] [phone]  Dear Shelby Brown,   Thank you for letting us  participate in your care! In this section, you will find a brief hospital admission summary of why you were admitted to the hospital, what happened during your admission, your diagnosis/diagnoses, and recommended follow up.   You were admitted for a kidney infection and received IV antibiotics.  Please complete your oral antibiotics you received upon discharge until there are none left.  It will be the Cefadroxil  twice per day for the next 4 days.  We decreased your insulin  to 15 units daily, this  can be adjusted with your PCP's help.  POST-HOSPITAL & CARE INSTRUCTIONS We recommend following up with your PCP within 1 week from being discharged from the hospital. Please let PCP/Specialists know of any changes in medications that were made which you will be able to see in the medications section of this packet.  DOCTOR'S APPOINTMENTS & FOLLOW UP Future Appointments  Date Time Provider Department Center  06/04/2024  8:30 AM Shelby Song, MD [facility]     Thank you for choosing [facility]! Take care and be well!  Family Medicine Teaching Service Inpatient Team   [facility]  [address] [place], [place] [address] [phone]    Shelby Brown MINISTRY Address: [address] [place], [place] [address] Phone Number: [phone] Hours of Operation: Residents of [place] can come to obtain food Monday through Friday from 8:30am until 3:30pm. Photo ID and Social Security cards required for all residents of a household. Can come six times a year  [facility] Address: [address] [place], [place] [address] Phone Number: [phone] Hours of Operation: Operates Tuesday-Friday 10:00 a.m. to 1 p.m. Requirements: Referral from DSS needed. May come 6 times a year, 30 days apart. Photo ID and SS required for all residents of household.  [facility] Address: [address] [place], [place] [address] Phone Number: [phone] Hours of Operation: Food pantry is open on the last Saturday of each month from 10:00 am - 12:00 noon. No appointment needed. No qualifications.  [facility] Address: [address] [place], [place] [address] Phone Number: [phone]. [phone] Hours of Operation: Must make reservations to  pick up food on Saturdays. Sign ups for Saturday pick up beginning at 8:30 a.m. on Monday morning.  [facility] Address: [address]  [place], [place] [address] Phone Number: [phone] Hours of Operation: If you need food, bring proper identification such as a driver's license to receive a bag of food once a month. Requirements: Can come once every 30 days with referral DSS, Holiday representative, Mental health etc. Each referral good for six visits. Photo ID required. *1st visit no referral required.  [facility] Address: [address] [place], [place] [address] Phone Number: [phone]  [facility] Address: [address] [place], [place] [address] Phone Number: [phone] Hours of Operation:  You can register at [email or web address] for free groceries  [facility] Address: [address] [place], [place] [address] Phone Number: [phone] Hours of Operation: Drive through giveaway, first come first served. Every 3rd Saturday 11AM - 1PM  [facility] [address] Address: [address] [place] [address] Phone Number: [phone]   [place]  [employer] [facility] [employer] [facility] Address: [address] [place], [place] [address] Phone Number: [phone] Hours of Operation: Once a month every 3rd Saturday  [facility] Address: [address] [place], [place] [address] Phone Number: [phone] Hours of Operation: Distribution happens from 9:00-10:00 a.m. every Saturday.     [facility] Address: [address] [place], [place] [address] Phone Number: [phone] Hours of Operation: ONCE a week for the community food distribution held every Tuesday, Wednesday and Thursday from 11 a.m. - 2:00 p.m. Food is available on a first come, first serve basis and varies week to week. No appointment necessary for drive thru pick up.  [facility] Address: [address] [place], [place] [address] Phone Number: [phone] Hours of Operation: Open every 3rd Thursday 9:30 a.m. - 11:00 a.m.  [facility] Address: [address] [place], [place] [address] Phone Number: [phone] Hours of Operation: Please call for hours, directions, and questions  [facility] [place] [facility] Address: [address] [place], [place]  [address] Phone Number: [phone] Website: https://www.Hollyguns.co.za Food Finder app: https://findfood.ghpfa.org  [employer] Address: [address] [place], [place] [address] Phone Number: [phone] Hours of Operation: Contact Bree Harpe. Enrolled Substance Abuse Clients Only  [facility] Address: [address] [place], [address]  Phone Number: [phone] Hours of Operation: Contact Bartley Irving. Food pantry open the 3rd Saturday of each month from 9 a.m. -12 p.m. only  [place] [facility] Address: [address] [place], [place] [address] Phone Number: [phone] Hours of Operation: Contact Geni Lee. Emergency food bank open on Saturdays by appointment only  [facility] Address: [address] [place], [place] [address] Phone Number: [phone] Hours of Operation: No specific contact person; Anyone can help  [facility] Address: [address] [place], [place] [address] Phone Number: [phone] Hours of Operation: Contact Medford Molt. Agency gives out a bag of food every Thursday from 2-4 p.m. only, and also provides a community meal every Thursday between 5-6 p.m. Other services provided include rent/mortgage and utility assistance, women's winter shelter, thrift store, and senior adult activities.  [facility] [place] Address: [address] [place], [place] [address] Phone Number: [phone] Hours of Operation: The Emergency Food Assistance Program provides individuals and families with a generous supply of food  including meat, fresh vegetables, and nonperishable items. The food box contains five days' worth of food, and each family or individual can receive a box once per  month. M, W, Th, Fr 11am-2pm, walk-ins welcome.  [facility] Address: [address] [place], [place] [address]  Phone Number: [phone] Hours of Operation: Contact Asia Cathlean. Tuesdays and Thursdays from 11am - 3pm by appointment only

## 2024-06-02 ENCOUNTER — Other Ambulatory Visit: Payer: Self-pay

## 2024-06-03 ENCOUNTER — Other Ambulatory Visit (HOSPITAL_COMMUNITY): Payer: Self-pay

## 2024-06-04 ENCOUNTER — Telehealth: Payer: Self-pay | Admitting: Family Medicine

## 2024-06-04 ENCOUNTER — Other Ambulatory Visit (HOSPITAL_COMMUNITY): Payer: Self-pay

## 2024-06-04 ENCOUNTER — Other Ambulatory Visit: Payer: Self-pay

## 2024-06-04 ENCOUNTER — Ambulatory Visit: Payer: Self-pay | Admitting: Family Medicine

## 2024-06-04 MED ORDER — ESTROGENS CONJUGATED 0.625 MG/GM VA CREA
TOPICAL_CREAM | VAGINAL | 1 refills | Status: DC
  Filled 2024-06-04 (×2): qty 30, 30d supply, fill #0

## 2024-06-04 NOTE — Telephone Encounter (Signed)
 Called patient with phone Spanish interpreter to follow-up about missed appointment today.  Patient's voicemail box was full, tried her husband's phone number and his voicemail box was also not accepting new messages.  Called patient's daughter [name] and spoke with her about missed appointment.  Rescheduled patient for hospital follow-up visit next Tuesday afternoon.

## 2024-06-04 NOTE — Progress Notes (Deleted)
    SUBJECTIVE:   CHIEF COMPLAINT / HPI:   Hospital follow up Started vaginal estrogen, can consider starting methenamine Blood sugars were fluctuant at admission, ensure no hypoglycemia. Increase glargine back to 24 units as able/sugars dictate Ensure completion of antibiotic course. Needs a statin with T2DM.  Recommend consideration of colonoscopy for anemia (iron deficient) Repeat CBC and BMP at follow up Ask about food insecurity at follow, recommend TOC consult    PERTINENT  PMH / PSH: ***  OBJECTIVE:   LMP  (LMP Unknown)   ***  ASSESSMENT/PLAN:   Assessment & Plan      Rea Raring, MD [facility]

## 2024-06-06 ENCOUNTER — Other Ambulatory Visit: Payer: Self-pay | Admitting: Family Medicine

## 2024-06-06 ENCOUNTER — Other Ambulatory Visit: Payer: Self-pay

## 2024-06-09 ENCOUNTER — Other Ambulatory Visit: Payer: Self-pay

## 2024-06-10 ENCOUNTER — Ambulatory Visit (INDEPENDENT_AMBULATORY_CARE_PROVIDER_SITE_OTHER): Payer: Self-pay | Admitting: Student

## 2024-06-10 VITALS — BP 104/64 | HR 87 | Ht 62.0 in | Wt 113.6 lb

## 2024-06-10 DIAGNOSIS — E1165 Type 2 diabetes mellitus with hyperglycemia: Secondary | ICD-10-CM

## 2024-06-10 DIAGNOSIS — D649 Anemia, unspecified: Secondary | ICD-10-CM

## 2024-06-10 MED ORDER — BASAGLAR KWIKPEN 100 UNIT/ML ~~LOC~~ SOPN: 24.0000 [IU] | PEN_INJECTOR | Freq: Every day | SUBCUTANEOUS | Status: DC

## 2024-06-10 NOTE — Progress Notes (Signed)
    SUBJECTIVE:   CHIEF COMPLAINT / HPI:   43 year old female with history of T2DM Presents today for hospital follow-up At her recent hospital visit was treated for UTI, AKI, hyperglycemia and hypotension Today patient says she is doing much better and UTI symptoms have since resolved For UTI she was treated with cefuroxime  in hospital, sent home on cefadroxil  to be completed 9/17 She does not check her home blood pressure.  Home blood sugar has been elevated between 300-400 Denies any nausea or vomiting but endorses intermittent headaches Has been out of insulin  for the past 3-4 days.   Unable to pick up her insulin  at the pharmacy d/t no refills and daughter reported insulin  was discontinued during hospitalization due to some hypoglycemic readings. She endorses some polyuria and polydipsia for the past few days.  PERTINENT  PMH / PSH: Reviewed   OBJECTIVE:   BP 104/64   Pulse 87   Ht 5' 2 (1.575 m)   Wt 113 lb 9.6 oz (51.5 kg)   LMP  (LMP Unknown)   SpO2 100%   BMI 20.78 kg/m    Physical Exam General: Alert, well appearing, NAD Cardiovascular: RRR, No Murmurs, Normal S2/S2 Respiratory: CTAB, No wheezing or Rales Abdomen: No distension or tenderness Extremities: No edema on extremities     ASSESSMENT/PLAN:   Type 2 diabetes mellitus with hyperglycemia (HCC) Poorly controlled diabetes with symptoms of presentation today.  Suspect this is likely due to patient being out of her insulin  for the past 3- 4 days.  On exam patient is alert and not having any nausea or vomiting at this time.  Per chart review, saw Dr. Koval recently and plan prior to recent hospitalization was to increase insulin  to 24 units daily,  - Refilled patient's insulin  to 24 units daily, can reduce to 18 units if she has hypoglycemic episodes - Hypoglycemia precaution provided to patient - Ordered labs including lipid panel, CBC and CMP  UTI (urinary tract infection) Currently asymptomatic although  patient was not taking her medication as prescribed due to mixup on how she is supposed to take the medication.  Advised patient to complete her current dose, cefadroxil  1 tablet 2 times daily.  Has 2 days worth of medication left.  Normocytic anemia CBC ordered, currently on iron supplements.  Patient to continue iron supplements.   Hypotension BP today was borderline.  Currently asymptomatic.  Patient advised on adequate hydration and diet blood glucose control given she would be at risk of dehydration due to hyperglycemia   Norleen April, MD [facility]

## 2024-06-10 NOTE — Assessment & Plan Note (Addendum)
 Poorly controlled diabetes with symptoms of presentation today.  Suspect this is likely due to patient being out of her insulin  for the past 3- 4 days.  On exam patient is alert and not having any nausea or vomiting at this time.  Per chart review, saw Dr. Koval recently and plan prior to recent hospitalization was to increase insulin  to 24 units daily,  - Refilled patient's insulin  to 24 units daily, can reduce to 18 units if she has hypoglycemic episodes - Hypoglycemia precaution provided to patient - Ordered labs including lipid panel, CBC and CMP

## 2024-06-10 NOTE — Patient Instructions (Addendum)
 Un placer conocerle hoy.  Le he recargado la insulina y le recomiendo tomar 24 unidades diarias.  Por favor, revise su nivel de azcar en sangre a diario y lleve un registro diario. Esto se revisar cuando regrese en 7 a 10 das.  Si comienza a tener nuseas, vmitos y no puede retener nada, acuda a urgencias para una nueva evaluacin.  Tambin le solicitaron anlisis hoy para incluir hemograma, electrolitos, funcin renal y para controlar sus niveles de colesterol.  Pleasure to meet you today.  I have refilled your insulin  and I recommend taking 24 units daily.  Please check your blood sugars daily and keep a daily blood blood sugar logs.  This will be reviewed when you come back in 7-10 days.  If you start having nausea, vomiting and unable to keep anything down please go to the ED to be reevaluated.  Also ordered labs today to include your blood counts, electrolytes, kidney function, and to check your cholesterol levels.

## 2024-06-10 NOTE — Assessment & Plan Note (Signed)
 Currently asymptomatic although patient was not taking her medication as prescribed due to mixup on how she is supposed to take the medication.  Advised patient to complete her current dose, cefadroxil  1 tablet 2 times daily.  Has 2 days worth of medication left.

## 2024-06-10 NOTE — Assessment & Plan Note (Signed)
 CBC ordered, currently on iron supplements.  Patient to continue iron supplements.

## 2024-06-11 LAB — CBC
Hematocrit: 27.2 % — ABNORMAL LOW (ref 34.0–46.6)
Hemoglobin: 8.5 g/dL — ABNORMAL LOW (ref 11.1–15.9)
MCH: 27.1 pg (ref 26.6–33.0)
MCHC: 31.3 g/dL — ABNORMAL LOW (ref 31.5–35.7)
MCV: 87 fL (ref 79–97)
Platelets: 521 x10E3/uL — ABNORMAL HIGH (ref 150–450)
RBC: 3.14 x10E6/uL — ABNORMAL LOW (ref 3.77–5.28)
RDW: 13.3 % (ref 11.7–15.4)
WBC: 7.7 x10E3/uL (ref 3.4–10.8)

## 2024-06-11 LAB — COMPREHENSIVE METABOLIC PANEL WITH GFR
ALT: 9 IU/L (ref 0–32)
AST: 10 IU/L (ref 0–40)
Albumin: 3.7 g/dL — ABNORMAL LOW (ref 3.9–4.9)
Alkaline Phosphatase: 74 IU/L (ref 41–116)
BUN/Creatinine Ratio: 21 (ref 9–23)
BUN: 22 mg/dL (ref 6–24)
Bilirubin Total: 0.2 mg/dL (ref 0.0–1.2)
CO2: 21 mmol/L (ref 20–29)
Calcium: 9.2 mg/dL (ref 8.7–10.2)
Chloride: 97 mmol/L (ref 96–106)
Creatinine, Ser: 1.05 mg/dL — ABNORMAL HIGH (ref 0.57–1.00)
Globulin, Total: 3.1 g/dL (ref 1.5–4.5)
Glucose: 352 mg/dL — ABNORMAL HIGH (ref 70–99)
Potassium: 5 mmol/L (ref 3.5–5.2)
Sodium: 132 mmol/L — ABNORMAL LOW (ref 134–144)
Total Protein: 6.8 g/dL (ref 6.0–8.5)
eGFR: 67 mL/min/1.73 (ref >59)

## 2024-06-11 LAB — LIPID PANEL
Chol/HDL Ratio: 2.3 ratio (ref 0.0–4.4)
Cholesterol, Total: 149 mg/dL (ref 100–199)
HDL: 64 mg/dL (ref >39)
LDL Chol Calc (NIH): 62 mg/dL (ref 0–99)
Triglycerides: 132 mg/dL (ref 0–149)
VLDL Cholesterol Cal: 23 mg/dL (ref 5–40)

## 2024-06-18 ENCOUNTER — Other Ambulatory Visit: Payer: Self-pay

## 2024-06-19 ENCOUNTER — Ambulatory Visit (INDEPENDENT_AMBULATORY_CARE_PROVIDER_SITE_OTHER): Payer: Self-pay | Admitting: Family Medicine

## 2024-06-19 ENCOUNTER — Observation Stay (HOSPITAL_COMMUNITY)
Admission: EM | Admit: 2024-06-19 | Discharge: 2024-06-20 | Disposition: A | Discharge status: Home / Self Care | Payer: Self-pay | Attending: Family Medicine | Admitting: Family Medicine

## 2024-06-19 ENCOUNTER — Emergency Department (HOSPITAL_COMMUNITY): Payer: Self-pay

## 2024-06-19 ENCOUNTER — Other Ambulatory Visit: Payer: Self-pay

## 2024-06-19 ENCOUNTER — Encounter (HOSPITAL_COMMUNITY): Payer: Self-pay

## 2024-06-19 ENCOUNTER — Encounter: Payer: Self-pay | Admitting: Family Medicine

## 2024-06-19 VITALS — BP 68/44 | HR 95 | Temp 97.6°F | Ht 62.0 in | Wt 105.6 lb

## 2024-06-19 DIAGNOSIS — N7689 Other specified inflammation of vagina and vulva: Secondary | ICD-10-CM | POA: Insufficient documentation

## 2024-06-19 DIAGNOSIS — E1165 Type 2 diabetes mellitus with hyperglycemia: Secondary | ICD-10-CM

## 2024-06-19 DIAGNOSIS — Z23 Encounter for immunization: Secondary | ICD-10-CM | POA: Insufficient documentation

## 2024-06-19 DIAGNOSIS — E119 Type 2 diabetes mellitus without complications: Secondary | ICD-10-CM | POA: Diagnosis present

## 2024-06-19 DIAGNOSIS — E118 Type 2 diabetes mellitus with unspecified complications: Secondary | ICD-10-CM | POA: Diagnosis present

## 2024-06-19 DIAGNOSIS — M25512 Pain in left shoulder: Secondary | ICD-10-CM | POA: Insufficient documentation

## 2024-06-19 DIAGNOSIS — I951 Orthostatic hypotension: Secondary | ICD-10-CM

## 2024-06-19 DIAGNOSIS — R079 Chest pain, unspecified: Secondary | ICD-10-CM | POA: Diagnosis present

## 2024-06-19 DIAGNOSIS — Z789 Other specified health status: Secondary | ICD-10-CM

## 2024-06-19 DIAGNOSIS — N898 Other specified noninflammatory disorders of vagina: Secondary | ICD-10-CM | POA: Insufficient documentation

## 2024-06-19 DIAGNOSIS — N12 Tubulo-interstitial nephritis, not specified as acute or chronic: Principal | ICD-10-CM | POA: Diagnosis present

## 2024-06-19 LAB — URINALYSIS, W/ REFLEX TO CULTURE (INFECTION SUSPECTED)
Bilirubin Urine: NEGATIVE
Glucose, UA: >=500 mg/dL — AB
Ketones, ur: NEGATIVE mg/dL
Nitrite: NEGATIVE
Protein, ur: 30 mg/dL — AB
Specific Gravity, Urine: 1.012 (ref 1.005–1.030)
pH: 5 (ref 5.0–8.0)

## 2024-06-19 LAB — CBC WITH DIFFERENTIAL/PLATELET
Abs Immature Granulocytes: 0.03 K/uL (ref 0.00–0.07)
Basophils Absolute: 0.1 K/uL (ref 0.0–0.1)
Basophils Relative: 1 %
Eosinophils Absolute: 0.3 K/uL (ref 0.0–0.5)
Eosinophils Relative: 3 %
HCT: 31.5 % — ABNORMAL LOW (ref 36.0–46.0)
Hemoglobin: 10.4 g/dL — ABNORMAL LOW (ref 12.0–15.0)
Immature Granulocytes: 0 %
Lymphocytes Relative: 20 %
Lymphs Abs: 1.7 K/uL (ref 0.7–4.0)
MCH: 26.9 pg (ref 26.0–34.0)
MCHC: 33 g/dL (ref 30.0–36.0)
MCV: 81.6 fL (ref 80.0–100.0)
Monocytes Absolute: 0.4 K/uL (ref 0.1–1.0)
Monocytes Relative: 5 %
Neutro Abs: 6.3 K/uL (ref 1.7–7.7)
Neutrophils Relative %: 71 %
Platelets: 410 K/uL — ABNORMAL HIGH (ref 150–400)
RBC: 3.86 MIL/uL — ABNORMAL LOW (ref 3.87–5.11)
RDW: 15 % (ref 11.5–15.5)
WBC: 8.9 K/uL (ref 4.0–10.5)
nRBC: 0 % (ref 0.0–0.2)

## 2024-06-19 LAB — COMPREHENSIVE METABOLIC PANEL WITH GFR
ALT: 12 U/L (ref 0–44)
AST: 15 U/L (ref 15–41)
Albumin: 3 g/dL — ABNORMAL LOW (ref 3.5–5.0)
Alkaline Phosphatase: 57 U/L (ref 38–126)
Anion gap: 10 (ref 5–15)
BUN: 19 mg/dL (ref 6–20)
CO2: 21 mmol/L — ABNORMAL LOW (ref 22–32)
Calcium: 8.9 mg/dL (ref 8.9–10.3)
Chloride: 100 mmol/L (ref 98–111)
Creatinine, Ser: 1.24 mg/dL — ABNORMAL HIGH (ref 0.44–1.00)
GFR, Estimated: 55 mL/min — ABNORMAL LOW (ref >60)
Glucose, Bld: 378 mg/dL — ABNORMAL HIGH (ref 70–99)
Potassium: 4.2 mmol/L (ref 3.5–5.1)
Sodium: 131 mmol/L — ABNORMAL LOW (ref 135–145)
Total Bilirubin: 0.3 mg/dL (ref 0.0–1.2)
Total Protein: 7.1 g/dL (ref 6.5–8.1)

## 2024-06-19 LAB — CBG MONITORING, ED: Glucose-Capillary: 297 mg/dL — ABNORMAL HIGH (ref 70–99)

## 2024-06-19 LAB — PROTIME-INR
INR: 1.1 (ref 0.8–1.2)
Prothrombin Time: 14.4 s (ref 11.4–15.2)

## 2024-06-19 LAB — I-STAT CG4 LACTIC ACID, ED: Lactic Acid, Venous: 1.5 mmol/L (ref 0.5–1.9)

## 2024-06-19 LAB — GLUCOSE, CAPILLARY: Glucose-Capillary: 140 mg/dL — ABNORMAL HIGH (ref 70–99)

## 2024-06-19 LAB — HCG, SERUM, QUALITATIVE: Preg, Serum: NEGATIVE

## 2024-06-19 MED ORDER — FLUCONAZOLE 150 MG PO TABS
150.0000 mg | ORAL_TABLET | Freq: Once | ORAL | Status: AC
  Administered 2024-06-19: 150 mg via ORAL
  Filled 2024-06-19: qty 1

## 2024-06-19 MED ORDER — LACTATED RINGERS IV BOLUS (SEPSIS)
1000.0000 mL | Freq: Once | INTRAVENOUS | Status: AC
  Administered 2024-06-19: 1000 mL via INTRAVENOUS

## 2024-06-19 MED ORDER — ONDANSETRON HCL 4 MG PO TABS: 4.0000 mg | ORAL_TABLET | Freq: Four times a day (QID) | ORAL | Status: DC | PRN

## 2024-06-19 MED ORDER — INSULIN ASPART 100 UNIT/ML IJ SOLN
0.0000 [IU] | Freq: Three times a day (TID) | INTRAMUSCULAR | Status: DC
  Administered 2024-06-20: 3 [IU] via SUBCUTANEOUS
  Administered 2024-06-20: 5 [IU] via SUBCUTANEOUS
  Administered 2024-06-20: 3 [IU] via SUBCUTANEOUS

## 2024-06-19 MED ORDER — IOHEXOL 350 MG/ML SOLN
75.0000 mL | Freq: Once | INTRAVENOUS | Status: AC | PRN
  Administered 2024-06-19: 75 mL via INTRAVENOUS

## 2024-06-19 MED ORDER — GABAPENTIN 100 MG PO CAPS
100.0000 mg | ORAL_CAPSULE | Freq: Three times a day (TID) | ORAL | Status: DC
Start: 2024-06-19 — End: 2024-06-20
  Administered 2024-06-19 – 2024-06-20 (×3): 100 mg via ORAL
  Filled 2024-06-19 (×3): qty 1

## 2024-06-19 MED ORDER — POLYETHYLENE GLYCOL 3350 17 G PO PACK: 17.0000 g | PACK | Freq: Every day | ORAL | Status: DC | PRN

## 2024-06-19 MED ORDER — LIDOCAINE 5 % EX PTCH
1.0000 | MEDICATED_PATCH | CUTANEOUS | Status: DC
  Administered 2024-06-19: 1 via TRANSDERMAL
  Filled 2024-06-19: qty 1

## 2024-06-19 MED ORDER — LACTATED RINGERS IV SOLN: INTRAVENOUS | Status: DC

## 2024-06-19 MED ORDER — ACETAMINOPHEN 500 MG PO TABS: 1000.0000 mg | ORAL_TABLET | Freq: Four times a day (QID) | ORAL | Status: DC | PRN

## 2024-06-19 MED ORDER — INSULIN GLARGINE 100 UNIT/ML ~~LOC~~ SOLN
15.0000 [IU] | Freq: Every day | SUBCUTANEOUS | Status: DC
  Administered 2024-06-19 – 2024-06-20 (×2): 15 [IU] via SUBCUTANEOUS
  Filled 2024-06-19 (×3): qty 0.15

## 2024-06-19 MED ORDER — ONDANSETRON HCL 4 MG/2ML IJ SOLN: 4.0000 mg | Freq: Four times a day (QID) | INTRAMUSCULAR | Status: DC | PRN

## 2024-06-19 MED ORDER — FERROUS SULFATE 325 (65 FE) MG PO TABS
325.0000 mg | ORAL_TABLET | ORAL | Status: DC
  Administered 2024-06-20: 325 mg via ORAL
  Filled 2024-06-19: qty 1

## 2024-06-19 MED ORDER — PNEUMOCOCCAL 20-VAL CONJ VACC 0.5 ML IM SUSY
0.5000 mL | PREFILLED_SYRINGE | INTRAMUSCULAR | Status: AC
  Administered 2024-06-20: 0.5 mL via INTRAMUSCULAR
  Filled 2024-06-19: qty 0.5

## 2024-06-19 MED ORDER — ACETAMINOPHEN 500 MG PO TABS
1000.0000 mg | ORAL_TABLET | Freq: Four times a day (QID) | ORAL | Status: DC | PRN
  Administered 2024-06-19 – 2024-06-20 (×2): 1000 mg via ORAL
  Filled 2024-06-19 (×2): qty 2

## 2024-06-19 MED ORDER — SODIUM CHLORIDE 0.9 % IV SOLN: 2.0000 g | INTRAVENOUS | Status: DC

## 2024-06-19 MED ORDER — CEFTRIAXONE SODIUM 1 G IJ SOLR
1.0000 g | Freq: Once | INTRAMUSCULAR | Status: AC
  Administered 2024-06-19: 1 g via INTRAVENOUS
  Filled 2024-06-19: qty 10

## 2024-06-19 MED ORDER — INSULIN ASPART 100 UNIT/ML IJ SOLN
5.0000 [IU] | Freq: Once | INTRAMUSCULAR | Status: AC
  Administered 2024-06-19: 5 [IU] via SUBCUTANEOUS

## 2024-06-19 MED ORDER — FAMOTIDINE 20 MG PO TABS
20.0000 mg | ORAL_TABLET | Freq: Every day | ORAL | Status: DC
  Administered 2024-06-20: 20 mg via ORAL
  Filled 2024-06-19: qty 1

## 2024-06-19 MED ORDER — ENOXAPARIN SODIUM 40 MG/0.4ML IJ SOSY
40.0000 mg | PREFILLED_SYRINGE | INTRAMUSCULAR | Status: DC
  Administered 2024-06-20: 40 mg via SUBCUTANEOUS
  Filled 2024-06-19: qty 0.4

## 2024-06-19 MED ORDER — INFLUENZA VIRUS VACC SPLIT PF (FLUZONE) 0.5 ML IM SUSY
0.5000 mL | PREFILLED_SYRINGE | INTRAMUSCULAR | Status: AC
  Administered 2024-06-20: 0.5 mL via INTRAMUSCULAR
  Filled 2024-06-19: qty 0.5

## 2024-06-19 NOTE — Progress Notes (Signed)
 Shelby Brown [phone]

## 2024-06-19 NOTE — ED Provider Notes (Signed)
 [place] EMERGENCY DEPARTMENT AT [facility] Provider Note   CSN: [id number] Arrival date & time: 06/19/24  1121     Patient presents with: Hypotension and Abdominal Pain   Shelby Brown is a 43 y.o. female.   43 year old female with past medical history of diabetes presenting to the emergency department today with lightheadedness.  The patient did fall earlier.  She did hit her head but did not lose consciousness.  Was very lightheaded before then.  The patient denies any fevers but has had some chills.  Also reports that she has been having some increased urinary frequency and urgency over the past few weeks as well as some flank pain.  Initially at her doctor's office her glucose was significantly elevated and her blood pressure was in the 60s systolic.  The patient denies any associated chest pain.  She has received some IV fluids and blood pressure has improved.        Prior to Admission medications   Medication Sig Start Date End Date Taking? Authorizing Provider  acetaminophen  (TYLENOL ) 500 MG tablet Take 500 mg by mouth every 6 (six) hours as needed for mild pain (pain score 1-3) or moderate pain (pain score 4-6).    [provider]  citalopram  (CELEXA ) 20 MG tablet Take 1 tablet (20 mg total) by mouth daily. Patient not taking: Reported on 04/10/2024 03/15/24   Theophilus Pagan, MD  conjugated estrogens  (PREMARIN ) 0.625 MG/GM vaginal cream Place 0.25 Applicatorfuls vaginally 2 (two) times a week. 06/02/24 06/02/25  Delores Suzann HERO, MD  conjugated estrogens  (PREMARIN ) vaginal cream Place a pea sized amount vaginally 2 (two) times a week. 06/02/24   Delores Suzann HERO, MD  famotidine  (PEPCID ) 20 MG tablet Take 1 tablet (20 mg total) by mouth daily. Patient not taking: No sig reported 03/15/24   Theophilus Pagan, MD  ferrous sulfate  325 (65 FE) MG tablet Take 1 tablet (325 mg total) by mouth every other day. 06/01/24   Delores Suzann HERO, MD  gabapentin   (NEURONTIN ) 100 MG capsule Take 1 capsule (100 mg total) by mouth 3 (three) times daily. 05/31/24   Delores Suzann HERO, MD  Insulin  Glargine (BASAGLAR  KWIKPEN) 100 UNIT/ML Inject 24 Units into the skin daily. Inject 24 Units into the skin daily. 06/10/24 08/09/24  Rosendo Rush, MD  Insulin  Pen Needle 31G X 8 MM MISC Use one pen needle twice daily. 05/29/24   McDiarmid, Krystal BIRCH, MD  ondansetron  (ZOFRAN -ODT) 8 MG disintegrating tablet Take 1 tablet (8 mg total) by mouth every 8 (eight) hours as needed for nausea or vomiting. Patient not taking: Reported on 04/10/2024 03/15/24   Theophilus Pagan, MD  promethazine  (PHENERGAN ) 6.25 MG/5ML syrup Take 5 mLs (6.25 mg total) by mouth 4 (four) times daily as needed for nausea or vomiting. Patient not taking: Reported on 11/16/2020 09/13/20 11/19/20  Lue Elsie BROCKS, MD    Allergies: Metformin  and related    Review of Systems  Endocrine: Positive for polyuria.  Genitourinary:  Positive for dysuria and flank pain.  Neurological:  Positive for light-headedness.  All other systems reviewed and are negative.   Updated Vital Signs BP (!) 115/94   Pulse 87   Temp 98 F (36.7 C) (Oral)   Resp 17   Ht 5' 2 (1.575 m)   Wt 47.9 kg   LMP  (LMP Unknown)   SpO2 100%   BMI 19.31 kg/m   Physical Exam Vitals and nursing note reviewed.   Gen: NAD Eyes: PERRL, EOMI  HEENT: no oropharyngeal swelling Neck: trachea midline Resp: clear to auscultation bilaterally Card: RRR, no murmurs, rubs, or gallops Abd: The patient tender over the right and left lower quadrants without guarding or rebound, there is also some periumbilical tenderness, no significant upper abdominal tenderness noted Extremities: no calf tenderness, no edema Vascular: 2+ radial pulses bilaterally, 2+ DP pulses bilaterally Neuro: No focal deficits Skin: no rashes Psyc: acting appropriately   (all labs ordered are listed, but only abnormal results are displayed) Labs Reviewed  CBC WITH  DIFFERENTIAL/PLATELET - Abnormal; Notable for the following components:      Result Value   RBC 3.86 (*)    Hemoglobin 10.4 (*)    HCT 31.5 (*)    Platelets 410 (*)    All other components within normal limits  URINALYSIS, W/ REFLEX TO CULTURE (INFECTION SUSPECTED) - Abnormal; Notable for the following components:   APPearance CLOUDY (*)    Glucose, UA >=500 (*)    Hgb urine dipstick SMALL (*)    Protein, ur 30 (*)    Leukocytes,Ua MODERATE (*)    Bacteria, UA MANY (*)    All other components within normal limits  COMPREHENSIVE METABOLIC PANEL WITH GFR - Abnormal; Notable for the following components:   Sodium 131 (*)    CO2 21 (*)    Glucose, Bld 378 (*)    Creatinine, Ser 1.24 (*)    Albumin 3.0 (*)    GFR, Estimated 55 (*)    All other components within normal limits  CBG MONITORING, ED - Abnormal; Notable for the following components:   Glucose-Capillary 297 (*)    All other components within normal limits  CULTURE, BLOOD (ROUTINE X 2)  CULTURE, BLOOD (ROUTINE X 2)  HCG, SERUM, QUALITATIVE  PROTIME-INR  I-STAT CG4 LACTIC ACID, ED    EKG: EKG Interpretation Date/Time:  Thursday June 19 2024 11:51:06 EDT Ventricular Rate:  90 PR Interval:  173 QRS Duration:  89 QT Interval:  350 QTC Calculation: 429 R Axis:   73  Text Interpretation: Sinus rhythm Low voltage, precordial leads Confirmed by Ruthe Cornet [phone]) on 06/19/2024 4:02:58 PM  Radiology: CT ABDOMEN PELVIS W CONTRAST Result Date: 06/19/2024 CLINICAL DATA:  Abdominal/flank pain, stone suspected EXAM: CT ABDOMEN AND PELVIS WITH CONTRAST TECHNIQUE: Multidetector CT imaging of the abdomen and pelvis was performed using the standard protocol following bolus administration of intravenous contrast. RADIATION DOSE REDUCTION: This exam was performed according to the departmental dose-optimization program which includes automated exposure control, adjustment of the mA and/or kV according to patient size and/or use of  iterative reconstruction technique. CONTRAST:  75mL OMNIPAQUE  IOHEXOL  350 MG/ML SOLN COMPARISON:  05/29/2024 FINDINGS: Lower chest: No focal airspace consolidation or pleural effusion.Posterior bibasilar dependent atelectasis. Hepatobiliary: No mass.Focal fatty infiltration along the falciform ligament.No radiopaque stones or wall thickening of the gallbladder. No intrahepatic or extrahepatic biliary ductal dilation. The portal veins are patent. Pancreas: No mass or main ductal dilation. No peripancreatic inflammation or fluid collection. Spleen: Normal size. No mass. Adrenals/Urinary Tract: No adrenal masses. No renal mass. Multifocal wedge-shaped regions of cortical hypoattenuation in both kidneys. Bilateral upper urothelial enhancement. No nephrolithiasis or hydronephrosis. Circumferential wall thickening of the urinary bladder. Stomach/Bowel: The stomach is decompressed without focal abnormality. No small bowel wall thickening or inflammation. No small bowel obstruction.Normal appendix. Vascular/Lymphatic: No aortic aneurysm. Scattered aortic atherosclerosis. A couple of subcentimeter, but prominent left para-aortic lymph nodes present, likely reactive. Reproductive: The uterus and ovaries are within normal limits for patient's  age. Fluid-filled right fallopian tube. Bilateral tubal ligation clips. Small volume free fluid in the pelvis. Other: No pneumoperitoneum, ascites, or mesenteric inflammation. Musculoskeletal: No acute fracture or destructive lesion. IMPRESSION: 1. Acute cystitis with ureteritis and bilateral pyelonephritis. No hydronephrosis or nephrolithiasis. Correlation with urinalysis recommended. 2. Bilateral tubal ligation with mild right-sided hydrosalpinx. Small volume free fluid in the pelvis, which is likely physiologic in a female of this age. Aortic Atherosclerosis (ICD10-I70.0). Electronically Signed   By: Rogelia Myers M.D.   On: 06/19/2024 15:01   CT Head Wo Contrast Result Date:  06/19/2024 CLINICAL DATA:  Head trauma EXAM: CT HEAD WITHOUT CONTRAST TECHNIQUE: Contiguous axial images were obtained from the base of the skull through the vertex without intravenous contrast. RADIATION DOSE REDUCTION: This exam was performed according to the departmental dose-optimization program which includes automated exposure control, adjustment of the mA and/or kV according to patient size and/or use of iterative reconstruction technique. COMPARISON:  MRI 12/27/2023, CT brain 12/27/2023 FINDINGS: Brain: No evidence of acute infarction, hemorrhage, hydrocephalus, extra-axial collection or mass lesion/mass effect. Vascular: No hyperdense vessel or unexpected calcification. Skull: Normal. Negative for fracture or focal lesion. Sinuses/Orbits: No acute finding. Other: None. IMPRESSION: Negative non contrasted CT appearance of the brain. Electronically Signed   By: Luke Bun M.D.   On: 06/19/2024 15:01   DG Chest Port 1 View Result Date: 06/19/2024 CLINICAL DATA:  Sepsis. EXAM: PORTABLE CHEST 1 VIEW COMPARISON:  03/13/2024 FINDINGS: The heart size and mediastinal contours are within normal limits. There is no evidence of pulmonary edema, consolidation, pneumothorax or pleural fluid. The visualized skeletal structures are unremarkable. IMPRESSION: No active disease. Electronically Signed   By: Marcey Moan M.D.   On: 06/19/2024 12:01     Procedures   Medications Ordered in the ED  lactated ringers  bolus 1,000 mL (0 mLs Intravenous Stopped 06/19/24 1252)  cefTRIAXone  (ROCEPHIN ) 1 g in sodium chloride  0.9 % 100 mL IVPB (0 g Intravenous Stopped 06/19/24 1258)  iohexol  (OMNIPAQUE ) 350 MG/ML injection 75 mL (75 mLs Intravenous Contrast Given 06/19/24 1428)  insulin  aspart (novoLOG ) injection 5 Units (5 Units Subcutaneous Given 06/19/24 1525)                                    Medical Decision Making 43 year old female with past medical history of poorly controlled diabetes presenting to the  emergency department today with concern for hypotension along with elevated blood glucose.  I will further evaluate the patient here with a sepsis workup.  Will obtain blood cultures as well as lactic acid.  I will cover the patient empirically for possible UTI given her symptoms.  Will obtain a CT scan given her abdominal tenderness to eval for other intra-abdominal pathology or ureteral stones.  The patient's blood pressure has improved with fluids with medics.  Give her an additional liter of fluid.  Above workup will also evaluate for DKA given her elevated glucose.  I will reevaluate for ultimate disposition.  The patient's labs are reassuring.  She does have a leukocytosis.  Blood pressures remained stable.  Her CT scan does show bilateral pyelonephritis.  Given these findings as well as the patient's poorly controlled diabetes and initial hypotension a call is placed to family medicine service for admission.  CRITICAL CARE Performed by: Prentice [name] Medicus   Total critical care time: 35 minutes  Critical care time was exclusive of separately billable procedures and  treating other patients.  Critical care was necessary to treat or prevent imminent or life-threatening deterioration.  Critical care was time spent personally by me on the following activities: development of treatment plan with patient and/or surrogate as well as nursing, discussions with consultants, evaluation of patient's response to treatment, examination of patient, obtaining history from patient or surrogate, ordering and performing treatments and interventions, ordering and review of laboratory studies, ordering and review of radiographic studies, pulse oximetry and re-evaluation of patient's condition.   Amount and/or Complexity of Data Reviewed Labs: ordered. Radiology: ordered.  Risk Prescription drug management. Decision regarding hospitalization.        Final diagnoses:  Pyelonephritis    ED Discharge Orders      None          Ula Prentice SAUNDERS, MD 06/19/24 [phone]

## 2024-06-19 NOTE — ED Notes (Signed)
 Pt stated she just completed antibiotics for UTI.

## 2024-06-19 NOTE — ED Triage Notes (Signed)
 Pt BIB by EMS from PCP. Pt complaining of pelvic pain x2 months that radiates to left. Pt also complaining of polyuria and dysuria. Pt was hypotensive with EMS. Pt also has concerns of hyperglycemia.   EMS Vitals  BP 60/42

## 2024-06-19 NOTE — Progress Notes (Signed)
 Shelby Brown [phone]

## 2024-06-19 NOTE — Hospital Course (Signed)
 This is a 43 year old female with PMH of T2DM and recurrent UTI presenting with hypotension, nausea, vomiting, and lower abdominal and bilateral flank pain with CT findings concerning for bilateral pyelonephritis.  Pyelonephritis, recurrent UTIs Patient was hypotensive in clinic on the day of admission and reported 2 days of nausea vomiting and pain. CT with findings concerning for acute cystitis and bilateral pyelonephritis.  Patient given maintenance IV fluids. Patient received IV Rocephin  (10/2-10/3) then was transitioned to oral cefadroxil  1 g BID (10/4-10/8) to complete a total of a 7 day course. Urine culture obtained. Patient was able to take good p.o. and vital signs were stable on day of discharge.  Type 2 diabetes She has been out of her insulin  for the past 10 days.  We discussed that she can come to the clinic to receive insulin  same day if she is out.  PCP recommendations: Ensure antibiotic completion, change treatment as needed based on sensitivity results Consider outpatient urology referral for recurrent UTIs Consider prophylactic daily antibiotic for recurrent UTIs Assess medication adherence and refill needs

## 2024-06-19 NOTE — Assessment & Plan Note (Signed)
 Likely in the setting of uncontrolled DM as above. - Fluconazole 150mg  x1 dose, can redose if persistent symptoms

## 2024-06-19 NOTE — H&P (Addendum)
 Hospital Admission History and Physical Service Pager: [phone]  Patient name: Shelby Brown Medical record number: [id number] Date of Birth: Nov 29, 1980 Age: 43 y.o. Gender: female  Primary Care Provider: Adele Song, MD Consultants: None Code Status: Full code Preferred Emergency Contact: Contact daughter [name] - speaks Research officer, trade union Information     Name Relation Home Work Mobile   [name],[name] Spouse [phone]  [phone]   R,[name] Daughter [phone]     [name] Knee Daughter   [phone]      Other Contacts   None on File      Chief Complaint: Abdominal pain  Assessment and Plan: Shelby Brown is a 43 y.o. female with past medical history of recurrent UTI and uncontrolled T2DM presenting with lower abdominal pain, nausea, vomiting for the past 2 days with hypotension in the office this morning. Differential for presentation of this includes likely pyelonephritis given history of recurrent UTI and pyelonephritis, suprapubic pain, nausea vomiting, UA with moderate leuks, and positive CVA tenderness with CT findings of bilateral pyelonephritis. DKA is less likely in this patient though she has been been without her insulin  for the past 10 days and glucose was 378 as she does not have an elevated anion gap or ketones in her urine.  Given her nausea and vomiting, gastroenteritis is a possibility; however, she has no diarrhea, and abdominal tenderness is truly localized to the suprapubic region with associated flank tenderness, making cystitis and pyelonephritis more likely.  Urine pregnancy test was negative making early pregnancy less likely.  She is also complaining of vulvar itching which I suspect is secondary to a yeast infection in the setting of poorly controlled diabetes and vaginal estrogen use.  Assessment & Plan Pyelonephritis Recurrent in the setting of uncontrolled DM. Prior UC with E coli sensitive to CTX, will continue and  narrow as able. -Admit to FM TS, [facility], MedSurg - Vital signs per floor - Will redose ceftriaxone  2 g every 24 hours tomorrow, narrow as able pending UC results -Tylenol  at 1000 mg every 6 hours as needed - Zofran  ODT 4 mg every 6 hours as needed - MIVF 100 mL/h LR - AM BMP and CBC - Add on urine culture to previous UA - Follow-up blood cultures Vaginal irritation Likely in the setting of uncontrolled DM as above. - Fluconazole 150mg  x1 dose, can redose if persistent symptoms DM (diabetes mellitus) with complications (HCC) Uncontrolled, difficulty with medication adherence outpatient but emphasized the importance of OP PCP follow if out of insulin . Given decrease PO, will restart insulin  and titrate as intake increases. -Semglee  15 units -mSSI Chronic health problem GERD: Famotidine  20 mg daily Diabetic peripheral neuropathy: gabapentin  100 mg TID Anemia: ferrous sulfate  325 mg every other day  FEN/GI: regular VTE Prophylaxis: lovenox   Disposition: med-surg  History of Present Illness:  Shelby Brown is a 43 y.o. female presenting with  lower abdominal pain, nausea, vomiting for the past 2 days with hypotension in the office this morning.  Went to clinic this AM, did not have symptoms but had scheduled appointment. Reports she did start to feel dizzy and unwell yesterday. Pain in head, legs and abdomen. Denies blood in urine. Not on menstrual cycle.   Report she did have lower abdominal pain and vulvar itching. Some dysuria. Having pain in her back too. Unsure if she has had fever. Had a sour taste in her mouth she attributes to fever.  Not eating or drinking much since 2 days ago. Having N/V during that time. Still  having some nausea now.   Feeling pain in her knees. Having a little bit of numbness in her hands.   In the ED, she had a CT that showed bilateral pyelonephritis. Her BP improved with IVF. Is S/p 1g rocephin . No leukocytosis.   Review Of Systems: Per  HPI  Pertinent Past Medical History: Uncontrolled T2DM HLD MDD Recurrent cystitis and pyelonephritis Remainder reviewed in history tab.   Pertinent Past Surgical History: C section BTL Remainder reviewed in history tab.   Pertinent Social History: Tobacco use: No  Alcohol use: No Other Substance use: No Lives with her husband and two daughters  Pertinent Family History: Father - deceased, cancer Mother - alive, healthy Two sisters alive with diabetes Two brothers alive, healthy Remainder reviewed in history tab.   Important Outpatient Medications: Citalopram  20mg  daily (not taking) Vaginal estrogen (using twice weekly) Famotidine  20mg  daily (not taking anymore) Iron daily (taking) Gabapentin  100mg  TID (taking) Basaglar  24 units (not taken in 10 days) Remainder reviewed in medication history.   Objective: BP (!) 115/94   Pulse 87   Temp 98 F (36.7 C) (Oral)   Resp 17   Ht 5' 2 (1.575 m)   Wt 47.9 kg   LMP  (LMP Unknown)   SpO2 100%   BMI 19.31 kg/m  Exam: General: well appearing female resting in bed Cardiovascular: RRR, no murmurs Respiratory: CTAB ABD: soft, nondistended, tenderness localized to the suprapubic region without fullness, no rebound or guarding MSK: + CVA tenderness bilaterally, no tenderness to the spinous processes, trapezius, or paraspinal muscles bilaterally  Labs:  CBC BMET  Recent Labs  Lab 06/19/24 1145  WBC 8.9  HGB 10.4*  HCT 31.5*  PLT 410*   Recent Labs  Lab 06/19/24 1325  NA 131*  K 4.2  CL 100  CO2 21*  BUN 19  CREATININE 1.24*  GLUCOSE 378*  CALCIUM 8.9     Anion gap 10 Lactic acid 1.5 Urine pregnancy negative  Urinalysis    Component Value Date/Time   COLORURINE YELLOW 06/19/2024 1325   APPEARANCEUR CLOUDY (A) 06/19/2024 1325   APPEARANCEUR Clear 12/31/2023 1448   LABSPEC 1.012 06/19/2024 1325   PHURINE 5.0 06/19/2024 1325   GLUCOSEU >=500 (A) 06/19/2024 1325   GLUCOSEU > 1000 mg/dL (A) 96/75/7990  7855   HGBUR SMALL (A) 06/19/2024 1325   BILIRUBINUR NEGATIVE 06/19/2024 1325   BILIRUBINUR moderate (A) 05/29/2024 1040   BILIRUBINUR Negative 12/31/2023 1448   KETONESUR NEGATIVE 06/19/2024 1325   PROTEINUR 30 (A) 06/19/2024 1325   UROBILINOGEN 1.0 05/29/2024 1040   UROBILINOGEN 0.2 11/28/2014 1725   NITRITE NEGATIVE 06/19/2024 1325   LEUKOCYTESUR MODERATE (A) 06/19/2024 1325    EKG: 11:51 NSR    Imaging Studies Performed: CT ABDOMEN PELVIS W CONTRAST Result Date: 06/19/2024 CLINICAL DATA:  Abdominal/flank pain, stone suspected EXAM: CT ABDOMEN AND PELVIS WITH CONTRAST TECHNIQUE: Multidetector CT imaging of the abdomen and pelvis was performed using the standard protocol following bolus administration of intravenous contrast. RADIATION DOSE REDUCTION: This exam was performed according to the departmental dose-optimization program which includes automated exposure control, adjustment of the mA and/or kV according to patient size and/or use of iterative reconstruction technique. CONTRAST:  75mL OMNIPAQUE  IOHEXOL  350 MG/ML SOLN COMPARISON:  05/29/2024 FINDINGS: Lower chest: No focal airspace consolidation or pleural effusion.Posterior bibasilar dependent atelectasis. Hepatobiliary: No mass.Focal fatty infiltration along the falciform ligament.No radiopaque stones or wall thickening of the gallbladder. No intrahepatic or extrahepatic biliary ductal dilation. The portal veins are patent.  Pancreas: No mass or main ductal dilation. No peripancreatic inflammation or fluid collection. Spleen: Normal size. No mass. Adrenals/Urinary Tract: No adrenal masses. No renal mass. Multifocal wedge-shaped regions of cortical hypoattenuation in both kidneys. Bilateral upper urothelial enhancement. No nephrolithiasis or hydronephrosis. Circumferential wall thickening of the urinary bladder. Stomach/Bowel: The stomach is decompressed without focal abnormality. No small bowel wall thickening or inflammation. No small  bowel obstruction.Normal appendix. Vascular/Lymphatic: No aortic aneurysm. Scattered aortic atherosclerosis. A couple of subcentimeter, but prominent left para-aortic lymph nodes present, likely reactive. Reproductive: The uterus and ovaries are within normal limits for patient's age. Fluid-filled right fallopian tube. Bilateral tubal ligation clips. Small volume free fluid in the pelvis. Other: No pneumoperitoneum, ascites, or mesenteric inflammation. Musculoskeletal: No acute fracture or destructive lesion. IMPRESSION: 1. Acute cystitis with ureteritis and bilateral pyelonephritis. No hydronephrosis or nephrolithiasis. Correlation with urinalysis recommended. 2. Bilateral tubal ligation with mild right-sided hydrosalpinx. Small volume free fluid in the pelvis, which is likely physiologic in a female of this age. Aortic Atherosclerosis (ICD10-I70.0). Electronically Signed   By: Rogelia Myers M.D.   On: 06/19/2024 15:01   CT Head Wo Contrast Result Date: 06/19/2024 CLINICAL DATA:  Head trauma EXAM: CT HEAD WITHOUT CONTRAST TECHNIQUE: Contiguous axial images were obtained from the base of the skull through the vertex without intravenous contrast. RADIATION DOSE REDUCTION: This exam was performed according to the departmental dose-optimization program which includes automated exposure control, adjustment of the mA and/or kV according to patient size and/or use of iterative reconstruction technique. COMPARISON:  MRI 12/27/2023, CT brain 12/27/2023 FINDINGS: Brain: No evidence of acute infarction, hemorrhage, hydrocephalus, extra-axial collection or mass lesion/mass effect. Vascular: No hyperdense vessel or unexpected calcification. Skull: Normal. Negative for fracture or focal lesion. Sinuses/Orbits: No acute finding. Other: None. IMPRESSION: Negative non contrasted CT appearance of the brain. Electronically Signed   By: Luke Bun M.D.   On: 06/19/2024 15:01   DG Chest Port 1 View Result Date:  06/19/2024 CLINICAL DATA:  Sepsis. EXAM: PORTABLE CHEST 1 VIEW COMPARISON:  03/13/2024 FINDINGS: The heart size and mediastinal contours are within normal limits. There is no evidence of pulmonary edema, consolidation, pneumothorax or pleural fluid. The visualized skeletal structures are unremarkable. IMPRESSION: No active disease. Electronically Signed   By: Marcey Moan M.D.   On: 06/19/2024 12:01      Alena Morrison, Elio, MD 06/19/2024, 4:18 PM PGY-1, [facility] Health Family Medicine  FPTS Intern pager: [phone], text pages welcome Secure chat group [facility] Teaching Service   Upper Level Addendum:   I have seen and evaluated this patient along with Dr. Belvia, MD and reviewed the above note, making necessary revisions as appropriate.  I agree with the medical decision making and physical exam as noted above.   Izetta Nap, DO PGY-3, [facility] Family Medicine Residency

## 2024-06-19 NOTE — Progress Notes (Signed)
    SUBJECTIVE:   CHIEF COMPLAINT / HPI:   ECR is a 43yo F that pf T2DM f/u.  - On arrival to clinic, pt was very dizzy and needed to be wheeled back in wheelchair - Per daughter, pt was dizzy this morning and had a fall after standing too quickly. She did not hit her head or LOC.  - Reports that pt has not been eating well and having some mild diarrhea - Denies N/V - Is not currently taking any medications. She is out of insulin  and is also not been taking this.    Insulin  regimen: Glargine 24u daily (pt has not been taking)  PERTINENT  PMH / PSH: T2DM  OBJECTIVE:   Ht 5' 2 (1.575 m)   Wt 105 lb 9.6 oz (47.9 kg)   LMP  (LMP Unknown)   BMI 19.31 kg/m   Gen: Pale, uncomfortable appearing but nontoxic woman sitting uncomfortably in chair. Speaking in soft voice, but normal WOB on RA.  CV: RRR, no murmurs. Resp: CTAB, no wheezing or crackles. Normal WOB on RA.   Ext: Moves all ext spontaneously Skin: Warm, well perfused     ASSESSMENT/PLAN:   Assessment & Plan Orthostatic hypotension Differential includes hypovolemia 2/2 poor PO intake, polyuria, and uncontrolled hyperglycemia. Pt has been off all meds, and likely has not had insulin  for >1 week now. Review of CGM shows average glucose to be 333 and GMI is 11.3%. Given symptomatic hypotension, EMS is called to transport patient to ED.   - Pt would benefit from fluid resuscitation and insulin  for glucose control - Pt signed out to EMS. Pt is being transported to ED.      Twyla Nearing, MD [facility]

## 2024-06-19 NOTE — Plan of Care (Signed)
 FMTS Interim Progress Note  S: Doing well. Continues to have left shoulder pain. Patient reports falling on it at home. Rates 9/10. Ate dinner without difficulty   O: BP 139/81 (BP Location: Left Arm)   Pulse 87   Temp 98 F (36.7 C) (Oral)   Resp 18   Ht 5' 2 (1.575 m)   Wt 47.9 kg   LMP  (LMP Unknown)   SpO2 100%   BMI 19.31 kg/m   General: Chronically ill-appearing, no acute distress Cardio: RRR, no murmur on exam Pulm: clear, No increased work of breathing Abdomen: Soft, bowel sounds present, nontender Extremity: No peripheral edema  MSK: radial pulse +2, limited ROM 2/2 to pain on L shoulder, tenderness to palpation over joint line   A/P: Pyelonephritis:  Stable. Afebrile.  Continue antibiotics per day team   L Shoulder Pain:  Apply lidocaine  patch  Reviewed imaging, CXR does not show an obvious fracture on L shoulder  Hold L shoulder X-ray now  Cleotilde Perkins, DO 06/19/2024, 9:21 PM PGY-3, [facility] Health Family Medicine Service pager [phone]

## 2024-06-19 NOTE — ED Notes (Signed)
 Pt ambulated down hall to bathroom with standby assist. Pt did not complain of any dizziness.

## 2024-06-19 NOTE — ED Notes (Signed)
Phlebotomy to collect second set of blood cultures

## 2024-06-19 NOTE — Assessment & Plan Note (Addendum)
 Recurrent in the setting of uncontrolled DM. Prior UC with E coli sensitive to CTX, will continue and narrow as able. -Admit to FM TS, [place], MedSurg - Vital signs per floor - Will redose ceftriaxone  2 g every 24 hours tomorrow, narrow as able pending UC results -Tylenol  at 1000 mg every 6 hours as needed - Zofran  ODT 4 mg every 6 hours as needed - MIVF 100 mL/h LR - AM BMP and CBC - Add on urine culture to previous UA - Follow-up blood cultures

## 2024-06-19 NOTE — Assessment & Plan Note (Signed)
 Uncontrolled, difficulty with medication adherence outpatient but emphasized the importance of OP PCP follow if out of insulin . Given decrease PO, will restart insulin  and titrate as intake increases. -Semglee  15 units -mSSI

## 2024-06-19 NOTE — Assessment & Plan Note (Addendum)
 GERD: Famotidine  20 mg daily  Diabetic peripheral neuropathy: gabapentin  100 mg TID Anemia: ferrous sulfate  325 mg every other day

## 2024-06-19 NOTE — Patient Instructions (Signed)
 1) Please go to the emergency department. I am worried that your blood pressure is too low. This could be from not eating enough.

## 2024-06-19 NOTE — ED Notes (Signed)
 Patient transported to CT

## 2024-06-20 ENCOUNTER — Other Ambulatory Visit (HOSPITAL_COMMUNITY): Payer: Self-pay

## 2024-06-20 ENCOUNTER — Other Ambulatory Visit: Payer: Self-pay

## 2024-06-20 DIAGNOSIS — M25512 Pain in left shoulder: Secondary | ICD-10-CM | POA: Insufficient documentation

## 2024-06-20 LAB — BASIC METABOLIC PANEL WITH GFR
Anion gap: 10 (ref 5–15)
BUN: 16 mg/dL (ref 6–20)
CO2: 23 mmol/L (ref 22–32)
Calcium: 8.8 mg/dL — ABNORMAL LOW (ref 8.9–10.3)
Chloride: 100 mmol/L (ref 98–111)
Creatinine, Ser: 1 mg/dL (ref 0.44–1.00)
GFR, Estimated: >60 mL/min (ref >60)
Glucose, Bld: 220 mg/dL — ABNORMAL HIGH (ref 70–99)
Potassium: 4.1 mmol/L (ref 3.5–5.1)
Sodium: 133 mmol/L — ABNORMAL LOW (ref 135–145)

## 2024-06-20 LAB — CBC
HCT: 27.3 % — ABNORMAL LOW (ref 36.0–46.0)
Hemoglobin: 8.9 g/dL — ABNORMAL LOW (ref 12.0–15.0)
MCH: 26.3 pg (ref 26.0–34.0)
MCHC: 32.6 g/dL (ref 30.0–36.0)
MCV: 80.8 fL (ref 80.0–100.0)
Platelets: 361 K/uL (ref 150–400)
RBC: 3.38 MIL/uL — ABNORMAL LOW (ref 3.87–5.11)
RDW: 14.2 % (ref 11.5–15.5)
WBC: 6.4 K/uL (ref 4.0–10.5)
nRBC: 0 % (ref 0.0–0.2)

## 2024-06-20 LAB — GLUCOSE, CAPILLARY
Glucose-Capillary: 186 mg/dL — ABNORMAL HIGH (ref 70–99)
Glucose-Capillary: 160 mg/dL — ABNORMAL HIGH (ref 70–99)
Glucose-Capillary: 47 mg/dL — ABNORMAL LOW (ref 70–99)
Glucose-Capillary: 52 mg/dL — ABNORMAL LOW (ref 70–99)
Glucose-Capillary: 177 mg/dL — ABNORMAL HIGH (ref 70–99)
Glucose-Capillary: 46 mg/dL — ABNORMAL LOW (ref 70–99)
Glucose-Capillary: 234 mg/dL — ABNORMAL HIGH (ref 70–99)

## 2024-06-20 LAB — URINE CULTURE

## 2024-06-20 LAB — TROPONIN I (HIGH SENSITIVITY)
Troponin I (High Sensitivity): 4 ng/L (ref <18)
Troponin I (High Sensitivity): 3 ng/L (ref <18)

## 2024-06-20 MED ORDER — BASAGLAR KWIKPEN 100 UNIT/ML ~~LOC~~ SOPN
18.0000 [IU] | PEN_INJECTOR | Freq: Every day | SUBCUTANEOUS | 0 refills | Status: DC
  Filled 2024-06-20: qty 15, 83d supply, fill #0

## 2024-06-20 MED ORDER — CEFADROXIL 500 MG PO CAPS
1000.0000 mg | ORAL_CAPSULE | Freq: Two times a day (BID) | ORAL | 0 refills | Status: AC
  Filled 2024-06-20: qty 28, 7d supply, fill #0

## 2024-06-20 MED ORDER — INSULIN GLARGINE 100 UNIT/ML ~~LOC~~ SOLN
5.0000 [IU] | Freq: Once | SUBCUTANEOUS | Status: AC
  Administered 2024-06-20: 5 [IU] via SUBCUTANEOUS
  Filled 2024-06-20: qty 0.05

## 2024-06-20 MED ORDER — GLUCOSE 40 % PO GEL
2.0000 | ORAL | Status: AC
  Administered 2024-06-20: 62 g via ORAL
  Filled 2024-06-20: qty 2.42

## 2024-06-20 MED ORDER — SODIUM CHLORIDE 0.9 % IV SOLN
2.0000 g | INTRAVENOUS | Status: AC
  Administered 2024-06-20: 2 g via INTRAVENOUS
  Filled 2024-06-20 (×2): qty 20

## 2024-06-20 MED ORDER — CEFADROXIL 500 MG PO CAPS
500.0000 mg | ORAL_CAPSULE | Freq: Two times a day (BID) | ORAL | 0 refills | Status: DC
Start: 2024-06-20 — End: 2024-06-20
  Filled 2024-06-20: qty 10, 5d supply, fill #0

## 2024-06-20 MED ORDER — LIDOCAINE 5 % EX PTCH: 1.0000 | MEDICATED_PATCH | CUTANEOUS | 0 refills | Status: DC

## 2024-06-20 MED ORDER — FAMOTIDINE 20 MG PO TABS
20.0000 mg | ORAL_TABLET | Freq: Every day | ORAL | 0 refills | Status: AC
  Filled 2024-06-20: qty 30, 30d supply, fill #0

## 2024-06-20 MED ORDER — BASAGLAR KWIKPEN 100 UNIT/ML ~~LOC~~ SOPN
24.0000 [IU] | PEN_INJECTOR | Freq: Every day | SUBCUTANEOUS | 0 refills | Status: DC
  Filled 2024-06-20: qty 9, 37d supply, fill #0

## 2024-06-20 MED ORDER — GLUCOSE 40 % PO GEL
ORAL | Status: DC
Start: 2024-06-20 — End: 2024-06-20
  Filled 2024-06-20: qty 1.21

## 2024-06-20 MED ORDER — ACETAMINOPHEN 500 MG PO TABS: 1000.0000 mg | ORAL_TABLET | Freq: Four times a day (QID) | ORAL | 0 refills | Status: AC | PRN

## 2024-06-20 MED ORDER — GLUCOSE 40 % PO GEL
ORAL | Status: AC
  Filled 2024-06-20: qty 1.21

## 2024-06-20 NOTE — Assessment & Plan Note (Signed)
 Unclear origin of left shoulder pain that started after falling on it at home. CXR did not have any fracture pathology - continue lidocaine  patch and tylenol  for pain relief - outpatient evaluation of pain

## 2024-06-20 NOTE — Progress Notes (Addendum)
 Patient called requesting that blood sugar be checked states it felt low. Initial CBG 52: snack (graham crackers with peanut butter and orange juice x1 given ate 100% and drank 100%). Recheck CBG 47 with pt c/o palpations and numbness around lips. Primary RN ordered hypoglycemia protocol with intervention of Glutose 40% oral gel 62grams. Medication overwritten due to patient's condition. Recheck CBG 46 provider en route to bedside. Ensure boosts x1 given (drank 100%) to patient. [name],MD came to bedside and assessed patient. Patient remained A&Ox4 able to follow commands and respond questions appropriately. Rechecked CBG 177.No further intervention needed at this time. Shelby Brown

## 2024-06-20 NOTE — Progress Notes (Signed)
  [facility] MEDICATION ASSISTANCE CARD Pharmacies please call [phone] for claim processing assistance.  Rx BIN: [id number] Rx Group: [id number] Rx PCN: [id number] Relationship Code: 1 Person Code: 01  Patient ID (MRN): [id number]    Patient Name: Shelby Brown    Patient DOB 04-12-1981   Discharge Date:06/21/2024  Expiration Date:06/26/2024 (must be filled within 7 days of discharge)   Dear [name] have been approved to have the prescriptions written by your discharging physician filled through our [facility] (Medication Assistance Through [facility]) program. This program allows for a one-time (no refills) 34-day supply of selected medications for a low copay amount.  The copay is $3.00 per prescription. For instance, if you have one prescription, you will pay $3.00; for two prescriptions, you pay $6.00; for three prescriptions, you pay $9.00; and so on. Only certain pharmacies are participating in this program with [facility]. You will need to select one of the pharmacies from the attached lists and take your prescriptions, this letter, and your photo ID to one of the participating pharmacies.  We are excited that you are able to use the [facility] program to get your medications. These prescriptions must be filled within 7 days of hospital discharge or they will no longer be valid for the [facility] program. Should you have any problems with your prescriptions please contact your case management team member at [phone] for [place] or [phone] for [facility].  Thank you, [name]    [facility] Program Pharmacies [place]. [facility], [facility], [facility]  [facility] [address] [place], [place] [address] [place], [place] [address], [place], [employer] [address], [place] Other [facility] [address] [place], [place] [facility] [address]  [facility] [id number], [place]

## 2024-06-20 NOTE — Plan of Care (Signed)
  Problem: Education: Goal: Ability to describe self-care measures that may prevent or decrease complications (Diabetes Survival Skills Education) will improve Outcome: Progressing Goal: Individualized Educational Video(s) Outcome: Progressing   Problem: Coping: Goal: Ability to adjust to condition or change in health will improve Outcome: Progressing   Problem: Fluid Volume: Goal: Ability to maintain a balanced intake and output will improve Outcome: Progressing   Problem: Health Behavior/Discharge Planning: Goal: Ability to identify and utilize available resources and services will improve Outcome: Progressing Goal: Ability to manage health-related needs will improve Outcome: Progressing   Problem: Metabolic: Goal: Ability to maintain appropriate glucose levels will improve Outcome: Progressing   Problem: Nutritional: Goal: Maintenance of adequate nutrition will improve Outcome: Progressing Goal: Progress toward achieving an optimal weight will improve Outcome: Progressing   Problem: Skin Integrity: Goal: Risk for impaired skin integrity will decrease Outcome: Progressing   Problem: Clinical Measurements: Goal: Ability to maintain clinical measurements within normal limits will improve Outcome: Progressing Goal: Will remain free from infection Outcome: Progressing Goal: Diagnostic test results will improve Outcome: Progressing Goal: Respiratory complications will improve Outcome: Progressing Goal: Cardiovascular complication will be avoided Outcome: Progressing   Problem: Nutrition: Goal: Adequate nutrition will be maintained Outcome: Progressing   Problem: Coping: Goal: Level of anxiety will decrease Outcome: Progressing   Problem: Safety: Goal: Ability to remain free from injury will improve Outcome: Progressing   Problem: Skin Integrity: Goal: Risk for impaired skin integrity will decrease Outcome: Progressing
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
## 2024-06-20 NOTE — Progress Notes (Signed)
 In person interpreter used for assessment, medication administration and discussion of care. Shelby Brown

## 2024-06-20 NOTE — Plan of Care (Signed)

## 2024-06-20 NOTE — Assessment & Plan Note (Addendum)
 Resolved s/p diflucan

## 2024-06-20 NOTE — Progress Notes (Addendum)
 [name],MD notified of  troponin level 4. Patient c/o  bladder spasms and anxiety. No PRN meds on ordered for symptoms. Post void bladder scan 0. Patient denies active CP at this time. No new orders given.  Shelby Brown

## 2024-06-20 NOTE — Plan of Care (Signed)
 FMTS was notified that pt had low blood sugars (46, 47) with symptoms of palpitations and numbness of the lips which was after dietary intervention. The next dose of Lantus  was cancelled and the pt seen at bedside by Dr. CANDIE Pinal, Dr. Lera, and RN Sherline Jubilee who acted as interpreter.   At bedside pt reported that palpitations have improved, and that her perioral numbness was gone. Her CGM showed rising blood glucose during our exam and had raised to 68 before we left.  Changing home Basaglar  dose from 24 units daily to 18 units. Explained reason for change to patient, and she voiced understanding. She had no further questions and continued to feel ready for discharge.  - D. Penne Lera; DO
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
## 2024-06-20 NOTE — Progress Notes (Signed)
 Awaiting for daughter crystal to be present for AVS discharge instructions per patient requests. Shelby Brown

## 2024-06-20 NOTE — Progress Notes (Signed)
 AVS instructions given and discussed with daughter Hydrographic surveyor). Bilat. IVS removed. Patient denies missing belongings. Patient taken to lobby via wheelchair. All questions and concerns answered. Sherline Jubilee

## 2024-06-20 NOTE — Assessment & Plan Note (Addendum)
 Reports new left sided burning chest pain and diffuse pleuritic pain this morning. No radiation of pain, but does have left shoulder pain and upper back pain from a previous fall. Suspect GERD likely; however, will rule out ACS.  - EKG with no ST elevations or T wave inversions - troponin 4

## 2024-06-20 NOTE — Assessment & Plan Note (Addendum)
 Suprapubic and CVA tenderness resolved. No leukocytosis. Suspect hgb drop was dilutional given over 1L IVF and reduction of all cell lines on CBC. She has had some straining with urination. - following blood cultures and urine cultures - CTX 2g today at 1200 - transition to PO cefadroxil  tomorrow 1 g BID to complete a total of 7 day course of antibiotics (10/4-10/8) - Pain management: Tylenol  at 1000 mg every 6 hours as needed, one dose received thus far - n/v management: Zofran  ODT 4 mg every 6 hours as needed, not required thus far - good PO intake so discontinue LR 100 ml/hr - PVR ordered

## 2024-06-20 NOTE — Assessment & Plan Note (Addendum)
 GERD: Famotidine  20 mg daily  Diabetic peripheral neuropathy: gabapentin  100 mg TID Anemia: ferrous sulfate  325 mg every other day

## 2024-06-20 NOTE — TOC Progression Note (Signed)
 Transition of Care [facility]) - Progression Note    Patient Details  Name: Shelby Brown MRN: [id number] Date of Birth: [birth year]  Transition of Care [facility] CM/SW Contact  [name], [place] Phone Number: 06/20/2024, 1:30 PM  Clinical Narrative:   CSW provided patient with community resources to assist her and her family at home. CSW explained resources to patient and which number to call for assistance.   No further action required from CSSW at this time. Please place reach out if any needs arise.    Expected Discharge Plan: Home/Self Care Barriers to Discharge: Continued Medical Work up               Expected Discharge Plan and Services In-house Referral: Interpreting Services, Conservator, museum/gallery Services: MATCH Program   Living arrangements for the past 2 months: Single Family Home                                       Social Drivers of Health (SDOH) Interventions SDOH Screenings   Food Insecurity: Food Insecurity Present (06/19/2024)  Housing: Low Risk  (06/19/2024)  Recent Concern: Housing - High Risk (05/30/2024)  Transportation Needs: Unmet Transportation Needs (06/19/2024)  Utilities: At Risk (06/19/2024)  Alcohol Screen: Low Risk  (11/01/2017)  Depression (PHQ2-9): High Risk (06/19/2024)  Tobacco Use: Low Risk  (06/19/2024)    Readmission Risk Interventions    12/28/2023    3:39 PM  Readmission Risk Prevention Plan  Post Dischage Appt Complete  Medication Screening Complete  Transportation Screening Complete

## 2024-06-20 NOTE — Discharge Instructions (Addendum)
 Dear Shelby Brown,   Thank you for letting us  participate in your care! In this section, you will find a brief hospital admission summary of why you were admitted to the hospital, what happened during your admission, your diagnosis/diagnoses, and recommended follow up.  Primary diagnosis: Pyelonephritis Treatment plan: please continue your course of antibiotics. It is very important to take the entire course, even if you are feeling better after only a few days to be sure the entire infection is cleared. You may continue to take over the counter medicines like tylenol  and ibuprofen  for pain and fever. Please follow up with your primary doctor to discuss your illness. Secondary diagnosis: Elevated blood sugar Treatment plan: please use insulin  as prescribed. We will send you home with a prescription today, but you will need to contact your doctor for refills. If you are having trouble affording your medications, there are assistance programs that can help.    POST-HOSPITAL & CARE INSTRUCTIONS We recommend following up with your PCP within 1 week from being discharged from the hospital. Please let PCP/Specialists know of any changes in medications that were made which you will be able to see in the medications section of this packet.  DOCTOR'S APPOINTMENTS & FOLLOW UP Future Appointments  Date Time Provider Department Center  06/24/2024  1:30 PM Adele Song, MD [facility]     Thank you for choosing [facility]! Take care and be well!  Family Medicine Teaching Service Inpatient Team [place]  [facility]  [address] [place], [place] [address] [phone]   [facility] assistance programs Crisis assistance programs  -[facility] Coordinated Entry Program. If you are experiencing homelessness in [place], [place] , your first point of contact should be Pensions consultant. You can reach Coordinated Entry by calling  ([phone]) [phone] or by emailing [email or web address] [email or web address].  Community access points: [employer] [phone], [place]) every Tuesday from 9am-10am. [facility] ([address] [place]) every Wednesday from 8am-9am.   -[place] Coordinated Re-entry Daniel [place]cal[place]: Dial 211 and request. Offers referrals to homeless shelters in the area.    -The [employer] 6101022513) offers several services to local families, as funding allows. The Emergency Assistance Program (EAP), which they administer, provides household goods, free food, clothing, and financial aid to people in need in the [place]  area. The EAP program does have some qualification, and counselors will interview clients for financial assistance by written referral only. Referrals need to be made by the Department of Social Services or by other EAP approved human services agencies or charities in the area.  -[facility] of [employer], which can be reached at [phone], offers emergency assistance programs for those in need of help, such as food, rent assistance, a soup kitchen, shelter, and clothing. They are based in [facility]   but provide a number of services to those that qualify for assistance.   [facility] Department of Social Services may be able to offer temporary financial assistance and cash grants for paying rent and utilities, Help may be provided for local county residents who may be experiencing personal crisis when other resources, including government programs, are not available. Call [phone]  -High [employer] is a [employer] agency, The organization can offer emergency assistance for paying rent, [employer], utilities, food, household products and furniture. They offer extensive emergency and transitional housing for families, children and single  women, and also run a Boy's and [employer]. Thrift  Shops, Secondary school teacher, and other aid offered too. [address] [place], [place]  [address], [phone]  -[place] Low Income Energy Assistance Program -- This is offered for [facility] families. The federal government created [employer] Program provides a one-time cash grant payment to help eligible low-income families pay their electric and heating bills. [address] [place], [place]  [address], [phone]  -[place] Emergency Assistance -- A program offers emergency utility and rent funds for greater [place] area residents. The program can also provide counseling and referrals to charities and government programs. Also provides food and a free meal program that serves lunch Mondays - Saturdays and dinner seven days per week to individuals in the community. [address] [place], [place]  [address], [phone]  -[employer] - Offers affordable apartment and housing communities across      [place] and [place]. The low income and seniors can access public housing, rental assistance to qualified applicants, and apply for the section 8 rent subsidy program. Other programs include Chiropractor and Engineer, maintenance. [address] [place], [place]  [address], dial [phone].  -The [facility] provides transitional housing to veterans and the disabled. Clients will also access other services too, including assistance in applying for Disability, life skills classes, case management, and assistance in finding permanent housing. [address], [place], [place]  [address], call [phone]  -[place] Transitional Housing through [employer] is for people who were just evicted or that are formerly homeless. The non-profit will also help then gain self-sufficiency, find a home or apartment to live in, and also provides  information on rent assistance when needed. Phone [phone]  -The [place] [employer] [facility] helps low income, elderly, or disabled residents in seven counties in the [place] ([place], [place], [place], [place], [place], [place], [place], and [place]) save energy and reduce their utility bills by improving energy efficiency. Phone [phone].  -[employer] is located in the [place] [facility] in the [employer], [address], [place], [place] [address]. Parking is in the rear of the building. Phone: [phone]   General Email: info@gsohc .org  GHC provides free housing counseling assistance in locating affordable rental housing or housing with support services for families and individuals in crisis and the chronically homeless. We provide potential resources for other housing needs like utilities. Our trained counselors also work with clients on budgeting and financial literacy in effort to empower them to take control of their financial situations. [employer] collaborates with homeless service providers and other stakeholders as part of the [employer] COC (Continuum of Care). The (COC) is a regional/local planning body that coordinates housing and services funding for homeless families and individuals. The role of [facility] in the COC is through housing counseling to work with people we serve on diversion strategies for those that are at imminent risk of becoming homeless. We also work with the Coordinated Assessment/Entry Specialist who attempts to find temporary solutions and/or connects the people to Housing First, Rapid Re-housing or transitional housing programs. Our Homelessness Prevention Housing Counselors meet with clients on business days (Monday-Fridays, except scheduled holidays) from 8:30 am to 4:30 pm.  Legal assistance for evictions, foreclosure, and more -If you need free  legal advice on civil issues, such as foreclosures, evictions, Electronics engineer, government programs, domestic issues and  more, Legal Aid of [place]  [facility]) is a Associate Professor firm that provides free legal services and counsel to lower income people, seniors, disabled, and others, The goal is to ensure everyone has access to justice and fair representation. Call them at [phone].  [facility] for Housing and Community Studies can provide info about obtaining legal assistance with evictions. Phone [phone].  Data processing manager  The [employer], [employer]. offers job and Dispensing optician. Resources are focused on helping students obtain the skills and experiences that are necessary to compete in today's challenging and tight job market. The non-profit faith-based community action agency offers internship trainings as well as classroom instruction. Classes are tailored to meet the needs of people in the [facility] region. [place], [place] [address], [phone]  Foreclosure prevention/Debt Services Family Services of the [employer] Credit Counseling Service inludes debt and foreclosure prevention programs for local families. This includes money management, financial advice, budget review and development of a written action plan with a Pensions consultant to help solve specific individual financial problems. In addition, housing and mortgage counselors can also provide pre- and post-purchase homeownership counseling, default resolution counseling (to prevent foreclosure) and reverse mortgage counseling. A Debt Management Program allows people and families with a high level of credit card or medical debt to consolidate and repay consumer debt and loans to creditors and rebuild positive credit ratings and scores. Contact [phone] [phone].  Community clinics in [place] -Health Department [facility]: [address] [place], [place], [address]. [phone].  -Health Department [place] [facility]: [address], [facility], [address]. [phone].  -[facility] offers medical care through a group of doctors, pharmacies and other healthcare related agencies that offer services for low income, uninsured adults in [place]. Also offers adult Dental care and assistance with applying for an [employer]. Call [phone].   [facility]. This center provides low-cost health care to those without health insurance. Services offered include an onsite pharmacy. Phone [phone]. [address], [place].  -Medication Assistance Program serves as a link between pharmaceutical companies and patients to provide low cost or free prescription medications. This service is available for residents who meet certain income restrictions and have no insurance coverage. PLEASE CALL [phone] [place]) OR [phone] ([place])  -One Step Further: Materials engineer, The [employer] Support & Nutrition Program, [employer]. Call [phone]/ [phone].  Food pantry and assistance -[facility]: [address], [place] [address]. Phone [phone]  -[facility]: [address] [place], [place] [address]. [phone].  -Missionary Ministry: has the purpose of visiting the sick and shut-ins and provide for needs in the surrounding communities. Call [phone]. Email: [email or web address] [email or web address] This program provides: Food box for seniors, Financial assistance, Food to meet basic nutritional needs.  -Meals on Wheels with Senior Resources: [facility] residents age 42 and over who are homebound and unable to obtain and prepare a nutritious meal for themselves are eligible for this service. There may be a waiting list in certain parts of [facility] if the route in  that area is full. If you are in [facility] and [place] call [phone] to register. For all other areas call [phone] to register.  -Greater Dietitian: https://findfood.BargainContractor.[email or web address]  TRANSPORTATION: -[employer] Department of Health: Call [facility] and [employer] at [phone] for details. AttractionGuides.es  -  Access [place]: Access [place] is the [employer]'s shared-ride transportation service for eligible riders who have a disability that prevents them from riding the fixed route bus. Call [phone]. Access [place] riders must pay a fare of $1.50 per trip, or may purchase a 10-ride punch card for $14.00 ($1.40 per ride) or a 40-ride punch card for $48.00 ($1.20 per ride).  -The Shepherd's WHEELS rideshare transportation service is provided for senior citizens (60+) who live independently within [place] city limits and are unable to drive or have limited access to transportation. Call [phone] to schedule an appointment.  -[place] Transportation: For Medicare or Medicaid recipients call [phone]?[facility] Ambulance, wheelchair fleeta, and ambulatory quotes available.   Additional resources for assistance: -FSP- 24/7 line ([phone]) [facility]: [phone]  [place] 2-1-1 is another useful way to locate resources in the community. Visit ShedSizes.ch to find service information online. If you need additional assistance, 2-1-1 Referral Specialists are available 24 hours a day, every day by dialing 2-1-1 or [phone] from any phone. The call is free, confidential, and available in any language.  Affordable Housing Search http://www.nchousingsearch.[facility])   M-F 8a-3p [address]  [place], [place] [address] [phone] Services include: laundry, barbering, support groups,  case management, phone & computer access, showers, AA/NA mtgs, mental health/substance abuse nurse, job skills class, disability information, VA assistance, spiritual classes, etc. Winter Shelter available when temperatures are less than 32 degrees.   HOMELESS SHELTERS [facility] at [facility]- Call [phone] ext. [phone] or ext. 336. Located at [address], [place], [place] [address]  [facility] Mens Shelter- Call [phone]. Located at [address], [place] [address].  [facility] [employer]. Call [phone]. Office located at [address], [place] [address].  [facility] Family Housing through [place] [phone].  [facility]ter- Call [phone]. Located at [address] [place], [place], [place] [address].  [facility] the Inn-For Pregnant mothers. Call [phone]. Located at [address] [place], [address].  [facility]-For men in [place]. Call [phone]. [facility]ter in [place]. Call [phone].  Home of [employer] for [employer] [phone]. Office located at [address] [place], [address].  [employer] be agreeable to help with chores. Call [phone] ext. [phone].  Men's: [address] [place], [place] [address]. Women's: [facility]  [address] [place], [place] [address]

## 2024-06-20 NOTE — Discharge Summary (Addendum)
 Family Medicine Teaching [facility] Discharge Summary  Patient name: Shelby Brown Medical record number: [id number] Date of birth: [birth year] Age: 43 y.o. Gender: female Date of Admission: 06/19/2024  Date of Discharge: 06/20/24 Admitting Physician: Elio Alena Morrison, MD  Primary Care Provider: Adele Song, MD Consultants: none  Indication for Hospitalization: pyelonephritis with poor PO due to nausea/vomiting  Discharge Diagnoses/Problem List:  Principal Problem for Admission: pyelonephritis Other Problems addressed during stay:  Active Problems:   Chest pain   Chronic health problem   DM (diabetes mellitus) with complications (HCC)   Pyelonephritis   Vaginal irritation   Left shoulder pain   The above problem list has been updated and reviewed for accuracy, including the initial reason for admission.   Brief Hospital Course:  This is a 43 year old female with PMH of T2DM and recurrent UTI presenting with hypotension, nausea, vomiting, and lower abdominal and bilateral flank pain with CT findings concerning for bilateral pyelonephritis.  Pyelonephritis, recurrent UTIs Patient was hypotensive in clinic on the day of admission and reported 2 days of nausea vomiting and pain. CT with findings concerning for acute cystitis and bilateral pyelonephritis.  Patient given maintenance IV fluids. Patient received IV Rocephin  (10/2-10/3) then was transitioned to oral cefadroxil  1 g BID (10/4-10/8) to complete a total of a 7 day course. Urine culture obtained. Patient was able to take good p.o. and vital signs were stable on day of discharge.  Suspected Yeast infection Patient complaining of vulvar itching on admission.  Diflucan 150 mg p.o. one-time given and symptoms resolved.  Suspect yeast infection occurred in association with poorly controlled diabetes.  Type 2 diabetes She has been out of her insulin  for the past 10 days.  We discussed that she can come to the  clinic to receive insulin  same day if she is out.  Pain Patient with multiple pain complaints during admission: Transient chest pain (negative troponin and EKG), left shoulder pain (suspected due to fall prior to admission, no fracture evident on CXR), left lower extremity pain (sounds like there may be a component of lumbar radiculopathy, no cauda equina symptoms).  No concerning symptoms warranting inpatient evaluation.  Tylenol  for pain management as needed. Continued home gabapentin .   PCP recommendations: Ensure antibiotic completion, change treatment as needed based on urine cultrue and sensitivity results Consider outpatient urology referral for recurrent UTIs Assess medication adherence and refill needs H pylori testing for transient burning chest pain/GERD Consider further evaluation of pain complaints    Results/Tests Pending at Time of Discharge:  Unresulted Labs (From admission, onward)     Start     Ordered   06/19/24 1325  Urine Culture  Once,   R        06/19/24 1325             Disposition: home  Discharge Condition: stable  Discharge Exam:  Vitals:   06/20/24 1445 06/20/24 1550  BP: (!) 147/80 122/77  Pulse: (!) 110 (!) 106  Resp: 16 16  Temp: (S) 97.8 F (36.6 C) (!) 97.5 F (36.4 C)  SpO2: 100% 100%   General: well appearing female sitting up in bed Cardiovascular: RRR, no m/r/g Respiratory: CTAB Abdomen: nontender, soft Chest: no costochondral tenderness Back: No CVA tenderness  Significant Procedures: none  Significant Labs and Imaging:  Recent Labs  Lab 06/19/24 1145 06/20/24 0516  WBC 8.9 6.4  HGB 10.4* 8.9*  HCT 31.5* 27.3*  PLT 410* 361   Recent Labs  Lab  06/19/24 1325 06/20/24 0516  NA 131* 133*  K 4.2 4.1  CL 100 100  CO2 21* 23  GLUCOSE 378* 220*  BUN 19 16  CREATININE 1.24* 1.00  CALCIUM 8.9 8.8*  ALKPHOS 57  --   AST 15  --   ALT 12  --   ALBUMIN 3.0*  --     CT ABDOMEN PELVIS W CONTRAST Result Date:  06/19/2024 CLINICAL DATA:  Abdominal/flank pain, stone suspected EXAM: CT ABDOMEN AND PELVIS WITH CONTRAST TECHNIQUE: Multidetector CT imaging of the abdomen and pelvis was performed using the standard protocol following bolus administration of intravenous contrast. RADIATION DOSE REDUCTION: This exam was performed according to the departmental dose-optimization program which includes automated exposure control, adjustment of the mA and/or kV according to patient size and/or use of iterative reconstruction technique. CONTRAST:  75mL OMNIPAQUE  IOHEXOL  350 MG/ML SOLN COMPARISON:  05/29/2024 FINDINGS: Lower chest: No focal airspace consolidation or pleural effusion.Posterior bibasilar dependent atelectasis. Hepatobiliary: No mass.Focal fatty infiltration along the falciform ligament.No radiopaque stones or wall thickening of the gallbladder. No intrahepatic or extrahepatic biliary ductal dilation. The portal veins are patent. Pancreas: No mass or main ductal dilation. No peripancreatic inflammation or fluid collection. Spleen: Normal size. No mass. Adrenals/Urinary Tract: No adrenal masses. No renal mass. Multifocal wedge-shaped regions of cortical hypoattenuation in both kidneys. Bilateral upper urothelial enhancement. No nephrolithiasis or hydronephrosis. Circumferential wall thickening of the urinary bladder. Stomach/Bowel: The stomach is decompressed without focal abnormality. No small bowel wall thickening or inflammation. No small bowel obstruction.Normal appendix. Vascular/Lymphatic: No aortic aneurysm. Scattered aortic atherosclerosis. A couple of subcentimeter, but prominent left para-aortic lymph nodes present, likely reactive. Reproductive: The uterus and ovaries are within normal limits for patient's age. Fluid-filled right fallopian tube. Bilateral tubal ligation clips. Small volume free fluid in the pelvis. Other: No pneumoperitoneum, ascites, or mesenteric inflammation. Musculoskeletal: No acute fracture  or destructive lesion. IMPRESSION: 1. Acute cystitis with ureteritis and bilateral pyelonephritis. No hydronephrosis or nephrolithiasis. Correlation with urinalysis recommended. 2. Bilateral tubal ligation with mild right-sided hydrosalpinx. Small volume free fluid in the pelvis, which is likely physiologic in a female of this age. Aortic Atherosclerosis (ICD10-I70.0). Electronically Signed   By: Rogelia Myers M.D.   On: 06/19/2024 15:01   CT Head Wo Contrast Result Date: 06/19/2024 CLINICAL DATA:  Head trauma EXAM: CT HEAD WITHOUT CONTRAST TECHNIQUE: Contiguous axial images were obtained from the base of the skull through the vertex without intravenous contrast. RADIATION DOSE REDUCTION: This exam was performed according to the departmental dose-optimization program which includes automated exposure control, adjustment of the mA and/or kV according to patient size and/or use of iterative reconstruction technique. COMPARISON:  MRI 12/27/2023, CT brain 12/27/2023 FINDINGS: Brain: No evidence of acute infarction, hemorrhage, hydrocephalus, extra-axial collection or mass lesion/mass effect. Vascular: No hyperdense vessel or unexpected calcification. Skull: Normal. Negative for fracture or focal lesion. Sinuses/Orbits: No acute finding. Other: None. IMPRESSION: Negative non contrasted CT appearance of the brain. Electronically Signed   By: Luke Bun M.D.   On: 06/19/2024 15:01   DG Chest Port 1 View Result Date: 06/19/2024 CLINICAL DATA:  Sepsis. EXAM: PORTABLE CHEST 1 VIEW COMPARISON:  03/13/2024 FINDINGS: The heart size and mediastinal contours are within normal limits. There is no evidence of pulmonary edema, consolidation, pneumothorax or pleural fluid. The visualized skeletal structures are unremarkable. IMPRESSION: No active disease. Electronically Signed   By: Marcey Moan M.D.   On: 06/19/2024 12:01     Discharge Medications:  Allergies as  of 06/20/2024       Reactions   Metformin  And  Related Nausea Only   Poor PO intake and treated with prescription antiemetic agents        Medication List     TAKE these medications    acetaminophen  500 MG tablet Commonly known as: TYLENOL  Take 2 tablets (1,000 mg total) by mouth every 6 (six) hours as needed (first line pain). What changed:  how much to take reasons to take this   Basaglar  KwikPen 100 UNIT/ML Inject 18 Units into the skin daily. What changed:  how much to take additional instructions   cefadroxil  500 MG capsule Commonly known as: DURICEF Take 2 capsules (1,000 mg total) by mouth 2 (two) times daily for 7 days. Start taking on: June 21, 2024   famotidine  20 MG tablet Commonly known as: PEPCID  Tome 1 tableta (20 mg en total) por va oral diariamente. (Take 1 tablet (20 mg total) by mouth daily.)   FeroSul 325 (65 Fe) MG tablet Generic drug: ferrous sulfate  Tome 1 tableta (325 mg en total) por va oral [employer]. (Take 1 tablet (325 mg total) by mouth every other day.)   gabapentin  100 MG capsule Commonly known as: NEURONTIN  Tomar 1 cpsula (100 mg en total) por va oral 3 (tres) veces al [employer]. (Take 1 capsule (100 mg total) by mouth 3 (three) times daily.)   lidocaine  5 % Commonly known as: LIDODERM  Place 1 patch onto the skin daily. Remove & Discard patch within 12 hours or as directed by MD   TRUEplus 5-Bevel Pen Needles 31G X 8 MM Misc Generic drug: Insulin  Pen Needle Use one pen needle twice daily.        Discharge Instructions: Please refer to Patient Instructions section of EMR for full details.  Patient was counseled important signs and symptoms that should prompt return to medical care, changes in medications, dietary instructions, activity restrictions, and follow up appointments.   Follow-Up Appointments:  Follow-up Information     Adele Song, MD Follow up.   Specialty: Family Medicine Why: Eugenio, 10/7 @ 1:30 PM Contact information: [address] [place]  [address] [phone]                 Alena Morrison, Elio, MD 06/20/2024, 4:45 PM PGY-1, [facility]   Upper Level Attestation I have seen and examined the patient with the resident. I agree with the history, physical, and assessment above.  Lucie Pinal, DO PGY-2, Family Medicine
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
## 2024-06-22 ENCOUNTER — Telehealth: Payer: Self-pay | Admitting: Student

## 2024-06-22 LAB — BLOOD CULTURE ID PANEL (REFLEXED) - BCID2

## 2024-06-22 NOTE — Telephone Encounter (Signed)
 S: 0400, notified of positive blood culture via secure chat by hospitalist.  Hospitalist asked FMTS call micro lab for report. 0411, spoke with micro lab.  They report gram-positive/gram-negative species in 1/3 bottles, specifically anaerobic bottle. [id number], reviewed chart.  Recent hospitalization for suspected pyelonephritis.  Pyelonephritis would fit clinical picture for gram negative rod, however multiple species and blood culture-not differentiated at this time.  I do suspect this to be a contaminant.  If this is a true gram-negative bacteremia, she will likely not need to return to the hospital unless she is worsening.  If this is a true gram-positive bacteremia, she will likely need to return to the hospital for further workup including TEE. 0455: Attempted to call patient using [employer] (Spanish). Left HIPAA compliant voicemail.  Request patient call back our after-hours line.  P: - Will sign out to day team, so they are aware - Will forward to PCP (she will see in clinic next week 10/7), and will forward results to prior attending on service, and will forward results to current attending on service - IF patient feels improved and doing well is safe to wait for results - IF patient is worsening, or has not shown any improvement recommend she return to ED

## 2024-06-24 ENCOUNTER — Ambulatory Visit: Payer: Self-pay | Admitting: Family Medicine

## 2024-06-24 VITALS — BP 113/74 | HR 87 | Wt 110.6 lb

## 2024-06-24 DIAGNOSIS — E118 Type 2 diabetes mellitus with unspecified complications: Secondary | ICD-10-CM

## 2024-06-24 DIAGNOSIS — N10 Acute tubulo-interstitial nephritis: Secondary | ICD-10-CM

## 2024-06-24 DIAGNOSIS — E1165 Type 2 diabetes mellitus with hyperglycemia: Secondary | ICD-10-CM

## 2024-06-24 DIAGNOSIS — N12 Tubulo-interstitial nephritis, not specified as acute or chronic: Secondary | ICD-10-CM

## 2024-06-24 DIAGNOSIS — Z0000 Encounter for general adult medical examination without abnormal findings: Secondary | ICD-10-CM

## 2024-06-24 LAB — CULTURE, BLOOD (ROUTINE X 2)
Culture  Setup Time: NO GROWTH — AB
Culture: NO GROWTH
Special Requests: ADEQUATE

## 2024-06-24 MED ORDER — BASAGLAR KWIKPEN 100 UNIT/ML ~~LOC~~ SOPN: 24.0000 [IU] | PEN_INJECTOR | Freq: Every day | SUBCUTANEOUS | 0 refills | Status: DC

## 2024-06-24 NOTE — Assessment & Plan Note (Signed)
 Patient has been taking antibiotics as instructed.  Reports overall symptoms are improving.  Appears well today with stable vitals and physical exam.  Sent referral to urology given recurrent pyelo.

## 2024-06-24 NOTE — Patient Instructions (Addendum)
 Thank you for coming in today! Here is a summary of what we discussed:  -Please be sure to finish all the antibiotics.  -Please start taking 24 units of insulin  daily. Dr Koval will see you next week and might add on meal time insulin .  -If you have concerns or questions about any of your medicines, please let us  know. You can always call our clinic number  -I sent a referral to the urologists and the case managers- you should hear from them in the next few weeks   Please call the clinic at [phone] if your symptoms worsen or you have any concerns.  Best, Dr Adele Pale por venir hoy!  Aqu hay un resumen de lo que discutimos:  -Por favor, asegrese de terminar todos los antibiticos.  -Por favor, comience a tomar 24 unidades de insulina diariamente. El Dr. Amalia lo ver la prxima semana y podra agregar insulina para las comidas.  -Si tiene dudas o preguntas sobre alguno de sus medicamentos, por favor hganoslo saber. Siempre puede llamar a nuestro nmero de Financial risk analyst.  -He enviado una remisin a los urlogos y a los gestores de casos; debera recibir noticias de ellos en las prximas semanas.  Por favor llame a la clnica al [phone] si sus sntomas empeoran o si tiene alguna preocupacin.  Atentamente,  Dr. Adele

## 2024-06-24 NOTE — Assessment & Plan Note (Signed)
 Reviewed CGM data, patient has been out of range around 70% of the time.  Average readings in the 300s.  Will increase basal insulin  to 24 units daily.  Scheduled for close follow-up with [facility] pharmacist for next week, likely will add mealtime insulin  at that time.  Provided patient with 2 boxes of CGM's and applied new CGM today. Also sent referral for case managers given patient's barriers to chronic care management.

## 2024-06-24 NOTE — Progress Notes (Signed)
     SUBJECTIVE:   CHIEF COMPLAINT / HPI:   Shelby Brown presents today for hospital follow up.   Hospitalized at [facility] from 10/2 to 10/3, for pyelonephritis.  Hospital follow recommendations: Ensure antibiotic completion, change treatment as needed based on urine culture and sensitivity results Consider outpatient urology referral for recurrent UTIs Assess medication adherence and refill needs H pylori testing for transient burning chest pain/GERD Consider further evaluation of pain complaints  Patient accompanied by daughter.  In person Spanish and interpreter used for visit.  Since discharge, patient reports taking ABX, overall feels better but still having some lower abd discomfort, some dysuria.   CGM report- 68% very high, average 321, 1% low or very low.  Has been taking 18 units long-acting insulin  daily.  Daughter states they have 3 vials of insulin  at home.  Agreeable to [facility] referral for case management  Can do H. pylori testing in the future, patient has been taking Pepcid .  States she still having reflux symptoms.  Healthcare maintenance --Needs Pap --Needs mammogram  Sent MyChart sign up to daughter's phone number.  PERTINENT  PMH / PSH: As above  OBJECTIVE:   BP 113/74   Pulse 87   Wt 110 lb 9.6 oz (50.2 kg)   LMP  (LMP Unknown)   SpO2 99%   BMI 20.23 kg/m   - General: No acute distress. Awake and conversant. - Eyes: Normal conjunctiva, anicteric. Round symmetric pupils. - ENT: Hearing grossly intact. No nasal discharge. - Neck: Neck is supple. No masses or thyromegaly. - Respiratory: Respirations are non-labored. - Skin: No visible rashes or ulcers. - Psych: Alert and oriented. Cooperative, Appropriate mood and affect, Normal judgment. - MSK: Ambulating with some assistance from daughter/granddaughter - Neuro: Sensation and CN II-XII grossly normal.   ASSESSMENT/PLAN:   Assessment & Plan DM (diabetes mellitus) with complications  (HCC) Reviewed CGM data, patient has been out of range around 70% of the time.  Average readings in the 300s.  Will increase basal insulin  to 24 units daily.  Scheduled for close follow-up with [facility] pharmacist for next week, likely will add mealtime insulin  at that time.  Provided patient with 2 boxes of CGM's and applied new CGM today. Also sent referral for case managers given patient's barriers to chronic care management. Pyelonephritis Patient has been taking antibiotics as instructed.  Reports overall symptoms are improving.  Appears well today with stable vitals and physical exam.  Sent referral to urology given recurrent pyelo. Healthcare maintenance Patient due for Pap, mammogram, vaccinations.  Scheduled for physical exam with me in a few weeks, will plan to address these needs at that time.     Rea Raring, MD [facility]

## 2024-06-25 ENCOUNTER — Telehealth: Payer: Self-pay | Admitting: *Deleted

## 2024-06-25 NOTE — Progress Notes (Unsigned)
 Complex Care Management Note Care Guide Note  06/25/2024 Name: Shelby Brown MRN: [id number] DOB: [birth year]   Used [employer]: PI#[id number] named Mauricio  Complex Care Management Outreach Attempts: An unsuccessful telephone outreach was attempted today to offer the patient information about available complex care management services.  Follow Up Plan:  Additional outreach attempts will be made to offer the patient complex care management information and services.   Encounter Outcome:  No Answer  Harlene Satterfield  [facility]  [facility], [facility] Guide  Direct Dial: [phone]  Fax [phone]

## 2024-06-26 NOTE — Progress Notes (Unsigned)
 Complex Care Management Note Care Guide Note  06/26/2024 Name: Shelby Brown MRN: [id number] DOB: [birth year]   Complex Care Management Outreach Attempts: A second unsuccessful outreach was attempted today to offer the patient with information about available complex care management services.  Follow Up Plan:  Additional outreach attempts will be made to offer the patient complex care management information and services.   Encounter Outcome:  No Answer  Harlene Satterfield  [facility]  [facility], [facility] Guide  Direct Dial: [phone]  Fax [phone]

## 2024-06-27 NOTE — Progress Notes (Signed)
 Complex Care Management Note Care Guide Note  06/27/2024 Name: Mialani Reicks MRN: [id number] DOB: [birth year]   Complex Care Management Outreach Attempts: A third unsuccessful outreach was attempted today to offer the patient with information about available complex care management services.  Follow Up Plan:  No further outreach attempts will be made at this time. We have been unable to contact the patient to offer or enroll patient in complex care management services.  Encounter Outcome:  No Answer  Harlene Satterfield  [facility]  [facility], [facility] Guide  Direct Dial: [phone]  Fax [phone]

## 2024-07-03 ENCOUNTER — Encounter: Payer: Self-pay | Admitting: Pharmacist

## 2024-07-03 ENCOUNTER — Other Ambulatory Visit: Payer: Self-pay

## 2024-07-03 ENCOUNTER — Ambulatory Visit (INDEPENDENT_AMBULATORY_CARE_PROVIDER_SITE_OTHER): Payer: Self-pay | Admitting: Pharmacist

## 2024-07-03 ENCOUNTER — Other Ambulatory Visit (HOSPITAL_COMMUNITY): Payer: Self-pay

## 2024-07-03 VITALS — BP 119/72 | HR 86 | Wt 114.8 lb

## 2024-07-03 DIAGNOSIS — E1165 Type 2 diabetes mellitus with hyperglycemia: Secondary | ICD-10-CM

## 2024-07-03 DIAGNOSIS — Z794 Long term (current) use of insulin: Secondary | ICD-10-CM

## 2024-07-03 MED ORDER — INSULIN LISPRO (1 UNIT DIAL) 100 UNIT/ML (KWIKPEN)
4.0000 [IU] | PEN_INJECTOR | Freq: Every day | SUBCUTANEOUS | 3 refills | Status: DC
Start: 2024-07-03 — End: 2024-07-24
  Filled 2024-07-03: qty 3, 28d supply, fill #0

## 2024-07-03 MED ORDER — BASAGLAR KWIKPEN 100 UNIT/ML ~~LOC~~ SOPN
20.0000 [IU] | PEN_INJECTOR | Freq: Every day | SUBCUTANEOUS | 3 refills | Status: DC
Start: 2024-07-03 — End: 2024-07-03
  Filled 2024-07-03: qty 15, 75d supply, fill #0

## 2024-07-03 MED ORDER — BASAGLAR KWIKPEN 100 UNIT/ML ~~LOC~~ SOPN
20.0000 [IU] | PEN_INJECTOR | Freq: Every day | SUBCUTANEOUS | 3 refills | Status: DC
Start: 2024-07-03 — End: 2024-07-24
  Filled 2024-07-03: qty 15, 75d supply, fill #0
  Filled 2024-07-03: qty 6, 30d supply, fill #0

## 2024-07-03 MED ORDER — INSULIN LISPRO (1 UNIT DIAL) 100 UNIT/ML (KWIKPEN)
4.0000 [IU] | PEN_INJECTOR | Freq: Every day | SUBCUTANEOUS | 3 refills | Status: DC
  Filled 2024-07-03: qty 15, fill #0

## 2024-07-03 NOTE — Patient Instructions (Addendum)
  Qu bueno verte hoy! Tu nivel ideal de azcar en sangre es de 80 a 130 antes de comer y menos de 180 despus. Si notas que tu glucosa baja a menos de 100, asegrate de comer un refrigerio para prevenir la hipoglucemia. Intenta ser consciente de los [employer] has notado que aumentan la glucosa, como las judas verdes, las tortillas y margart parry, y trata de comer porciones ms pequeas.  Cambios en la medicacin: - Disminuir la dosis de insulina basal Basaglar  (insulina glargina) de 24 a 20 unidades diarias. - Iniciar insulina Humalog (insulina lispro) a la hora de comer, 4 unidades antes de la comida ms importante del [employer] trader. Continuar con el resto de la medicacin.  Sigue con la buena alimentacin y el ejercicio. Intenta llevar una dieta rica en verduras, frutas y carnes magras (pollo, pavo, pescado). Intenta limitar el consumo de sal comiendo verduras frescas o congeladas (en lugar de enlatadas). Enjuaga las verduras enlatadas antes de cocinarlas y no aadas sal a las comidas.  It was nice to see you today! Your goal blood sugar is 80-130 before eating and less than 180 after eating. If you notice your glucose drops to less than 100, make sure to eat a snack to prevent hypoglycemia Try to be mindful about the foods that you have noticed increase glucose such as green beans, tortillas, rice and try to eat smaller portions.  Medication Changes: -Decrease dose of basal insulin  Basaglar  (insulin  glargine) from 24 to 20 units daily -Initiate mealtime insulin  Humalog (insulin  lispro) at 4 units before largest meal of the day Continue all other medication the same.   Keep up the good work with diet and exercise. Aim for a diet full of vegetables, fruit and lean meats (chicken, mal[place], fish). Try to limit salt intake by eating fresh or frozen vegetables (instead of canned), rinse canned vegetables prior to cooking and do not add any additional salt to meals.

## 2024-07-03 NOTE — Progress Notes (Signed)
 S:     Chief Complaint  Patient presents with   Medication Management    Diabetes management and CGM review   43 y.o. female who presents for diabetes evaluation, education, and management. Patient arrives in  good spirits and presents without  any assistance. Patient is accompanied by daughter.   Patient was referred and last seen by Primary Care Provider, Dr. Adele, on 06/24/2024.  At last visit, she was referred for glucose control due to multiple recent hospitalizations.   PMH is significant for T2DM, UTI, hyperlipidemia.  Patient reports Diabetes was diagnosed in 2007.   Current diabetes medications include: Basaglar  (insulin  glargine) 24 units daily  Current hyperlipidemia medications include: none  Patient reports adherence to taking all medications as prescribed.   Do you feel that your medications are working for you? yes Have you been experiencing any side effects to the medications prescribed? no Do you have any problems obtaining medications due to transportation or finances? no Insurance coverage: none  Patient reports hypoglycemic events and she reports lowest reading she saw was 37 and gets shaky with hypoglycemic events  Patient denies nocturia (nighttime urination).  Patient reports neuropathy (nerve pain). Patient reports visual changes. Patient denies self foot exams.   Patient reported dietary habits: Eats 2-3 meals/day with largest meal usually at night  O:  Review of Systems  Constitutional:  Positive for chills.  All other systems reviewed and are negative.  Physical Exam Vitals reviewed.  Constitutional:      Appearance: Normal appearance.  Neurological:     Mental Status: She is alert.  Psychiatric:        Mood and Affect: Mood normal.        Behavior: Behavior normal.        Thought Content: Thought content normal.        Judgment: Judgment normal.    Dexcom G7 CGM Download today 06/20/24-07/03/24 % Time CGM is active: 95.3% Average  Glucose: 263 mg/dL Glucose Management Indicator: 9.6  Glucose Variability: 35.4% (goal <36%) Time in Goal:  - Time in range 70-180: 23% - Time above range: 77% - Time below range: 0% Observed patterns: few hypoglycemic events occurring in afternoon. Patient reports that she eats most of her meals later in the day and takes insulin  at 10am   Lab Results  Component Value Date   HGBA1C 8.8 (H) 05/30/2024   Vitals:   07/03/24 0838  BP: 119/72  Pulse: 86  SpO2: 100%    Lipid Panel     Component Value Date/Time   CHOL 149 06/10/2024 1558   TRIG 132 06/10/2024 1558   HDL 64 06/10/2024 1558   CHOLHDL 2.3 06/10/2024 1558   CHOLHDL 3.3 Ratio 12/01/2008 2119   VLDL 32 12/01/2008 2119   LDLCALC 62 06/10/2024 1558    Clinical Atherosclerotic Cardiovascular Disease (ASCVD): No  The 10-year ASCVD risk score (Arnett DK, et al., 2019) is: 0.6%   Values used to calculate the score:     Age: 31 years     Clincally relevant sex: Female     Is Non-Hispanic African American: No     Diabetic: Yes     Tobacco smoker: No     Systolic Blood Pressure: 119 mmHg     Is BP treated: No     HDL Cholesterol: 64 mg/dL     Total Cholesterol: 149 mg/dL   A/P: Diabetes longstanding since 2007 currently progressing towards control. Patient is able to verbalize appropriate hypoglycemia management  plan. Medication adherence appears good. Patient reports hypoglycemic events at lunch time when she doesn't eat and feels shaky. -Decrease dose of basal insulin  Basaglar  (insulin  glargine) from 24 to 20 units daily -Initiate mealtime insulin  Humalog (insulin  lispro) at 4 units before largest meal of the day -Patient educated on purpose, proper use, and potential adverse effects.  -Counseled patient on eating a meal/snack if glucose is <100 to avoid hypoglycemic events and being mindful of meals/foods that increase glucose.  -Extensively discussed pathophysiology of diabetes, recommended lifestyle interventions,  dietary effects on blood sugar control.  -Counseled on s/sx of and management of hypoglycemia.   ASCVD risk - primary  prevention in patient with diabetes. Last LDL is 62 at goal of <70  mg/dL. ASCVD risk factors include diabetes. Do not plan to initiate statin today.  Written patient instructions provided. Patient verbalized understanding of treatment plan.  Total time in face to face counseling 37 minutes.    Follow-up:  Pharmacist 07/24/24 PCP clinic visit in 07/11/24 with Dr. Adele Patient seen with Lawson Mao, PharmD Candidate - PY3 student and Belvie Macintosh, PharmD - PY4 Candidate.

## 2024-07-03 NOTE — Assessment & Plan Note (Signed)
 Diabetes longstanding since 2007 currently progressing towards control. Patient is able to verbalize appropriate hypoglycemia management plan. Medication adherence appears good. Patient reports hypoglycemic events at lunch time when she doesn't eat and feels shaky. -Decrease dose of basal insulin  Basaglar  (insulin  glargine) from 24 to 20 units daily -Initiate mealtime insulin  Humalog (insulin  lispro) at 4 units before largest meal of the day -Patient educated on purpose, proper use, and potential adverse effects.  -Counseled patient on eating a meal/snack if glucose is <100 to avoid hypoglycemic events and being mindful of meals/foods that increase glucose.  -Extensively discussed pathophysiology of diabetes, recommended lifestyle interventions, dietary effects on blood sugar control.  -Counseled on s/sx of and management of hypoglycemia.

## 2024-07-04 NOTE — Progress Notes (Signed)
 Reviewed and agree with Dr Rennis plan.

## 2024-07-11 ENCOUNTER — Ambulatory Visit: Payer: Self-pay | Admitting: Family Medicine

## 2024-07-11 ENCOUNTER — Encounter: Payer: Self-pay | Admitting: Family Medicine

## 2024-07-11 VITALS — BP 113/66 | HR 98 | Wt 115.6 lb

## 2024-07-11 DIAGNOSIS — R300 Dysuria: Secondary | ICD-10-CM

## 2024-07-11 DIAGNOSIS — Z1231 Encounter for screening mammogram for malignant neoplasm of breast: Secondary | ICD-10-CM

## 2024-07-11 DIAGNOSIS — Z1159 Encounter for screening for other viral diseases: Secondary | ICD-10-CM

## 2024-07-11 LAB — POCT URINALYSIS DIP (MANUAL ENTRY)
Bilirubin, UA: NEGATIVE
Glucose, UA: NEGATIVE mg/dL
Ketones, POC UA: NEGATIVE mg/dL
Nitrite, UA: POSITIVE — AB
Protein Ur, POC: >=300 mg/dL — AB
Spec Grav, UA: 1.02 (ref 1.010–1.025)
Urobilinogen, UA: 0.2 U/dL
pH, UA: 5.5 (ref 5.0–8.0)

## 2024-07-11 LAB — POCT UA - MICROSCOPIC ONLY: WBC, Ur, HPF, POC: >20 (ref 0–5)

## 2024-07-11 MED ORDER — CEPHALEXIN 500 MG PO CAPS: 500.0000 mg | ORAL_CAPSULE | Freq: Two times a day (BID) | ORAL | 0 refills | Status: DC

## 2024-07-11 NOTE — Patient Instructions (Addendum)
 Thank you for coming in today! Here is a summary of what we discussed:  -Please go to [facility] across the street to have the lab completed  -You need a mammogram to prevent breast cancer.  Please schedule an appointment.  You can call [phone].      -Please remember to followup with us /your GYN for your pap smear    -You should be hearing from the urologists to schedule an appointment  Please schedule a follow up appointment in about 1 month so we can do your pap test (cervical cancer screen)  We are checking some labs today. If they are abnormal, I will call you. If they are normal, I will send you a MyChart message (if it is active) or a letter in the mail. If you do not hear about your labs in the next 2 weeks, please call the office.   Please call the clinic at [phone] if your symptoms worsen or you have any concerns.  Best, Dr Adele

## 2024-07-11 NOTE — Progress Notes (Signed)
    SUBJECTIVE:   Chief compliant/HPI: annual examination  Shelby Brown is a 43 y.o. who presents today for an annual exam.   Blood glucose - average has been 220s in the last week/2 weeks. Feels less dizzy. Has been doing long acting insulin  and meal time once daily  History tabs reviewed and updated.   Review of systems form reviewed and notable for:  Urinary sx- odor and pain.  Denies vaginal discharge, itching.  Has had many urinary tract infections and pyelonephritis in the past.  Last pyelo treated with ceftriaxone  and cefadroxil , urine culture showed E. coli in the past, sensitive to everything except Cipro  OBJECTIVE:   BP 113/66   Pulse 98   Wt 115 lb 9.6 oz (52.4 kg)   SpO2 99%   BMI 21.14 kg/m   - General: No acute distress. Awake and conversant. - Eyes: Normal conjunctiva, anicteric. Round symmetric pupils. - ENT: Hearing grossly intact. No nasal discharge. - Neck: Neck is supple. No masses or thyromegaly. - Respiratory: Respirations are non-labored. - Skin: No visible rashes or ulcers. - Psych: Alert and oriented. Cooperative, Appropriate mood and affect, Normal judgment. - MSK: Normal ambulation. - Neuro: Sensation and CN II-XII grossly normal.   ASSESSMENT/PLAN:   Assessment & Plan Need for hepatitis B screening test No phlebotomist today.  Advised to going to [facility] for blood draw. - Hepatitis B surface antigen; Future Dysuria Multiple episodes of UTI and pyelo in the past.  UA positive for nitrites and leukocytes.  Will treat with 10-day course of Keflex .  Will contact if urine culture indicates need for different antibiotic. - Urine Culture - cephALEXin  (KEFLEX ) 500 MG capsule; Take 1 capsule (500 mg total) by mouth 2 (two) times daily.  Dispense: 20 capsule; Refill: 0 Annual Examination  See AVS for age appropriate recommendations.   PHQ score 14, reviewed and discussed.  Not at all for question 9. Blood pressure reviewed and at goal .   Asked about intimate partner violence and resources given as appropriate  The patient currently uses tubal ligation for contraception.   Considered the following items based upon USPSTF recommendations: Diabetes screening: diabetic HIV testing:recently completed and result reviewed, normal  negative April 2025 Hepatitis C: recently completed and result reviewed, normal  negative may 2025 Hepatitis B:ordered Syphilis if at high risk: recently completed and result reviewed, normal  negative may 2025 GC/CT not at high risk and not ordered. Lipid panel (nonfasting or fasting) discussed based upon AHA recommendations and recently completed and repeat not yet indicated.  Consider repeat every 4-6 years.  Reviewed risk factors for latent tuberculosis and not indicated   Discussed family history, BRCA testing not indicated. No family history Cervical cancer screening: Due for Pap, advised scheduling follow-up appointment to complete this Breast cancer screening: ordered  Colorectal cancer screening: not applicable given age.  if age 96 or over.   Follow up in 1 year or sooner if indicated.  MyChart Activation: Needs assistance, can address at next visit  Rea Raring, MD [facility]

## 2024-07-15 LAB — URINE CULTURE

## 2024-07-22 ENCOUNTER — Other Ambulatory Visit: Payer: Self-pay | Admitting: Family Medicine

## 2024-07-22 DIAGNOSIS — N12 Tubulo-interstitial nephritis, not specified as acute or chronic: Secondary | ICD-10-CM

## 2024-07-22 DIAGNOSIS — F332 Major depressive disorder, recurrent severe without psychotic features: Secondary | ICD-10-CM

## 2024-07-22 DIAGNOSIS — Z794 Long term (current) use of insulin: Secondary | ICD-10-CM

## 2024-07-22 DIAGNOSIS — N10 Acute tubulo-interstitial nephritis: Secondary | ICD-10-CM

## 2024-07-22 DIAGNOSIS — E440 Moderate protein-calorie malnutrition: Secondary | ICD-10-CM

## 2024-07-24 ENCOUNTER — Encounter: Payer: Self-pay | Admitting: Pharmacist

## 2024-07-24 ENCOUNTER — Other Ambulatory Visit: Payer: Self-pay

## 2024-07-24 ENCOUNTER — Encounter (HOSPITAL_COMMUNITY): Payer: Self-pay

## 2024-07-24 ENCOUNTER — Inpatient Hospital Stay (HOSPITAL_COMMUNITY): Payer: Self-pay

## 2024-07-24 ENCOUNTER — Observation Stay (HOSPITAL_COMMUNITY)
Admission: AD | Admit: 2024-07-24 | Discharge: 2024-07-26 | Disposition: A | Discharge status: Home / Self Care | Payer: Self-pay | Source: Ambulatory Visit | Attending: Family Medicine | Admitting: Family Medicine

## 2024-07-24 ENCOUNTER — Ambulatory Visit (INDEPENDENT_AMBULATORY_CARE_PROVIDER_SITE_OTHER): Payer: Self-pay | Admitting: Family Medicine

## 2024-07-24 ENCOUNTER — Ambulatory Visit (INDEPENDENT_AMBULATORY_CARE_PROVIDER_SITE_OTHER): Payer: Self-pay | Admitting: Pharmacist

## 2024-07-24 VITALS — BP 104/68 | HR 94 | Temp 98.1°F | Ht 62.0 in

## 2024-07-24 VITALS — BP 120/78 | HR 104 | Wt 116.2 lb

## 2024-07-24 DIAGNOSIS — Z794 Long term (current) use of insulin: Secondary | ICD-10-CM

## 2024-07-24 DIAGNOSIS — N10 Acute tubulo-interstitial nephritis: Principal | ICD-10-CM | POA: Insufficient documentation

## 2024-07-24 DIAGNOSIS — N3000 Acute cystitis without hematuria: Secondary | ICD-10-CM | POA: Insufficient documentation

## 2024-07-24 DIAGNOSIS — B3731 Acute candidiasis of vulva and vagina: Secondary | ICD-10-CM | POA: Insufficient documentation

## 2024-07-24 DIAGNOSIS — Z789 Other specified health status: Secondary | ICD-10-CM

## 2024-07-24 DIAGNOSIS — N12 Tubulo-interstitial nephritis, not specified as acute or chronic: Secondary | ICD-10-CM | POA: Diagnosis present

## 2024-07-24 DIAGNOSIS — N898 Other specified noninflammatory disorders of vagina: Secondary | ICD-10-CM

## 2024-07-24 DIAGNOSIS — E1165 Type 2 diabetes mellitus with hyperglycemia: Secondary | ICD-10-CM

## 2024-07-24 DIAGNOSIS — E119 Type 2 diabetes mellitus without complications: Secondary | ICD-10-CM | POA: Insufficient documentation

## 2024-07-24 DIAGNOSIS — R300 Dysuria: Secondary | ICD-10-CM

## 2024-07-24 DIAGNOSIS — R1032 Left lower quadrant pain: Principal | ICD-10-CM

## 2024-07-24 LAB — PROTIME-INR
INR: 1 (ref 0.8–1.2)
Prothrombin Time: 13.6 s (ref 11.4–15.2)

## 2024-07-24 LAB — CBC
HCT: 31.9 % — ABNORMAL LOW (ref 36.0–46.0)
Hemoglobin: 10 g/dL — ABNORMAL LOW (ref 12.0–15.0)
MCH: 26.2 pg (ref 26.0–34.0)
MCHC: 31.3 g/dL (ref 30.0–36.0)
MCV: 83.5 fL (ref 80.0–100.0)
Platelets: 569 K/uL — ABNORMAL HIGH (ref 150–400)
RBC: 3.82 MIL/uL — ABNORMAL LOW (ref 3.87–5.11)
RDW: 14.1 % (ref 11.5–15.5)
WBC: 8 K/uL (ref 4.0–10.5)
nRBC: 0 % (ref 0.0–0.2)

## 2024-07-24 LAB — COMPREHENSIVE METABOLIC PANEL WITH GFR
ALT: 19 U/L (ref 0–44)
AST: 20 U/L (ref 15–41)
Albumin: 3.4 g/dL — ABNORMAL LOW (ref 3.5–5.0)
Alkaline Phosphatase: 76 U/L (ref 38–126)
Anion gap: 12 (ref 5–15)
BUN: 22 mg/dL — ABNORMAL HIGH (ref 6–20)
CO2: 24 mmol/L (ref 22–32)
Calcium: 9 mg/dL (ref 8.9–10.3)
Chloride: 102 mmol/L (ref 98–111)
Creatinine, Ser: 1 mg/dL (ref 0.44–1.00)
GFR, Estimated: >60 mL/min (ref >60)
Glucose, Bld: 148 mg/dL — ABNORMAL HIGH (ref 70–99)
Potassium: 4 mmol/L (ref 3.5–5.1)
Sodium: 138 mmol/L (ref 135–145)
Total Bilirubin: 0.4 mg/dL (ref 0.0–1.2)
Total Protein: 8 g/dL (ref 6.5–8.1)

## 2024-07-24 LAB — URINALYSIS, W/ REFLEX TO CULTURE (INFECTION SUSPECTED)
Bilirubin Urine: NEGATIVE
Glucose, UA: NEGATIVE mg/dL
Ketones, ur: NEGATIVE mg/dL
Nitrite: NEGATIVE
Protein, ur: 100 mg/dL — AB
Specific Gravity, Urine: 1.012 (ref 1.005–1.030)
pH: 7 (ref 5.0–8.0)

## 2024-07-24 LAB — POCT URINE DIPSTICK
Bilirubin, UA: NEGATIVE
Glucose, UA: NEGATIVE mg/dL
Ketones, POC UA: NEGATIVE mg/dL
Nitrite, UA: NEGATIVE
POC PROTEIN,UA: 30 — AB
Spec Grav, UA: 1.01 (ref 1.010–1.025)
Urobilinogen, UA: 0.2 U/dL
pH, UA: 5.5 (ref 5.0–8.0)

## 2024-07-24 LAB — POCT UA - MICROSCOPIC ONLY

## 2024-07-24 LAB — PREGNANCY, URINE: Preg Test, Ur: NEGATIVE

## 2024-07-24 LAB — LACTIC ACID, PLASMA
Lactic Acid, Venous: 1 mmol/L (ref 0.5–1.9)
Lactic Acid, Venous: 0.8 mmol/L (ref 0.5–1.9)

## 2024-07-24 LAB — POCT WET PREP (WET MOUNT)
Clue Cells Wet Prep Whiff POC: NEGATIVE
Trichomonas Wet Prep HPF POC: ABSENT

## 2024-07-24 LAB — HEPATITIS B SURFACE ANTIGEN: Hepatitis B Surface Ag: NONREACTIVE

## 2024-07-24 LAB — GLUCOSE, CAPILLARY
Glucose-Capillary: 153 mg/dL — ABNORMAL HIGH (ref 70–99)
Glucose-Capillary: 144 mg/dL — ABNORMAL HIGH (ref 70–99)

## 2024-07-24 LAB — LIPASE, BLOOD: Lipase: 36 U/L (ref 11–51)

## 2024-07-24 MED ORDER — BASAGLAR KWIKPEN 100 UNIT/ML ~~LOC~~ SOPN: 18.0000 [IU] | PEN_INJECTOR | Freq: Every day | SUBCUTANEOUS | Status: DC

## 2024-07-24 MED ORDER — GABAPENTIN 100 MG PO CAPS: 100.0000 mg | ORAL_CAPSULE | Freq: Three times a day (TID) | ORAL | 3 refills | Status: DC

## 2024-07-24 MED ORDER — ACETAMINOPHEN 500 MG PO TABS
1000.0000 mg | ORAL_TABLET | Freq: Four times a day (QID) | ORAL | Status: DC | PRN
  Administered 2024-07-26: 1000 mg via ORAL
  Filled 2024-07-24: qty 2

## 2024-07-24 MED ORDER — INSULIN LISPRO (1 UNIT DIAL) 100 UNIT/ML (KWIKPEN): 6.0000 [IU] | PEN_INJECTOR | Freq: Every day | SUBCUTANEOUS | Status: DC

## 2024-07-24 MED ORDER — SODIUM CHLORIDE 0.9 % IV SOLN
1.0000 g | INTRAVENOUS | Status: DC
  Administered 2024-07-24: 1 g via INTRAVENOUS
  Filled 2024-07-24: qty 10

## 2024-07-24 MED ORDER — IOHEXOL 350 MG/ML SOLN
75.0000 mL | Freq: Once | INTRAVENOUS | Status: AC | PRN
  Administered 2024-07-24: 75 mL via INTRAVENOUS

## 2024-07-24 MED ORDER — FLUCONAZOLE 150 MG PO TABS: 150.0000 mg | ORAL_TABLET | Freq: Once | ORAL | 0 refills | Status: DC

## 2024-07-24 MED ORDER — INSULIN ASPART 100 UNIT/ML IJ SOLN
0.0000 [IU] | Freq: Every day | INTRAMUSCULAR | Status: DC
  Administered 2024-07-25: 2 [IU] via SUBCUTANEOUS
  Filled 2024-07-24: qty 2

## 2024-07-24 MED ORDER — INSULIN ASPART 100 UNIT/ML IJ SOLN
0.0000 [IU] | Freq: Three times a day (TID) | INTRAMUSCULAR | Status: DC
  Administered 2024-07-25: 2 [IU] via SUBCUTANEOUS
  Administered 2024-07-25: 5 [IU] via SUBCUTANEOUS
  Administered 2024-07-26: 2 [IU] via SUBCUTANEOUS
  Administered 2024-07-26: 3 [IU] via SUBCUTANEOUS
  Filled 2024-07-24: qty 3
  Filled 2024-07-24: qty 2
  Filled 2024-07-24: qty 3
  Filled 2024-07-24: qty 5

## 2024-07-24 NOTE — Assessment & Plan Note (Addendum)
-   Admit to FMTS, attending Dr. Krystal McDiarmid - MedSurg level of care, Vital signs per floor - Carb modified diet  - Antibiotics: CTX (11/6- ), to order s/p blood culture collection  - Pain: Tylenol  1000 mg every 6 hours as needed - Labs: - Blood cultures pending - Urine culture pending - Urinalysis, CBC, lipase, lactic acid, protime-INR, Hep B, CMP ordered for today - AM CBC, CMP - Pending blood work (to check creatinine function), will order CTAP with contrast - Add on testing for gonorrhea and chlamydia

## 2024-07-24 NOTE — H&P (Cosign Needed Addendum)
 Hospital Admission History and Physical Service Pager: [phone]  Patient name: Nataly Pacifico Medical record number: [id number] Date of Birth: Mar 04, 1981 Age: 43 y.o. Gender: female  Primary Care Provider: Adele Song, MD Consultants: None Code Status: Full code which was confirmed with family if patient unable to confirm   Preferred Emergency Contact:  Contact Information     Name Relation Home Work Mobile   [name],[name] Spouse [phone]  [phone]   R,[name] Daughter [phone]     [name] Knee Daughter   [phone]   At patient's request, her daughter acted as interpreter throughout the entirety of the visit.  Chief Complaint: LLQ   Differential and Medical Decision Making:  Bless Lisenby is a 43 y.o. female with PMH of T2DM, HLD, MDD, recurrent UTI and pyelonephritis presenting with pyelonephritis.   Patient is a direct admit from [facility] clinic.  Previously found to have UTI 07/11/24 and treated with 10-day course of Keflex ; was adherent and urine culture sensitive to Keflex .  Concern now for pyelonephritis and requiring IV antibiotics, hence admission.  Patient reports subjectively feeling similar to prior pyelonephritis episode, though reassuringly vital signs are normal and without significant physical exam findings.  Wet prep done in clinic showing yeast and KOH. Assessment & Plan Pyelonephritis Left lower quadrant pain - Admit to FMTS, attending Dr. Krystal McDiarmid - MedSurg level of care, Vital signs per floor - Carb modified diet  - Antibiotics: CTX (11/6- ), to order s/p blood culture collection  - Pain: Tylenol  1000 mg every 6 hours as needed - Labs: - Blood cultures pending - Urine culture pending - Urinalysis, CBC, lipase, lactic acid, protime-INR, Hep B, CMP ordered for today - AM CBC, CMP - Pending blood work (to check creatinine function), will order CTAP with contrast - Add on testing for gonorrhea and chlamydia  Yeast  infection of the vagina - Pending blood work, will give Diflucan 150 mg once Chronic health problem T2DM - Takes 18 units glargine and 6 units Humalog with dinner. Keep on SSI while admitted. Add LAI as needed    FEN/GI: Carb modified VTE Prophylaxis: Pending labs  Disposition: MedSurg  History of Present Illness:  Gessica Jawad is a 43 y.o. female presenting with pyelonephritis.   Previously hospitalized 10/2-10/3/25 for pyelonephritis and treated with IV Rocephin  and p.o. cefadroxil  (10/2-10/8).  Then found to have UTI on 07/11/2024, which was treated with a 10-day course of Keflex  entheses urine culture sensitive to Keflex ).  Patient reports adherence to this medication.    Patient seen in [facility] clinic today 11/6 for left lower quadrant pain, bilateral back pain, chills.  Reports same to us  at time of admission interview.  Also reports burning, worsening lower back pain. Symptoms ongoing for the last week and a half.  Reports this feels similar to her prior episode of pyelonephritis in October, though does report more significant lower back pain with this episode.  Review Of Systems: Per HPI.  Pertinent Past Medical History: - T2DM - HLD - MDD - Recurrent UTI and pyelonephritis Remainder reviewed in history tab.   Pertinent Past Surgical History: - Bilateral tubal ligation Remainder reviewed in history tab.   Pertinent Social History: Tobacco use: No Alcohol use: No Other Substance use: None Lives with husband and two daughters  Pertinent Family History: Father - deceased, cancer Mother - alive, healthy Two sisters alive with diabetes Two brothers alive, healthy  Important Outpatient Medications: Famotidine  20mg  daily - not taking Iron 325 mg daily Gabapentin  100mg  TID  Basaglar  18 units Lidocaine  patches daily   Objective: BP 124/82 (BP Location: Right Arm)   Pulse 93   Temp 98.2 F (36.8 C)   Resp 19   LMP  (LMP Unknown)  Exam: General: Patient  lying back in bed, no acute distress. Eyes: EOMI bilaterally.  Sclera nonicteric, conjunctiva noninjected. Neck: Supple Cardiovascular: Regular rate and rhythm, no murmurs/rubs/gallops. Respiratory: Normal work of breathing on room air. Clear to auscultation bilaterally; no wheezes, crackles. Gastrointestinal: Bowel sounds present and normoactive bilaterally. Soft, nondistended.  Tenderness to palpation of lower abdomen, with more tenderness in LLQ.  No rebound, no guarding.  Mild left-sided CVA tenderness. MSK: Grossly normal upper and lower extremities.  No bilateral lower extremity edema. Derm: Skin warm, dry.  No rash seen of visible skin. Neuro: Alert and oriented to person, place, time, event.  Appropriately responding to questions. Psych: Appropriate affect.  Labs:  CBC BMET  No results for input(s): WBC, HGB, HCT, PLT in the last 168 hours. No results for input(s): NA, K, CL, CO2, BUN, CREATININE, GLUCOSE, CALCIUM in the last 168 hours.    Larraine Palma, MD 07/24/2024, 5:51 PM PGY-1, [facility]  FPTS Intern pager: [phone], text pages welcome Secure chat group [facility] Teaching Service   I have reviewed the above note, agree with its content, and have made the appropriate changes.   Damien Pinal, DO [facility], PGY-3

## 2024-07-24 NOTE — Patient Instructions (Addendum)
 Please answer any calls that you get today as it may be the hospital calling about your room.  If you develop any fever, chills, worsening pain, vomiting go to the emergency room.  Responda cualquier llamada que reciba hoy, ya que puede ser del hospital llamando sobre su habitacin.  Si presenta fiebre, escalofros, dolor que empeora o vmitos, vaya a la sala de emergencias.

## 2024-07-24 NOTE — Progress Notes (Signed)
 Patient arrived to room. Alert and oriented x4. VSS. Daughter interpreted with the permission of mother.

## 2024-07-24 NOTE — Plan of Care (Signed)
 FMTS Brief Progress Note  S:Went to evaluate patient. Patient sleeping, normal work of breathing. Did not wake.  O: BP 124/82 (BP Location: Right Arm)   Pulse 93   Temp 98.2 F (36.8 C)   Resp 19   LMP  (LMP Unknown)    Gen: NAD, sleeping Resp: Normal WOB on RA  A/P: - UPT ordered for CT - Continue plan per day team H&P - Orders reviewed. Labs for AM ordered, which was adjusted as needed.  - If condition changes, plan includes page family medicine teaching service.   Howell Lunger, DO 07/24/2024, 7:37 PM PGY-3, [facility] Night Resident  Please page [phone] with questions.

## 2024-07-24 NOTE — Plan of Care (Signed)
   Problem: Education: Goal: Knowledge of General Education information will improve Description Including pain rating scale, medication(s)/side effects and non-pharmacologic comfort measures Outcome: Progressing   Problem: Health Behavior/Discharge Planning: Goal: Ability to manage health-related needs will improve Outcome: Progressing

## 2024-07-24 NOTE — Hospital Course (Addendum)
 Shelby Brown is a 43 y.o. year old with a history of T2DM, HLD, MDD, recurrent UTI/pyelonephritis who was a direct admit from clinic for pyelonephritis workup and was admitted to the [facility] Medicine Teaching Service.  Pyelonephritis Uncontrolled T2DM Recurrent pyelonephritis with multiple admissions in recent months in the setting of poorly controlled T2DM.  Found to have UTI 07/11/2024, treated with 10-day Keflex  course.  Presented to clinic 11/6 with left lower quadrant pain, back pain, chills with symptoms similar to prior episode of pyelonephritis and had been off her insulin  x 3 weeks.  Direct admit to the hospital given concern over pyelonephritis with failed outpatient antibiotic regimen.  CTAP with contrast had findings suggestive of bilateral pyelonephritis and cystitis.  Treated with IV ceftriaxone  (11/6-11/8). Urine culture negative but pending final result.  Transition to cefuroxime  500 mg twice daily to complete 7-day total course.  Discharged on Basaglar  12 units with instruction to titrate up by 2 units for every fasting CBG >180.  Yeast infection Patient with symptoms of vaginal discharge and itching.  Wet prep done 11/6 with evidence of yeast infection.  Patient treated with Diflucan 150 mg once on 11/7. Symptoms resolved following.  Other chronic conditions were medically managed with home medications and formulary alternatives as necessary (T2DM).  PCP Follow-up Recommendations: Reassess vaginal itching/pain; consider if STI needed (GC/Chlamydia testing) Discharged on LAI 12 units, titrate as needed for glycemic control Consider OP Urology f/u given recurrent pyelonephritis, may need suppressive antibiotics

## 2024-07-24 NOTE — Progress Notes (Signed)
 Explained to patient that she's going for CT of abdomen/pelvis via Spanish interpreter,Xochitl [phone]. Patient verbalized understanding.

## 2024-07-24 NOTE — Patient Instructions (Addendum)
 It was nice to see you today!  Your goal blood sugar is 80-130 before eating and less than 180 after eating.  Medication Changes: Decrease long acting Basaglar  (insulin  glargine) to 18 units in the morning Increase meal time insulin  Humalog (insulin  lispro) to 6 units before meal Continue all other medication the same.   Keep up the good work with diet and exercise. Aim for a diet full of vegetables, fruit and lean meats (chicken, turkey, fish). Try to limit salt intake by eating fresh or frozen vegetables (instead of canned), rinse canned vegetables prior to cooking and do not add any additional salt to meals.

## 2024-07-24 NOTE — Assessment & Plan Note (Addendum)
 T2DM - Takes 18 units glargine and 6 units Humalog with dinner. Keep on SSI while admitted. Add LAI as needed

## 2024-07-24 NOTE — Assessment & Plan Note (Addendum)
-   Pending blood work, will give Diflucan 150 mg once

## 2024-07-24 NOTE — Progress Notes (Signed)
 S:     Chief Complaint  Patient presents with   Medication Management    Diabetes management   43 y.o. female who presents for diabetes evaluation, education, and management. Patient arrives in  good spirits and presents without  any assistance. Patient is accompanied by daughter and interpreter, Therisa.   Patient was referred and last seen by Primary Care Provider, Dr. Adele, on 07/11/24.  At last visit, patient had UTI and was prescribed Cephalexin . At last pharmacy visit, patient was started on Humalog (insulin  lispro) 4 units before dinner.   PMH is significant for DM, hyperlipidemia.  Patient reports Diabetes was diagnosed in 2007.   Current diabetes medications include: Basaglar  (insulin  glargine) 20 units and Humalog (insulin  lispro) 4 units  Patient reports adherence to taking all medications as prescribed.   Do you feel that your medications are working for you? yes Have you been experiencing any side effects to the medications prescribed? yes Do you have any problems obtaining medications due to transportation or finances? no  Patient reports hypoglycemic events every day in the evening before dinner.  Patient denies nocturia (nighttime urination).  Patient reports neuropathy (nerve pain). Patient reports visual changes. Patient denies self foot exams.   Patient reported dietary habits: Eats 2 meals/day  O:   Review of Systems  Genitourinary:  Positive for dysuria and frequency.  Neurological:  Positive for tingling.  All other systems reviewed and are negative.   Physical Exam Vitals reviewed.  Constitutional:      Appearance: Normal appearance.  Pulmonary:     Effort: Pulmonary effort is normal.  Neurological:     Mental Status: She is alert.  Psychiatric:        Mood and Affect: Mood normal.        Behavior: Behavior normal.        Thought Content: Thought content normal.        Judgment: Judgment normal.    Dexcom CGM Download today  07/11/24-07/24/24 % Time CGM is active: 99.7% Average Glucose: 222 mg/dL Glucose Management Indicator: 8.6  Glucose Variability: 40.2% (goal <36%) Time in Goal:  - Time in range 70-180: 38% - Time above range: 61% - Time below range: 1% Observed patterns: Mid day lows with spikes at night after snacking  Lab Results  Component Value Date   HGBA1C 8.8 (H) 05/30/2024   Vitals:   07/24/24 0848  BP: 120/78  Pulse: (!) 104  SpO2: 92%    Lipid Panel     Component Value Date/Time   CHOL 149 06/10/2024 1558   TRIG 132 06/10/2024 1558   HDL 64 06/10/2024 1558   CHOLHDL 2.3 06/10/2024 1558   CHOLHDL 3.3 Ratio 12/01/2008 2119   VLDL 32 12/01/2008 2119   LDLCALC 62 06/10/2024 1558    Clinical Atherosclerotic Cardiovascular Disease (ASCVD): No  The 10-year ASCVD risk score (Arnett DK, et al., 2019) is: 0.6%   Values used to calculate the score:     Age: 2 years     Clincally relevant sex: Female     Is Non-Hispanic African American: No     Diabetic: Yes     Tobacco smoker: No     Systolic Blood Pressure: 120 mmHg     Is BP treated: No     HDL Cholesterol: 64 mg/dL     Total Cholesterol: 149 mg/dL   A/P: Diabetes longstanding since 2007 currently uncontrolled with GMI of 8.6 and average . Patient is  able to verbalize  appropriate hypoglycemia management plan. Medication adherence appears good. Control is suboptimal due to suboptimal regimen. Patient reports that she has meals later in the day and has lows prior to eating dinner as she does not usually eat lunch. Also complains of neuropathy and blurry vision. -Decreased dose of basal insulin  Basaglar  (insulin  glargine) from 20 units to 18 units -Increased dose of rapid insulin  Humalog (insulin  lispro) from 4 to 6 units.  -Patient educated on purpose, proper use, and potential adverse effects of.  -Extensively discussed pathophysiology of diabetes, recommended lifestyle interventions, dietary effects on blood sugar control.   -Counseled on s/sx of and management of hypoglycemia. -Refill of Gabapentin  100mg  TID sent to pharmacy  Written patient instructions provided. Patient verbalized understanding of treatment plan.  Total time in face to face counseling 26 minutes.    Follow-up:  Pharmacist 08/07/24 Patient seen with Lawson Mao, PharmD Candidate - PY3 student and [employer] PharmD - PY4 Candidate.

## 2024-07-24 NOTE — Assessment & Plan Note (Signed)
 Diabetes longstanding since 2007 currently uncontrolled with GMI of 8.6 and average . Patient is  able to verbalize appropriate hypoglycemia management plan. Medication adherence appears good. Control is suboptimal due to suboptimal regimen. Patient reports that she has meals later in the day and has lows prior to eating dinner as she does not usually eat lunch. Also complains of neuropathy and blurry vision. -Decreased dose of basal insulin  Basaglar  (insulin  glargine) from 20 units to 18 units -Increased dose of rapid insulin  Humalog (insulin  lispro) from 4 to 6 units.  -Patient educated on purpose, proper use, and potential adverse effects of.  -Extensively discussed pathophysiology of diabetes, recommended lifestyle interventions, dietary effects on blood sugar control.  -Counseled on s/sx of and management of hypoglycemia. -Refill of Gabapentin  100mg  TID sent to pharmacy

## 2024-07-24 NOTE — Progress Notes (Signed)
    SUBJECTIVE:   CHIEF COMPLAINT / HPI:  In-person Spanish interpreter present  LLQ pain described as cramping, dysuria, urinary hesitancy  Bilateral back pain x 1.5 weeks  Chills x1 week Yellow vaginal discharge and vaginal itching  Not sexually active  Treated for UTI 07/11/24 with 10 day course of Keflex . Pt was adherent to entire antibiotic course.  Urine culture was sensitive to Keflex . States sx improved but never went away.   Admitted to the hospital 06/19/2024 for pyelonephritis, treated with IV Rocephin  then PO Cefadroxil  (10/2-10/8)  GMI today 8.6  PERTINENT  PMH / PSH: T2DM, HLD, MDD, recurrent UTI and pyelonephritis   OBJECTIVE:   BP 104/68   Pulse 94   Temp 98.1 F (36.7 C)   Ht 5' 2 (1.575 m)   LMP  (LMP Unknown)   SpO2 99%   BMI 21.25 kg/m   General: well appearing, NAD Cardiovascular: RRR, no m/r/g Respiratory: normal work of breathing on RA, CTAB Abdomen: Soft, non-distended. LLQ TTP.  Back: L CVA TTP Pelvic - Thersia Meissner, CMA present. VULVA: normal appearing vulva with no masses, tenderness or lesions  VAGINA: normal appearing vagina with normal color, no lesions, moderate white discharge present CERVIX: normal appearing cervix without lesions, no CMT  ASSESSMENT/PLAN:   Assessment & Plan Dysuria Patient with small leukocytes on UA, CVA tenderness, abdominal pain, chills concerning for pyelonephritis.  She recently completed 10-day course of Keflex  which her urine culture showed sensitivity to.  Concerned that she has failed formal outpatient antibiotic course and needs IV antibiotics.  Will initiate direct admission to the hospital for IV antibiotics for pyelonephritis.  Consider CT abdomen pelvis.  Signout given to admitting team.  ED precautions discussed with patient prior to bed availability. Vaginal discharge Vaginal yeast infection Wet prep with many yeast. Will send Diflucan     Naisha Wisdom, DO [facility]

## 2024-07-24 NOTE — Progress Notes (Signed)
 Attempted to contact pt regarding direct admit. No answer.

## 2024-07-25 ENCOUNTER — Telehealth: Payer: Self-pay

## 2024-07-25 DIAGNOSIS — Z794 Long term (current) use of insulin: Secondary | ICD-10-CM

## 2024-07-25 DIAGNOSIS — N12 Tubulo-interstitial nephritis, not specified as acute or chronic: Secondary | ICD-10-CM

## 2024-07-25 DIAGNOSIS — E119 Type 2 diabetes mellitus without complications: Secondary | ICD-10-CM

## 2024-07-25 LAB — CBC
HCT: 27.5 % — ABNORMAL LOW (ref 36.0–46.0)
HCT: 26.6 % — ABNORMAL LOW (ref 36.0–46.0)
Hemoglobin: 8.7 g/dL — ABNORMAL LOW (ref 12.0–15.0)
Hemoglobin: 8.6 g/dL — ABNORMAL LOW (ref 12.0–15.0)
MCH: 26.3 pg (ref 26.0–34.0)
MCH: 26.7 pg (ref 26.0–34.0)
MCHC: 31.6 g/dL (ref 30.0–36.0)
MCHC: 32.3 g/dL (ref 30.0–36.0)
MCV: 83.1 fL (ref 80.0–100.0)
MCV: 82.6 fL (ref 80.0–100.0)
Platelets: 478 K/uL — ABNORMAL HIGH (ref 150–400)
Platelets: 455 K/uL — ABNORMAL HIGH (ref 150–400)
RBC: 3.31 MIL/uL — ABNORMAL LOW (ref 3.87–5.11)
RBC: 3.22 MIL/uL — ABNORMAL LOW (ref 3.87–5.11)
RDW: 14.1 % (ref 11.5–15.5)
RDW: 14.3 % (ref 11.5–15.5)
WBC: 6.9 K/uL (ref 4.0–10.5)
WBC: 7.5 K/uL (ref 4.0–10.5)
nRBC: 0 % (ref 0.0–0.2)
nRBC: 0 % (ref 0.0–0.2)

## 2024-07-25 LAB — GLUCOSE, CAPILLARY
Glucose-Capillary: 103 mg/dL — ABNORMAL HIGH (ref 70–99)
Glucose-Capillary: 146 mg/dL — ABNORMAL HIGH (ref 70–99)
Glucose-Capillary: 201 mg/dL — ABNORMAL HIGH (ref 70–99)
Glucose-Capillary: 240 mg/dL — ABNORMAL HIGH (ref 70–99)
Glucose-Capillary: 97 mg/dL (ref 70–99)

## 2024-07-25 LAB — COMPREHENSIVE METABOLIC PANEL WITH GFR
ALT: 15 U/L (ref 0–44)
AST: 18 U/L (ref 15–41)
Albumin: 2.9 g/dL — ABNORMAL LOW (ref 3.5–5.0)
Alkaline Phosphatase: 63 U/L (ref 38–126)
Anion gap: 11 (ref 5–15)
BUN: 22 mg/dL — ABNORMAL HIGH (ref 6–20)
CO2: 24 mmol/L (ref 22–32)
Calcium: 8.7 mg/dL — ABNORMAL LOW (ref 8.9–10.3)
Chloride: 104 mmol/L (ref 98–111)
Creatinine, Ser: 1.05 mg/dL — ABNORMAL HIGH (ref 0.44–1.00)
GFR, Estimated: >60 mL/min (ref >60)
Glucose, Bld: 71 mg/dL (ref 70–99)
Potassium: 4 mmol/L (ref 3.5–5.1)
Sodium: 139 mmol/L (ref 135–145)
Total Bilirubin: 0.5 mg/dL (ref 0.0–1.2)
Total Protein: 6.7 g/dL (ref 6.5–8.1)

## 2024-07-25 LAB — HEMOGLOBIN A1C
Hgb A1c MFr Bld: 9.5 % — ABNORMAL HIGH (ref 4.8–5.6)
Mean Plasma Glucose: 225.95 mg/dL

## 2024-07-25 MED ORDER — ENOXAPARIN SODIUM 40 MG/0.4ML IJ SOSY
40.0000 mg | PREFILLED_SYRINGE | INTRAMUSCULAR | Status: DC
  Administered 2024-07-25 – 2024-07-26 (×2): 40 mg via SUBCUTANEOUS
  Filled 2024-07-25 (×2): qty 0.4

## 2024-07-25 MED ORDER — FLUCONAZOLE 150 MG PO TABS
150.0000 mg | ORAL_TABLET | Freq: Once | ORAL | Status: AC
  Administered 2024-07-25: 150 mg via ORAL
  Filled 2024-07-25: qty 1

## 2024-07-25 MED ORDER — SODIUM CHLORIDE 0.9 % IV SOLN
2.0000 g | INTRAVENOUS | Status: DC
  Administered 2024-07-25: 2 g via INTRAVENOUS
  Filled 2024-07-25: qty 20

## 2024-07-25 NOTE — Assessment & Plan Note (Signed)
-   Antibiotics: IV CTX (11/6- ) for 7 day course; increase to 2g today - Consider ID consult if uropathogen grows - Pain: Tylenol  1000 mg every 6 hours as needed - Labs: - Blood cultures with no growth < 12 hours - Urine culture pending - Lab [address]

## 2024-07-25 NOTE — TOC Initial Note (Signed)
 Transition of Care [facility]) - Initial/Assessment Note    Patient Details  Name: Shelby Brown MRN: [id number] Date of Birth: 09-05-1981  [facility] CM/SW Contact:    Lendia Dais, LCSWA Phone Number: 07/25/2024, 2:01 PM  Clinical Narrative: Pt is from home with family. Pt is independent with ADL's and reports she does not move as fast as she would like to. Pt has no DME, and has seen PCP Dr. Adele in the last year.  Pt states that she does not drive and her daughters provide transportation to appointments. Pt does not have a HCPOA and requested information to make her daughters HCPOA. CSW placed spiritual consult.   Pt states that she struggles to make ends meet and was agreeable to food pantry and housing resources. CSW put resources in the AVS. Pt does not have a source of income but daughters and husbands work.   CSW inquired about healthcare insurance due to none being on file. CSW asked if they could consult the financial navigators in the hospital to screen for medicaid. Pt was agreeable.  Pt declined MH/SU hx and stated that they feel sad or hopeless at times. CSW inquired if interested in any resources and pt declined.  No further TOC needs. Please place consult for further needs.              Expected Discharge Plan: Home/Self Care Barriers to Discharge: Inadequate or no insurance, Continued Medical Work up   Patient Goals and CMS Choice Patient states their goals for this hospitalization and ongoing recovery are:: Become more mobile   Choice offered to / list presented to : NA      Expected Discharge Plan and Services In-house Referral: Clinical Social Work     Living arrangements for the past 2 months: Single Family Home                                      Prior Living Arrangements/Services Living arrangements for the past 2 months: Single Family Home Lives with:: Adult Children, Minor Children, Spouse Patient language and need  for interpreter reviewed:: Yes Do you feel safe going back to the place where you live?: Yes      Need for Family Participation in Patient Care: No (Comment) Care giver support system in place?: No (comment)   Criminal Activity/Legal Involvement Pertinent to Current Situation/Hospitalization: No - Comment as needed  Activities of Daily Living   ADL Screening (condition at time of admission) Independently performs ADLs?: Yes (appropriate for developmental age) Is the patient deaf or have difficulty hearing?: No Does the patient have difficulty seeing, even when wearing glasses/contacts?: No Does the patient have difficulty concentrating, remembering, or making decisions?: No  Permission Sought/Granted Permission sought to share information with : Family Supports Permission granted to share information with : Yes, Verbal Permission Granted  Share Information with NAME: [name],Osie     Permission granted to share info w Relationship: Spouse  Permission granted to share info w Contact Information: [phone]  Emotional Assessment Appearance:: Appears stated age Attitude/Demeanor/Rapport: Engaged Affect (typically observed): Pleasant, Appropriate Orientation: : Oriented to Self, Oriented to Situation, Oriented to Place, Oriented to  Time Alcohol / Substance Use: Not Applicable Psych Involvement: No (comment)  Admission diagnosis:  Pyelonephritis [N12] Patient Active Problem List   Diagnosis Date Noted   Left lower quadrant pain 07/24/2024   Yeast infection of the vagina 07/24/2024  Left shoulder pain 06/20/2024   Pyelonephritis 06/19/2024   Malnutrition of moderate degree 05/30/2024   UTI (urinary tract infection) 05/29/2024   Normocytic anemia 05/29/2024   Diabetic polyneuropathy (HCC) 05/29/2024   Type 2 diabetes mellitus with insulin  therapy (HCC) 03/19/2024   Chronic health problem 03/13/2024   Hemianopia, homonymous, 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 [place] [address] Phone: [phone] Fax: [phone]  [facility] [address] [place] [address] Phone: [phone] Fax: [phone]  [facility] [address] [place] [address] Phone: [phone] Fax: [phone]  [facility] [address] [place] [address] Phone: [phone] Fax: [phone]     Social Drivers of Health (SDOH) Social History: SDOH Screenings   Food Insecurity: Food Insecurity Present (07/24/2024)  Housing: Low Risk  (07/24/2024)  Recent Concern: Housing - High Risk (05/30/2024)  Transportation Needs: Unmet Transportation Needs (07/24/2024)  Utilities: At Risk (07/24/2024)  Alcohol Screen: Low Risk  (11/01/2017)  Depression (PHQ2-9): High Risk (07/11/2024)  Tobacco Use: Low Risk  (07/24/2024)   SDOH Interventions:     Readmission Risk Interventions    12/28/2023    3:39 PM  Readmission Risk Prevention Plan  Post Dischage Appt Complete  Medication Screening Complete  Transportation Screening Complete

## 2024-07-25 NOTE — Assessment & Plan Note (Addendum)
 Anemia - Stable, no s/s of anemia on exam. No intervention indicated at this time; can follow outpatient. T2DM - A1c of 9.5%. Takes 18 units glargine and 6 units Humalog with dinner. Keep on SSI while admitted. Add LAI as needed. Consider adjusting home regiment at discharge.

## 2024-07-25 NOTE — Discharge Instructions (Addendum)
 Dear Shelby Brown,   Thank you for letting us  participate in your care! In this section, you will find a brief hospital admission summary of why you were admitted to the hospital, what happened during your admission, your diagnosis/diagnoses, and recommended follow up.   You were admitted for a kidney infection and received IV antibiotics and fluids.  Please continue to follow-up with your PCP to discuss management of your diabetes and recurrent kidney infections.  Please take 12 units of your long acting insulin  and increase by 2 units every day for fasting blood sugar above 180 until seen by PCP.  Please take the antibiotic twice per day until you run out of it.   POST-HOSPITAL & CARE INSTRUCTIONS We recommend following up with your PCP within 1 week from being discharged from the hospital. Please let PCP/Specialists know of any changes in medications that were made which you will be able to see in the medications section of this packet.  DOCTOR'S APPOINTMENTS & FOLLOW UP Future Appointments  Date Time Provider Department Center  07/29/2024 11:10 AM ACCESS TO CARE POOL [facility]  08/07/2024  9:00 AM Koval, Peter G, RPH-CPP [facility]     Thank you for choosing [facility]! Take care and be well!  Family Medicine Teaching Service Inpatient Team [place]  [facility]  [address] [place], [place] [address] [phone]    Ruthellen BRITAIN ROMUALDO DE [place] Direccin: [address] [place], [place] [address] Debroah sheerer telfono:  [phone] Horario de atencin:  Los residentes del condado de [place] pueden venir a buscar alimentos de lunes a viernes de 8:30 a. m. a 3:30 p. m. Se requiere identificacin con fotografa y tarjetas de Seguro Social para todos los residentes de [place]. Pueden venir seis veces al ao.  LA MESA BENDITA Direccin:  [address] [place], [place] [address] Nmero de telfono:   [phone] Horario de atencin:  funciona de martes a viernes de 10:00 a. m. a 1:00 p. m. Requisitos: se necesita una recomendacin del DSS. Puede venir 6 veces al ao, con [phone] de [address]. Se requiere identificacin con fotografa y nmero de seguro social para todos los residentes del hogar.  BRITAIN SHEERER MEUSE DE FE Direccin:  [address] [place], [place] [address] Nmero de telfono:  [phone] Horario de atencin:  La despensa de alimentos est abierta el ltimo sbado de cada mes de 10:00 a. m. a 12:00 p. m. No se necesita cita previa. No se requieren requisitos.  IGLESIA PRESBITERIANA DE Kathleen Direccin:  [address] [place], [place] [address] Nmero de telfono: [phone]. [phone] Horario de atencin:  Es oceanographer para education officer, museum comida los sbados. Las inscripciones para recoger comida los sbados comienzan a las 8:30 a. m. del lunes por la maana.  IGLESIA CATLICA SAN PABLO APSTOL Direccin:  [address] [place], [place] [address] Nmero de telfono:  [phone] Horario de atencin:  Si necesita alimentos, traiga una identificacin adecuada, como una licencia de conducir, para recibir physiological scientist bolsa de alimentos una vez al mes. Requisitos: Puede venir una vez cada [address] con una derivacin del DSS, el Ejrcito de Salvacin, salud mental, etc. Cada derivacin es vlida para seis visitas. Se requiere identificacin con fotografa. *No se requiere derivacin para la primera visita.  IGLESIA CAPRICE ZELMA SHEERER YANCY Direccin:  [address] [place], [place] [address] Nmero de telfono:  [phone]  IGLESIA DE LA VIEDA DE [place]  Direccin:  [address] [place], [place] [address] Nmero de telfono:  [phone] Horario de atencin:   Puede registrarse en [email or web address] para obtener alimentos gratis  DESPENSA DE IRVING KELLS DE INDEED Direccin:  [address] [place], [place] [address] Nmero  de telfono:  [phone] Horario de atencin:  Thea en el auto, por orden de llegada. Cada tercer sbado de 11 a. m. a 1 p. m.  [address] Direccin:  [address] [place], [place] [address] Nmero de telfono:  [phone]  [place]  DESPENSA DE ALIMENTOS MANO A MANO Direccin:  [address] [place], [place] [address] Nmero de telfono:  [phone] Horario de atencin:  una vez al mes, cada tercer sbado  IGLESIA DEL RENACIMIENTO Direccin:  [address] [place], [place] [address] Nmero de telfono:  [phone] Horario de atencin:  La distribucin se realiza de 9:00 a 10:00 a. m. todos los sbados.     MANOS AYUDANTES Direccin:  [address] [place], [place] [address] Nmero de telfono:  [phone] Horario de atencin:  UNA VEZ por semana, la distribucin de alimentos a la comunidad se lleva a cabo todos los [place], mircoles y [place] de 11 a. m. a 2 p. m. Los alimentos estn disponibles por orden de llegada y varan de ignacia semana a otra. No es necesario hacer cita para recogerlos en el auto.  IGLESIA METODISTA [facility] Direccin:  [address] [place], [place] [address] Nmero de telfono:  [phone] Horario de atencin:  Abierto cada tercer jueves de 9:30 a. m. a 11:00 a. m.  CENTRO DE EXTENSIN DE LA IGLESIA HOPE Direccin:  [address] [place], [place] [address] Nmero de telfono:  [phone] Horario de atencin:  llame para consultar horarios, direcciones y hacer preguntas.  MARILOU BRAMBLE DE GRAN [place] Direccin: [address] [place], [place] [address] Debroah sheerer telfono: [phone] Sitio web: https://www.hollyguns.co.za Aplicacin Buscador de alimentos: https://findfood.ghpfa.org  SERVICIOS DE CUIDADO[employer] Direccin:  [address] [place], [place] [address] Nmero de telfono:  [phone] Horario de atencin:  comunquese con [employer]. Solo clientes registrados por abuso de sustancias  IGLESIA METODISTA  UNIDA DE FAIRFIELD Direccin:  [address] [place], [address]  Nmero de telfono: [phone] Horario de atencin:  comunquese con Bartley Irving. La despensa de alimentos abre el tercer sbado de cada mes de 9 a. m. a 12 p. m. nicamente.  CENTRO CRISTIANO DE [place] Direccin: [address] [place], [place] [address] Nmero de telfono: [phone] Horario de atencin:  comunquese con Geni Lee. El banco de alimentos de emergencia abre los sbados solo con cita previa.  CENTRO DE RECURSOS FAMILIARES DE MACEDONIA Direccin: [address] [place], [place] [address] Nmero de telfono: [phone] Horario de atencin:  No hay una persona de research scientist (life sciences); cualquiera puede ayudar  MINISTERIOS DEL WEST END[employer] INC[employer] Direccin: [address] [place], [place] [address] Nmero de telfono: [phone] Horario de atencin:  comunquese con Medford Molt. La agencia entrega una bolsa de comida todos los jueves de 2 a 4 p. m. nicamente, y tambin ofrece una comida comunitaria todos los jueves de 5 a 6 p. m. Otros servicios que brinda incluyen asistencia con el alquiler/hipoteca y los servicios pblicos, refugio de invierno para [place], tienda de segunda mano y [place] para artist.  MINISTERIO DE PUERTAS ABIERTAS DE [place] Direccin: [address] [place], [place] [address] Nmero de telfono: [phone] Horario de atencin:  El Programa de asistencia alimentaria de associate professor proporciona a las personas y familias un generoso suministro de alimentos que incluye carne, verduras frescas y productos no perecederos. La caja de alimentos contiene alimentos para [address] y cada familia o individuo puede recibir una caja una vez al mes. Lunes, mircoles, jueves y viernes de 11 a. m. a 2 p. m., se aceptan personas sin cita previa.  SERVICIOS DE SALUD DE [facility] AGENCIA DE ANEMIA FALCIFORME Direccin: [address] [place], [place] [address]  Nmero de telfono:  [phone] Horario de atencin:  Comunquese con Asia Frances. [place] y [place] de 11 a. m. a 3 p. m., solo con cita previa.   Rent/Utility Assistance in [facility]:  [employer] [address] [place] [address] [place], [place] [address] [phone] Mon 8:00am - 6:00pm; Tue 8:00am - 6:00pm; Wed 8:00am - 6:00pm; Thu 8:00am - 6:00pm; Fri 8:00am - 6:00pm; Email: [email or web address] Eligibility: Residents of [place], [place], [place], [place], [place] and [place] that meet income limits. Call or text for eligibility screening.   [facility] [address] [place], [place], [place] [address] [phone] (Main: Rental Assistance) [phone] (Main: Utility Assistance) Mon 8:30am - 5:00pm; Tue 8:30am - 5:00pm; Wed 8:30am - 5:00pm; Thu 8:30am - 5:00pm; Fri 8:30am - 5:00pm; Website: http://www.greensborourbanministry.org/emergency-assistance-program Eligibility: People who have an unexpected crisis or emergency that can be verified. Must have some form of income and meet income limits. At the first of the month, only helps with rent/mortgage assistance for those who have court ordered eviction notices. Call for application information. Call for exact documents that will be needed. Examples of documents that may be needed: Photo ID, Social Security cards for everyone in the household, and proof of income for previous 2 months. Copy of eviction notice for rent assistance and copy of final notice for utility assistance. Statements or receipts of bills for previous 2 months.   [facility] [employer] - [place] [address] [place], [place] [address] [phone] (Main) [phone] (Alternate) Mon 9:00am - 5:00pm; Tue 9:00am - 5:00pm; Wed 9:00am - 5:00pm; Thu 9:00am - 5:00pm; Fri 9:00am - 5:00pm; Website: http://southernusa.salvationarmy.org/[place]/emergency-financial-assistance Email: [email or web address] .[email or web address] Eligibility: People  experiencing a housing crisis with past-due rent and/or utilities and meet income limits. Must be willing to take part in 6 Call or visit website to download application. Return complete application by mail or email only. Documents: Help with Utilities: Photo ID, proof of household income, copies of monthly bills or receipts, and a final disconnection/shut-off notice. Help with Rent or Mortgage: Photo ID, proof of income, copies of monthly bills or receipts, and eviction notice. Help with Household Goods: Photo ID, proof of household income, copies of monthly bills or receipts, and a fire or flood report.  [facility] [place] [address], [place], [place] [address] [phone] (Main) Mon 8:00am - 5:00pm; Tue 8:00am - 5:00pm; Wed 8:00am - 5:00pm; Thu 8:00am - 5:00pm; Fri 8:00am - 12:00pm; Website: http://southernusa.salvationarmy.org/[place]/emergency-financial-assistance Email: [email or web address] .[email or web address] Call for eligibility information. Apply :Utilities Assistance: Visit office by 8:30am on 1st and 4th Monday of each month to pick up application. Rent and Mortgage Assistance: Visit office by 8:30am on 2nd and 3rd Monday of each month to pick up application. NOTE: If Monday falls on a holiday applications can be picked up the following Tuesday. Documents required will be listed on application.  [facility] [phone] (Main) Seen by appointment only. Call for more information. Eligibility: Meet income limits. Apply: Call  for information on how to schedule an appointment. Each month there is a specific day to call to schedule an appointment. It is stated on the agency voicemail message. Appointments fill up quickly each month. Documents: Photo ID, copy of current utility bill.  [facility] [place] [address] [place], [place] [address] [phone] (Main) Tue 9:00am - 4:00pm; Wed 9:00am - 4:00pm; Thu 9:00am -  4:00pm; Website: http://www.helpinghandshigh[place].org Email: helpinghandsclientassistance@gmail .com Eligibility: Utility Assistance: Meet income limits and be a Holiday Representative. [employer] customers do not qualify. Must not have received utility assistance for another agency within the last 90 days. Rent Assistance: Residents of [place] who meet income limits. Must not have received rent assistance for another agency within the last 90 days. Apply: Call to schedule an appointment. Documents: Utility Assistance: Photo ID, City of [employer], copy of lease (if not paying a mortgage), proof of income, and monthly expenses. Rent Assistance: Photo ID, W-9 from the landlord, copy of the lease, proof of income, and a list of monthly expenses.  [facility] [place] [address], [place], [place] [address] [phone] (Main: Help With Rent) [phone] (Main: Help With Utilities) Mon 9:00am - 4:00pm; Tue 9:00am - 4:00pm; Wed 9:00am - 4:00pm; Thu 9:00am - 4:00pm; Fri 9:00am - 4:00pm; Website: [email or web address].no Email: [email or web address] Eligibility: People experiencing a financial crisis. Apply: Call to schedule an appointment Wednesday, 7:30am. Documents: Photo ID, Social Security card, proof of income, and proof of address. Other documents may be required, depending on service. Call for more information.  LOW INCOME ENERGY ASSISTANCE PROGRAM DEPARTMENT OF SOCIAL SERVICES - [facility] [address], [place], [place] [address] [phone] (Main) Mon 8:00am - 5:00pm; Tue 8:00am - 5:00pm; Wed 8:00am - 5:00pm; Thu 8:00am - 5:00pm; Fri 8:00am - 5:00pm; Website: [email or web address]/ Eligibility: Meet income limits and resource guidelines. Each household is only eligible once, even if multiple members  apply. Apply: Call to see if funds are available. Visit to complete an application, call to have 1 mailed, or apply online at [email or web address] NOTE: Households with a person age 16 and over or a person with a documented disability can apply beginning December 1. Other households can apply beginning January 1. Documents: Photo ID, birth certificate, proof of household income, copy of utility bill, latest bank statement, the names and Social Security numbers for everyone in the household, and proof of disability if under age 87.  LOW INCOME ENERGY ASSISTANCE PROGRAM DEPARTMENT OF SOCIAL SERVICES - [facility] [address] [place], [place], [place] [address] [phone] (Main) Mon 8:00am - 5:00pm; Tue 8:00am - 5:00pm; Wed 8:00am - 5:00pm; Thu 8:00am - 5:00pm; Fri 8:00am - 5:00pm; Website: [email or web address] Eligibility: Meet income limits and resource guidelines. Each household is only eligible once, even if multiple members apply. Apply: Call to see if funds are available. Visit to complete an application, call to have 1 mailed, or apply online at [email or web address] NOTE: Households with a person age 31 and over or a person with a documented disability can apply beginning December 1. Other households can apply beginning January 1. Documents: Photo ID, birth certificate, proof of household income, copy of utility bill, latest bank statement, the names and Social Security numbers for everyone in the household, and proof of disability if under age 55.

## 2024-07-25 NOTE — Progress Notes (Signed)
 Reviewed and agree with Dr Rennis plan.

## 2024-07-25 NOTE — Assessment & Plan Note (Deleted)
-   Antibiotics: CTX (11/6- ) for 7 day course - Consider ID consult if uropathogen grows - Pain: Tylenol  1000 mg every 6 hours as needed - Labs: - Blood cultures with no growth < 12 hours - Urine culture pending - AM *** - ***Add on testing for gonorrhea and chlamydia vs swab

## 2024-07-25 NOTE — Progress Notes (Signed)
 Daily Progress Note Intern Pager: [phone]  Patient name: Shelby Brown Medical record number: [id number] Date of birth: 12-23-1980 Age: 43 y.o. Gender: female  Primary Care Provider: Adele Song, MD Consultants: None Code Status: Full  Pt Overview and Major Events to Date:  - 11/6: Admitted (direct admit from clinic)  Medical Decision Making:  Shelby Brown is a 43 y.o. female with PMH/PSH of T2DM, HLD, MDD, recurrent UTI and pyelonephritis. Admitted for LLQ pain and back pain concerning for pyelonephritis in setting of failed outpatient abx tx of UTI. CTAP done 11/7 with findings compatible with cystitis and bilateral pyelonephritis. Also found to have yeast infection on 11/6 wet prep. Assessment & Plan Left lower quadrant pain Pyelonephritis - Antibiotics: IV CTX (11/6- ) for 7 day course; increase to 2g today - Consider ID consult if uropathogen grows - Pain: Tylenol  1000 mg every 6 hours as needed - Labs: - Blood cultures with no growth < 12 hours - Urine culture pending - Lab [place] Yeast infection of the vagina - START Diflucan 150 mg once - Patient low risk for STI STD, no further testing indicated at this time (can consider outpatient as indicated) Chronic health problem Anemia - Stable, no s/s of anemia on exam. No intervention indicated at this time; can follow outpatient. T2DM - A1c of 9.5%. Takes 18 units glargine and 6 units Humalog with dinner. Keep on SSI while admitted. Add LAI as needed. Consider adjusting home regiment at discharge.  FEN/GI: Carb modified PPx: Lovenox  Dispo: Home pending transition from IV to p.o. antibiotics and potential need for ID consult pending culture salts.  Subjective:  Patient reports she is feeling improved from admission, especially with her abdominal pain improved.  She still reports back pain, though she relates some of this to lying on her back overnight.  She does report more frequent bowel movements  recently, especially overnight into this morning; no pain, no blood, no incontinence with this.  Also reports stomach pain after eating, described as burning, which has been ongoing for months and had been present prior to admission.  Objective: Temp:  [98.2 F (36.8 C)-98.5 F (36.9 C)] 98.5 F (36.9 C) (11/07 1120) Pulse Rate:  [81-98] 85 (11/07 1120) Resp:  [16-19] 16 (11/07 1120) BP: (113-124)/(67-82) 114/67 (11/07 1120) SpO2:  [99 %-100 %] 100 % (11/07 1120) Physical Exam: General: Patient sitting up in bed, no acute distress Cardiovascular: Regular rate and rhythm, no murmurs/rubs/gallops. Respiratory:  Normal work of breathing on room air. Clear to auscultation bilaterally; no wheezes, crackles. Abdomen: Bowel sounds present and normoactive bilaterally. Soft, nondistended.  Minimal epigastric tenderness, minimal LLQ tenderness to palpation; no rebound, no guarding.  Mild-moderate bilateral CVA tenderness. Extremities:  Skin warm, dry. No bilateral lower extremity edema.  Laboratory: Most recent CBC Lab Results  Component Value Date   WBC 7.5 07/25/2024   HGB 8.6 (L) 07/25/2024   HCT 26.6 (L) 07/25/2024   MCV 82.6 07/25/2024   PLT 455 (H) 07/25/2024   Most recent BMP    Latest Ref Rng & Units 07/25/2024    2:55 AM  BMP  Glucose 70 - 99 mg/dL 71   BUN 6 - 20 mg/dL 22   Creatinine 9.55 - 1.00 mg/dL 8.94   Sodium 864 - 854 mmol/L 139   Potassium 3.5 - 5.1 mmol/L 4.0   Chloride 98 - 111 mmol/L 104   CO2 22 - 32 mmol/L 24   Calcium 8.9 - 10.3 mg/dL 8.7  A1c: 9.5%  CTAP 11/7: Bilateral striated nephrograms and mild diffuse circumferential bladder wall thickening consistent with cystitis and bilateral pyelonephritis; no kidney stone or obstructive uropathy.  Larraine Palma, MD 07/25/2024, 11:26 AM  PGY-1, [facility] Family Medicine FPTS Intern pager: [phone], text pages welcome Secure chat group [facility] Teaching Service

## 2024-07-25 NOTE — Assessment & Plan Note (Addendum)
-   START Diflucan 150 mg once - Patient low risk for STI STD, no further testing indicated at this time (can consider outpatient as indicated)

## 2024-07-25 NOTE — Progress Notes (Signed)
 Complex Care Management Note Care Guide Note  07/25/2024 Name: Shelby Brown MRN: [id number] DOB: Jun 22, 1981   Complex Care Management Outreach Attempts: An unsuccessful telephone outreach was attempted today to offer the patient information about available complex care management services.  Follow Up Plan:  Additional outreach attempts will be made to offer the patient complex care management information and services.   Encounter Outcome:  No Answer-Left voicemail  Leotis Rase [facility], [place] [facility] Guide  Direct Dial: [phone]  Fax [phone]

## 2024-07-26 ENCOUNTER — Other Ambulatory Visit (HOSPITAL_COMMUNITY): Payer: Self-pay

## 2024-07-26 LAB — GLUCOSE, CAPILLARY
Glucose-Capillary: 182 mg/dL — ABNORMAL HIGH (ref 70–99)
Glucose-Capillary: 125 mg/dL — ABNORMAL HIGH (ref 70–99)

## 2024-07-26 MED ORDER — CEFIXIME 400 MG PO CAPS: 400.0000 mg | ORAL_CAPSULE | Freq: Every day | ORAL | Status: DC

## 2024-07-26 MED ORDER — BASAGLAR KWIKPEN 100 UNIT/ML ~~LOC~~ SOPN
12.0000 [IU] | PEN_INJECTOR | Freq: Every day | SUBCUTANEOUS | 0 refills | Status: DC
  Filled 2024-07-26: qty 3, 25d supply, fill #0

## 2024-07-26 MED ORDER — CEFUROXIME AXETIL 250 MG PO TABS: 500.0000 mg | ORAL_TABLET | Freq: Two times a day (BID) | ORAL | Status: DC

## 2024-07-26 MED ORDER — SODIUM CHLORIDE 0.9 % IV SOLN
2.0000 g | Freq: Once | INTRAVENOUS | Status: AC
  Administered 2024-07-26: 2 g via INTRAVENOUS
  Filled 2024-07-26: qty 20

## 2024-07-26 MED ORDER — CEFUROXIME AXETIL 500 MG PO TABS
500.0000 mg | ORAL_TABLET | Freq: Two times a day (BID) | ORAL | 0 refills | Status: DC
  Filled 2024-07-26: qty 8, 4d supply, fill #0

## 2024-07-26 NOTE — Assessment & Plan Note (Addendum)
-   Antibiotics: IV CTX 2g (11/6-11/12) for 7 day course - Consider ID consult if uropathogen grows - Pain: Tylenol  1000 mg every 6 hours as needed - Labs: - Blood cultures with no growth < 12 hours - Urine culture pending - Lab [address]

## 2024-07-26 NOTE — Assessment & Plan Note (Signed)
 Anemia - Stable, no s/s of anemia on exam. No intervention indicated at this time; can follow outpatient. T2DM - A1c of 9.5%. Takes 18 units glargine and 6 units Humalog with dinner. Keep on SSI while admitted. Add LAI as needed. Consider adjusting home regiment at discharge.

## 2024-07-26 NOTE — Progress Notes (Signed)
 DISCHARGE NOTE HOME Natalee Campos-Ramirez to be discharged Home per MD order. Discussed prescriptions and follow up appointments with the patient. Prescriptions given to patient; medication list explained in detail. Patient verbalized understanding.  Skin clean, dry and intact without evidence of skin break down, no evidence of skin tears noted. IV catheter discontinued intact. Site without signs and symptoms of complications. Dressing and pressure applied. Pt denies pain at the site currently. No complaints noted.  Patient free of lines, drains, and wounds.   An After Visit Summary (AVS) was printed and given to the patient. Patient escorted via wheelchair, and discharged home via private auto.  Shriley Joffe A Proctor-Gann, RN

## 2024-07-26 NOTE — Plan of Care (Signed)

## 2024-07-26 NOTE — Progress Notes (Signed)
     Daily Progress Note Intern Pager: [phone]  Patient name: Shelby Brown Medical record number: [id number] Date of birth: 1980-12-17 Age: 43 y.o. Gender: female  Primary Care Provider: Adele Song, MD Consultants: None Code Status: Full  Pt Overview and Major Events to Date:  11/06: Direct admit from clinic for recurrent UTI and pyelonephritis  MDM:  Cherrill Scrima is a 43yo F admitted for LLQ and back pain concerning for pyelonephritis in the setting of failed outpatient abx treatment. CTAP done 11/7 with findings compatible with cystitis and bilateral pyelonephritis. Also found to have yeast infection on 11/6 wet prep. Pertinent PMH/PSH includes T2DM, HLD, MDD, recurrent UTI and pyelonephritis .  Assessment & Plan Left lower quadrant pain Pyelonephritis - Antibiotics: IV CTX 2g (11/6-11/12) for 7 day course - Consider ID consult if uropathogen grows - Pain: Tylenol  1000 mg every 6 hours as needed - Labs: - Blood cultures with no growth < 12 hours - Urine culture pending - Lab [facility] Yeast infection of the vagina - S/p Diflucan 150 mg once 11/07 - Patient low risk for STI STD, no further testing indicated at this time (can consider outpatient as indicated) Chronic health problem Anemia - Stable, no s/s of anemia on exam. No intervention indicated at this time; can follow outpatient. T2DM - A1c of 9.5%. Takes 18 units glargine and 6 units Humalog with dinner. Keep on SSI while admitted. Add LAI as needed. Consider adjusting home regiment at discharge.  FEN/GI: carb modified PPx: Lovenox  Dispo:Home pending transition from IV to PO abx and potential need for ID consult pending culture results.   Subjective:  Patient was seen and examined at bedside. She states she feels well today, and has no complaints.   Objective: Temp:  [97.8 F (36.6 C)-98.7 F (37.1 C)] 98.7 F (37.1 C) (11/07 2129) Pulse Rate:  [81-95] 88 (11/07 2129) Resp:  [16-19] 19 (11/07  2129) BP: (113-136)/(67-88) 118/73 (11/07 2129) SpO2:  [99 %-100 %] 100 % (11/07 2129) Physical Exam: General: well appearing female resting supine comfortably on hospital bed in no acute distress Cardiovascular: RRR, no m/r/g, 2+ radial pulses Respiratory: CTAB, normal WOB, no w/r/r Abdomen: soft, non-tender, non-distended, BS present Extremities: no peripheral edema, moves all extremities equally  Laboratory: Most recent CBC Lab Results  Component Value Date   WBC 7.5 07/25/2024   HGB 8.6 (L) 07/25/2024   HCT 26.6 (L) 07/25/2024   MCV 82.6 07/25/2024   PLT 455 (H) 07/25/2024   Most recent BMP    Latest Ref Rng & Units 07/25/2024    2:55 AM  BMP  Glucose 70 - 99 mg/dL 71   BUN 6 - 20 mg/dL 22   Creatinine 9.55 - 1.00 mg/dL 8.94   Sodium 864 - 854 mmol/L 139   Potassium 3.5 - 5.1 mmol/L 4.0   Chloride 98 - 111 mmol/L 104   CO2 22 - 32 mmol/L 24   Calcium 8.9 - 10.3 mg/dL 8.7    Lupie Credit, DO 07/26/2024, 5:19 AM  PGY-1, [facility] FPTS Intern pager: [phone], text pages welcome Secure chat group [facility] Teaching Service

## 2024-07-26 NOTE — Plan of Care (Signed)
   Problem: Clinical Measurements: Goal: Respiratory complications will improve Outcome: Progressing   Problem: Activity: Goal: Risk for activity intolerance will decrease Outcome: Progressing

## 2024-07-26 NOTE — Discharge Summary (Signed)
 Family Medicine Teaching [facility] Discharge Summary  Patient name: Shelby Brown Medical record number: [id number] Date of birth: [birth year] Age: 43 y.o. Gender: female Date of Admission: 07/24/2024  Date of Discharge: 07/26/2024 Admitting Physician: Krystal BIRCH McDiarmid, MD  Primary Care Provider: Adele Song, MD Consultants: None  Indication for Hospitalization: Pyelonephritis  Discharge Diagnoses/Problem List:  Principal Problem for Admission: Recurrent pyelonephritis Other Problems addressed during stay:  Principal Problem:   Left lower quadrant pain Active Problems:   Type 2 diabetes mellitus with insulin  therapy (HCC)   Chronic health problem   Pyelonephritis   Yeast infection of the vagina   Brief Hospital Course:  Shelby Brown is a 43 y.o. year old with a history of T2DM, HLD, MDD, recurrent UTI/pyelonephritis who was a direct admit from clinic for pyelonephritis workup and was admitted to the [facility] Medicine Teaching Service.  Pyelonephritis Uncontrolled T2DM Recurrent pyelonephritis with multiple admissions in recent months in the setting of poorly controlled T2DM.  Found to have UTI 07/11/2024, treated with 10-day Keflex  course.  Presented to clinic 11/6 with left lower quadrant pain, back pain, chills with symptoms similar to prior episode of pyelonephritis and had been off her insulin  x 3 weeks.  Direct admit to the hospital given concern over pyelonephritis with failed outpatient antibiotic regimen.  CTAP with contrast had findings suggestive of bilateral pyelonephritis and cystitis.  Treated with IV ceftriaxone  (11/6-11/8). Urine culture negative but pending final result.  Transition to cefuroxime  500 mg twice daily to complete 7-day total course.  Discharged on Basaglar  12 units with instruction to titrate up by 2 units for every fasting CBG >180.  Yeast infection Patient with symptoms of vaginal discharge and itching.  Wet prep done 11/6 with  evidence of yeast infection.  Patient treated with Diflucan 150 mg once on 11/7. Symptoms resolved following.  Other chronic conditions were medically managed with home medications and formulary alternatives as necessary (T2DM).  PCP Follow-up Recommendations: Reassess vaginal itching/pain; consider if STI needed (GC/Chlamydia testing) Discharged on LAI 12 units, titrate as needed for glycemic control Consider OP Urology f/u given recurrent pyelonephritis, may need suppressive antibiotics    Results/Tests Pending at Time of Discharge: None  Disposition: Home  Discharge Condition: Stable  Discharge Exam:  Vitals:   07/26/24 0524 07/26/24 0905  BP: 114/69 (!) 93/53  Pulse: 85 78  Resp: 18 20  Temp: 98.4 F (36.9 C) 98.2 F (36.8 C)  SpO2: 100% 100%   Physical Exam per Dr. Lupie: General: well appearing female resting supine comfortably on hospital bed in no acute distress Cardiovascular: RRR, no m/r/g, 2+ radial pulses Respiratory: CTAB, normal WOB, no w/r/r Abdomen: soft, non-tender, non-distended, BS present Extremities: no peripheral edema, moves all extremities equally  Significant Procedures: None  Significant Labs and Imaging:  Recent Labs  Lab 07/24/24 1810 07/25/24 0255 07/25/24 0624  WBC 8.0 6.9 7.5  HGB 10.0* 8.7* 8.6*  HCT 31.9* 27.5* 26.6*  PLT 569* 478* 455*   Recent Labs  Lab 07/24/24 1810 07/25/24 0255  NA 138 139  K 4.0 4.0  CL 102 104  CO2 24 24  GLUCOSE 148* 71  BUN 22* 22*  CREATININE 1.00 1.05*  CALCIUM 9.0 8.7*  ALKPHOS 76 63  AST 20 18  ALT 19 15  ALBUMIN 3.4* 2.9*    Pertinent Imaging: CT ABDOMEN PELVIS W CONTRAST Result Date: 07/25/2024 IMPRESSION: 1. Bilateral striated nephrograms and mild diffuse circumferential bladder wall thickening. As noted on the previous exam, these  findings are compatible with cystitis and bilateral pyelonephritis. 2. No evidence of kidney stone or obstructive uropathy.     Discharge Medications:   Allergies as of 07/26/2024       Reactions   Metformin  And Related Nausea Only   Poor PO intake and treated with prescription antiemetic agents        Medication List     PAUSE taking these medications    famotidine  20 MG tablet Wait to take this until your doctor or other care provider tells you to start again. Commonly known as: PEPCID  Tome 1 tableta (20 mg en total) por va oral diariamente. (Take 1 tablet (20 mg total) by mouth daily.)   gabapentin  100 MG capsule Wait to take this until your doctor or other care provider tells you to start again. Commonly known as: NEURONTIN  Tomar 1 cpsula (100 mg en total) por va oral 3 (tres) veces al [employer]. (Take 1 capsule (100 mg total) by mouth 3 (three) times daily.)   insulin  lispro 100 UNIT/ML KwikPen Wait to take this until your doctor or other care provider tells you to start again. Commonly known as: HumaLOG KwikPen Inject 6 Units into the skin daily with supper. What changed: how much to take       TAKE these medications    acetaminophen  500 MG tablet Commonly known as: TYLENOL  Take 2 tablets (1,000 mg total) by mouth every 6 (six) hours as needed (first line pain).   Basaglar  KwikPen 100 UNIT/ML Inject 12 Units into the skin daily. What changed: how much to take   cefUROXime  500 MG tablet Commonly known as: CEFTIN  Take 1 tablet (500 mg total) by mouth 2 (two) times daily with a meal for 4 days. Start taking on: July 27, 2024   FeroSul 325 (65 Fe) MG tablet Generic drug: ferrous sulfate  Tome 1 tableta (325 mg en total) por va oral [employer]. (Take 1 tablet (325 mg total) by mouth every other day.)   TRUEplus 5-Bevel Pen Needles 31G X 8 MM Misc Generic drug: Insulin  Pen Needle Use one pen needle twice daily.        Discharge Instructions: Please refer to Patient Instructions section of EMR for full details.  Patient was counseled important signs and symptoms that should prompt return to medical  care, changes in medications, dietary instructions, activity restrictions, and follow up appointments.   Follow-Up Appointments:  Follow-up Information     [facility] Follow up on 07/29/2024.   Why: 11/11 @ 11:10AM Contact information: [address] [place]  [address] [phone]                Theophilus Pagan, MD 07/26/2024, 3:01 PM PGY-3, [facility]

## 2024-07-26 NOTE — Assessment & Plan Note (Addendum)
-   S/p Diflucan 150 mg once 11/07 - Patient low risk for STI STD, no further testing indicated at this time (can consider outpatient as indicated)

## 2024-07-28 ENCOUNTER — Telehealth: Payer: Self-pay

## 2024-07-28 LAB — URINE CULTURE

## 2024-07-28 LAB — C-PEPTIDE: C-Peptide: 1.2 ng/mL (ref 1.1–4.4)

## 2024-07-28 NOTE — Progress Notes (Unsigned)
   SUBJECTIVE:   CHIEF COMPLAINT / HPI:  Shelby Brown is a 43 y.o. female with a pertinent past medical history of uncontrolled T2DM, HLD, MDD, recurrent UTI/pyelonephritis presenting to the clinic for hospital follow up after pyelonephritis admission from 11/6-11/8.  This was a direct admission from clinic to FMTS at [facility].  She has had multiple recent admissions for the same in recent months.  Video interpreter ID #*** was used for duration of this visit.  Pyelonephritis Hospital follow up Patient diagnosed with UTI on 10/24, treated with 10-day Keflex  course.  Presented to clinic 11/6 with left lower quadrant pain, back pain, chills with symptoms similar to prior episode of pyelonephritis and had been off her insulin  x3 weeks.  CTAP suggestive of bilateral pyelonephritis and cystitis.  Treated with IV ceftriaxone  (11/6-11/8).  Transitioned to cefuroxime  500 mg twice daily to complete 7-day total course on 11/12.  Urine culture has resulted with candida tropicalis since discharge.  Today, patient reports she has been adherent to antibiotic regimen***. Denies further back pain, LLQ pain, chills, fevers, nausea/vomiting. States she has been drinking fluids.***  Uncontrolled T2DM Discharged from hospital on Basaglar  12 units with instruction to titrate up by 2 units for every fasting CBG >180. - Prior A1c 9.5 on 11/7, trending up - Home CBGs: *** - Medications: *** - Hypoglycemia plan: *** - Adherence: *** - Eye exam: Due - Foot exam: UTD - Microalbumin: UTD - Statin: None*** - *** symptoms of hypoglycemia, polyuria, polydipsia, numbness of extremities, foot ulcers/trauma  Yeast infection Patient with symptoms of vaginal discharge and itching intpatient.  Wet prep done 11/6 with evidence of yeast infection.  Patient treated with Diflucan 150 mg once on 11/7.  Symptoms resolved following. Today, patient denies further symptoms.***   PCP Follow-up  Recommendations: Reassess vaginal itching/pain; consider if STI needed (GC/Chlamydia testing) Discharged on LAI 12 units, titrate as needed for glycemic control Consider OP Urology f/u given recurrent pyelonephritis, may need suppressive antibiotics   PERTINENT PMH / PSH: Uncontrolled T2DM HLD MDD Recurrent UTIs, Hx pyelonephritis recently on 3/2, 4/8, 7/1, 10/2  *Remainder reviewed in problem list.   OBJECTIVE:   LMP  (LMP Unknown)   General: Age-appropriate, resting comfortably in chair, NAD, alert and at baseline. HEENT:  Head: Normocephalic, atraumatic. No tenderness to percussion over sinuses. Eyes: PERRLA. No conjunctival erythema or scleral injections. Ears: TMs non-bulging and non-erythematous bilaterally. No erythema of external ear canal. No cerumen impaction. Nose: Non-erythematous turbinates. No rhinorrhea. Mouth/Oral: Clear, no tonsillar exudate. MMM. Neck: Supple. No LAD. Cardiovascular: Regular rate and rhythm. Normal S1/S2. No murmurs, rubs, or gallops appreciated. 2+ radial pulses. Pulmonary: Clear bilaterally to ascultation. No wheezes, crackles, or rhonchi. Normal WOB on room air. No accessory muscle use. Abdominal: No tenderness to deep or light palpation. No rebound or guarding. No HSM. Skin: Warm and dry. Extremities: No peripheral edema bilaterally. Capillary refill <2 seconds.  No results found for this or any previous visit (from the past 48 hours).   ASSESSMENT/PLAN:   Assessment & Plan   No follow-ups on file.  Myasia Sinatra Toma, MD [facility]

## 2024-07-28 NOTE — Transitions of Care (Post Inpatient/ED Visit) (Unsigned)
   07/28/2024  Name: Shelby Brown MRN: [id number] DOB: 1981/06/20  Today's TOC FU Call Status: Today's TOC FU Call Status:: Unsuccessful Call (1st Attempt) Unsuccessful Call (1st Attempt) Date: 07/28/24  Attempted to reach the patient regarding the most recent Inpatient/ED visit.  Follow Up Plan: Additional outreach attempts will be made to reach the patient to complete the Transitions of Care (Post Inpatient/ED visit) call.   Signature Julian Lemmings, LPN [facility] Nurse Health Advisor Direct Dial [phone]

## 2024-07-29 ENCOUNTER — Ambulatory Visit: Payer: Self-pay

## 2024-07-29 ENCOUNTER — Inpatient Hospital Stay: Payer: Self-pay

## 2024-07-29 VITALS — BP 110/62 | HR 86 | Ht 62.0 in | Wt 115.0 lb

## 2024-07-29 DIAGNOSIS — N12 Tubulo-interstitial nephritis, not specified as acute or chronic: Secondary | ICD-10-CM

## 2024-07-29 DIAGNOSIS — E1165 Type 2 diabetes mellitus with hyperglycemia: Secondary | ICD-10-CM

## 2024-07-29 DIAGNOSIS — B3749 Other urogenital candidiasis: Secondary | ICD-10-CM

## 2024-07-29 DIAGNOSIS — Z794 Long term (current) use of insulin: Secondary | ICD-10-CM

## 2024-07-29 LAB — CULTURE, BLOOD (ROUTINE X 2)
Culture: NO GROWTH
Culture: NO GROWTH
Special Requests: ADEQUATE
Special Requests: ADEQUATE

## 2024-07-29 MED ORDER — FLUCONAZOLE 100 MG PO TABS: 200.0000 mg | ORAL_TABLET | Freq: Every day | ORAL | 0 refills | Status: DC

## 2024-07-29 MED ORDER — ATORVASTATIN CALCIUM 20 MG PO TABS: 20.0000 mg | ORAL_TABLET | Freq: Every day | ORAL | 3 refills | Status: DC

## 2024-07-29 NOTE — Assessment & Plan Note (Addendum)
 A1c 9.5 in 11/7, poorly controlled.  CBGs ranging ~200s at home.  Given the patient is uninsured, treatment options are more limited. - Increase insulin  glargine to 25 units QAM, patient will continue insulin  lispro 4 units QHS - Start atorvastatin 20 mg - Recommended follow-up with ophthalmology - Patient already scheduled with Dr. Koval in pharmacy clinic on 11/20 - A1c follow-up in 3 months

## 2024-07-29 NOTE — Patient Instructions (Addendum)
  Fue un gusto verte hoy! Gracias por elegir [facility] para tu atencin primaria.  Hoy tratamos los siguientes temas:  Infecciones recurrentes del tracto urinario y de los riones Te derivaron a Personal Assistant; ellos te llamarn para programar una cita. Por favor, llmanos si no recibes risk analyst en las [employer]. Te recomiendo tomar jugo de avon products al da, ya que existen indicios de que esto puede ayudar a radio producer las infecciones del tracto urinario. Al comprar jugo de arndano, elige la variedad sin azcar aadida, pues el exceso de azcar sera perjudicial para tu diabetes.  Infeccin urinaria por hongos Es posible que tengas una infeccin urinaria por hongos. El tratamiento consiste en fluconazol 200 mg (2 pastillas) ignacia lowing al da durante [address]  Diabetes Sus niveles de glucosa en sangre siguen estando [employer]. Le recomiendo que aumente su dosis de insulina a 25 unidades por la omnicom. Tambin le recomiendo [employer] a tomar un medicamento para el colesterol llamado atorvastatina, que deber tomar una vez al da. Esto es muy importante a largo plazo para el control de la diabetes y para proteger su corazn. Por favor, acuda a nuestra consulta con el Dr. Koval, nuestro farmacutico, en nuestra clnica farmacutica el jueves 20 de [place].  Deber regresar a designer, industrial/product del prximo mes para una consulta de seguimiento con su mdico de cabecera para el mantenimiento de su salud.  Gracias por visitarnos en [facility] y por la oportunidad de atenderle!  Dr. Claudetta Silence 07/29/2024, 15:43  _______________________________________________________  It was great to see you today! Thank you for choosing [facility] for your primary care.  Today we addressed: Recurrent urinary tract and kidney infections You had a referral to Urology placed, they will call you to set up an appointment. Please give us  a call if you  don't hear back in the next 2 weeks. I would like you to drink cranberry juice twice daily, there is some evidence this can help prevent urinary tract infections.  When purchasing cranberry juice, please pack the kind that has no sugar added because extra sugar would be bad for your diabetes.  Yeast urinary tract infection You have a possible urinary tract infection with a type of yeast.  We would treat this with a medication called fluconazole 200 mg (2 pills) once daily for 14 days.  Diabetes Your blood sugars are still too high. I would like you to increase your insulin  to 25 units in the morning. I would also like you to start a cholesterol medicine called atorvastatin, which will be taken once daily.  This is very important long-term and diabetes to protect your heart. Please follow-up with our pharmacist Dr. Koval in our pharmacy clinic on Thursday 11/20.  You should return to our clinic within the next month to follow up with your PCP for health maintenance.  Thank you for coming to see us  at [facility] and for the opportunity to care for you! Silence, Shelby Markel, MD 07/29/2024, 3:43 PM

## 2024-07-29 NOTE — Transitions of Care (Post Inpatient/ED Visit) (Unsigned)
   07/29/2024  Name: Shelby Brown MRN: [id number] DOB: Dec 19, 1980  Today's TOC FU Call Status: Today's TOC FU Call Status:: Unsuccessful Call (2nd Attempt) Unsuccessful Call (1st Attempt) Date: 07/28/24 Unsuccessful Call (2nd Attempt) Date: 07/29/24  Attempted to reach the patient regarding the most recent Inpatient/ED visit.  Follow Up Plan: Additional outreach attempts will be made to reach the patient to complete the Transitions of Care (Post Inpatient/ED visit) call.   Signature  Julian Lemmings, LPN [facility] Nurse Health Advisor Direct Dial [phone]

## 2024-07-30 NOTE — Progress Notes (Signed)
 Complex Care Management Note Care Guide Note  07/30/2024 Name: Shelby Brown MRN: [id number] DOB: 01-11-1981   Complex Care Management Outreach Attempts: A second unsuccessful outreach was attempted today to offer the patient with information about available complex care management services.  Follow Up Plan:  Additional outreach attempts will be made to offer the patient complex care management information and services.   Encounter Outcome:  No Answer-No Voicemail  Leotis Rase [facility], [facility] Guide  Direct Dial: [phone]  Fax [phone]

## 2024-07-30 NOTE — Transitions of Care (Post Inpatient/ED Visit) (Signed)
   07/30/2024  Name: Clytee Heinrich MRN: [id number] DOB: 26-Dec-1980  Today's TOC FU Call Status: Today's TOC FU Call Status:: Unsuccessful Call (2nd Attempt) Unsuccessful Call (1st Attempt) Date: 07/28/24 Unsuccessful Call (2nd Attempt) Date: 07/29/24  Attempted to reach the patient regarding the most recent Inpatient/ED visit.  Follow Up Plan: No further outreach attempts will be made at this time. We have been unable to contact the patient.  Signature Julian Lemmings, LPN [facility] Nurse Health Advisor Direct Dial [phone]

## 2024-07-31 NOTE — Progress Notes (Signed)
 Complex Care Management Note Care Guide Note  07/31/2024 Name: Beatrice Ziehm MRN: [id number] DOB: [birth year]   Complex Care Management Outreach Attempts: A third unsuccessful outreach was attempted today to offer the patient with information about available complex care management services.  Follow Up Plan:  Additional outreach attempts will be made to offer the patient complex care management information and services.   Encounter Outcome:  No Answer-Left voicemail  Leotis Rase [facility], [facility] Guide  Direct Dial: [phone]  Fax [phone]

## 2024-08-05 ENCOUNTER — Ambulatory Visit: Payer: Self-pay | Admitting: Pharmacist

## 2024-08-07 ENCOUNTER — Ambulatory Visit: Payer: Self-pay | Admitting: Pharmacist

## 2024-08-07 ENCOUNTER — Encounter: Payer: Self-pay | Admitting: Pharmacist

## 2024-08-07 ENCOUNTER — Other Ambulatory Visit: Payer: Self-pay

## 2024-08-07 VITALS — BP 132/82 | HR 89 | Wt 114.8 lb

## 2024-08-07 DIAGNOSIS — Z794 Long term (current) use of insulin: Secondary | ICD-10-CM

## 2024-08-07 DIAGNOSIS — E1169 Type 2 diabetes mellitus with other specified complication: Secondary | ICD-10-CM

## 2024-08-07 DIAGNOSIS — E785 Hyperlipidemia, unspecified: Secondary | ICD-10-CM

## 2024-08-07 DIAGNOSIS — E1165 Type 2 diabetes mellitus with hyperglycemia: Secondary | ICD-10-CM

## 2024-08-07 MED ORDER — BASAGLAR KWIKPEN 100 UNIT/ML ~~LOC~~ SOPN
15.0000 [IU] | PEN_INJECTOR | Freq: Every day | SUBCUTANEOUS | 3 refills | Status: DC
  Filled 2024-08-07: qty 15, 100d supply, fill #0

## 2024-08-07 MED ORDER — INSULIN LISPRO (1 UNIT DIAL) 100 UNIT/ML (KWIKPEN)
4.0000 [IU] | PEN_INJECTOR | Freq: Every day | SUBCUTANEOUS | 3 refills | Status: DC
  Filled 2024-08-07: qty 3, 28d supply, fill #0

## 2024-08-07 NOTE — Progress Notes (Signed)
 S:     Chief Complaint  Patient presents with   Medication Management    Diabetes management   43 y.o. female who presents for diabetes evaluation, education, and management. Patient arrives in good spirits and presents without  any assistance. Patient is accompanied by daughter.   Patient was referred and last seen by Primary Care Provider, Dr. Toma, on 07/29/2024 following hospitalization for pyelonephritis.  PMH is significant for T2DM, UTI, hyperlipidemia, MDD.  Patient reports Diabetes was diagnosed in 2007.   Current diabetes medications include: Basaglar  (insulin  glargine) 17 units daily Current hyperlipidemia medications include: atorvastatin 20 mg daily  Patient reports adherence to taking all medications as prescribed.   Patient reports hypoglycemic events.  Patient denies nocturia (nighttime urination).  Patient reports neuropathy (nerve pain). Patient reports visual changes. Patient denies self foot exams.   O:   Review of Systems  All other systems reviewed and are negative.   Physical Exam Vitals reviewed.  Constitutional:      Appearance: Normal appearance.  Neurological:     Mental Status: She is alert.  Psychiatric:        Mood and Affect: Mood normal.        Behavior: Behavior normal.        Thought Content: Thought content normal.        Judgment: Judgment normal.    Libre3 CGM Download today 07/25/2024-08/07/2024 % Time CGM is active: 96.1% Average Glucose: 215 mg/dL Glucose Management Indicator: 8.4  Glucose Variability: 38.6% (goal <36%) Time in Goal:  - Time in range 70-180: 36% - Time above range: 63% - Time below range: 1%  Lab Results  Component Value Date   HGBA1C 9.5 (H) 07/25/2024   Vitals:   08/07/24 0849  BP: 132/82  Pulse: 89  SpO2: 100%    Lipid Panel     Component Value Date/Time   CHOL 149 06/10/2024 1558   TRIG 132 06/10/2024 1558   HDL 64 06/10/2024 1558   CHOLHDL 2.3 06/10/2024 1558   CHOLHDL 3.3  Ratio 12/01/2008 7880   VLDL 32 12/01/2008 2119   LDLCALC 62 06/10/2024 1558    Clinical Atherosclerotic Cardiovascular Disease (ASCVD): No  The 10-year ASCVD risk score (Arnett DK, et al., 2019) is: 0.7%   Values used to calculate the score:     Age: 49 years     Clincally relevant sex: Female     Is Non-Hispanic African American: No     Diabetic: Yes     Tobacco smoker: No     Systolic Blood Pressure: 132 mmHg     Is BP treated: No     HDL Cholesterol: 64 mg/dL     Total Cholesterol: 149 mg/dL   A/P: Diabetes longstanding since 2007 currently uncontrolled with GMI of 8.4. Patient is able to verbalize appropriate hypoglycemia management plan. Medication adherence appears good. Control is suboptimal due to suboptimal regimen. Reports not restarting Humalog (insulin  lispro) since hospitalization. Patient reports lows in afternoons that are symptomatic. Also reports having to use the bathroom immediately after meals and pain in stomach after meals. CGM reveals glucose spikes after meals and midday lows and patient does not seem willing to eat a small meal in the afternoon.  -Decreased dose of basal insulin  Basaglar  (insulin  glargine) from 17 to 15 units  -Restarted rapid insulin  Humalog (insulin  lispro) at 4-5 units daily with largest meal. -Extensively discussed pathophysiology of diabetes, recommended lifestyle interventions, dietary effects on blood sugar control.  -Sensors provided -  Counseled on s/sx of and management of hypoglycemia.   ASCVD risk - primary  prevention in patient with diabetes. Last LDL is 62 at goal of <70  mg/dL.  -Continued Atorvastatin 20 mg.   Written patient instructions provided. Patient verbalized understanding of treatment plan.  Total time in face to face counseling 24 minutes.    Follow-up:  Pharmacist 09/04/2024 PCP clinic visit in 08/21/2024 Patient seen with Lawson Mao, PharmD Candidate - PY3 student and [employer] PharmD - PY4 Candidate.

## 2024-08-07 NOTE — Progress Notes (Signed)
 Reviewed and agree with Dr Rennis plan.

## 2024-08-07 NOTE — Patient Instructions (Addendum)
 It was nice to see you today!  Your goal blood sugar is 80-130 before eating and less than 180 after eating.  Medication Changes: Decrease Basaglar  (insulin  glargine) to 15 units daily Restart Humalog  (insulin  lispro) at 4 units with largest meal of the day and up to 5 for heavier meals such as spaghetti  Continue all other medication the same.   Keep up the good work with diet and exercise. Aim for a diet full of vegetables, fruit and lean meats (chicken, turkey, fish). Try to limit salt intake by eating fresh or frozen vegetables (instead of canned), rinse canned vegetables prior to cooking and do not add any additional salt to meals.

## 2024-08-07 NOTE — Assessment & Plan Note (Addendum)
 Diabetes longstanding since 2007 currently uncontrolled with GMI of 8.4. Patient is able to verbalize appropriate hypoglycemia management plan. Medication adherence appears good. Control is suboptimal due to suboptimal regimen. Reports not restarting Humalog  (insulin  lispro) since hospitalization. Patient reports lows in afternoons that are symptomatic. Also reports having to use the bathroom immediately after meals and pain in stomach after meals. CGM reveals glucose spikes after meals and midday lows and patient does not seem willing to eat a small meal in the afternoon.  -Decreased dose of basal insulin  Basaglar  (insulin  glargine) from 17 to 15 units  -Restarted rapid insulin  Humalog  (insulin  lispro) at 4-5 units daily with largest meal. -Extensively discussed pathophysiology of diabetes, recommended lifestyle interventions, dietary effects on blood sugar control.

## 2024-08-07 NOTE — Assessment & Plan Note (Signed)
 ASCVD risk - primary  prevention in patient with diabetes. Last LDL is 62 at goal of <70  mg/dL.  -Continued Atorvastatin 20 mg.

## 2024-08-18 ENCOUNTER — Other Ambulatory Visit: Payer: Self-pay

## 2024-08-21 ENCOUNTER — Other Ambulatory Visit: Payer: Self-pay

## 2024-08-21 ENCOUNTER — Ambulatory Visit: Payer: Self-pay | Admitting: Family Medicine

## 2024-08-21 ENCOUNTER — Observation Stay (HOSPITAL_COMMUNITY)
Admission: EM | Admit: 2024-08-21 | Discharge: 2024-08-23 | DRG: 690 | Disposition: A | Payer: Self-pay | Attending: Family Medicine | Admitting: Family Medicine

## 2024-08-21 ENCOUNTER — Encounter (HOSPITAL_COMMUNITY): Payer: Self-pay

## 2024-08-21 ENCOUNTER — Observation Stay (HOSPITAL_COMMUNITY): Payer: Self-pay

## 2024-08-21 ENCOUNTER — Encounter: Payer: Self-pay | Admitting: Family Medicine

## 2024-08-21 VITALS — BP 88/62 | HR 92 | Ht 62.0 in | Wt 111.2 lb

## 2024-08-21 DIAGNOSIS — R10A0 Flank pain, unspecified side: Secondary | ICD-10-CM | POA: Diagnosis present

## 2024-08-21 DIAGNOSIS — R300 Dysuria: Secondary | ICD-10-CM

## 2024-08-21 DIAGNOSIS — B3731 Acute candidiasis of vulva and vagina: Secondary | ICD-10-CM

## 2024-08-21 DIAGNOSIS — N898 Other specified noninflammatory disorders of vagina: Secondary | ICD-10-CM | POA: Diagnosis present

## 2024-08-21 DIAGNOSIS — E118 Type 2 diabetes mellitus with unspecified complications: Secondary | ICD-10-CM

## 2024-08-21 DIAGNOSIS — N12 Tubulo-interstitial nephritis, not specified as acute or chronic: Secondary | ICD-10-CM

## 2024-08-21 DIAGNOSIS — M79606 Pain in leg, unspecified: Secondary | ICD-10-CM | POA: Insufficient documentation

## 2024-08-21 DIAGNOSIS — Z789 Other specified health status: Secondary | ICD-10-CM

## 2024-08-21 DIAGNOSIS — R1030 Lower abdominal pain, unspecified: Secondary | ICD-10-CM

## 2024-08-21 DIAGNOSIS — D649 Anemia, unspecified: Secondary | ICD-10-CM | POA: Diagnosis present

## 2024-08-21 DIAGNOSIS — E1165 Type 2 diabetes mellitus with hyperglycemia: Secondary | ICD-10-CM | POA: Diagnosis present

## 2024-08-21 DIAGNOSIS — N10 Acute tubulo-interstitial nephritis: Secondary | ICD-10-CM

## 2024-08-21 LAB — CBC
HCT: 30.7 % — ABNORMAL LOW (ref 36.0–46.0)
Hemoglobin: 9.6 g/dL — ABNORMAL LOW (ref 12.0–15.0)
MCH: 27.1 pg (ref 26.0–34.0)
MCHC: 31.3 g/dL (ref 30.0–36.0)
MCV: 86.7 fL (ref 80.0–100.0)
Platelets: 313 K/uL (ref 150–400)
RBC: 3.54 MIL/uL — ABNORMAL LOW (ref 3.87–5.11)
RDW: 15.1 % (ref 11.5–15.5)
WBC: 9.5 K/uL (ref 4.0–10.5)
nRBC: 0 % (ref 0.0–0.2)

## 2024-08-21 LAB — URINALYSIS, MICROSCOPIC (REFLEX): WBC, UA: >50 WBC/hpf (ref 0–5)

## 2024-08-21 LAB — COMPREHENSIVE METABOLIC PANEL WITH GFR
ALT: 16 U/L (ref 0–44)
AST: 22 U/L (ref 15–41)
Albumin: 2.9 g/dL — ABNORMAL LOW (ref 3.5–5.0)
Alkaline Phosphatase: 59 U/L (ref 38–126)
Anion gap: 10 (ref 5–15)
BUN: 16 mg/dL (ref 6–20)
CO2: 19 mmol/L — ABNORMAL LOW (ref 22–32)
Calcium: 8.6 mg/dL — ABNORMAL LOW (ref 8.9–10.3)
Chloride: 105 mmol/L (ref 98–111)
Creatinine, Ser: 1.16 mg/dL — ABNORMAL HIGH (ref 0.44–1.00)
GFR, Estimated: 60 mL/min — ABNORMAL LOW (ref >60)
Glucose, Bld: 183 mg/dL — ABNORMAL HIGH (ref 70–99)
Potassium: 4.6 mmol/L (ref 3.5–5.1)
Sodium: 134 mmol/L — ABNORMAL LOW (ref 135–145)
Total Bilirubin: 0.5 mg/dL (ref 0.0–1.2)
Total Protein: 6.8 g/dL (ref 6.5–8.1)

## 2024-08-21 LAB — URINALYSIS, ROUTINE W REFLEX MICROSCOPIC
Glucose, UA: NEGATIVE mg/dL
Ketones, ur: NEGATIVE mg/dL
Nitrite: NEGATIVE
Protein, ur: 100 mg/dL — AB
Specific Gravity, Urine: 1.025 (ref 1.005–1.030)
pH: 5.5 (ref 5.0–8.0)

## 2024-08-21 LAB — I-STAT CG4 LACTIC ACID, ED: Lactic Acid, Venous: 1.2 mmol/L (ref 0.5–1.9)

## 2024-08-21 LAB — HCG, SERUM, QUALITATIVE: Preg, Serum: NEGATIVE

## 2024-08-21 LAB — CBG MONITORING, ED: Glucose-Capillary: 176 mg/dL — ABNORMAL HIGH (ref 70–99)

## 2024-08-21 LAB — GLUCOSE, CAPILLARY: Glucose-Capillary: 207 mg/dL — ABNORMAL HIGH (ref 70–99)

## 2024-08-21 MED ORDER — ONDANSETRON HCL 4 MG PO TABS
4.0000 mg | ORAL_TABLET | Freq: Four times a day (QID) | ORAL | Status: DC | PRN
  Administered 2024-08-22: 4 mg via ORAL
  Filled 2024-08-21: qty 1

## 2024-08-21 MED ORDER — SODIUM CHLORIDE 0.9 % IV SOLN: INTRAVENOUS | Status: AC

## 2024-08-21 MED ORDER — ATORVASTATIN CALCIUM 10 MG PO TABS
20.0000 mg | ORAL_TABLET | Freq: Every day | ORAL | Status: DC
  Administered 2024-08-22 – 2024-08-23 (×2): 20 mg via ORAL
  Filled 2024-08-21 (×2): qty 2

## 2024-08-21 MED ORDER — SODIUM CHLORIDE 0.9 % IV BOLUS
1000.0000 mL | Freq: Once | INTRAVENOUS | Status: AC
  Administered 2024-08-21: 1000 mL via INTRAVENOUS

## 2024-08-21 MED ORDER — INSULIN ASPART 100 UNIT/ML IJ SOLN
0.0000 [IU] | Freq: Every day | INTRAMUSCULAR | Status: DC
  Administered 2024-08-21: 2 [IU] via SUBCUTANEOUS
  Filled 2024-08-21: qty 2

## 2024-08-21 MED ORDER — ENOXAPARIN SODIUM 40 MG/0.4ML IJ SOSY
40.0000 mg | PREFILLED_SYRINGE | INTRAMUSCULAR | Status: DC
  Administered 2024-08-21 – 2024-08-22 (×2): 40 mg via SUBCUTANEOUS
  Filled 2024-08-21 (×2): qty 0.4

## 2024-08-21 MED ORDER — ONDANSETRON HCL 4 MG/2ML IJ SOLN: 4.0000 mg | Freq: Four times a day (QID) | INTRAMUSCULAR | Status: DC | PRN

## 2024-08-21 MED ORDER — INSULIN ASPART 100 UNIT/ML IJ SOLN
0.0000 [IU] | Freq: Three times a day (TID) | INTRAMUSCULAR | Status: DC
  Administered 2024-08-22 – 2024-08-23 (×3): 3 [IU] via SUBCUTANEOUS
  Filled 2024-08-21: qty 3
  Filled 2024-08-21: qty 5
  Filled 2024-08-21: qty 3

## 2024-08-21 MED ORDER — SODIUM CHLORIDE 0.9 % IV SOLN
2.0000 g | Freq: Once | INTRAVENOUS | Status: AC
  Administered 2024-08-21: 2 g via INTRAVENOUS
  Filled 2024-08-21: qty 20

## 2024-08-21 MED ORDER — ENSURE PLUS HIGH PROTEIN PO LIQD
237.0000 mL | Freq: Two times a day (BID) | ORAL | Status: DC
  Administered 2024-08-22 (×2): 237 mL via ORAL

## 2024-08-21 MED ORDER — FLUCONAZOLE 150 MG PO TABS: 150.0000 mg | ORAL_TABLET | Freq: Once | ORAL | 0 refills | Status: DC

## 2024-08-21 MED ORDER — ACETAMINOPHEN 500 MG PO TABS
1000.0000 mg | ORAL_TABLET | Freq: Four times a day (QID) | ORAL | Status: DC | PRN
  Administered 2024-08-21 – 2024-08-22 (×3): 1000 mg via ORAL
  Filled 2024-08-21 (×4): qty 2

## 2024-08-21 NOTE — Assessment & Plan Note (Signed)
 Patient reports vaginal itching.  Likely that she has another yeast infection in the setting of poorly controlled diabetes.  Provided prescription for fluconazole .

## 2024-08-21 NOTE — ED Provider Triage Note (Signed)
 Emergency Medicine Provider Triage Evaluation Note  Shelby Brown , a 43 y.o. female  was evaluated in triage.  Pt complains of low blood pressure. Patient was seen abdomen medicine today for follow-up.  Is post be seen by urology for recurrent pyelonephritis however patient has been lost to follow-up.  She was in today for lower blood pressure.  Patient reports that she has been having some diarrhea as well.  Has been feeling feverish for the past few days.  She reports that she is not feeling lightheadedness upon standing for months since March 2025.  Review of Systems  Positive:  Negative:   Physical Exam  BP 92/62   Pulse 84   Temp 97.6 F (36.4 C)   Resp 16   Ht 5' 2 (1.575 m)   Wt 50.3 kg   LMP 07/14/2024   SpO2 100%   BMI 20.30 kg/m  Gen:   Awake, no distress   Resp:  Normal effort  MSK:   Moves extremities without difficulty  Other:  Very low abdominal tenderness. No CVA tenderness. Awake, alert.  Medical Decision Making  Medically screening exam initiated at 12:48 PM.  Appropriate orders placed.  Shelby Brown was informed that the remainder of the evaluation will be completed by another provider, this initial triage assessment does not replace that evaluation, and the importance of remaining in the ED until their evaluation is complete.  Patient mildly hypotensive but maps greater than 65.  I have ordered on a lactic and singular blood culture to the patient's labs.   Bernis Ernst, [place] 08/21/24 1251

## 2024-08-21 NOTE — H&P (Signed)
 Hospital Admission History and Physical Service Pager: [phone]  Patient name: Shelby Brown Medical record number: [id number] Date of Birth: [birth year] Age: 43 y.o. Gender: female  Primary Care Provider: Adele Song, MD Consultants: None Code Status: Full Code  Preferred Emergency Contact:  Contact Information     Name Relation Home Work Mobile   [name],[name] Spouse [phone]  [phone]   R,[name] Daughter [phone]     [name] Knee Daughter   [phone]      Other Contacts   None on File      Chief Complaint: sent from PCP for low BP   Differential and Medical Decision Making:  Shelby Brown is a 43 y.o. female presenting with hypotension, decreased PO intake and headache.   Differential for this patient's presentation of this includes UTI, pyelonephritis. Patient with history of recurrent UTIs with CT evidence of pyelonephritis on atleast two prior admissions. Patient afebrile without leukocytosis on admission. Blood pressure soft on admission, now improved with fluid resuscitation. With patients history of recurrent UTIs and pyelonephritis, suspect she may have another bout of pyelonephritis. Last CT was done 07/2024, do not feel repeat would contribute to clinical picture. Patient does have increase in creatinine to 1.16 from baseline around 1, likely 2/2 to poor PO intake. Will admit patient for observation and IV antibiotics.  Assessment & Plan Flank pain - admit to observation, Attending Dr McDiarmid - Megsug level of care, vitals per floor - Regular diet - ABX: CTX (11/6- )  - Consult urology in the AM  - Pain: tylenol  1000mg  q6h prn - Labs:  - urine culture pending  - AM CBC, CMP   Chronic and Stable Conditions: DMII: Currently taking 15U LAI in the AM and 5 U at night. Last A1C was 9.5. Given poor PO, will start patient on SSI and monitor CBGs   FEN/GI: Regular Diet  VTE Prophylaxis: Lovenox    Disposition: MedSurg    History of Present Illness:  Shelby Brown is a 43 y.o. female presenting with hypotension and headache from PCP. Reports poor PO for the last few days with nausea but no vomiting. Also reported some suprapubic pain and bilateral back pain which feels similar to her previous kidney functions. She has been afebrile at home. Patient reports she is complaint with medication at home. She was admitted for similar symptoms in October and November of this year and treated with IV antibiotics. She has not been able to follow up with urology outpatient.     Review Of Systems: Per HPI   Pertinent Past Medical History: - DMII - HLD - MDD - Recurrent UTI   Pertinent Past Surgical History: Bilateral tubal ligation    Remainder reviewed in history tab.  Pertinent Social History: Tobacco use: No Alcohol use: No Other Substance use: no Lives with husband and kids   Pertinent Family History: Reviewed, no know history of kidney pathology   Important Outpatient Medications: Famotidine  20mg  daily - not taking Iron 325 mg daily Gabapentin  100mg  TID  Basaglar  15 units in AM Lispro 5 units at night     Objective: BP (!) 149/83   Pulse 82   Temp 98.5 F (36.9 C) (Oral)   Resp 16   Ht 5' 2 (1.575 m)   Wt 50.3 kg   LMP 07/14/2024   SpO2 100%   BMI 20.30 kg/m  Exam: General: A&O, NAD HEENT: No sign of trauma, EOM grossly intact Cardiac: RRR, no m/r/g Respiratory: CTAB, normal WOB, no w/c/r GI: Soft,  mildly TTP at suprapubic region, bilateral CVA tenderness  Extremities: NTTP, no peripheral edema. Neuro: no acute abnormalities  Psych: Appropriate mood and affect   Labs:  CBC BMET  Recent Labs  Lab 08/21/24 1146  WBC 9.5  HGB 9.6*  HCT 30.7*  PLT 313   Recent Labs  Lab 08/21/24 1146  NA 134*  K 4.6  CL 105  CO2 19*  BUN 16  CREATININE 1.16*  GLUCOSE 183*  CALCIUM  8.6*     Urinalysis    Component Value Date/Time   COLORURINE YELLOW 08/21/2024 1136    APPEARANCEUR CLOUDY (A) 08/21/2024 1136   APPEARANCEUR Clear 12/31/2023 1448   LABSPEC 1.025 08/21/2024 1136   PHURINE 5.5 08/21/2024 1136   GLUCOSEU NEGATIVE 08/21/2024 1136   GLUCOSEU > 1000 mg/dL (A) 96/75/7990 7855   HGBUR SMALL (A) 08/21/2024 1136   BILIRUBINUR SMALL (A) 08/21/2024 1136   BILIRUBINUR negative 07/24/2024 0955   BILIRUBINUR Negative 12/31/2023 1448   KETONESUR NEGATIVE 08/21/2024 1136   PROTEINUR 100 (A) 08/21/2024 1136   UROBILINOGEN 0.2 07/24/2024 0955   UROBILINOGEN 0.2 11/28/2014 1725   NITRITE NEGATIVE 08/21/2024 1136   LEUKOCYTESUR MODERATE (A) 08/21/2024 1136      EKG:  NSR @ 85 bpm    Imaging Studies Performed: No new imaging   [name], MD 08/21/2024, 7:18 PM PGY-2, [facility]  FPTS Intern pager: [phone], text pages welcome Secure chat group [facility] Teaching Service

## 2024-08-21 NOTE — ED Provider Notes (Signed)
 [place] EMERGENCY DEPARTMENT AT [facility] Provider Note   CSN: [id number] Arrival date & time: 08/21/24  1049     Patient presents with: No chief complaint on file.   Shelby Brown is a 43 y.o. female.  With past medical history of type 2 diabetes mellitus with polyneuropathy, hyperlipidemia, recurrent pyelonephritis that presents today from family medicine clinic with concerns for hypotension.  Patient also reported that time that she was having a bowel 1 week suprapubic pain, dysuria, subjective fevers.  She states that she had some nausea and vomiting last week.  She was stating that she also has been having this chronic diarrhea that has been going on for years but has gotten worse since March.  Her daughter is at bedside and is able to give some history as well.  She states that every time her mother has food that it goes straight through her and she has this diarrhea.   HPI     Prior to Admission medications   Medication Sig Start Date End Date Taking? Authorizing Provider  acetaminophen  (TYLENOL ) 500 MG tablet Take 2 tablets (1,000 mg total) by mouth every 6 (six) hours as needed (first line pain). 06/20/24   Theophilus Pagan, MD  atorvastatin  (LIPITOR) 20 MG tablet Take 1 tablet (20 mg total) by mouth daily. 07/29/24   Shitarev, Dimitry, MD  famotidine  (PEPCID ) 20 MG tablet Take 1 tablet (20 mg total) by mouth daily. Patient not taking: Reported on 08/07/2024 06/20/24   Theophilus Pagan, MD  ferrous sulfate  325 (65 FE) MG tablet Take 1 tablet (325 mg total) by mouth every other day. Patient not taking: Reported on 08/07/2024 06/01/24   Delores Suzann HERO, MD  fluconazole  (DIFLUCAN ) 150 MG tablet Take 1 tablet (150 mg total) by mouth once for 1 dose. Take 1 more dose 3 days later if symptoms continue. 08/21/24 08/21/24  Adele Song, MD  gabapentin  (NEURONTIN ) 100 MG capsule Take 1 capsule (100 mg total) by mouth 3 (three) times daily. 07/24/24   McDiarmid, Krystal BIRCH, MD   Insulin  Glargine (BASAGLAR  KWIKPEN) 100 UNIT/ML Inject 15 Units into the skin daily. 08/07/24   McDiarmid, Krystal BIRCH, MD  insulin  lispro (HUMALOG  KWIKPEN) 100 UNIT/ML KwikPen Inject 4-5 Units into the skin daily with supper. 08/07/24   McDiarmid, Krystal BIRCH, MD  Insulin  Pen Needle 31G X 8 MM MISC Use one pen needle twice daily. 05/29/24   McDiarmid, Krystal BIRCH, MD  promethazine  (PHENERGAN ) 6.25 MG/5ML syrup Take 5 mLs (6.25 mg total) by mouth 4 (four) times daily as needed for nausea or vomiting. Patient not taking: Reported on 11/16/2020 09/13/20 11/19/20  Lue Elsie BROCKS, MD    Allergies: Metformin  and related    Review of Systems As noted in HPI BP 127/81   Pulse 84   Temp 98.2 F (36.8 C)   Resp 16   Ht 5' 2 (1.575 m)   Wt 50.3 kg   LMP 07/14/2024   SpO2 100%   BMI 20.30 kg/m   Physical Exam  (all labs ordered are listed, but only abnormal results are displayed) Labs Reviewed  COMPREHENSIVE METABOLIC PANEL WITH GFR - Abnormal; Notable for the following components:      Result Value   Sodium 134 (*)    CO2 19 (*)    Glucose, Bld 183 (*)    Creatinine, Ser 1.16 (*)    Calcium  8.6 (*)    Albumin 2.9 (*)    GFR, Estimated 60 (*)  All other components within normal limits  CBC - Abnormal; Notable for the following components:   RBC 3.54 (*)    Hemoglobin 9.6 (*)    HCT 30.7 (*)    All other components within normal limits  URINALYSIS, ROUTINE W REFLEX MICROSCOPIC - Abnormal; Notable for the following components:   APPearance CLOUDY (*)    Hgb urine dipstick SMALL (*)    Bilirubin Urine SMALL (*)    Protein, ur 100 (*)    Leukocytes,Ua MODERATE (*)    All other components within normal limits  URINALYSIS, MICROSCOPIC (REFLEX) - Abnormal; Notable for the following components:   Bacteria, UA MANY (*)    Non Squamous Epithelial PRESENT (*)    All other components within normal limits  CBG MONITORING, ED - Abnormal; Notable for the following components:   Glucose-Capillary  176 (*)    All other components within normal limits  CULTURE, BLOOD (SINGLE)  HCG, SERUM, QUALITATIVE  I-STAT CG4 LACTIC ACID, ED    EKG: EKG Interpretation Date/Time:  Thursday August 21 2024 11:59:30 EST Ventricular Rate:  85 PR Interval:  140 QRS Duration:  70 QT Interval:  374 QTC Calculation: 445 R Axis:   65  Text Interpretation: Normal sinus rhythm  Age indeterminate anterior infarct Compared with prior EKG from 07/24/2024 Confirmed by Gennaro Bouchard ([id number]) on 08/21/2024 2:44:00 PM  Radiology: No results found.   Procedures   Medications Ordered in the ED  sodium chloride  0.9 % bolus 1,000 mL (1,000 mLs Intravenous New Bag/Given 08/21/24 1512)                                    Medical Decision Making Amount and/or Complexity of Data Reviewed Labs: ordered.   Differentials included cystitis, urethritis, vulvovaginitis, pyelonephritis, hydronephrosis, acute bilateral ureteral obstruction, abdominal aortic aneurysm  Labs show  UA with small hemoglobin, leukocytes, negative nitrites, many bacteria and pyuria consistent with infection.  Creatinine is elevated from baseline at 1.16.  CBC seems to be at baseline.  pregnancy test negative.  Lactic acid 1.2  Patient's blood pressure has improved after fluids but on physical exam patient is still having suprapubic pain and bilateral flank pain.  Past 2 CT abdomen pelvis that showed bilateral pyelonephritis.  With her symptoms likely pyelonephritis.  Will go ahead and start IV Rocephin  as previous cultures were sensitive to this.  Also with her hypotension that she came in with and some dehydration would be beneficial for admission for some IV antibiotics for likely pyelonephritis.  Did discuss case with family medicine who agreed to admission.      Final diagnoses:  None    ED Discharge Orders     None          D'Mello, Sacha Topor, DO 08/21/24 1822    Emil Share, DO 08/21/24 1956

## 2024-08-21 NOTE — Patient Instructions (Addendum)
 Please go to the ED for further evaluation.   Please schedule a follow up appointment with me next week if able.  You can try calling the Urology office to schedule an appointment-  [facility] [place] [address] [place],  [place]  [address] Main: [phone]  Best, Dr Adele

## 2024-08-21 NOTE — Progress Notes (Signed)
    SUBJECTIVE:   CHIEF COMPLAINT / HPI:   DM2 --15units insulin  AM and 5units in afternoon --CGM: 216 avg and GMI 8.5% in the last 2 weeks, 36% in range, 30% high and 32% very high --has been very dizzy in the morning for the last few days, glucose has been 69-70 --has not been hungry, has not been eating much all week --Started lipitor 20mg  at last appt  Headache for 1 week  Chronic upper back/neck pain  Lower abdominal pain --feels like a UTI --denies frequency but dysuria and struggling to pee --also reports vaginal itching, took fluconazole  in the past and it helped --feels like she's been feverish for a 1 week --vomited last week --lots of diarrhea, scared to eat because of this   Has not heard from urology, referral was sent to [facility] [place]. Daughter [name] is contact person  PERTINENT  PMH / PSH: reviewed  OBJECTIVE:   BP (!) 88/62   Pulse 92   Ht 5' 2 (1.575 m)   Wt 111 lb 3.2 oz (50.4 kg)   LMP 07/14/2024   SpO2 100%   BMI 20.34 kg/m   General: Awake and alert, in mild distress HEENT: dry mucus membranes CV: RRR, normal S1/S2, no M/R/G Pulm: CTAB, normal work of breathing on room air, no W/R/R. MSK: L sided CVA tenderness  Neuro: No focal deficits Psych: Appropriate mood and affect   ASSESSMENT/PLAN:   Assessment & Plan Dysuria Lower abdominal pain Patient reports dysuria, lower abdominal pain, possible fevers for about 1 week.  Also reports diarrhea and poor p.o. intake.  Concern for dehydration and/or recurrent UTI versus pyelonephritis given frequent history of this, general ill-appearance, and soft BP.  Advised going to the emergency room for further evaluation, including UA (patient unable to provide urine sample in clinic).  Patient and daughter agreeable to this, will self transport.  Handoff given to ED triage nurse. Yeast infection of the vagina Patient reports vaginal itching.  Likely that she has another yeast infection in the  setting of poorly controlled diabetes.  Provided prescription for fluconazole . DM (diabetes mellitus) with complications (HCC) Appears the diabetes is better controlled today than in the past.  CGM reviewed as above, average glucose in the 200s with projected A1c around 8.5.  Advised patient to schedule follow-up appointment with me next week.  Patient has follow-up with [facility] pharmacist for 12/18. Recurrent pyelonephritis Urology referral-close due to office unable to contact patient.  Provided patient and daughter with phone number for [facility] urology [place].  Will look into this further at next appointment.     Rea Raring, MD [facility]

## 2024-08-21 NOTE — ED Triage Notes (Signed)
 Pt sent from doctors office and was sent here due to low blood pressure. Pt reports feeling dizzy and fatigued that has worsened in the past few days. Family reports pt has had a decreased appetite for the past few days. C/o lower abdominal pain that has been ongoing for months.

## 2024-08-21 NOTE — Assessment & Plan Note (Signed)
-   admit to observation, Attending Dr McDiarmid - Megsug level of care, vitals per floor - Regular diet - ABX: CTX (11/6- )  - Consult urology in the AM  - Pain: tylenol  1000mg  q6h prn - Labs:  - urine culture pending  - AM CBC, CMP

## 2024-08-21 NOTE — Plan of Care (Signed)
 FMTS Interim Progress Note  S: Saw for nighttime rounds.  Husband at bedside and feels patient has gotten a little better.  Patient continues to have some abdominal pain and back pain, left greater than right.  She has been able to eat a little since has been here.  Has been going to the bathroom.  O: BP (!) 145/81 (BP Location: Left Arm)   Pulse 85   Temp 98.1 F (36.7 C) (Oral)   Resp 20   Ht 5' 2 (1.575 m)   Wt 50.3 kg   LMP 07/14/2024   SpO2 100%   BMI 20.30 kg/m   General: Alert and oriented, in NAD Skin: Warm, dry, and intact  HEENT: NCAT, EOM grossly normal, midline nasal septum Respiratory/back: Breathing and speaking comfortably on RA, CVA tenderness left greater than right Abdominal: Soft, mildly tender throughout, nondistended, normoactive bowel sounds Extremities: Moves all extremities grossly equally Neurological: No gross focal deficit, ambulatory Psychiatric: Appropriate mood and affect   A/P: Flank pain Concern for recurrent pyelonephritis as seen on multiple prior CT abdomen pelvis examinations.  Reassuring vitals and exam tonight.  I have ordered a renal ultrasound for more structural visualization of her renal tract given her recurrent episodes.  Continue Rocephin  for now and await urine culture.  Plan to consult urology in the a.m.  Tharon Lung, MD 08/21/2024, 10:01 PM PGY-3, [facility] Family Medicine Service pager [phone]

## 2024-08-22 DIAGNOSIS — Z794 Long term (current) use of insulin: Secondary | ICD-10-CM

## 2024-08-22 DIAGNOSIS — E1165 Type 2 diabetes mellitus with hyperglycemia: Secondary | ICD-10-CM

## 2024-08-22 DIAGNOSIS — M79606 Pain in leg, unspecified: Secondary | ICD-10-CM | POA: Insufficient documentation

## 2024-08-22 DIAGNOSIS — N12 Tubulo-interstitial nephritis, not specified as acute or chronic: Secondary | ICD-10-CM

## 2024-08-22 LAB — BASIC METABOLIC PANEL WITH GFR
Anion gap: 11 (ref 5–15)
BUN: 17 mg/dL (ref 6–20)
CO2: 23 mmol/L (ref 22–32)
Calcium: 8 mg/dL — ABNORMAL LOW (ref 8.9–10.3)
Chloride: 104 mmol/L (ref 98–111)
Creatinine, Ser: 0.93 mg/dL (ref 0.44–1.00)
GFR, Estimated: >60 mL/min (ref >60)
Glucose, Bld: 126 mg/dL — ABNORMAL HIGH (ref 70–99)
Potassium: 4.1 mmol/L (ref 3.5–5.1)
Sodium: 138 mmol/L (ref 135–145)

## 2024-08-22 LAB — CBC
HCT: 26.6 % — ABNORMAL LOW (ref 36.0–46.0)
Hemoglobin: 8.6 g/dL — ABNORMAL LOW (ref 12.0–15.0)
MCH: 26.8 pg (ref 26.0–34.0)
MCHC: 32.3 g/dL (ref 30.0–36.0)
MCV: 82.9 fL (ref 80.0–100.0)
Platelets: 295 K/uL (ref 150–400)
RBC: 3.21 MIL/uL — ABNORMAL LOW (ref 3.87–5.11)
RDW: 15 % (ref 11.5–15.5)
WBC: 6.9 K/uL (ref 4.0–10.5)
nRBC: 0 % (ref 0.0–0.2)

## 2024-08-22 LAB — GLUCOSE, CAPILLARY
Glucose-Capillary: 136 mg/dL — ABNORMAL HIGH (ref 70–99)
Glucose-Capillary: 105 mg/dL — ABNORMAL HIGH (ref 70–99)
Glucose-Capillary: 194 mg/dL — ABNORMAL HIGH (ref 70–99)
Glucose-Capillary: 154 mg/dL — ABNORMAL HIGH (ref 70–99)
Glucose-Capillary: 144 mg/dL — ABNORMAL HIGH (ref 70–99)

## 2024-08-22 MED ORDER — HYDROCORTISONE 1 % EX CREA
TOPICAL_CREAM | Freq: Two times a day (BID) | CUTANEOUS | Status: DC
  Filled 2024-08-22: qty 28

## 2024-08-22 MED ORDER — FLUCONAZOLE 150 MG PO TABS
150.0000 mg | ORAL_TABLET | Freq: Once | ORAL | Status: AC
  Administered 2024-08-22: 150 mg via ORAL
  Filled 2024-08-22: qty 1

## 2024-08-22 MED ORDER — SODIUM CHLORIDE 0.9 % IV SOLN
2.0000 g | INTRAVENOUS | Status: DC
  Administered 2024-08-22: 2 g via INTRAVENOUS
  Filled 2024-08-22: qty 20

## 2024-08-22 NOTE — Assessment & Plan Note (Addendum)
 MDD: Not currently on any medication, will plan to discuss in further evaluate for possible restart of SSRI Hyperlipidemia: Atorvastatin  20 mg daily

## 2024-08-22 NOTE — Assessment & Plan Note (Addendum)
 Endorsed worsening burning-like pain of thigh overnight, appears to have improved with cream overnight Continue topical hydrocortisone  cream 1%

## 2024-08-22 NOTE — Plan of Care (Signed)

## 2024-08-22 NOTE — Assessment & Plan Note (Signed)
 History of recurrent UTIs, occasionally with pyelo-, given recent hospitalization some concern for MDR strains but previous culture supports that sensitivity to CTX S/p ceftriaxone  2 g 12/4, will redose ctx today and transition to PO tomorrow Start methenamine after antibiotic course Outpatient urology consult Urine culture pending I's and O's: 900 cc urine output this morning Renal ultrasound found no hydro or bladder distention Pain: tylenol  1000mg  q6h prn A.m. labs: CBC, BMP

## 2024-08-22 NOTE — Assessment & Plan Note (Signed)
 Endorse vaginal itchiness at PCP visit yesterday, did not receive Diflucan  yesterday which has helped previously. -150 mg p.o. fluconazole  today

## 2024-08-22 NOTE — Hospital Course (Addendum)
 Shelby Brown is a 43 y.o.female with a history of recurrent UTIs with pyelonephritis, T2DM who was admitted to the family medicine teaching Service at [facility] for UTI with poor p.o. Her hospital course is detailed below:  Uncomplicated interstitial cystitis ?Pyelonephritis Presenting with worsening suprapubic pain and discomfort, persistent low appetite and softer blood pressures in clinic. In the ED vital signs were stable, UA and microscopy consistent with UTI, patient was started on ceftriaxone .  Urine culture showed gram-negative rods, sensitivities had not resulted prior to discharge.  Renal ultrasound was completed due to patient having multiple CT A/P previously, which was negative for pyelonephritis.  Patient was placed on continuous fluids due to poor PO which were discontinued prior to discharge once patient was drinking more fluids. 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  Normocytic anemia Likely component to patient's general malaise, difficult to assess baseline given frequent hospitalization appears to be around 10.  Previous hospitalizations found likely multifactorial contribution anemia chronic disease with IDA, patient not taking p.o. iron supplements. Iron supplementation was restarted during this hospitalization.   Vaginal itching Patient presented to clinic with complaints of vaginal itching. GU exam not done during admission, but did treat with 1 dose fluconazole  .  Other chronic conditions were medically managed with home medications and formulary alternatives as necessary (T2DM, hyperlipidemia, MDD  PCP Follow-up Recommendations: Considering started Methenamine for UTI prophylaxis after ABX course Urine culture pending, if not sensitive to cephalosporins, treat with other antibiotic Encourage starting SSRI  outpatient for depression OB and urology outpatient follow-up Consider switching statin if still having myalgias Consider restarting gabapentin  and famotidine , which were held on last admission

## 2024-08-22 NOTE — Assessment & Plan Note (Addendum)
 CBGs at goal overnight, received 2 units sliding scale.  Home regimen 15 units glargine a.m. 5 units at night Moderate SSI Continue to hold additional insulin , and add back 10 units glargine daily once better p.o.

## 2024-08-22 NOTE — Progress Notes (Signed)
 Daily Progress Note Intern Pager: [phone]  Patient name: Shelby Brown Medical record number: [id number] Date of birth: [birth year] Age: 43 y.o. Gender: female  Primary Care Provider: Adele Song, MD Consultants: None Code Status: Full code  Pt Overview and Major Events to Date:  12/4-admitted to family medicine service  Medical Decision Making:  Shelby Brown is a 43 y.o. female with past medical history of previous UTIs and pyelonephritis, T2DM, hyperlipidemia admitted for complex interstitial cystitis.  Doing a little better from yesterday, continues to have very little appetite. Assessment & Plan Flank pain Recurrent pyelonephritis History of recurrent UTIs, occasionally with pyelo-, given recent hospitalization some concern for MDR strains but previous culture supports that sensitivity to CTX S/p ceftriaxone  2 g 12/4, will redose ctx today and transition to PO tomorrow Start methenamine after antibiotic course Outpatient urology consult Urine culture pending I's and O's: 900 cc urine output this morning Renal ultrasound found no hydro or bladder distention Pain: tylenol  1000mg  q6h prn A.m. labs: CBC, BMP Type 2 diabetes mellitus with hyperglycemia (HCC) CBGs at goal overnight, received 2 units sliding scale.  Home regimen 15 units glargine a.m. 5 units at night Moderate SSI Continue to hold additional insulin , and add back 10 units glargine daily once better p.o. Leg pain, anterior Endorsed worsening burning-like pain of thigh overnight, appears to have improved with cream overnight Continue topical hydrocortisone  cream 1% Normocytic anemia Hgb down to 8.6 from 9.6, recent labs baseline appears to be around 9.5-10, previous iron studies had mixed ACD/IDA picture previous ferritin lowest 22, iron level 80's.  Reticulocyte 2 months ago 0.4%, possible GI loss.  Symptomatic? Restart p.o. ferrous sulfate  325 mg every other day. Vaginal itching Endorse  vaginal itchiness at PCP visit yesterday, did not receive Diflucan  yesterday which has helped previously. -150 mg p.o. fluconazole  today Chronic health problem MDD: Not currently on any medication, will plan to discuss in further evaluate for possible restart of SSRI Hyperlipidemia: Atorvastatin  20 mg daily   FEN/GI: Regular, changed to carb modified PPx: Lovenox  Dispo:Pending PT recommendations    Subjective:  Saw patient at bedside with Spanish interpreter Ed was present on tablet.  Reports that she is in feeling a little more lightheaded and dizzy and has had decreased appetite, largely unchanged over the last few weeks.  States that she has very intermittent abdominal pain that comes and goes and has not been able to associated with position or activity.  Denies any nausea, vomiting, or weight loss.  Reports she is having irregular periods last couple weeks where she will miss a period every now and then.  Objective: Temp:  [97.6 F (36.4 C)-98.8 F (37.1 C)] 98.8 F (37.1 C) (12/05 0400) Pulse Rate:  [73-92] 73 (12/05 0400) Resp:  [16-20] 20 (12/05 0400) BP: (82-156)/(62-91) 124/70 (12/05 0400) SpO2:  [98 %-100 %] 99 % (12/05 0400) Weight:  [50.3 kg-50.4 kg] 50.3 kg (12/04 1130) Physical Exam: General: Resting comfortably in no acute distress  cardiovascular: Regular rate rhythm, normal S1 and S2, no murmurs rubs or gallops Respiratory: Clear to auscultation bilaterally, no wheezes or crackles Abdomen: Active bowel sounds, mild suprapubic tenderness, nonradiating Extremities: Nonedematous, nontender   Laboratory: Most recent CBC Lab Results  Component Value Date   WBC 6.9 08/22/2024   HGB 8.6 (L) 08/22/2024   HCT 26.6 (L) 08/22/2024   MCV 82.9 08/22/2024   PLT 295 08/22/2024   Most recent BMP    Latest Ref Rng & Units 08/22/2024  5:25 AM  BMP  Glucose 70 - 99 mg/dL 873   BUN 6 - 20 mg/dL 17   Creatinine 9.55 - 1.00 mg/dL 9.06   Sodium 864 - 854 mmol/L 138    Potassium 3.5 - 5.1 mmol/L 4.1   Chloride 98 - 111 mmol/L 104   CO2 22 - 32 mmol/L 23   Calcium  8.9 - 10.3 mg/dL 8.0     Other pertinent labs CBGs 207-136-126 overnight   Imaging/Diagnostic Tests: Renal ultrasound 12/4 IMPRESSION: 1. No hydronephrosis or renal fluid collection. 2. Nondistended urinary bladder. Bladder wall thickening versus nondistention.  Lorrane Pac, MD 08/22/2024, 7:26 AM  PGY-1, [facility] Family Medicine FPTS Intern pager: [phone], text pages welcome Secure chat group [facility] Teaching Service

## 2024-08-22 NOTE — Evaluation (Signed)
 Physical Therapy Brief Evaluation and Discharge Note Patient Details Name: Shelby Brown MRN: [id number] DOB: Jan 12, 1981 Today's Date: 08/22/2024   History of Present Illness  43 yo female admitted on 08/21/24 with flank pain concern for pyelonephritis from CT results. PMH 12/25/23 pyelonephritis. 4/10 experienced  left eye vision problems and left hemibody numbness, Code stroke was activated DM 2, depression, normocytic anemia recurrent UTI  Clinical Impression  Pt resting in bed, spouse at bedside. Pt IND with bed mobility, transfers, and ambulation 269ft IND with support on IV pole. Pain continues to be a limiting factor resulting in decreased gait speed and distances, however patient enjoys being able to get up and walk as she can tolerate. Pt is at/near her functional baseline and does not require additional PT services while admitted for following discharge. PT will sign off at this time. Please re-consult if there is a change in medical status.        PT Assessment Patient does not need any further PT services  Assistance Needed at Discharge  Frequent or constant Supervision/Assistance    Equipment Recommendations Rolling walker (2 wheels) (pt requests RW due for stability when walking in setting of increased pain, pt enjoys walking but pain can limite her from times and feels safer when able to support self on device)  Recommendations for Other Services       Precautions/Restrictions Precautions Precautions: None Restrictions Weight Bearing Restrictions Per Provider Order: No        Mobility  Bed Mobility Rolling: Independent Supine/Sidelying to sit: Independent Sit to supine/sidelying: Independent    Transfers Overall transfer level: Independent Equipment used: None                    Ambulation/Gait Ambulation/Gait assistance: Modified independent (Device/Increase time) Gait Distance (Feet): 200 Feet Assistive device: IV Pole   Gait Speed: Below  normal General Gait Details: slow gait speed secondary to pain and management of IV pole  Home Activity Instructions    Stairs            Modified Rankin (Stroke Patients Only)        Balance Overall balance assessment: Independent                        Pertinent Vitals/Pain   Pain Assessment Pain Assessment: Faces Faces Pain Scale: Hurts even more Pain Location: abdomen and pelvis Pain Descriptors / Indicators: Grimacing, Guarding, Sharp Pain Intervention(s): Limited activity within patient's tolerance, Monitored during session     Home Living Family/patient expects to be discharged to:: Private residence Living Arrangements: Spouse/significant other;Children Available Help at Discharge: Family Home Environment: Stairs to enter  Landscape Architect of Steps: 3 Home Equipment: None   Additional Comments: Does not drive    Prior Function Level of Independence: Independent      UE/LE Assessment   UE ROM/Strength/Tone/Coordination: WFL    LE ROM/Strength/Tone/Coordination: [facility]      Communication   Communication Communication: No apparent difficulties Factors Affecting Communication: Other (comment) (non-english speaking, spanish interpreter Aurea [phone]) used to aid in communication)     Cognition Overall Cognitive Status: Appears within functional limits for tasks assessed/performed       General Comments      Exercises     Assessment/Plan    PT Problem List         PT Visit Diagnosis      No Skilled PT Patient at baseline level of functioning   Co-evaluation  AMPAC 6 Clicks Help needed turning from your back to your side while in a flat bed without using bedrails?: None Help needed moving from lying on your back to sitting on the side of a flat bed without using bedrails?: None Help needed moving to and from a bed to a chair (including a wheelchair)?: None Help needed standing up from a chair using your  arms (e.g., wheelchair or bedside chair)?: None Help needed to walk in hospital room?: None Help needed climbing 3-5 steps with a railing? : A Little 6 Click Score: 23      End of Session Equipment Utilized During Treatment: Gait belt Activity Tolerance: Patient tolerated treatment well Patient left: in bed;with family/visitor present         Time: 8562-8542 PT Time Calculation (min) (ACUTE ONLY): 20 min  Charges:   PT Evaluation $PT Eval Low Complexity: 1 Low      Yashas Camilli H. Yaneth Fairbairn, PT, DPT   [employer]  08/22/2024, 3:17 PM

## 2024-08-22 NOTE — Assessment & Plan Note (Addendum)
 History of recurrent UTIs, occasionally with pyelo-, given recent hospitalization some concern for MDR strains but previous culture supports that sensitivity to CTX S/p ceftriaxone  2 g 12/4, will redose ctx today and transition to PO tomorrow Start methenamine after antibiotic course Outpatient urology consult Urine culture pending I's and O's: 900 cc urine output this morning Renal ultrasound found no hydro or bladder distention Pain: tylenol  1000mg  q6h prn A.m. labs: CBC, BMP

## 2024-08-22 NOTE — Assessment & Plan Note (Addendum)
 Hgb down to 8.6 from 9.6, recent labs baseline appears to be around 9.5-10, previous iron studies had mixed ACD/IDA picture previous ferritin lowest 22, iron level 80's.  Reticulocyte 2 months ago 0.4%, possible GI loss.  Symptomatic? Restart p.o. ferrous sulfate  325 mg every other day.

## 2024-08-23 ENCOUNTER — Other Ambulatory Visit (HOSPITAL_COMMUNITY): Payer: Self-pay

## 2024-08-23 DIAGNOSIS — N3000 Acute cystitis without hematuria: Secondary | ICD-10-CM

## 2024-08-23 LAB — CBC
HCT: 27.9 % — ABNORMAL LOW (ref 36.0–46.0)
Hemoglobin: 9 g/dL — ABNORMAL LOW (ref 12.0–15.0)
MCH: 26.8 pg (ref 26.0–34.0)
MCHC: 32.3 g/dL (ref 30.0–36.0)
MCV: 83 fL (ref 80.0–100.0)
Platelets: 319 K/uL (ref 150–400)
RBC: 3.36 MIL/uL — ABNORMAL LOW (ref 3.87–5.11)
RDW: 14.7 % (ref 11.5–15.5)
WBC: 6 K/uL (ref 4.0–10.5)
nRBC: 0 % (ref 0.0–0.2)

## 2024-08-23 LAB — BASIC METABOLIC PANEL WITH GFR
Anion gap: 7 (ref 5–15)
BUN: 17 mg/dL (ref 6–20)
CO2: 25 mmol/L (ref 22–32)
Calcium: 8.1 mg/dL — ABNORMAL LOW (ref 8.9–10.3)
Chloride: 103 mmol/L (ref 98–111)
Creatinine, Ser: 0.8 mg/dL (ref 0.44–1.00)
GFR, Estimated: >60 mL/min (ref >60)
Glucose, Bld: 209 mg/dL — ABNORMAL HIGH (ref 70–99)
Potassium: 4.1 mmol/L (ref 3.5–5.1)
Sodium: 135 mmol/L (ref 135–145)

## 2024-08-23 LAB — GLUCOSE, CAPILLARY
Glucose-Capillary: 195 mg/dL — ABNORMAL HIGH (ref 70–99)
Glucose-Capillary: 118 mg/dL — ABNORMAL HIGH (ref 70–99)

## 2024-08-23 MED ORDER — CEPHALEXIN 500 MG PO CAPS
500.0000 mg | ORAL_CAPSULE | Freq: Two times a day (BID) | ORAL | 0 refills | Status: AC
  Filled 2024-08-23: qty 5, 3d supply, fill #0

## 2024-08-23 MED ORDER — ONDANSETRON 4 MG PO TBDP
4.0000 mg | ORAL_TABLET | Freq: Three times a day (TID) | ORAL | 0 refills | Status: AC | PRN
  Filled 2024-08-23: qty 20, 7d supply, fill #0

## 2024-08-23 NOTE — Assessment & Plan Note (Deleted)
 MDD: Not currently on any medication, will plan to discuss in further evaluate for possible restart of SSRI Hyperlipidemia: Atorvastatin  20 mg daily

## 2024-08-23 NOTE — TOC Transition Note (Addendum)
 Transition of Care [facility]) - Discharge Note   Patient Details  Name: Shelby Brown MRN: [id number] Date of Birth: 05-13-1981  [facility] CM/SW Contact:  Tom-Johnson, Harvest Muskrat, RN Phone Number: 08/23/2024, 10:24 AM   Clinical Narrative:     Patient is scheduled for discharge today.  Readmission Risk Assessment done. Outpatient f/u, hospital f/u and discharge instructions on AVS. Prescriptions sent to [facility] pharmacy and patient will receive meds prior discharge. No PT/OT f/u, no ICM needs or recommendations noted. Daughter, [name] to transport at discharge.  No further ICM needs noted.  11:00- RW ordered from [employer] and it will be delivered to her home.     Final next level of care: Home/Self Care Barriers to Discharge: Barriers Resolved   Patient Goals and CMS Choice Patient states their goals for this hospitalization and ongoing recovery are:: To return home CMS Medicare.gov Compare Post Acute Care list provided to:: Patient Choice offered to / list presented to : NA      Discharge Placement                Patient to be transferred to facility by: Daughter Name of family member notified: [name]    Discharge Plan and Services Additional resources added to the After Visit Summary for                                       Social Drivers of Health (SDOH) Interventions SDOH Screenings   Food Insecurity: Food Insecurity Present (08/21/2024)  Housing: High Risk (08/21/2024)  Transportation Needs: Unmet Transportation Needs (08/21/2024)  Utilities: At Risk (08/21/2024)  Alcohol Screen: Low Risk  (11/01/2017)  Depression (PHQ2-9): High Risk (07/29/2024)  Tobacco Use: Low Risk  (08/21/2024)     Readmission Risk Interventions    08/23/2024   10:23 AM 12/28/2023    3:39 PM  Readmission Risk Prevention Plan  Post Dischage Appt  Complete  Medication Screening  Complete  Transportation Screening Complete Complete  PCP or Specialist  Appt within 5-7 Days Complete   Home Care Screening Complete   Medication Review (RN CM) Referral to Pharmacy

## 2024-08-23 NOTE — Assessment & Plan Note (Deleted)
 Endorsed worsening burning-like pain of thigh overnight, appears to have improved with cream overnight Continue topical hydrocortisone  cream 1%

## 2024-08-23 NOTE — Evaluation (Signed)
 Occupational Therapy Evaluation and Discharge Patient Details Name: Rola Lennon MRN: [id number] DOB: [birth year] Today's Date: 08/23/2024   History of Present Illness   43 yo female admitted on 08/21/24 with flank pain concern for pyelonephritis from CT results. PMH 12/25/23 pyelonephritis. 4/10 experienced  left eye vision problems and left hemibody numbness, Code stroke was activated DM 2, depression, normocytic anemia recurrent UTI     Clinical Impressions At baseline, pt is Ind to Mod I with ADLs, IADLs, and mobility without an AD. Pt receives assistance of family for transportation. Pt now presents at or within 95% of baseline PLOF with pt completing ADLs Ind to Mod I. OT recommends Supervision for shower due to pt reporting frequent intermittent dizziness during positional changes. Pt reports she spends a lot of time in bed at home due to pain and fatigue and reports frequent intermittent dizziness, especially when transferring supine to sit and sit to stand. OT educated pt and husband in strategies for assisting in decreasing dizziness and stabilizing BP with positional changes, including staying hydrated, participation in regular daily movement/exercise, light exercises prior to sitting/standing, and use of compression socks when OOB with pt and husband demonstrating understanding of all education through teach back. Pt also educated in AROM exercises that can be done from supine or sitting with pt demonstrating understanding through teach back. VSS on RA with pt reporting mild intermittent dizziness during session. Pt would benefit from a RW for increased stability, safety, and comfort with functional mobility. All education complete and no further acute OT services are indicated. No post acute skilled OT needs are anticipated. OT is signing off at this time.      If plan is discharge home, recommend the following:   A little help with bathing/dressing/bathroom;Assistance with  cooking/housework;Assist for transportation;Help with stairs or ramp for entrance     Functional Status Assessment   Patient has not had a recent decline in their functional status     Equipment Recommendations   Other (comment) (RW)     Recommendations for Other Services         Precautions/Restrictions   Precautions Precautions: None Restrictions Weight Bearing Restrictions Per Provider Order: No     Mobility Bed Mobility Overal bed mobility: Independent                  Transfers Overall transfer level: Independent Equipment used: None                      Balance Overall balance assessment: Mild deficits observed, not formally tested (Pt would benefit from RW for increased stability and increased comfort/confidence given frequent dizziness with positional changes.)                                         ADL either performed or assessed with clinical judgement   ADL Overall ADL's : Modified independent;At baseline;Independent                                       General ADL Comments: Pt at or near baseline with pt currently performing ADLs Ind to Mod I. Recommend Supervision during showers for safety due to pt report of frequent increased dizziness upon standing.     Vision Patient Visual Report: No change from baseline  Perception         Praxis         Pertinent Vitals/Pain Pain Assessment Pain Assessment: 0-10 Faces Pain Scale: Hurts even more Pain Location: abdomen and pelvis Pain Descriptors / Indicators: Grimacing, Guarding, Sharp, Discomfort Pain Intervention(s): Limited activity within patient's tolerance, Monitored during session, Repositioned     Extremity/Trunk Assessment Upper Extremity Assessment Upper Extremity Assessment: Generalized weakness   Lower Extremity Assessment Lower Extremity Assessment: Defer to PT evaluation   Cervical / Trunk Assessment Cervical /  Trunk Assessment: Normal   Communication Communication Communication: No apparent difficulties Factors Affecting Communication: Other (comment) (non-english speaking, spanish interpreter Aurea [phone]) used to aid in communication)   Cognition Arousal: Alert Behavior During Therapy: WFL for tasks assessed/performed Cognition: No apparent impairments             OT - Cognition Comments: AAOx4 and pleasant throughout session. Cognition WFL for tasks assessed.                 Following commands: Intact       Cueing  General Comments   Cueing Techniques: Verbal cues;Gestural cues;Visual cues  Pt and husband educated in strategies for assisting in decreasing dizziness and stabilizing BP with positional changes, including staying hydrated, participation in regular exercise and light exercises prior to sitting/standing, and use of compression socks when OOB with pt and husband demonstrating understanding of all education through teah back. with postural changes, including Pt's husband present and supportive throughout session. VSS on RA. Video Interpreter assisting throughout session Charissa, ID: [phone]).   Exercises Exercises: Other exercises Other Exercises Other Exercises: Pt educated in importance of frequent daily movement for general health, endurance, and to decreased risk of dizziness with positional changes. Pt also educated in performing light exercises in supine and sitting prior to stransferring supine to sit and sit to stand to assist in decreasing dizziness with positional changes. Pt instructed in AROM shoulder flexion, chest press, elbow flexion, leg lifts, foot slides, and marching with pt demonstrating undersanding through teach back.   Shoulder Instructions      Home Living Family/patient expects to be discharged to:: Private residence Living Arrangements: Spouse/significant other;Children Available Help at Discharge: Family;Available PRN/intermittently  (husband and 2 kids work and younger child is in school) Type of Home: House Home Access: Stairs to enter Secretary/administrator of Steps: 2   Home Layout: One level     Bathroom Shower/Tub: Producer, Television/film/video: Standard     Home Equipment: None   Additional Comments: Does not drive      Prior Functioning/Environment Prior Level of Function : Independent/Modified Independent             Mobility Comments: ambulating without AD ADLs Comments: Ind to Mod I with ADLs and IADLs; reports she spends a lot of time in bed due to pain and fatigue    OT Problem List: Decreased activity tolerance;Other (comment) (frequent intermittent dizziness)   OT Treatment/Interventions:        OT Goals(Current goals can be found in the care plan section)   Acute Rehab OT Goals Patient Stated Goal: to return home and not have as much intermittent dizziness OT Goal Formulation: All assessment and education complete, DC therapy Time For Goal Achievement: 08/23/24 Potential to Achieve Goals: Good   OT Frequency:       Co-evaluation              AM-PAC OT 6 Clicks Daily Activity  Outcome Measure Help from another person eating meals?: None Help from another person taking care of personal grooming?: None Help from another person toileting, which includes using toliet, bedpan, or urinal?: None Help from another person bathing (including washing, rinsing, drying)?: A Little (Supervision for safety) Help from another person to put on and taking off regular upper body clothing?: None Help from another person to put on and taking off regular lower body clothing?: None 6 Click Score: 23   End of Session Nurse Communication: Mobility status;Other (comment) (OT educated pt in strateiges for decreasing/managing dizziness upon sitting/standing. Recommending a RW. No OT foole. Pt has questions regarding medication.)  Activity Tolerance: Patient tolerated treatment  well Patient left: in bed;with call bell/phone within reach;with family/visitor present  OT Visit Diagnosis: Other (comment) (decreased activity tolerance)                Time: 9050-8993 OT Time Calculation (min): 17 min Charges:  OT General Charges $OT Visit: 1 Visit OT Evaluation $OT Eval Low Complexity: 1 Low  Margarie Rockey HERO., OTR/L, MA Acute Rehab [phone]   Margarie FORBES Horns 08/23/2024, 10:30 AM

## 2024-08-23 NOTE — Assessment & Plan Note (Deleted)
 Endorse vaginal itchiness at PCP visit yesterday, did not receive Diflucan  yesterday which has helped previously. -150 mg p.o. fluconazole  today

## 2024-08-23 NOTE — Discharge Summary (Signed)
 Family Medicine Teaching [facility] Discharge Summary  Patient name: Matalyn Nawaz Medical record number: [id number] Date of birth: Aug 02, 1981 Age: 43 y.o. Gender: female Date of Admission: 08/21/2024  Date of Discharge: 08/23/24  Admitting Physician: Krystal BIRCH McDiarmid, MD  Primary Care Provider: Adele Song, MD Consultants: None  Indication for Hospitalization: Hypotension and flank pain  Discharge Diagnoses/Problem List:  Principal Problem for Admission: UTI Other Problems addressed during stay:  Principal Problem:   Flank pain Active Problems:   Type 2 diabetes mellitus with hyperglycemia (HCC)   Chronic health problem   Normocytic anemia   Recurrent pyelonephritis   Vaginal itching   Leg pain, anterior  Brief Hospital Course:  Gerlean Cid is a 43 y.o.female with a history of recurrent UTIs with pyelonephritis, T2DM who was admitted to the family medicine teaching Service at [facility] for UTI with poor p.o. Her hospital course is detailed below:  Uncomplicated interstitial cystitis ?Pyelonephritis Presenting with worsening suprapubic pain and discomfort, persistent low appetite and softer blood pressures in clinic. In the ED vital signs were stable, UA and microscopy consistent with UTI, patient was started on ceftriaxone .  Urine culture showed gram-negative rods, sensitivities had not resulted prior to discharge.  Renal ultrasound was completed due to patient having multiple CT A/P previously, which was negative for pyelonephritis.  Patient was placed on continuous fluids due to poor PO which were discontinued prior to discharge once patient was drinking more fluids. 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  Normocytic anemia Likely component to patient's general malaise, difficult to assess  baseline given frequent hospitalization appears to be around 10.  Previous hospitalizations found likely multifactorial contribution anemia chronic disease with IDA, patient not taking p.o. iron supplements. Iron supplementation was restarted during this hospitalization.   Vaginal itching Patient presented to clinic with complaints of vaginal itching. GU exam not done during admission, but did treat with 1 dose fluconazole  .  Other chronic conditions were medically managed with home medications and formulary alternatives as necessary (T2DM, hyperlipidemia, MDD  PCP Follow-up Recommendations: Considering started Methenamine for UTI prophylaxis after ABX course Urine culture pending, if not sensitive to cephalosporins, treat with other antibiotic Encourage starting SSRI outpatient for depression OB and urology outpatient follow-up Consider switching statin if still having myalgias Consider restarting gabapentin  and famotidine , which were held on last admission     Results/Tests Pending at Time of Discharge:  Urine culture sensitivities     Disposition: Home  Discharge Condition:  Stable  Discharge Exam:  Vitals:   08/23/24 0412 08/23/24 0716  BP: (!) 142/92 137/81  Pulse: 73 77  Resp: 20 18  Temp: 98.7 F (37.1 C) 98.8 F (37.1 C)  SpO2: 100% 100%   General: A&O, NAD HEENT: No sign of trauma, EOM grossly intact Respiratory: normal WOB GI: non-distended  Extremities: no peripheral edema. Neuro: Normal gait, moves all four extremities appropriately Skin: no lesions/rashes visualized Psych: Appropriate mood and affect   Significant Procedures: None  Significant Labs and Imaging:  Recent Labs  Lab 08/21/24 1146 08/22/24 0525 08/23/24 0611  WBC 9.5 6.9 6.0  HGB 9.6* 8.6* 9.0*  HCT 30.7* 26.6* 27.9*  PLT 313 295 319   Recent Labs  Lab 08/21/24 1146 08/22/24 0525 08/23/24 0611  NA 134* 138 135  K 4.6 4.1 4.1  CL 105 104 103  CO2  19* 23 25  GLUCOSE 183*  126* 209*  BUN 16 17 17   CREATININE 1.16* 0.93 0.80  CALCIUM  8.6* 8.0* 8.1*  ALKPHOS 59  --   --   AST 22  --   --   ALT 16  --   --   ALBUMIN 2.9*  --   --    Urinalysis    Component Value Date/Time   COLORURINE YELLOW 08/21/2024 1136   APPEARANCEUR CLOUDY (A) 08/21/2024 1136   APPEARANCEUR Clear 12/31/2023 1448   LABSPEC 1.025 08/21/2024 1136   PHURINE 5.5 08/21/2024 1136   GLUCOSEU NEGATIVE 08/21/2024 1136   GLUCOSEU > 1000 mg/dL (A) 96/75/7990 7855   HGBUR SMALL (A) 08/21/2024 1136   BILIRUBINUR SMALL (A) 08/21/2024 1136   BILIRUBINUR negative 07/24/2024 0955   BILIRUBINUR Negative 12/31/2023 1448   KETONESUR NEGATIVE 08/21/2024 1136   PROTEINUR 100 (A) 08/21/2024 1136   UROBILINOGEN 0.2 07/24/2024 0955   UROBILINOGEN 0.2 11/28/2014 1725   NITRITE NEGATIVE 08/21/2024 1136   LEUKOCYTESUR MODERATE (A) 08/21/2024 1136      Pertinent Imaging    US  renal: IMPRESSION: 1. No hydronephrosis or renal fluid collection. 2. Nondistended urinary bladder. Bladder wall thickening versus nondistention.  Discharge Medications:  Allergies as of 08/23/2024       Reactions   Metformin  And Related Nausea Only   Poor PO intake and treated with prescription antiemetic agents        Medication List     PAUSE taking these medications    famotidine  20 MG tablet Wait to take this until your doctor or other care provider tells you to start again. Commonly known as: PEPCID  Tome 1 tableta (20 mg en total) por va oral diariamente. (Take 1 tablet (20 mg total) by mouth daily.)   gabapentin  100 MG capsule Wait to take this until your doctor or other care provider tells you to start again. Commonly known as: NEURONTIN  Tomar 1 cpsula (100 mg en total) por va oral 3 (tres) veces al [facility] [employer]. (Take 1 capsule (100 mg total) by mouth 3 (three) times daily.)       STOP taking these medications    fluconazole  150 MG tablet Commonly known as: DIFLUCAN        TAKE these medications     acetaminophen  500 MG tablet Commonly known as: TYLENOL  Take 2 tablets (1,000 mg total) by mouth every 6 (six) hours as needed (first line pain).   atorvastatin  20 MG tablet Commonly known as: LIPITOR Take 1 tablet (20 mg total) by mouth daily.   Basaglar  KwikPen 100 UNIT/ML Inject 15 Units into the skin daily.   cephALEXin  500 MG capsule Commonly known as: KEFLEX  Take 1 capsule (500 mg total) by mouth in the morning and at bedtime for 3 days.   FeroSul 325 (65 Fe) MG tablet Generic drug: ferrous sulfate  Tome 1 tableta (325 mg en total) por va oral [employer]. (Take 1 tablet (325 mg total) by mouth every other day.)   insulin  lispro 100 UNIT/ML KwikPen Commonly known as: HumaLOG  KwikPen Inject 4-5 Units into the skin daily with supper.   ondansetron  4 MG disintegrating tablet Commonly known as: ZOFRAN -ODT Take 1 tablet (4 mg total) by mouth every 8 (eight) hours as needed for nausea or vomiting.   TRUEplus 5-Bevel Pen Needles 31G X 8 MM Misc Generic drug: Insulin  Pen Needle Use one pen needle twice daily.        Discharge Instructions: Please refer to Patient Instructions section of  EMR for full details.  Patient was counseled important signs and symptoms that should prompt return to medical care, changes in medications, dietary instructions, activity restrictions, and follow up appointments.   Follow-Up Appointments:  Future Appointments  Date Time Provider Department Center  08/25/2024  2:10 PM Adele Song, MD [facility]  09/04/2024  9:00 AM Amalia Maude MATSU, RPH-CPP [facility]     Lonnie Kayah Hecker, MD 08/23/2024, 10:25 AM PGY-2, [facility]

## 2024-08-23 NOTE — Assessment & Plan Note (Deleted)
 Hgb down to 8.6 from 9.6, recent labs baseline appears to be around 9.5-10, previous iron studies had mixed ACD/IDA picture previous ferritin lowest 22, iron level 80's.  Reticulocyte 2 months ago 0.4%, possible GI loss.  Symptomatic? Restart p.o. ferrous sulfate  325 mg every other day.

## 2024-08-23 NOTE — Assessment & Plan Note (Deleted)
 CBGs at goal overnight, received 2 units sliding scale.  Home regimen 15 units glargine a.m. 5 units at night Moderate SSI Continue to hold additional insulin , and add back 10 units glargine daily once better p.o.

## 2024-08-23 NOTE — Discharge Instructions (Addendum)
 Estimada Shelby Brown:  Gracias por permitirnos participar en su atencin. Estuvo hospitalizada por presin arterial baja y dolor de espalda, y se le diagnostic una infeccin del tracto urinario. Recibi tratamiento con lquidos y antibiticos. Por favor, complete su tratamiento con antibiticos y acuda a su mdico de cabecera para recibir seguimiento. Tambin acuda a su urlogo, segn lo indicado por su mdico de cabecera en su ltima visita. Hable sobre urologa y la posibilidad de ver al gineclogo con su mdico de cabecera en su cita de seguimiento. Esta visita ya est programada y se encuentra a continuacin.  INSTRUCCIONES PARA EL CUIDADO Y DESPUS DE LA HOSPITALIZACIN 1. Complete su tratamiento con antibiticos. 2. Asista a sus citas de seguimiento (enumeradas a continuacin).  [employer] y [employer] vaya bien!  [facility] [address] [place], [place] [address] [phone]  Dear Shelby Brown,  Thank you for letting us  participate in your care. You were hospitalized for low blood pressures and back pain and diagnosed with a urinary tract infection. You were treated with fluids and antibiotics. Please complete your full course of antibiotics and follow up with your PCP. Also follow up with your urologist as stated by your PCP at the last visit. Discuss urology and seeing the gynecologist with your primary doctor at your follow up. This visit has been scheduled and is below.   Future Appointments  Date Time Provider Department Center  08/25/2024  2:10 PM Shelby Song, MD [facility]  09/04/2024  9:00 AM Amalia Maude MATSU, RPH-CPP [facility]    POST-HOSPITAL & CARE INSTRUCTIONS Complete your course of antibiotics Go to your follow up appointments (listed below)   DOCTOR'S APPOINTMENT   Future Appointments  Date Time Provider Department Center  08/25/2024  2:10 PM  Shelby Song, MD [facility]  09/04/2024  9:00 AM Koval, Peter G, RPH-CPP [facility]     Take care and be well!  Family Medicine Teaching Service Inpatient Team [place]  [facility]  [address] [place], [place] [address] [phone]

## 2024-08-23 NOTE — Assessment & Plan Note (Deleted)
 History of recurrent UTIs, occasionally with pyelo-, given recent hospitalization some concern for MDR strains but previous culture supports that sensitivity to CTX S/p ceftriaxone  2 g 12/4, will redose ctx today and transition to PO tomorrow Start methenamine after antibiotic course Outpatient urology consult Urine culture pending I's and O's: 900 cc urine output this morning Renal ultrasound found no hydro or bladder distention Pain: tylenol  1000mg  q6h prn A.m. labs: CBC, BMP

## 2024-08-23 NOTE — Progress Notes (Signed)
 RN discussed discharge with patient and answered all questions. Pt to follow up Monday with MD about recurring diarrhea. IV removed. Pt to go home with husband.

## 2024-08-24 LAB — URINE CULTURE: Culture: >=100000 — AB

## 2024-08-25 ENCOUNTER — Ambulatory Visit: Payer: Self-pay | Admitting: Family Medicine

## 2024-08-25 ENCOUNTER — Inpatient Hospital Stay: Payer: Self-pay | Admitting: Family Medicine

## 2024-08-26 ENCOUNTER — Inpatient Hospital Stay: Payer: Self-pay | Admitting: Family Medicine

## 2024-08-26 LAB — CULTURE, BLOOD (SINGLE)
Culture: NO GROWTH
Special Requests: ADEQUATE

## 2024-09-02 ENCOUNTER — Inpatient Hospital Stay (HOSPITAL_COMMUNITY): Admission: EM | Admit: 2024-09-02 | Discharge: 2024-09-06 | DRG: 312 | Disposition: A | Payer: Self-pay

## 2024-09-02 ENCOUNTER — Encounter: Payer: Self-pay | Admitting: Family Medicine

## 2024-09-02 ENCOUNTER — Other Ambulatory Visit: Payer: Self-pay

## 2024-09-02 ENCOUNTER — Ambulatory Visit: Payer: Self-pay | Admitting: Family Medicine

## 2024-09-02 VITALS — BP 64/53 | HR 86 | Wt 112.0 lb

## 2024-09-02 DIAGNOSIS — E785 Hyperlipidemia, unspecified: Secondary | ICD-10-CM | POA: Diagnosis present

## 2024-09-02 DIAGNOSIS — E860 Dehydration: Principal | ICD-10-CM | POA: Diagnosis present

## 2024-09-02 DIAGNOSIS — I951 Orthostatic hypotension: Secondary | ICD-10-CM

## 2024-09-02 DIAGNOSIS — D123 Benign neoplasm of transverse colon: Secondary | ICD-10-CM | POA: Diagnosis present

## 2024-09-02 DIAGNOSIS — D509 Iron deficiency anemia, unspecified: Secondary | ICD-10-CM | POA: Diagnosis present

## 2024-09-02 DIAGNOSIS — Z5941 Food insecurity: Secondary | ICD-10-CM

## 2024-09-02 DIAGNOSIS — R079 Chest pain, unspecified: Secondary | ICD-10-CM | POA: Diagnosis present

## 2024-09-02 DIAGNOSIS — E1143 Type 2 diabetes mellitus with diabetic autonomic (poly)neuropathy: Secondary | ICD-10-CM | POA: Diagnosis present

## 2024-09-02 DIAGNOSIS — R200 Anesthesia of skin: Secondary | ICD-10-CM | POA: Diagnosis not present

## 2024-09-02 DIAGNOSIS — Z789 Other specified health status: Secondary | ICD-10-CM

## 2024-09-02 DIAGNOSIS — Z8616 Personal history of COVID-19: Secondary | ICD-10-CM

## 2024-09-02 DIAGNOSIS — I959 Hypotension, unspecified: Secondary | ICD-10-CM

## 2024-09-02 DIAGNOSIS — K529 Noninfective gastroenteritis and colitis, unspecified: Secondary | ICD-10-CM | POA: Diagnosis present

## 2024-09-02 DIAGNOSIS — Z5982 Transportation insecurity: Secondary | ICD-10-CM

## 2024-09-02 DIAGNOSIS — M940 Chondrocostal junction syndrome [Tietze]: Secondary | ICD-10-CM | POA: Diagnosis not present

## 2024-09-02 DIAGNOSIS — N12 Tubulo-interstitial nephritis, not specified as acute or chronic: Secondary | ICD-10-CM | POA: Diagnosis present

## 2024-09-02 DIAGNOSIS — Z5989 Other problems related to housing and economic circumstances: Secondary | ICD-10-CM

## 2024-09-02 DIAGNOSIS — Z794 Long term (current) use of insulin: Secondary | ICD-10-CM

## 2024-09-02 DIAGNOSIS — Z833 Family history of diabetes mellitus: Secondary | ICD-10-CM

## 2024-09-02 DIAGNOSIS — Z603 Acculturation difficulty: Secondary | ICD-10-CM | POA: Diagnosis present

## 2024-09-02 DIAGNOSIS — Z79899 Other long term (current) drug therapy: Secondary | ICD-10-CM

## 2024-09-02 DIAGNOSIS — Z87440 Personal history of urinary (tract) infections: Secondary | ICD-10-CM

## 2024-09-02 DIAGNOSIS — K5900 Constipation, unspecified: Secondary | ICD-10-CM | POA: Diagnosis present

## 2024-09-02 DIAGNOSIS — F32A Depression, unspecified: Secondary | ICD-10-CM | POA: Diagnosis present

## 2024-09-02 DIAGNOSIS — D122 Benign neoplasm of ascending colon: Secondary | ICD-10-CM | POA: Diagnosis present

## 2024-09-02 LAB — COMPREHENSIVE METABOLIC PANEL WITH GFR
ALT: 11 U/L (ref 0–44)
AST: 16 U/L (ref 15–41)
Albumin: 3.1 g/dL — ABNORMAL LOW (ref 3.5–5.0)
Alkaline Phosphatase: 48 U/L (ref 38–126)
Anion gap: 8 (ref 5–15)
BUN: 22 mg/dL — ABNORMAL HIGH (ref 6–20)
CO2: 20 mmol/L — ABNORMAL LOW (ref 22–32)
Calcium: 8 mg/dL — ABNORMAL LOW (ref 8.9–10.3)
Chloride: 112 mmol/L — ABNORMAL HIGH (ref 98–111)
Creatinine, Ser: 0.78 mg/dL (ref 0.44–1.00)
GFR, Estimated: >60 mL/min (ref >60)
Glucose, Bld: 84 mg/dL (ref 70–99)
Potassium: 4.2 mmol/L (ref 3.5–5.1)
Sodium: 140 mmol/L (ref 135–145)
Total Bilirubin: 0.3 mg/dL (ref 0.0–1.2)
Total Protein: 5.7 g/dL — ABNORMAL LOW (ref 6.5–8.1)

## 2024-09-02 LAB — CBC WITH DIFFERENTIAL/PLATELET
Abs Immature Granulocytes: 0.03 K/uL (ref 0.00–0.07)
Basophils Absolute: 0.1 K/uL (ref 0.0–0.1)
Basophils Relative: 1 %
Eosinophils Absolute: 0.1 K/uL (ref 0.0–0.5)
Eosinophils Relative: 2 %
HCT: 28.1 % — ABNORMAL LOW (ref 36.0–46.0)
Hemoglobin: 8.7 g/dL — ABNORMAL LOW (ref 12.0–15.0)
Immature Granulocytes: 0 %
Lymphocytes Relative: 27 %
Lymphs Abs: 2.1 K/uL (ref 0.7–4.0)
MCH: 26.9 pg (ref 26.0–34.0)
MCHC: 31 g/dL (ref 30.0–36.0)
MCV: 86.7 fL (ref 80.0–100.0)
Monocytes Absolute: 0.5 K/uL (ref 0.1–1.0)
Monocytes Relative: 6 %
Neutro Abs: 4.8 K/uL (ref 1.7–7.7)
Neutrophils Relative %: 64 %
Platelets: 393 K/uL (ref 150–400)
RBC: 3.24 MIL/uL — ABNORMAL LOW (ref 3.87–5.11)
RDW: 14.7 % (ref 11.5–15.5)
WBC: 7.5 K/uL (ref 4.0–10.5)
nRBC: 0 % (ref 0.0–0.2)

## 2024-09-02 LAB — URINALYSIS, MICROSCOPIC (REFLEX): WBC, UA: >50 WBC/hpf (ref 0–5)

## 2024-09-02 LAB — I-STAT CHEM 8, ED
BUN: 23 mg/dL — ABNORMAL HIGH (ref 6–20)
Calcium, Ion: 1.16 mmol/L (ref 1.15–1.40)
Chloride: 110 mmol/L (ref 98–111)
Creatinine, Ser: 0.9 mg/dL (ref 0.44–1.00)
Glucose, Bld: 87 mg/dL (ref 70–99)
HCT: 27 % — ABNORMAL LOW (ref 36.0–46.0)
Hemoglobin: 9.2 g/dL — ABNORMAL LOW (ref 12.0–15.0)
Potassium: 4.5 mmol/L (ref 3.5–5.1)
Sodium: 141 mmol/L (ref 135–145)
TCO2: 20 mmol/L — ABNORMAL LOW (ref 22–32)

## 2024-09-02 LAB — URINALYSIS, ROUTINE W REFLEX MICROSCOPIC
Bilirubin Urine: NEGATIVE
Glucose, UA: NEGATIVE mg/dL
Ketones, ur: NEGATIVE mg/dL
Nitrite: NEGATIVE
Protein, ur: NEGATIVE mg/dL
Specific Gravity, Urine: 1.015 (ref 1.005–1.030)
pH: 5.5 (ref 5.0–8.0)

## 2024-09-02 LAB — CBG MONITORING, ED: Glucose-Capillary: 86 mg/dL (ref 70–99)

## 2024-09-02 LAB — I-STAT CG4 LACTIC ACID, ED: Lactic Acid, Venous: 0.5 mmol/L (ref 0.5–1.9)

## 2024-09-02 MED ORDER — FERROUS SULFATE 325 (65 FE) MG PO TABS
325.0000 mg | ORAL_TABLET | ORAL | Status: DC
  Administered 2024-09-02 – 2024-09-06 (×2): 325 mg via ORAL
  Filled 2024-09-02 (×3): qty 1

## 2024-09-02 MED ORDER — SODIUM CHLORIDE 0.9 % IV SOLN: INTRAVENOUS | Status: AC

## 2024-09-02 MED ORDER — ACETAMINOPHEN 500 MG PO TABS
1000.0000 mg | ORAL_TABLET | Freq: Four times a day (QID) | ORAL | Status: DC | PRN
  Administered 2024-09-02 – 2024-09-06 (×6): 1000 mg via ORAL
  Filled 2024-09-02 (×7): qty 2

## 2024-09-02 MED ORDER — ATORVASTATIN CALCIUM 10 MG PO TABS
20.0000 mg | ORAL_TABLET | Freq: Every day | ORAL | Status: DC
  Administered 2024-09-02 – 2024-09-06 (×3): 20 mg via ORAL
  Filled 2024-09-02 (×7): qty 2

## 2024-09-02 MED ORDER — INSULIN ASPART 100 UNIT/ML IJ SOLN
0.0000 [IU] | Freq: Three times a day (TID) | INTRAMUSCULAR | Status: DC
  Administered 2024-09-03 (×2): 1 [IU] via SUBCUTANEOUS
  Administered 2024-09-04: 17:00:00 2 [IU] via SUBCUTANEOUS
  Administered 2024-09-05: 1 [IU] via SUBCUTANEOUS
  Administered 2024-09-05: 3 [IU] via SUBCUTANEOUS
  Administered 2024-09-06: 1 [IU] via SUBCUTANEOUS
  Filled 2024-09-02: qty 2
  Filled 2024-09-02: qty 1
  Filled 2024-09-02: qty 3
  Filled 2024-09-02 (×3): qty 1

## 2024-09-02 MED ORDER — ENOXAPARIN SODIUM 40 MG/0.4ML IJ SOSY
40.0000 mg | PREFILLED_SYRINGE | INTRAMUSCULAR | Status: DC
  Administered 2024-09-02 – 2024-09-05 (×4): 40 mg via SUBCUTANEOUS
  Filled 2024-09-02 (×4): qty 0.4

## 2024-09-02 MED ORDER — LIDOCAINE 5 % EX PTCH
1.0000 | MEDICATED_PATCH | CUTANEOUS | Status: DC
  Administered 2024-09-02 – 2024-09-04 (×3): 1 via TRANSDERMAL
  Filled 2024-09-02 (×3): qty 1

## 2024-09-02 MED ORDER — SODIUM CHLORIDE 0.9 % IV BOLUS
1000.0000 mL | Freq: Once | INTRAVENOUS | Status: AC
  Administered 2024-09-02: 13:00:00 1000 mL via INTRAVENOUS

## 2024-09-02 MED ORDER — SODIUM CHLORIDE 0.9 % IV BOLUS
1000.0000 mL | Freq: Once | INTRAVENOUS | Status: AC
  Administered 2024-09-02: 15:00:00 1000 mL via INTRAVENOUS

## 2024-09-02 NOTE — Plan of Care (Signed)
 FMTS Interim Progress Note  S:Visited patient at the bedside for PM rounds with Dr. Nygaard. Spoke to patient with spanish interpreter. Patient says that she was able to tolerate eating a full meal without any adverse effects. She does not feel lightheaded at this time anymore and has improved from when she first came to the ED.   Says that she had not been eating as much due to having diarrhea when she would eat and stomach discomfort with certain foods like milk. She would be constipated for 2 days and then have copious diarrhea, then go back to be constipated.   Her main concern now is back pain across her lower back. This is an exacerbation of pain she has chronically in this area. Moving causes the pain. Denies cramping nature of pain or radiation. Says tylenol  improves the pain. Denies any urinary symptoms. Has been having good urine output.   Also endorses left knee pain which is similar to her chronic knee pain.   O: BP (!) 146/87   Pulse 85   Temp 98.3 F (36.8 C)   Resp 18   SpO2 100%   General: well appearing, in no acute distress  CV: RRR, no murmurs, rubs, or gallops  Resp: normal work of breathing on room air, CTAB Abd: soft, non distended, mildly tender to palpation diffusely, no guarding  Ext: no BLE edema  MSK: knees with symmetric appearance, no erythema or edema, no palpable cyst in popliteal fossa, normal range of motion passively and actively  Back: increased tenderness in lumbar paraspinal muscles.   A/P: Hypotension  Has improved. Now patient with hypertension and is asymptomatic. Most likely due to decreased po intake and diarrhea. Her diarrhea is likely encopresis or IBS. Less likely patient has recurrent pyelonephritis given no urinary symptoms.  - Continue to monitor vitals  - lidocaine  patches for back pain.   Nicholas Bar, MD 09/02/2024, 10:07 PM PGY-3, [facility] Family Medicine Service pager [phone]

## 2024-09-02 NOTE — Patient Instructions (Signed)
 Thank you for coming in today! Here is a summary of what we discussed:  Please follow up after you leave the hospital! I hope you feel better soon!  Please call the clinic at [phone] if your symptoms worsen or you have any concerns.  Best, Dr Adele

## 2024-09-02 NOTE — ED Triage Notes (Signed)
 Pt BIB [facility] from doctor's office for hypotension. Pt stopped HTN meds recently. Initial BP from EMS 84/60 lying, 60 palp when standing. Pt received 500 mL NS, most recent BP 120/60, 78/50 when standing. All other VSS.

## 2024-09-02 NOTE — Progress Notes (Addendum)
° ° ° °  SUBJECTIVE:   CHIEF COMPLAINT / HPI:   Shelby Brown presents today for hospital follow up.   Hospitalized at [facility] from 12/4 to 12/6 due to UTI and hypotension  Hypotension and dizziness --feels dizzy when she stands up quickly or bends over --has been eating and drinking well recently, less yesterday, vomited once --some diarrhea in the last few days --feels well overall, dizziness improves when she sits down --no URI sx --no dysuria --no fevers or chills --reports intermittent central chest pain and dyspnea --CGM shows glucose 121  Reports intermittent shoulder/upper back pain --worse when sitting for long periods or slouching   PERTINENT  PMH / PSH: DM2  OBJECTIVE:   BP (!) 64/53   Pulse 86   Wt 112 lb (50.8 kg)   SpO2 99%   BMI 20.49 kg/m   Orthostatics: lying 94/65, sitting 81/57, standing 60/40  General: Awake and conversant, tired appearing CV: RRR, normal S1/S2, no M/R/G Pulm: CTAB, normal work of breathing on room air, no W/R/R. MSK: generalized weakness, needs assistance with standing/moving to chair Neuro: No focal deficits Psych: Appropriate mood and affect   ASSESSMENT/PLAN:   Assessment & Plan Hypotension, unspecified hypotension type Orthostatic hypotension Pt with significant hypotension to 60/50 and below with positive orthostatics. Reports relatively normal PO intake recently. Chronically ill -appearing but overall appears more well today compared to prior visits. No clear signs of infection, though has had recurrent UTI and pyleonephritis in the past. No hypoglycemia per CGM reading. Advised pt to go to the ED for further workup. Called EMS to transport due to hypotension and dizziness.    Rea Raring, MD [facility]

## 2024-09-02 NOTE — ED Provider Notes (Signed)
 [place] EMERGENCY DEPARTMENT AT [facility] Provider Note   CSN: [id number] Arrival date & time: 09/02/24  1251     Patient presents with: Hypotension   Shelby Brown is a 43 y.o. female.   Pt is a 43 yo female with pmhx significant for recurrent UTIs, DM2, and HLD.  Pt was admitted from 12/4-12/6 for hypotension and for suspected pyelo. Urine pansensitive.  She was d/c with keflex .   Pt said she's taken her meds.  She had a follow up appt with pcp today.  BP in the 60s in clinic, so EMS was called to bring her here.  Pt feels dizzy with standing.  She feels ok when she's sitting still.  She did receive 500 cc NS by EMS pta and remains orthostatic.   Due to language barrier, an interpreter was present during the history-taking and subsequent discussion (and for part of the physical exam) with this patient.        Prior to Admission medications  Medication Sig Start Date End Date Taking? Authorizing Provider  acetaminophen  (TYLENOL ) 500 MG tablet Take 2 tablets (1,000 mg total) by mouth every 6 (six) hours as needed (first line pain). 06/20/24   Theophilus Pagan, MD  atorvastatin  (LIPITOR) 20 MG tablet Take 1 tablet (20 mg total) by mouth daily. 07/29/24   Shitarev, Dimitry, MD  [Paused] famotidine  (PEPCID ) 20 MG tablet Take 1 tablet (20 mg total) by mouth daily. Patient not taking: Reported on 08/07/2024 Wait to take this until your doctor or other care provider tells you to start again. 06/20/24   Theophilus Pagan, MD  ferrous sulfate  325 (65 FE) MG tablet Take 1 tablet (325 mg total) by mouth every other day. Patient not taking: Reported on 08/07/2024 06/01/24   Delores Suzann HERO, MD  [Paused] gabapentin  (NEURONTIN ) 100 MG capsule Take 1 capsule (100 mg total) by mouth 3 (three) times daily. Wait to take this until your doctor or other care provider tells you to start again. 07/24/24   McDiarmid, Krystal BIRCH, MD  Insulin  Glargine (BASAGLAR  KWIKPEN) 100 UNIT/ML Inject 15  Units into the skin daily. 08/07/24   McDiarmid, Krystal BIRCH, MD  insulin  lispro (HUMALOG  KWIKPEN) 100 UNIT/ML KwikPen Inject 4-5 Units into the skin daily with supper. 08/07/24   McDiarmid, Krystal BIRCH, MD  Insulin  Pen Needle 31G X 8 MM MISC Use one pen needle twice daily. 05/29/24   McDiarmid, Krystal BIRCH, MD  ondansetron  (ZOFRAN -ODT) 4 MG disintegrating tablet Take 1 tablet (4 mg total) by mouth every 8 (eight) hours as needed for nausea or vomiting. 08/23/24   Baloch, Mahnoor, MD  promethazine  (PHENERGAN ) 6.25 MG/5ML syrup Take 5 mLs (6.25 mg total) by mouth 4 (four) times daily as needed for nausea or vomiting. Patient not taking: Reported on 11/16/2020 09/13/20 11/19/20  Lue Elsie BROCKS, MD    Allergies: Metformin  and related    Review of Systems  Neurological:  Positive for dizziness.  All other systems reviewed and are negative.   Updated Vital Signs BP (!) 123/106   Pulse 89   Temp 98.3 F (36.8 C) (Oral)   Resp 18   SpO2 100%   Physical Exam Vitals and nursing note reviewed.  Constitutional:      Appearance: Normal appearance.  HENT:     Head: Normocephalic and atraumatic.     Right Ear: External ear normal.     Left Ear: External ear normal.     Nose: Nose normal.     Mouth/Throat:  Mouth: Mucous membranes are dry.  Eyes:     Extraocular Movements: Extraocular movements intact.     Conjunctiva/sclera: Conjunctivae normal.     Pupils: Pupils are equal, round, and reactive to light.  Cardiovascular:     Rate and Rhythm: Normal rate and regular rhythm.     Pulses: Normal pulses.     Heart sounds: Normal heart sounds.  Pulmonary:     Effort: Pulmonary effort is normal.     Breath sounds: Normal breath sounds.  Abdominal:     General: Abdomen is flat. Bowel sounds are normal.     Palpations: Abdomen is soft.  Musculoskeletal:        General: Normal range of motion.     Cervical back: Normal range of motion and neck supple.  Skin:    General: Skin is warm.     Capillary  Refill: Capillary refill takes less than 2 seconds.  Neurological:     General: No focal deficit present.     Mental Status: She is alert and oriented to person, place, and time.  Psychiatric:        Mood and Affect: Mood normal.        Behavior: Behavior normal.     (all labs ordered are listed, but only abnormal results are displayed) Labs Reviewed  COMPREHENSIVE METABOLIC PANEL WITH GFR - Abnormal; Notable for the following components:      Result Value   Chloride 112 (*)    CO2 20 (*)    BUN 22 (*)    Calcium  8.0 (*)    Total Protein 5.7 (*)    Albumin 3.1 (*)    All other components within normal limits  CBC WITH DIFFERENTIAL/PLATELET - Abnormal; Notable for the following components:   RBC 3.24 (*)    Hemoglobin 8.7 (*)    HCT 28.1 (*)    All other components within normal limits  I-STAT CHEM 8, ED - Abnormal; Notable for the following components:   BUN 23 (*)    TCO2 20 (*)    Hemoglobin 9.2 (*)    HCT 27.0 (*)    All other components within normal limits  URINALYSIS, ROUTINE W REFLEX MICROSCOPIC  I-STAT CG4 LACTIC ACID, ED  I-STAT CG4 LACTIC ACID, ED    EKG: EKG Interpretation Date/Time:  Tuesday September 02 2024 13:23:26 EST Ventricular Rate:  83 PR Interval:  144 QRS Duration:  101 QT Interval:  392 QTC Calculation: 461 R Axis:   52  Text Interpretation: Sinus rhythm Low voltage, precordial leads ST elev, probable normal early repol pattern No significant change since last tracing Confirmed by Dean Clarity [phone]) on 09/02/2024 2:16:03 PM  Radiology: No results found.   Procedures   Medications Ordered in the ED  sodium chloride  0.9 % bolus 1,000 mL (0 mLs Intravenous Stopped 09/02/24 1430)  sodium chloride  0.9 % bolus 1,000 mL (1,000 mLs Intravenous New Bag/Given 09/02/24 1436)                                    Medical Decision Making Amount and/or Complexity of Data Reviewed Labs: ordered.  Risk Decision regarding  hospitalization.   This patient presents to the ED for concern of hypotension, this involves an extensive number of treatment options, and is a complaint that carries with it a high risk of complications and morbidity.  The differential diagnosis includes orthostasis, infection, poor oral intake  Co morbidities that complicate the patient evaluation  UTIs, DM2, and HLD   Additional history obtained:  Additional history obtained from epic chart review External records from outside source obtained and reviewed including EMS report   Lab Tests:  I Ordered, and personally interpreted labs.  The pertinent results include:  cbc with hgb 8.7 (stable); cmp with bun sl elevated at 22, cr nl; lactic nl   Cardiac Monitoring:  The patient was maintained on a cardiac monitor.  I personally viewed and interpreted the cardiac monitored which showed an underlying rhythm of: nsr   Medicines ordered and prescription drug management:  I ordered medication including ivfs  for sx  Reevaluation of the patient after these medicines showed that the patient improved I have reviewed the patients home medicines and have made adjustments as needed   Test Considered:  ct   Critical Interventions:  ivfs   Consultations Obtained:  I requested consultation with the FP residents,  and discussed lab and imaging findings as well as pertinent plan - they will admit   Problem List / ED Course:  Hypotension:  Possibly due to poor oral intake.  Pt may need midodrine as this keeps happening to pt.  Pt remains orthostatic and dizzy after fluids, but not as bad.  She is d/w [facility] residents for admission.   Reevaluation:  After the interventions noted above, I reevaluated the patient and found that they have :improved   Social Determinants of Health:  Spanish speaker; lives at home   Dispostion:  After consideration of the diagnostic results and the patients response to treatment, I feel that  the patent would benefit from admission.       Final diagnoses:  Dehydration  Orthostatic hypotension    ED Discharge Orders     None          Dean Clarity, MD 09/02/24 [phone]

## 2024-09-02 NOTE — Hospital Course (Signed)
 Shelby Brown is a 43 y.o.female with a history of recent pyelonephritis 12/25, T2DM, chronic anemia who was admitted to the [facility] Medicine Teaching Service at [facility] for hypotension. Her hospital course is detailed below:  Hypotension  Patient presented to the ED due to hypotension found during her clinic visit. She received IVF and improved. Labs were wnl. Patient had been eating less due to having diarrhea when eating. GI was consulted for normocytic anemia and diarrhea. Patient had ferritin of 30 despite being on ferrous sulfate . Patient underwent upper endoscopy and colonoscopy after bowel prep. *** Celiac panel ***   Other chronic conditions were medically managed with home medications and formulary alternatives as necessary (***)  PCP Follow-up Recommendations:

## 2024-09-02 NOTE — Assessment & Plan Note (Addendum)
 HLD: lipitor 20mg  daily Anemia: continue ferrous sulfate  T2DM: hold home insulin , CBG monitoring  -SSI Sensitive

## 2024-09-02 NOTE — Assessment & Plan Note (Signed)
 Patient with intermittent orthostatic dizziness over the last 2-3 days. Hypotensive in clinic w/ improvement in BP with IVF. Endorses persistent diarrhea over the last few months. BMP not showing significant derangements from diarrhea.  -Admit to FMTS, medsurg, attending Dr Delores -repeat orthostatic vitals -Mg, Phos pending -UA and urine osmolality -RD consult -AM CBC, BMP

## 2024-09-02 NOTE — ED Notes (Signed)
 CCMD called and verified patient on cardiac telemetry

## 2024-09-02 NOTE — H&P (Addendum)
 Hospital Admission History and Physical Service Pager: [phone]  Patient name: Shelby Brown Medical record number: [id number] Date of Birth: [birth year] Age: 43 y.o. Gender: female  Primary Care Provider: Adele Song, MD Consultants: None Code Status: Full Preferred Emergency Contact: Extended Emergency Contact Information Primary Emergency Contact: [name],[name] Address: [address]          [place] [address] [place] Home Phone: [phone] Mobile Phone: [phone] Relation: Spouse Secondary Emergency Contact: R,[name] Home Phone: [phone] Relation: Daughter   Chief Complaint: Dizziness, hypotension  Differential and Medical Decision Making:  Mackensey Bolte is a 43 y.o. female presenting with orthostatic dizziness .  Differential for this patient's presentation of this includes low blood volume (poor PO intake and chronic diarrhea), anemia (chronic and stable, doesn't account for acute worsening of dizziness but may be contributing), medication side effect (gabapentin  being held since last admission, doesn't endorse taking any unlisted medications).   Assessment & Plan Hypotension Patient with intermittent orthostatic dizziness over the last 2-3 days. Hypotensive in clinic w/ improvement in BP with IVF. Endorses persistent diarrhea over the last few months. BMP not showing significant derangements from diarrhea.  -Admit to FMTS, medsurg, attending Dr Delores -repeat orthostatic vitals -Mg, Phos pending -UA and urine osmolality -RD consult -AM CBC, BMP Chronic health problem HLD: lipitor 20mg  daily Anemia: continue ferrous sulfate  T2DM: hold home insulin , CBG monitoring  -SSI Sensitive  FEN/GI: Carb mod VTE Prophylaxis: Lovenox   Disposition: Medsurg  History of Present Illness:  Aisha Greenberger is a 43 y.o. female presenting with dizziness upon standing. Reports she is not eating a lot because she was in  the car and when she eats she has to go the bathroom very often. She has been having diarrhea more months. Doesn't matter what she eats, it will cause diarrhea. Denies blood in stool or melena. Finished last course of abx. Taking all other medications. Reports some palpitations occasionally. Is also having intermittent difficulty breathing when she feels she is getting dizzy- feels tired. Denies chest pain. Denies abdominal pain, endorses b/l low back/flank pain. Denies any dysuria or suprapubic pain. Reports she will eat and have normal bowel movements for a few days, then be constipated for a few days and the next day she will have diarrhea. Vomited x1 today. Denies fever, endorses mild chills. Patient denies any family hx of GI problems, chronic diarrhea, or auto immune conditions generally.   In the ED, she was brought in from clinic where she was severely hypotensive. She received 1L IVF and BP improved but she remained orthostatic.  Lab workup showed hgb 8.7 consistent w/ stable chronic anemia. Lactic acid 0.5. BNP w/ low albumin and CO2 but otherwise grossly WNL. No imaging given extent of recent imaging at prior admission.   Review Of Systems: Per HPI  Pertinent Past Medical History: T2DM HLD Pyelonephritis 12/25 Chronic anemia Remainder reviewed in history tab.   Pertinent Past Surgical History: Bilateral tubal ligation     Remainder reviewed in history tab.    Pertinent Social History: Tobacco use: No Alcohol use: No Other Substance use: no Lives with husband and kids   Pertinent Family History: Diabetes in her sisters Rest per hx tab   Important Outpatient Medications: Ferrous sulfate  15u Glargine daily 4-5u Lispro w/ dinner Zofran  q8 prn Lipitor 20mg  daily Tylenol  PRN  Objective: BP (!) 123/106   Pulse 89   Temp 98.3 F (36.8 C) (Oral)   Resp 18   SpO2 100%  Exam:  General: Awake, alert, NAD. Communicates clearly. HEENT: NCAT. MM appear dry.  Cardio: RRR.  Normal S1, S2. No murmur, rub, gallop. 2+ radial and dorsalis pedis pulses b/l w/ good capillary refill. No LE edema.  Resp: CTA bilaterally. No wheezes, rales, or rhonchi. Normal work of breathing on room air Abdomen: soft, non-tender, non-distended. +R flank tenderness. No suprapubic or epigastric TTP.  Neuro: AOx4. No focal deficits.   Labs:  CBC BMET  Recent Labs  Lab 09/02/24 1313 09/02/24 1333  WBC 7.5  --   HGB 8.7* 9.2*  HCT 28.1* 27.0*  PLT 393  --    Recent Labs  Lab 09/02/24 1313 09/02/24 1333  NA 140 141  K 4.2 4.5  CL 112* 110  CO2 20*  --   BUN 22* 23*  CREATININE 0.78 0.90  GLUCOSE 84 87  CALCIUM  8.0*  --      Albumin 3.1 ALT, AST, Alk phos, Tbili WNL Lactic acid 0.5  EKG: Sinus rhythm   Imaging Studies Performed:  None  Manon Jester, DO 09/02/2024, 3:38 PM PGY-1, [facility]  FPTS Intern pager: [phone], text pages welcome Secure chat group [facility] Teaching Service

## 2024-09-03 ENCOUNTER — Inpatient Hospital Stay (HOSPITAL_COMMUNITY): Payer: Self-pay

## 2024-09-03 ENCOUNTER — Encounter (HOSPITAL_COMMUNITY): Payer: Self-pay

## 2024-09-03 DIAGNOSIS — D649 Anemia, unspecified: Secondary | ICD-10-CM

## 2024-09-03 DIAGNOSIS — K529 Noninfective gastroenteritis and colitis, unspecified: Secondary | ICD-10-CM | POA: Diagnosis present

## 2024-09-03 LAB — CBC
HCT: 25.8 % — ABNORMAL LOW (ref 36.0–46.0)
Hemoglobin: 8 g/dL — ABNORMAL LOW (ref 12.0–15.0)
MCH: 27.4 pg (ref 26.0–34.0)
MCHC: 31 g/dL (ref 30.0–36.0)
MCV: 88.4 fL (ref 80.0–100.0)
Platelets: 353 K/uL (ref 150–400)
RBC: 2.92 MIL/uL — ABNORMAL LOW (ref 3.87–5.11)
RDW: 14.8 % (ref 11.5–15.5)
WBC: 6.6 K/uL (ref 4.0–10.5)
nRBC: 0 % (ref 0.0–0.2)

## 2024-09-03 LAB — BASIC METABOLIC PANEL WITH GFR
Anion gap: 7 (ref 5–15)
BUN: 18 mg/dL (ref 6–20)
CO2: 21 mmol/L — ABNORMAL LOW (ref 22–32)
Calcium: 8 mg/dL — ABNORMAL LOW (ref 8.9–10.3)
Chloride: 110 mmol/L (ref 98–111)
Creatinine, Ser: 0.78 mg/dL (ref 0.44–1.00)
GFR, Estimated: >60 mL/min (ref >60)
Glucose, Bld: 121 mg/dL — ABNORMAL HIGH (ref 70–99)
Potassium: 4.3 mmol/L (ref 3.5–5.1)
Sodium: 138 mmol/L (ref 135–145)

## 2024-09-03 LAB — C DIFFICILE QUICK SCREEN W PCR REFLEX
C Diff antigen: NEGATIVE
C Diff interpretation: NOT DETECTED
C Diff toxin: NEGATIVE

## 2024-09-03 LAB — CBG MONITORING, ED: Glucose-Capillary: 130 mg/dL — ABNORMAL HIGH (ref 70–99)

## 2024-09-03 LAB — PHOSPHORUS: Phosphorus: 3.1 mg/dL (ref 2.5–4.6)

## 2024-09-03 LAB — GLUCOSE, CAPILLARY
Glucose-Capillary: 133 mg/dL — ABNORMAL HIGH (ref 70–99)
Glucose-Capillary: 137 mg/dL — ABNORMAL HIGH (ref 70–99)

## 2024-09-03 LAB — C-REACTIVE PROTEIN: CRP: <3 mg/dL — ABNORMAL HIGH (ref <1.0)

## 2024-09-03 LAB — FERRITIN: Ferritin: 30 ng/mL (ref 11–307)

## 2024-09-03 LAB — MAGNESIUM: Magnesium: 1.7 mg/dL (ref 1.7–2.4)

## 2024-09-03 MED ORDER — LACTULOSE 10 GM/15ML PO SOLN
20.0000 g | Freq: Three times a day (TID) | ORAL | Status: AC
  Administered 2024-09-03 – 2024-09-04 (×3): 20 g via ORAL
  Filled 2024-09-03 (×4): qty 30

## 2024-09-03 MED ORDER — NA SULFATE-K SULFATE-MG SULF 17.5-3.13-1.6 GM/177ML PO SOLN
0.5000 | Freq: Once | ORAL | Status: AC
  Administered 2024-09-04: 16:00:00 177 mL via ORAL
  Filled 2024-09-03: qty 1

## 2024-09-03 MED ORDER — BISACODYL 5 MG PO TBEC
10.0000 mg | DELAYED_RELEASE_TABLET | Freq: Once | ORAL | Status: AC
  Administered 2024-09-03: 18:00:00 10 mg via ORAL
  Filled 2024-09-03: qty 2

## 2024-09-03 MED ORDER — NA SULFATE-K SULFATE-MG SULF 17.5-3.13-1.6 GM/177ML PO SOLN
0.5000 | Freq: Once | ORAL | Status: AC
  Administered 2024-09-04: 21:00:00 177 mL via ORAL

## 2024-09-03 MED ORDER — SIMETHICONE 80 MG PO CHEW
240.0000 mg | CHEWABLE_TABLET | Freq: Once | ORAL | Status: AC
  Administered 2024-09-04: 16:00:00 240 mg via ORAL
  Filled 2024-09-03: qty 3

## 2024-09-03 MED ORDER — ADULT MULTIVITAMIN W/MINERALS CH
1.0000 | ORAL_TABLET | Freq: Every day | ORAL | Status: DC
  Administered 2024-09-04 – 2024-09-06 (×3): 1 via ORAL
  Filled 2024-09-03 (×5): qty 1

## 2024-09-03 MED ORDER — SODIUM CHLORIDE 0.9 % IV SOLN: INTRAVENOUS | Status: AC

## 2024-09-03 MED ORDER — SIMETHICONE 80 MG PO CHEW
240.0000 mg | CHEWABLE_TABLET | Freq: Once | ORAL | Status: AC
  Administered 2024-09-04: 21:00:00 240 mg via ORAL
  Filled 2024-09-03: qty 3

## 2024-09-03 NOTE — Assessment & Plan Note (Addendum)
 BP appropriate,  orthostatic still positive 12/17 AM. Lab workup reassuring w/ some dilutional anemia.  - Continue 90mL / hr NS for now - Mg Phos WNL -RD consulted -AM BMP -Orthostatic vitals - Compression stockings

## 2024-09-03 NOTE — Assessment & Plan Note (Addendum)
 Pt reports chronic cycle of constipation and loose diarrhea. No BM last 12 hours. Diffuse abdominal tenderness on exam this AM. Hx of non resolving IDA while on iron.  -KUB w/ stool throughout, concerning for encopresis.  -Celiac workup pending -GI consult for possible scope given chronic stool challenges and unresolving anemia.
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
## 2024-09-03 NOTE — Assessment & Plan Note (Addendum)
 HLD: lipitor 20mg  daily Anemia: continue ferrous sulfate , hgb 8, likely dilutional. Ferritin WNL T2DM: hold home insulin , CBG monitoring, blood glucoses within range last 24 hours.   -Continue SSI Sensitive

## 2024-09-03 NOTE — Plan of Care (Signed)

## 2024-09-03 NOTE — Progress Notes (Addendum)
 Daily Progress Note Intern Pager: [phone]  Patient name: Shelby Brown Medical record number: [id number] Date of birth: 05-05-1981 Age: 43 y.o. Gender: female  Primary Care Provider: Adele Song, MD Consultants: GI Code Status: Full  Pt Overview and Major Events to Date:  12/16-admitted  Medical Decision Making:  43 year old female admitted for refractory orthostatic hypotension and dizziness.  Blood pressures responding well to IV fluids but remains orthostatic this morning, possibly secondary to uncontrolled diabetes.  Patient's p.o. limited by chronic diarrhea, imaging showing significant stool burden raising the possibility of encopresis.  Celiac testing pending, GI consulted. Starting aggressive bowel regimen.  Assessment & Plan Hypotension Dehydration BP appropriate,  orthostatic still positive 12/17 AM. Lab workup reassuring w/ some dilutional anemia.  - Continue 90mL / hr NS for now - Mg Phos WNL -RD consulted -AM BMP -Orthostatic vitals - Compression stockings  Chronic health problem HLD: lipitor 20mg  daily Anemia: continue ferrous sulfate , hgb 8, likely dilutional. Ferritin WNL T2DM: hold home insulin , CBG monitoring, blood glucoses within range last 24 hours.   -Continue SSI Sensitive Chronic diarrhea Pt reports chronic cycle of constipation and loose diarrhea. No BM last 12 hours. Diffuse abdominal tenderness on exam this AM. Hx of non resolving IDA while on iron.  -KUB w/ stool throughout, concerning for encopresis.  -Celiac workup pending -GI consult for possible scope given chronic stool challenges and unresolving anemia.    FEN/GI: Carb modified PPx: Lovenox  Dispo:Home pending GI workup.   Subjective:  Patient seen with video interpreter.  Endorses back pain and knee pain this morning.  Discussed the need for additional imaging to evaluate stool burden and possible etiology of chronic constipation and diarrhea.  Patient with no other acute  concerns this morning.  Objective: Temp:  [98.3 F (36.8 C)-98.6 F (37 C)] 98.6 F (37 C) (12/17 0455) Pulse Rate:  [59-89] 78 (12/17 0455) Resp:  [13-21] 16 (12/17 0455) BP: (64-146)/(39-106) 131/84 (12/17 0455) SpO2:  [95 %-100 %] 100 % (12/17 0455) Weight:  [50.8 kg] 50.8 kg (12/16 1101) Physical Exam: General: Awake, alert, NAD. Communicates clearly via interpreter. HEENT: NCAT. MMM.  Cardio: RRR. Normal S1, S2. No murmur, rub, gallop. 1+ radial pulses b/l w/ good capillary refill. No LE edema. Resp: CTA bilaterally. No wheezes, rales, or rhonchi. Normal work of breathing on room air Abdomen: soft, non-distended. Normoactive BS auscultated. Diffuse generalized abdominal discomfort with deep palpation.   Laboratory: Most recent CBC Lab Results  Component Value Date   WBC 6.6 09/03/2024   HGB 8.0 (L) 09/03/2024   HCT 25.8 (L) 09/03/2024   MCV 88.4 09/03/2024   PLT 353 09/03/2024   Most recent BMP    Latest Ref Rng & Units 09/03/2024    5:00 AM  BMP  Glucose 70 - 99 mg/dL 878   BUN 6 - 20 mg/dL 18   Creatinine 9.55 - 1.00 mg/dL 9.21   Sodium 864 - 854 mmol/L 138   Potassium 3.5 - 5.1 mmol/L 4.3   Chloride 98 - 111 mmol/L 110   CO2 22 - 32 mmol/L 21   Calcium  8.9 - 10.3 mg/dL 8.0    AST 16 ALT 11 Alk Phos 48 T bili 0.3 UA w/ moderate leuks and many bacteria Ferritin 30 TTG and gliadin antibodies pending MG and phos WNL  Imaging/Diagnostic Tests:  KUB w/ nonobstructive bowel gas pattern. Stool noted throughout the colon and the R lower abdomen in particular on my read of the xray.   Manon,  Leafy, DO 09/03/2024, 7:23 AM  PGY-1, [facility] FPTS Intern pager: [phone], text pages welcome Secure chat group [facility] Teaching Service

## 2024-09-03 NOTE — ED Notes (Signed)
 Report given to yellow hallway RN

## 2024-09-03 NOTE — Progress Notes (Signed)
 Initial Nutrition Assessment  DOCUMENTATION CODES:  Not applicable  INTERVENTION:  Multivitamin w/ minerals daily Monitor PO intake, encourage good intake  NUTRITION DIAGNOSIS:  Inadequate oral intake related to diarrhea as evidenced by per patient/family report.  GOAL:  Patient will meet greater than or equal to 90% of their needs  MONITOR:  PO intake, I & O's, Weight trends, Labs  REASON FOR ASSESSMENT:  Consult Poor PO  ASSESSMENT:  43 y.o. female presented to the ED with hypotension and dizziness from PCP clinic. PMH includes T2DM, MDD, HLD, and chronic anemia. Pt admitted with hypotension.   12/16 - Admitted   RD met with patient with the use of in-person interpreter. Patient reports that she was eating okay at home and had just finished al of her lunch. Reports that at home she will eat good for 2 days and then on the third day she will not eat well due to having multiple episodes of diarrhea. Typically she has 2-3 meals per day, either her daughter bring food from her job at a [employer] or she eats something from home. Occasionally she will have a snack of fruit. Patient reports that on the 2 days that she eats good, she will not have any bowel movements but her stomach does get cramping and hurt some.  Reports that on days that she has diarrhea, she will have 5-7 bowel movements per day. Endorses vomiting occasionally, but is not regular; reports when she is full and tries to eat. She drinks ~3 bottles of water, sips of diet soda per day and juice or tea occasionally.   Discussed trying to increase fluid intake to help with constipation and fluid loss from diarrhea. Patient expressed understanding. GI team plans to complete upper GI and colonoscopy on Friday, plans to be clear liquids starting tomorrow morning. Will continue to monitor diet progression for needs to assist with PO intake.   Patient reports a 4-5# weight loss but was unable to provide the time frame. No  new weight has been obtained this admission, will ask RN to obtain. Per weight history, patient with some weight loss but not significant for time frame.   Nutrition Related Medications: Dulcolax, Ferrous Sulfate , NovoLog  0-9 units TID, Lactulose  Labs: Sodium 138, Potassium 4.3, BUN 18, Creatinine 0.78, Phosphorus 3.1, Magnesium  1.7, Hgb A1c 9.5 (07/25/24) CBG: 86-133 mg/dL x 24 hrs   NUTRITION - FOCUSED PHYSICAL EXAM: Flowsheet Row Most Recent Value  Orbital Region No depletion  Upper Arm Region No depletion  Thoracic and Lumbar Region No depletion  Buccal Region No depletion  Temple Region No depletion  Clavicle Bone Region Mild depletion  Clavicle and Acromion Bone Region Mild depletion  Scapular Bone Region Mild depletion  Dorsal Hand No depletion  Patellar Region Mild depletion  Anterior Thigh Region Mild depletion  Posterior Calf Region Mild depletion  Edema (RD Assessment) None  Hair Reviewed  Eyes Reviewed  Mouth Reviewed  Skin Reviewed  Nails Reviewed   Diet Order:   Diet Order             Diet clear liquid Room service appropriate? Yes; Fluid consistency: Thin  Diet effective 0500 tomorrow           Diet Carb Modified Room service appropriate? Yes  Diet effective now                  EDUCATION NEEDS:  Education needs have been addressed  Skin:  Skin Assessment: Reviewed RN Assessment  Last BM:  Unknown  Height:  Ht Readings from Last 1 Encounters:  08/21/24 5' 2 (1.575 m)   Weight:  Wt Readings from Last 1 Encounters:  09/02/24 50.8 kg   Ideal Body Weight:  50 kg  BMI:  There is no height or weight on file to calculate BMI.  Estimated Nutritional Needs:  Kcal:  1800-2000 Protein:  80-100 grams Fluid:  >/= 1.7 L   Nestora Glatter RD, LDN Registered Dietitian I Please see AMION for contact information

## 2024-09-04 ENCOUNTER — Encounter (HOSPITAL_COMMUNITY): Payer: Self-pay

## 2024-09-04 ENCOUNTER — Ambulatory Visit: Payer: Self-pay | Admitting: Pharmacist

## 2024-09-04 DIAGNOSIS — K529 Noninfective gastroenteritis and colitis, unspecified: Secondary | ICD-10-CM

## 2024-09-04 DIAGNOSIS — I951 Orthostatic hypotension: Secondary | ICD-10-CM

## 2024-09-04 LAB — BASIC METABOLIC PANEL WITH GFR
Anion gap: 7 (ref 5–15)
BUN: 14 mg/dL (ref 6–20)
CO2: 22 mmol/L (ref 22–32)
Calcium: 8.6 mg/dL — ABNORMAL LOW (ref 8.9–10.3)
Chloride: 110 mmol/L (ref 98–111)
Creatinine, Ser: 0.69 mg/dL (ref 0.44–1.00)
GFR, Estimated: >60 mL/min (ref >60)
Glucose, Bld: 87 mg/dL (ref 70–99)
Potassium: 4 mmol/L (ref 3.5–5.1)
Sodium: 139 mmol/L (ref 135–145)

## 2024-09-04 LAB — GASTROINTESTINAL PANEL BY PCR, STOOL (REPLACES STOOL CULTURE)

## 2024-09-04 LAB — CBC
HCT: 26.4 % — ABNORMAL LOW (ref 36.0–46.0)
Hemoglobin: 8.6 g/dL — ABNORMAL LOW (ref 12.0–15.0)
MCH: 27.9 pg (ref 26.0–34.0)
MCHC: 32.6 g/dL (ref 30.0–36.0)
MCV: 85.7 fL (ref 80.0–100.0)
Platelets: 366 K/uL (ref 150–400)
RBC: 3.08 MIL/uL — ABNORMAL LOW (ref 3.87–5.11)
RDW: 14.6 % (ref 11.5–15.5)
WBC: 7.4 K/uL (ref 4.0–10.5)
nRBC: 0 % (ref 0.0–0.2)

## 2024-09-04 LAB — GLUCOSE, CAPILLARY
Glucose-Capillary: 102 mg/dL — ABNORMAL HIGH (ref 70–99)
Glucose-Capillary: 136 mg/dL — ABNORMAL HIGH (ref 70–99)
Glucose-Capillary: 172 mg/dL — ABNORMAL HIGH (ref 70–99)
Glucose-Capillary: 81 mg/dL (ref 70–99)

## 2024-09-04 LAB — TISSUE TRANSGLUTAMINASE, IGA: Tissue Transglutaminase Ab, IgA: <2 U/mL (ref 0–3)

## 2024-09-04 LAB — GLIADIN ANTIBODIES, SERUM
Deamidated Gliadin Abs, IgA: 4 U (ref 0–19)
Deamidated Gliadin Abs, IgG: 2 U (ref 0–19)

## 2024-09-04 MED ADMIN — Ondansetron Orally Disintegrating Tab 4 MG: 4 mg | ORAL | @ 21:00:00 | NDC 16714020010

## 2024-09-04 MED FILL — Ondansetron Orally Disintegrating Tab 4 MG: 4.0000 mg | ORAL | Qty: 1 | Status: AC

## 2024-09-04 NOTE — Assessment & Plan Note (Addendum)
 Stools x 5 last 24 hours.  -GI Lab workup to date: CRP WNL. C diff negative -GI pathogen panel pending, celiac workup negative -GI consult  -EGD & colonoscopy 12/19  -clears and bowel clean out today

## 2024-09-04 NOTE — Progress Notes (Signed)
 Patient ID: Shelby Brown, female   DOB: 01-30-1981, 43 y.o.   MRN: [id number]    Progress Note   Subjective   Day # 2 CC; normocytic anemia, diarrhea  Patient seen this morning with assistance of the video interpreter.  She relates being given laxatives last night and had 3-4 bowel movements.  She had interpreted this as a bowel prep.  That was clarified with her nurse that she has not been given the bowel prep which is ordered for this afternoon. Says her stools are very loose liquid.  Complains of mild abdominal discomfort, tolerating clear liquids  Labs today WBC 7.4/hemoglobin 8.6/hematocrit 26.4/MCV 85.7 Potassium 4.0/BUN 14/creatinine 0.69/glucose 86 C. difficile quick screen negative GI path panel negative   Objective   Vital signs in last 24 hours: Temp:  [98.2 F (36.8 C)-98.3 F (36.8 C)] 98.2 F (36.8 C) (12/18 0812) Pulse Rate:  [76-87] 87 (12/18 0812) Resp:  [16] 16 (12/18 0812) BP: (109-143)/(59-79) 143/75 (12/18 0812) SpO2:  [95 %-100 %] 98 % (12/18 0812) Weight:  [52.3 kg] 52.3 kg (12/17 1811)   General:    Hispanic female in NAD Heart:  Regular rate and rhythm; no murmurs Lungs: Respirations even and unlabored, lungs CTA bilaterally Abdomen:  Soft, nontender and nondistended. Normal bowel sounds. Extremities:  Without edema. Neurologic:  Alert and oriented,  grossly normal neurologically. Psych:  Cooperative. Normal mood and affect.  Intake/Output from previous day: No intake/output data recorded. Intake/Output this shift: No intake/output data recorded.  Lab Results: Recent Labs    09/02/24 1313 09/02/24 1333 09/03/24 0500 09/04/24 0437  WBC 7.5  --  6.6 7.4  HGB 8.7* 9.2* 8.0* 8.6*  HCT 28.1* 27.0* 25.8* 26.4*  PLT 393  --  353 366   BMET Recent Labs    09/02/24 1313 09/02/24 1333 09/03/24 0500 09/04/24 0437  NA 140 141 138 139  K 4.2 4.5 4.3 4.0  CL 112* 110 110 110  CO2 20*  --  21* 22  GLUCOSE 84 87 121* 87  BUN 22* 23*  18 14  CREATININE 0.78 0.90 0.78 0.69  CALCIUM  8.0*  --  8.0* 8.6*   LFT Recent Labs    09/02/24 1313  PROT 5.7*  ALBUMIN 3.1*  AST 16  ALT 11  ALKPHOS 48  BILITOT 0.3   PT/INR No results for input(s): LABPROT, INR in the last 72 hours.  Studies/Results: DG Abd 1 View Result Date: 09/03/2024 CLINICAL DATA:  [id number] Ileus (HCC) [phone] EXAM: ABDOMEN - 1 VIEW COMPARISON:  07/24/2024 FINDINGS: Nonobstructive bowel gas pattern.Tubal ligation clips in the pelvis.No pneumoperitoneum. No organomegaly or radiopaque calculi. No acute fracture or destructive lesion. The lung bases are clear. IMPRESSION: Nonobstructive bowel gas pattern. Electronically Signed   By: Rogelia Myers M.D.   On: 09/03/2024 09:13       Assessment / Plan:    #86 43 year old non-English-speaking Hispanic female admitted from the medicine clinic after presenting there with feeling poorly dizziness and found to be hypotensive.  This is in the setting of a couple of recent admissions with recurrent urinary tract infections and pyelonephritis.  Hypotension resolved,  #2 diarrhea, onset March 2025 but initially intermittent by patient report worsened over the past month or so 5-6 bowel movements per day though not necessarily every day and complaints of crampy abdominal pain  C. difficile quick screen negative, GI path panel negative CT unremarkable previous  Neurology of the diarrhea is not clear, she has had several recent courses  of antibiotics, this may be antibiotic induced, we will rule out underlying IBD  #3 anemia/chronic normocytic with normal ferritin but low iron and iron sat-been on oral iron at home perhaps not completely replaced may need higher dose of oral iron  Globin stable here No report of menorrhagia  #4 diabetes mellitus 5.  Depression  Plan; clear liquid diet today, n.p.o. after midnight Prep this evening.  Patient is scheduled for EGD and colonoscopy with Dr. Albertus tomorrow  09/05/2024.  Procedures been discussed in detail with the patient including indications risks and benefits she is agreeable to proceed. Further GI recommendations pending findings     Active Problems:   Chronic health problem   Hypotension   Dehydration   Chronic diarrhea     LOS: 2 days   Niels Cranshaw EsterwoodPA-C  09/04/2024, 11:48 AM

## 2024-09-04 NOTE — Plan of Care (Signed)

## 2024-09-04 NOTE — Assessment & Plan Note (Addendum)
 VSS. Labs WNL, hgb improved.  - Change to 1/51mIVF  -RD consulted -AM BMP -Orthostatic vitals - Compression stockings

## 2024-09-04 NOTE — TOC Initial Note (Signed)
 Transition of Care [facility]) - Initial/Assessment Note    Patient Details  Name: Shelby Brown MRN: [id number] Date of Birth: April 20, 1981  Transition of Care [facility]) CM/SW Contact:    Charlene Julian Daring, RN Phone Number:[phone] 09/04/2024, 1:57 PM  Clinical Narrative:                   43 year old non-English-speaking Hispanic female admitted from the medicine clinic after presenting there with feeling poorly dizziness and found to be hypotensive.  This is in the setting of a couple of recent admissions with recurrent urinary tract infections and pyelonephritis.  Consult for medication needs. CM met with patient in room and with spanish interpreter ( Raquel) and patient's daughter. Patient shared she does not have insurance and CM provided MATCH for patient and notified TOC pharmacy and provider that please send dc medications to our [facility] pharmacy and meds will be covered for 30 days at no cost to the patient.  Patient is active with [facility] for PCP and daughter shared she will assist with her mom to a hospital follow up when needed after discharge.   CM shared with daughter and patient [employer] on food resources.   Daughter drives and provides transportation to her mom ( the patient) and they denied any more needs.        Expected Discharge Plan and Services MATCH completed Resources for Food Has PCP- [facility]  Prior Living Arrangements/Services Lives with husband and daughter  Activities of Daily Living   ADL Screening (condition at time of admission) Independently performs ADLs?: Yes (appropriate for developmental age) Is the patient deaf or have difficulty hearing?: No Does the patient have difficulty seeing, even when wearing glasses/contacts?: Yes Does the patient have difficulty concentrating, remembering, or making decisions?: No   Admission diagnosis:  Orthostatic hypotension [I95.1] Dehydration  [E86.0] Hypotension [I95.9] Patient Active Problem List   Diagnosis Date Noted   Chronic diarrhea 09/03/2024   Dehydration 09/02/2024   Leg pain, anterior 08/22/2024   Flank pain 08/21/2024   Left lower quadrant pain 07/24/2024   Yeast infection of the vagina 07/24/2024   Left shoulder pain 06/20/2024   Recurrent pyelonephritis 06/19/2024   Vaginal itching 06/19/2024   Malnutrition of moderate degree 05/30/2024   UTI (urinary tract infection) 05/29/2024   Normocytic anemia 05/29/2024   Diabetic polyneuropathy (HCC) 05/29/2024   Chronic health problem 03/13/2024   Hemianopia, homonymous, 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 [place] [address] Phone: [phone] Fax: [phone]  [facility] [facility] [address], [address] [place] [address] Phone: [phone] Fax: [phone]  [facility] [address] [place] [address] Phone: [phone] Fax: [phone]  [facility] [address], [address] [place] [address] Phone: [phone] Fax: [phone]     Social Drivers of Health (SDOH) Social History: SDOH Screenings   Food Insecurity: Food Insecurity Present (09/03/2024)  Housing: High Risk (09/03/2024)  Transportation Needs: Unmet Transportation Needs (09/03/2024)  Utilities: At Risk (09/03/2024)  Depression (PHQ2-9): High Risk (09/02/2024)  Tobacco Use: Low Risk (  09/04/2024)    Readmission Risk Interventions    08/23/2024   10:23 AM 12/28/2023    3:39 PM  Readmission Risk Prevention Plan  Post  Dischage Appt  Complete  Medication Screening  Complete  Transportation Screening Complete Complete  PCP or Specialist Appt within 5-7 Days Complete   Home Care Screening Complete   Medication Review (RN CM) Referral to Pharmacy

## 2024-09-04 NOTE — Progress Notes (Signed)
 Bowel prep started at 1604

## 2024-09-04 NOTE — H&P (View-Only) (Signed)
 Patient ID: Shelby Brown, female   DOB: 01-30-1981, 43 y.o.   MRN: [id number]    Progress Note   Subjective   Day # 2 CC; normocytic anemia, diarrhea  Patient seen this morning with assistance of the video interpreter.  She relates being given laxatives last night and had 3-4 bowel movements.  She had interpreted this as a bowel prep.  That was clarified with her nurse that she has not been given the bowel prep which is ordered for this afternoon. Says her stools are very loose liquid.  Complains of mild abdominal discomfort, tolerating clear liquids  Labs today WBC 7.4/hemoglobin 8.6/hematocrit 26.4/MCV 85.7 Potassium 4.0/BUN 14/creatinine 0.69/glucose 86 C. difficile quick screen negative GI path panel negative   Objective   Vital signs in last 24 hours: Temp:  [98.2 F (36.8 C)-98.3 F (36.8 C)] 98.2 F (36.8 C) (12/18 0812) Pulse Rate:  [76-87] 87 (12/18 0812) Resp:  [16] 16 (12/18 0812) BP: (109-143)/(59-79) 143/75 (12/18 0812) SpO2:  [95 %-100 %] 98 % (12/18 0812) Weight:  [52.3 kg] 52.3 kg (12/17 1811)   General:    Hispanic female in NAD Heart:  Regular rate and rhythm; no murmurs Lungs: Respirations even and unlabored, lungs CTA bilaterally Abdomen:  Soft, nontender and nondistended. Normal bowel sounds. Extremities:  Without edema. Neurologic:  Alert and oriented,  grossly normal neurologically. Psych:  Cooperative. Normal mood and affect.  Intake/Output from previous day: No intake/output data recorded. Intake/Output this shift: No intake/output data recorded.  Lab Results: Recent Labs    09/02/24 1313 09/02/24 1333 09/03/24 0500 09/04/24 0437  WBC 7.5  --  6.6 7.4  HGB 8.7* 9.2* 8.0* 8.6*  HCT 28.1* 27.0* 25.8* 26.4*  PLT 393  --  353 366   BMET Recent Labs    09/02/24 1313 09/02/24 1333 09/03/24 0500 09/04/24 0437  NA 140 141 138 139  K 4.2 4.5 4.3 4.0  CL 112* 110 110 110  CO2 20*  --  21* 22  GLUCOSE 84 87 121* 87  BUN 22* 23*  18 14  CREATININE 0.78 0.90 0.78 0.69  CALCIUM  8.0*  --  8.0* 8.6*   LFT Recent Labs    09/02/24 1313  PROT 5.7*  ALBUMIN 3.1*  AST 16  ALT 11  ALKPHOS 48  BILITOT 0.3   PT/INR No results for input(s): LABPROT, INR in the last 72 hours.  Studies/Results: DG Abd 1 View Result Date: 09/03/2024 CLINICAL DATA:  [id number] Ileus (HCC) [phone] EXAM: ABDOMEN - 1 VIEW COMPARISON:  07/24/2024 FINDINGS: Nonobstructive bowel gas pattern.Tubal ligation clips in the pelvis.No pneumoperitoneum. No organomegaly or radiopaque calculi. No acute fracture or destructive lesion. The lung bases are clear. IMPRESSION: Nonobstructive bowel gas pattern. Electronically Signed   By: Rogelia Myers M.D.   On: 09/03/2024 09:13       Assessment / Plan:    #86 43 year old non-English-speaking Hispanic female admitted from the medicine clinic after presenting there with feeling poorly dizziness and found to be hypotensive.  This is in the setting of a couple of recent admissions with recurrent urinary tract infections and pyelonephritis.  Hypotension resolved,  #2 diarrhea, onset March 2025 but initially intermittent by patient report worsened over the past month or so 5-6 bowel movements per day though not necessarily every day and complaints of crampy abdominal pain  C. difficile quick screen negative, GI path panel negative CT unremarkable previous  Neurology of the diarrhea is not clear, she has had several recent courses  of antibiotics, this may be antibiotic induced, we will rule out underlying IBD  #3 anemia/chronic normocytic with normal ferritin but low iron and iron sat-been on oral iron at home perhaps not completely replaced may need higher dose of oral iron  Globin stable here No report of menorrhagia  #4 diabetes mellitus 5.  Depression  Plan; clear liquid diet today, n.p.o. after midnight Prep this evening.  Patient is scheduled for EGD and colonoscopy with Dr. Albertus tomorrow  09/05/2024.  Procedures been discussed in detail with the patient including indications risks and benefits she is agreeable to proceed. Further GI recommendations pending findings     Active Problems:   Chronic health problem   Hypotension   Dehydration   Chronic diarrhea     LOS: 2 days   Niels Cranshaw EsterwoodPA-C  09/04/2024, 11:48 AM

## 2024-09-04 NOTE — Progress Notes (Signed)
° ° ° °  Daily Progress Note Intern Pager: [phone]  Patient name: Shelby Brown Medical record number: [id number] Date of birth: 04-07-1981 Age: 43 y.o. Gender: female  Primary Care Provider: Adele Song, MD Consultants: GI Code Status: Full  Pt Overview and Major Events to Date:  12/16-admitted 12/19 - EGD and colonoscopy scheduled  Medical Decision Making:  43 year old female admitted for orthostatic hypotension and dizziness in the setting of controlled type 2 diabetes, chronic anemia and poor p.o. intake secondary to chronic diarrhea.  Vital signs now stable.  C. difficile testing negative.  GI pathogen panel and celiac disease testing still pending.  Doing a bowel washout in anticipation of upper and lower endoscopy 12/19.  Assessment & Plan Chronic diarrhea Stools x 5 last 24 hours.  -GI Lab workup to date: CRP WNL. C diff negative -GI pathogen panel pending, celiac workup negative -GI consult  -EGD & colonoscopy 12/19  -clears and bowel clean out today Hypotension (Resolved: 09/04/2024) Dehydration VSS. Labs WNL, hgb improved.  - Change to 1/52mIVF  -RD consulted -AM BMP -Orthostatic vitals - Compression stockings  Chronic health problem HLD: lipitor 20mg  daily Anemia: continue ferrous sulfate  T2DM: CBGs stable, no change to current plan. hold home insulin , CBG monitoring, blood glucoses within range last 24 hours.   -Continue SSI Sensitive   FEN/GI: Clear liquids PPx: Lovenox  Dispo:Home in 2-3 days. Barriers include upper and lower endoscopy.   Subjective:  No acute events overnight.  She reports 5 bowel movements since yesterday.  She endorses some stabby, crampy abdominal pain throughout her abdomen.  Discussed that this is likely 2/2 constipation but tomorrow's imaging will rule out all serious GI etiologies.  Patient has low level of concern this morning.  Objective: Temp:  [98.3 F (36.8 C)-98.7 F (37.1 C)] 98.3 F (36.8 C) (12/18 0025) Pulse  Rate:  [76-82] 76 (12/18 0025) Resp:  [16-18] 16 (12/18 0025) BP: (109-146)/(59-79) 124/59 (12/18 0025) SpO2:  [95 %-100 %] 100 % (12/18 0025) Weight:  [52.3 kg] 52.3 kg (12/17 1811) Physical Exam: General: Awake, alert, NAD. Communicates clearly via phone interpreter.  Cardio: RRR. Normal S1, S2. No murmur, rub, gallop. 2+ radial and dorsalis pedis pulses b/l w/ good capillary refill. Resp: CTA bilaterally. No wheezes, rales, or rhonchi. Normal work of breathing on room air Abdomen: soft, non-distended. Some diffuse TTP with minimal guarding. No guarding, rigidity, or rebound.   Laboratory: Most recent CBC Lab Results  Component Value Date   WBC 7.4 09/04/2024   HGB 8.6 (L) 09/04/2024   HCT 26.4 (L) 09/04/2024   MCV 85.7 09/04/2024   PLT 366 09/04/2024   Most recent BMP    Latest Ref Rng & Units 09/04/2024    4:37 AM  BMP  Glucose 70 - 99 mg/dL 87   BUN 6 - 20 mg/dL 14   Creatinine 9.55 - 1.00 mg/dL 9.30   Sodium 864 - 854 mmol/L 139   Potassium 3.5 - 5.1 mmol/L 4.0   Chloride 98 - 111 mmol/L 110   CO2 22 - 32 mmol/L 22   Calcium  8.9 - 10.3 mg/dL 8.6      Imaging/Diagnostic Tests:   Manon Jester, DO 09/04/2024, 7:21 AM  PGY-1, [facility] Family Medicine FPTS Intern pager: [phone], text pages welcome Secure chat group [facility] Teaching Service

## 2024-09-04 NOTE — Assessment & Plan Note (Addendum)
 HLD: lipitor 20mg  daily Anemia: continue ferrous sulfate  T2DM: CBGs stable, no change to current plan. hold home insulin , CBG monitoring, blood glucoses within range last 24 hours.   -Continue SSI Sensitive

## 2024-09-05 ENCOUNTER — Encounter (HOSPITAL_COMMUNITY): Admission: EM | Payer: Self-pay | Source: Home / Self Care | Attending: Family Medicine

## 2024-09-05 ENCOUNTER — Inpatient Hospital Stay (HOSPITAL_COMMUNITY): Payer: Self-pay | Admitting: Anesthesiology

## 2024-09-05 ENCOUNTER — Encounter (HOSPITAL_COMMUNITY): Payer: Self-pay

## 2024-09-05 DIAGNOSIS — I509 Heart failure, unspecified: Secondary | ICD-10-CM

## 2024-09-05 DIAGNOSIS — Z794 Long term (current) use of insulin: Secondary | ICD-10-CM

## 2024-09-05 DIAGNOSIS — D509 Iron deficiency anemia, unspecified: Secondary | ICD-10-CM

## 2024-09-05 DIAGNOSIS — D122 Benign neoplasm of ascending colon: Secondary | ICD-10-CM

## 2024-09-05 DIAGNOSIS — K2980 Duodenitis without bleeding: Secondary | ICD-10-CM

## 2024-09-05 DIAGNOSIS — D123 Benign neoplasm of transverse colon: Secondary | ICD-10-CM

## 2024-09-05 DIAGNOSIS — E1165 Type 2 diabetes mellitus with hyperglycemia: Secondary | ICD-10-CM

## 2024-09-05 HISTORY — PX: POLYPECTOMY: SHX149

## 2024-09-05 HISTORY — PX: ESOPHAGOGASTRODUODENOSCOPY: SHX5428

## 2024-09-05 HISTORY — PX: BIOPSY OF SKIN SUBCUTANEOUS TISSUE AND/OR MUCOUS MEMBRANE: SHX6741

## 2024-09-05 HISTORY — PX: COLONOSCOPY: SHX5424

## 2024-09-05 LAB — GLUCOSE, CAPILLARY
Glucose-Capillary: 104 mg/dL — ABNORMAL HIGH (ref 70–99)
Glucose-Capillary: 146 mg/dL — ABNORMAL HIGH (ref 70–99)
Glucose-Capillary: 211 mg/dL — ABNORMAL HIGH (ref 70–99)
Glucose-Capillary: 272 mg/dL — ABNORMAL HIGH (ref 70–99)

## 2024-09-05 LAB — BASIC METABOLIC PANEL WITH GFR
Anion gap: 9 (ref 5–15)
BUN: 11 mg/dL (ref 6–20)
CO2: 23 mmol/L (ref 22–32)
Calcium: 9 mg/dL (ref 8.9–10.3)
Chloride: 107 mmol/L (ref 98–111)
Creatinine, Ser: 0.71 mg/dL (ref 0.44–1.00)
GFR, Estimated: >60 mL/min
Glucose, Bld: 113 mg/dL — ABNORMAL HIGH (ref 70–99)
Potassium: 3.8 mmol/L (ref 3.5–5.1)
Sodium: 139 mmol/L (ref 135–145)

## 2024-09-05 LAB — CBC
HCT: 26.3 % — ABNORMAL LOW (ref 36.0–46.0)
Hemoglobin: 8.5 g/dL — ABNORMAL LOW (ref 12.0–15.0)
MCH: 27.4 pg (ref 26.0–34.0)
MCHC: 32.3 g/dL (ref 30.0–36.0)
MCV: 84.8 fL (ref 80.0–100.0)
Platelets: 371 K/uL (ref 150–400)
RBC: 3.1 MIL/uL — ABNORMAL LOW (ref 3.87–5.11)
RDW: 14.6 % (ref 11.5–15.5)
WBC: 7 K/uL (ref 4.0–10.5)
nRBC: 0 % (ref 0.0–0.2)

## 2024-09-05 LAB — TROPONIN T, HIGH SENSITIVITY: Troponin T High Sensitivity: <15 ng/L (ref 0–19)

## 2024-09-05 SURGERY — COLONOSCOPY
Anesthesia: Monitor Anesthesia Care

## 2024-09-05 MED ORDER — LIDOCAINE 5 % EX PTCH
3.0000 | MEDICATED_PATCH | CUTANEOUS | Status: DC
  Administered 2024-09-05: 3 via TRANSDERMAL
  Filled 2024-09-05: qty 3

## 2024-09-05 MED ORDER — SODIUM CHLORIDE 0.9 % IV SOLN: INTRAVENOUS | Status: DC | PRN

## 2024-09-05 MED ORDER — LIDOCAINE 2% (20 MG/ML) 5 ML SYRINGE
INTRAMUSCULAR | Status: DC | PRN
  Administered 2024-09-05: 80 mg via INTRAVENOUS

## 2024-09-05 MED ORDER — DEXMEDETOMIDINE HCL IN NACL 80 MCG/20ML IV SOLN
INTRAVENOUS | Status: DC | PRN
  Administered 2024-09-05: 8 ug via INTRAVENOUS

## 2024-09-05 MED ORDER — DICYCLOMINE HCL 10 MG PO CAPS
20.0000 mg | ORAL_CAPSULE | Freq: Three times a day (TID) | ORAL | Status: DC | PRN
  Administered 2024-09-05: 20 mg via ORAL
  Filled 2024-09-05 (×3): qty 2

## 2024-09-05 MED ORDER — PROPOFOL 500 MG/50ML IV EMUL
INTRAVENOUS | Status: DC | PRN
  Administered 2024-09-05: 180 ug/kg/min via INTRAVENOUS

## 2024-09-05 MED ORDER — PROPOFOL 10 MG/ML IV BOLUS
INTRAVENOUS | Status: DC | PRN
  Administered 2024-09-05 (×2): 30 mg via INTRAVENOUS
  Administered 2024-09-05: 20 mg via INTRAVENOUS

## 2024-09-05 MED ORDER — ONDANSETRON HCL 4 MG/2ML IJ SOLN
INTRAMUSCULAR | Status: DC | PRN
  Administered 2024-09-05: 4 mg via INTRAVENOUS

## 2024-09-05 MED ORDER — INSULIN ASPART 100 UNIT/ML IJ SOLN
3.0000 [IU] | Freq: Once | INTRAMUSCULAR | Status: AC
  Administered 2024-09-05: 3 [IU] via SUBCUTANEOUS
  Filled 2024-09-05: qty 3

## 2024-09-05 NOTE — Progress Notes (Signed)
 During preop assessment for pt's endo procedure, pt stated she was having L sided chest pain that radiated into L arm; describes pain as pressure in his chest and numbness down her L arm, notified pt's primary team who came to bedside, EKG done and troponin drawn and sent. GI MD and Anesthesia aware

## 2024-09-05 NOTE — Anesthesia Postprocedure Evaluation (Signed)
"   Anesthesia Post Note  Patient: Shelby Brown  Procedure(s) Performed: COLONOSCOPY EGD (ESOPHAGOGASTRODUODENOSCOPY) BIOPSY, SKIN, SUBCUTANEOUS TISSUE, OR MUCOUS MEMBRANE POLYPECTOMY, INTESTINE     Patient location during evaluation: Endoscopy Anesthesia Type: MAC Level of consciousness: awake and alert Pain management: pain level controlled Vital Signs Assessment: post-procedure vital signs reviewed and stable Respiratory status: spontaneous breathing, nonlabored ventilation and respiratory function stable Cardiovascular status: stable and blood pressure returned to baseline Postop Assessment: no apparent nausea or vomiting Anesthetic complications: no   No notable events documented.  Last Vitals:  Vitals:   09/05/24 1150 09/05/24 1200  BP: 104/61 111/67  Pulse: 66 68  Resp: 10 16  Temp:    SpO2: 99% 100%    Last Pain:  Vitals:   09/05/24 1138  TempSrc: Temporal  PainSc:                  Shelby Brown,W. EDMOND      "

## 2024-09-05 NOTE — Interval H&P Note (Signed)
 History and Physical Interval Note: Patient coming for upper and lower endoscopy to evaluate borderline iron deficiency anemia, chronic diarrhea. Down here she was having atypical chest pain.  She was evaluated by hospitalist/internal medicine team and EKG showed no changes of ischemia.  Troponin was undetectable.  She states this pain is chronic and comes and goes could be positional.  Can radiate towards her face.  No dyspnea.  No pain at present. After discussion with her internal medicine team, anesthesia CRNA we will proceed with MAC for upper and lower endoscopy.  The nature of the procedure, as well as the risks, benefits, and alternatives were carefully and thoroughly reviewed with the patient. Ample time for discussion and questions allowed. The patient understood, was satisfied, and agreed to proceed.   09/05/2024 10:14 AM  Giacomo Senegal  has presented today for surgery, with the diagnosis of diarreha, abdominal pain, anemia.  The various methods of treatment have been discussed with the patient and family. After consideration of risks, benefits and other options for treatment, the patient has consented to  Procedures: COLONOSCOPY (N/A) EGD (ESOPHAGOGASTRODUODENOSCOPY) (N/A) as a surgical intervention.  The patient's history has been reviewed, patient examined, no change in status, stable for surgery.  I have reviewed the patient's chart and labs.  Questions were answered to the patient's satisfaction.     Gordy HERO Kie Calvin

## 2024-09-05 NOTE — Plan of Care (Signed)
 MD at bedside in endoscopy suite to evaluate patient after concerns for chest pain. Patient reports she has back pain on her left side that will move up to her chest. It hurts to touch her chest. She denies any shortness of breath or trouble breathing. Denies palpitations or chest pressure. Has had this pain off and on at home sometimes, feels its worse when she is nervous.  VSS. TTP along left anterior chest wall and posterior L axilla. - EKG obtained and reviewed, no concerns for ST changes - Troponin pending - low concern for ACS at this time, suspect pain is MSK in nature with a component of anxiety - communicated with GI & anesthesia

## 2024-09-05 NOTE — Transfer of Care (Signed)
 Immediate Anesthesia Transfer of Care Note  Patient: Silveria Campos-Ramirez  Procedure(s) Performed: COLONOSCOPY EGD (ESOPHAGOGASTRODUODENOSCOPY) BIOPSY, SKIN, SUBCUTANEOUS TISSUE, OR MUCOUS MEMBRANE POLYPECTOMY, INTESTINE  Patient Location: Endoscopy Unit  Anesthesia Type:MAC  Level of Consciousness: sedated and drowsy  Airway & Oxygen Therapy: Patient Spontanous Breathing and Patient connected to face mask oxygen  Post-op Assessment: Report given to RN, Post -op Vital signs reviewed and stable, and Patient moving all extremities X 4  Post vital signs: Reviewed and stable  Last Vitals:  Vitals Value Taken Time  BP 94/56 09/05/24 11:38  Temp 36.2 C 09/05/24 11:38  Pulse 64 09/05/24 11:39  Resp 25 09/05/24 11:39  SpO2 100 % 09/05/24 11:39  Vitals shown include unfiled device data.  Last Pain:  Vitals:   09/05/24 1138  TempSrc: Temporal  PainSc:          Complications: No notable events documented.

## 2024-09-05 NOTE — Assessment & Plan Note (Addendum)
 HLD: lipitor 20mg  daily Anemia: continue ferrous sulfate  T2DM: CBGs stable, no SSI required last 24h, no change to current plan. Hold home insulin , CBG monitoring, blood glucoses within range last 24 hours.   -Continue SSI Sensitive

## 2024-09-05 NOTE — Assessment & Plan Note (Addendum)
 VSS. GI pathogen panel negative. AM labs and BP stable after bowel prep yesterday.  -GI Lab workup to date: CRP WNL. C diff, GI pathogen panel and celiac testing negative -GI consult  -EGD & colonoscopy 12/19 -RD consulted -AM BMP -Orthostatic vitals - Compression stockings

## 2024-09-05 NOTE — Op Note (Addendum)
 [facility] Patient Name: Shelby Brown Procedure Date : 09/05/2024 MRN: [id number] Attending MD: Gordy CHRISTELLA Starch , MD, [id number] Date of Birth: October 19, 1980 CSN: [id number] Age: 43 Admit Type: Inpatient Procedure:                Upper GI endoscopy Indications:              Iron deficiency anemia, chronic diarrhea Providers:                Gordy CHRISTELLA. Starch, MD, Gregoria Pierce, RN, Corene Southgate, Technician Referring MD:             [facility] Medicines:                Monitored Anesthesia Care Complications:            No immediate complications. Estimated Blood Loss:     Estimated blood loss: none. Procedure:                Pre-Anesthesia Assessment:                           - Prior to the procedure, a History and Physical                            was performed, and patient medications and                            allergies were reviewed. The patient's tolerance of                            previous anesthesia was also reviewed. The risks                            and benefits of the procedure and the sedation                            options and risks were discussed with the patient.                            All questions were answered, and informed consent                            was obtained. Prior Anticoagulants: The patient has                            taken no anticoagulant or antiplatelet agents. ASA                            Grade Assessment: II - A patient with mild systemic                            disease. After reviewing the risks and benefits,  the patient was deemed in satisfactory condition to                            undergo the procedure.                           After obtaining informed consent, the endoscope was                            passed under direct vision. Throughout the                            procedure, the patient's blood pressure, pulse, and                             oxygen saturations were monitored continuously. The                            [id number]-[id number] ([id number]) Olympus endoscope was introduced                            through the mouth, and advanced to the second part                            of duodenum. The upper GI endoscopy was                            accomplished without difficulty. The patient                            tolerated the procedure well. Scope In: Scope Out: Findings:      The examined esophagus was normal.      The entire examined stomach was normal. Biopsies were taken with a cold       forceps for Helicobacter pylori testing.      The examined duodenum was normal. Biopsies for histology were taken with       a cold forceps for evaluation of celiac disease. Impression:               - Normal esophagus.                           - Normal stomach. Biopsied.                           - Normal examined duodenum. Biopsied. Moderate Sedation:      N/A Recommendation:           - Patient has a contact number available for                            emergencies. The signs and symptoms of potential                            delayed complications were discussed with the  patient. Return to normal activities tomorrow.                            Written discharge instructions were provided to the                            patient.                           - Resume previous diet.                           - Continue present medications.                           - Await pathology results.                           - See the other procedure note for documentation of                            additional recommendations. Procedure Code(s):        --- Professional ---                           [phone], Esophagogastroduodenoscopy, flexible,                            transoral; with biopsy, single or multiple Diagnosis Code(s):        --- Professional ---                            D50.9, Iron deficiency anemia, unspecified                           R19.7, Diarrhea, unspecified CPT copyright 2022 American Medical Association. All rights reserved. The codes documented in this report are preliminary and upon coder review may  be revised to meet current compliance requirements. Gordy CHRISTELLA Starch, MD 09/05/2024 11:02:14 AM This report has been signed electronically. Number of Addenda: 0

## 2024-09-05 NOTE — Assessment & Plan Note (Signed)
 Patient w/ L sided chest pain this AM, most likely costochondritis, differential also includes precordial catch. Low suspicion of ACS. -EKG reassuring  -First troponin WNL.  -Plan to reassess after procedure -Can trial lidocaine  patch, voltaren gel, heating pad PRN

## 2024-09-05 NOTE — Progress Notes (Addendum)
 "    Daily Progress Note Intern Pager: [phone]  Patient name: Shelby Brown Medical record number: [id number] Date of birth: 1981/03/13 Age: 43 y.o. Gender: female  Primary Care Provider: Adele Song, MD Consultants: GI Code Status: Full  Pt Overview and Major Events to Date:  12/16: Admitted 12/19: Upper and lower endoscopy  Medical Decision Making:  43 year old female admitted for hypotension and dizziness.  Likely secondary to combination of chronic anemia, autonomic dysfunction and poor p.o. intake at baseline.  This poor p.o. intake is secondary to chronic diarrhea which we are now working up.  Her workup to this point with testing for celiac disease, C. difficile, GI pathogen panels have all been WNL.  Upper and lower endoscopy today, plan moving forward pending results. Assessment & Plan Chronic diarrhea Dehydration VSS. GI pathogen panel negative. AM labs and BP stable after bowel prep yesterday.  -GI Lab workup to date: CRP WNL. C diff, GI pathogen panel and celiac testing negative -GI consult  -EGD & colonoscopy 12/19 -RD consulted -AM BMP -Orthostatic vitals - Compression stockings  Chest pain Patient w/ L sided chest pain this AM, most likely costochondritis, differential also includes precordial catch. Low suspicion of ACS. -EKG reassuring  -First troponin WNL.  -Plan to reassess after procedure -Can trial lidocaine  patch, voltaren gel, heating pad PRN Chronic health problem HLD: lipitor 20mg  daily Anemia: continue ferrous sulfate  T2DM: CBGs stable, no SSI required last 24h, no change to current plan. Hold home insulin , CBG monitoring, blood glucoses within range last 24 hours.   -Continue SSI Sensitive   FEN/GI: Carb modified post procedure PPx: Lovenox  Dispo:Home tomorrow. Barriers include upper and lower endoscopy.   Subjective:  Patient endorsing left-sided chest pain with left arm numbness this morning prior to her procedure.  She reports  this has been happening intermittently for the last 4 months, with chest pain across her left-sided posterior ribs and anterior chest that feels like a pinching.  It is tender with palpation.  She reports that it gets worse when she is feeling nervous.  She denies palpitations or shortness of breath this morning, .   Objective: Temp:  [97.8 F (36.6 C)-98.5 F (36.9 C)] 98.2 F (36.8 C) (12/19 0342) Pulse Rate:  [75-88] 79 (12/19 0523) Resp:  [16-17] 17 (12/19 0342) BP: (98-143)/(63-82) 140/73 (12/19 0523) SpO2:  [97 %-100 %] 100 % (12/19 0342) Physical Exam: General: Awake, alert, NAD. Communicates clearly. HEENT: NCAT. MMM.  Cardio: RRR. Normal S1, S2. No murmur, rub, gallop. 2+ radial and dorsalis pedis pulses b/l w/ good capillary refill. No LE edema.  Resp: CTA bilaterally. No wheezes, rales, or rhonchi. Normal work of breathing on room air Abdomen: Mild TTP diffusely, soft. Mild guarding.   Laboratory: Most recent CBC Lab Results  Component Value Date   WBC 7.4 09/04/2024   HGB 8.6 (L) 09/04/2024   HCT 26.4 (L) 09/04/2024   MCV 85.7 09/04/2024   PLT 366 09/04/2024   Most recent BMP    Latest Ref Rng & Units 09/04/2024    4:37 AM  BMP  Glucose 70 - 99 mg/dL 87   BUN 6 - 20 mg/dL 14   Creatinine 9.55 - 1.00 mg/dL 9.30   Sodium 864 - 854 mmol/L 139   Potassium 3.5 - 5.1 mmol/L 4.0   Chloride 98 - 111 mmol/L 110   CO2 22 - 32 mmol/L 22   Calcium  8.9 - 10.3 mg/dL 8.6    Trop WNL  Imaging/Diagnostic Tests:  EKG:  Sinus rhythm.  No acute ST changes or T wave inversions.   Manon Jester, DO 09/05/2024, 7:37 AM  PGY-1, [facility] FPTS Intern pager: [phone], text pages welcome Secure chat group [facility] Teaching Service   "

## 2024-09-05 NOTE — Anesthesia Preprocedure Evaluation (Addendum)
"                                    Anesthesia Evaluation  Patient identified by MRN, date of birth, ID band Patient awake    Reviewed: Allergy & Precautions, H&P , NPO status , Patient's Chart, lab work & pertinent test results  Airway Mallampati: II  TM Distance: >3 FB Neck ROM: Full    Dental no notable dental hx. (+) Teeth Intact, Dental Advisory Given   Pulmonary neg pulmonary ROS   Pulmonary exam normal breath sounds clear to auscultation       Cardiovascular negative cardio ROS  Rhythm:Regular Rate:Normal     Neuro/Psych  Headaches   Depression       GI/Hepatic negative GI ROS, Neg liver ROS,,,  Endo/Other  diabetes, Insulin  Dependent, Oral Hypoglycemic Agents    Renal/GU negative Renal ROS  negative genitourinary   Musculoskeletal   Abdominal   Peds  Hematology  (+) Blood dyscrasia, anemia   Anesthesia Other Findings   Reproductive/Obstetrics negative OB ROS                              Anesthesia Physical Anesthesia Plan  ASA: 3  Anesthesia Plan: MAC   Post-op Pain Management: Minimal or no pain anticipated   Induction: Intravenous  PONV Risk Score and Plan: 2 and Propofol infusion and Treatment may vary due to age or medical condition  Airway Management Planned: Natural Airway and Simple Face Mask  Additional Equipment:   Intra-op Plan:   Post-operative Plan:   Informed Consent: I have reviewed the patients History and Physical, chart, labs and discussed the procedure including the risks, benefits and alternatives for the proposed anesthesia with the patient or authorized representative who has indicated his/her understanding and acceptance.     Dental advisory given and Interpreter used for interview  Plan Discussed with: CRNA  Anesthesia Plan Comments:          Anesthesia Quick Evaluation  "

## 2024-09-05 NOTE — Op Note (Signed)
 [facility] Patient Name: Shelby Brown Procedure Date : 09/05/2024 MRN: [id number] Attending MD: Gordy CHRISTELLA Starch , MD, [id number] Date of Birth: Jan 20, 1981 CSN: [id number] Age: 43 Admit Type: Inpatient Procedure:                Colonoscopy Indications:              Chronic diarrhea, Iron deficiency anemia Providers:                Gordy CHRISTELLA. Starch, MD, Gregoria Pierce, RN, Corene Southgate, Technician Referring MD:             [facility] Medicines:                Monitored Anesthesia Care Complications:            No immediate complications. Estimated Blood Loss:     Estimated blood loss was minimal. Procedure:                Pre-Anesthesia Assessment:                           - Prior to the procedure, a History and Physical                            was performed, and patient medications and                            allergies were reviewed. The patient's tolerance of                            previous anesthesia was also reviewed. The risks                            and benefits of the procedure and the sedation                            options and risks were discussed with the patient.                            All questions were answered, and informed consent                            was obtained. Prior Anticoagulants: The patient has                            taken no anticoagulant or antiplatelet agents. ASA                            Grade Assessment: II - A patient with mild systemic                            disease. After reviewing the risks and benefits,  the patient was deemed in satisfactory condition to                            undergo the procedure.                           After obtaining informed consent, the colonoscope                            was passed under direct vision. Throughout the                            procedure, the patient's blood pressure, pulse, and                             oxygen saturations were monitored continuously. The                            [id number] ([id number]) Olympus colonoscope was                            introduced through the anus and advanced to the                            terminal ileum. The colonoscopy was performed                            without difficulty. The patient tolerated the                            procedure well. The quality of the bowel                            preparation was good. The terminal ileum, ileocecal                            valve, appendiceal orifice, and rectum were                            photographed. Scope In: 11:04:21 AM Scope Out: 11:27:29 AM Scope Withdrawal Time: 0 hours 20 minutes 58 seconds  Total Procedure Duration: 0 hours 23 minutes 8 seconds  Findings:      The digital rectal exam was normal.      The terminal ileum appeared normal.      An 18 mm polyp was found in the ascending colon. The polyp was sessile.       The polyp was removed with a cold snare. Resection and retrieval were       complete.      An 8 mm polyp was found in the proximal transverse colon. The polyp was       sessile. The polyp was removed with a cold snare. Resection and       retrieval were complete.      The exam was otherwise without abnormality on direct and retroflexion       views.  Biopsies for histology were taken with a cold forceps from the right       colon and left colon for evaluation of microscopic colitis. Impression:               - The examined portion of the ileum was normal.                           - One 18 mm polyp in the ascending colon, removed                            with a cold snare. Resected and retrieved.                           - One 8 mm polyp in the proximal transverse colon,                            removed with a cold snare. Resected and retrieved.                           - The examination was otherwise normal on direct                             and retroflexion views.                           - Biopsies were taken with a cold forceps from the                            right colon and left colon for evaluation of                            microscopic colitis. Moderate Sedation:      N/A Recommendation:           - Patient has a contact number available for                            emergencies. The signs and symptoms of potential                            delayed complications were discussed with the                            patient. Return to normal activities tomorrow.                            Written discharge instructions were provided to the                            patient.                           - Resume previous diet.                           -  Continue present medications.                           - Await pathology results.                           - Oral iron once daily. Monitor Hgb and iron                            studies as an outpatient.                           - Dicyclomine 20 mg can be used TID-PRN for crampy                            abdominal pain and diarrhea.                           - Repeat colonoscopy is recommended for                            surveillance. The colonoscopy date will be                            determined after pathology results from today's                            exam become available for review.                           - GI will sign off, call if questions. Procedure Code(s):        --- Professional ---                           [phone], Colonoscopy, flexible; with removal of                            tumor(s), polyp(s), or other lesion(s) by snare                            technique                           [id number], 59, Colonoscopy, flexible; with biopsy,                            single or multiple Diagnosis Code(s):        --- Professional ---                           D12.2, Benign neoplasm of ascending colon                            D12.3, Benign neoplasm of transverse colon (hepatic  flexure or splenic flexure)                           K52.9, Noninfective gastroenteritis and colitis,                            unspecified                           D50.9, Iron deficiency anemia, unspecified CPT copyright 2022 American Medical Association. All rights reserved. The codes documented in this report are preliminary and upon coder review may  be revised to meet current compliance requirements. Gordy CHRISTELLA Starch, MD 09/05/2024 11:43:37 AM This report has been signed electronically. Number of Addenda: 0

## 2024-09-06 ENCOUNTER — Other Ambulatory Visit (HOSPITAL_COMMUNITY): Payer: Self-pay

## 2024-09-06 LAB — BASIC METABOLIC PANEL WITH GFR
Anion gap: 7 (ref 5–15)
BUN: 17 mg/dL (ref 6–20)
CO2: 27 mmol/L (ref 22–32)
Calcium: 8.9 mg/dL (ref 8.9–10.3)
Chloride: 103 mmol/L (ref 98–111)
Creatinine, Ser: 0.77 mg/dL (ref 0.44–1.00)
GFR, Estimated: >60 mL/min
Glucose, Bld: 149 mg/dL — ABNORMAL HIGH (ref 70–99)
Potassium: 4.1 mmol/L (ref 3.5–5.1)
Sodium: 137 mmol/L (ref 135–145)

## 2024-09-06 LAB — GLUCOSE, CAPILLARY: Glucose-Capillary: 147 mg/dL — ABNORMAL HIGH (ref 70–99)

## 2024-09-06 MED ORDER — DICYCLOMINE HCL 10 MG PO CAPS
20.0000 mg | ORAL_CAPSULE | Freq: Three times a day (TID) | ORAL | 0 refills | Status: AC | PRN
  Filled 2024-09-06: qty 30, 5d supply, fill #0

## 2024-09-06 NOTE — Progress Notes (Signed)
 Discharge instructions and prescriptions given to it. Video Spanish interpreter was utilized. Discussed signs and symptoms to report to the MD, upcoming appointments, and meds. Pt verbalizes understanding and has no questions or concerns at this time. Pt discharged home from hospital with walker in stable condition.

## 2024-09-06 NOTE — Progress Notes (Signed)
 Pt stated that she was supposed to be sent home with a cane or walker on her last admission to the hospital, but she never got it. TOC social work order placed. This RN notified Clayborne Shady, the social worker on call today. She will contact the [facility] case manager and get things sorted out for this pt.

## 2024-09-06 NOTE — Discharge Instructions (Addendum)
 Thank you for letting us  care for you during your stay.  You were admitted to the [facility] Medicine Teaching Service with low blood pressure and dizziness. We were concerned that your symptoms are due limited food and water intake, which you said was due to diarrhea. We did blood tests that ruled out common disorders in the intestine. You got imaging of your GI tract with endoscopy and colonoscopy which found no specific visible disorder or changes to explain your symptoms. We believe you would benefit from a modified diet to reduce your intestinal distress, which will help you eat and drink comfortably, which will help your dizziness.   Given Christmas next week, we schedule you for Tuesday at 8:30am.   If your symptoms worsen or return, please return to the hospital.  Please let us  know if you have questions about your stay at [facility]   En Espanol Gracias por permitirnos cuidar de usted durante su estada.  Fue admitido en el Servicio de Colon [facility] con presin arterial baja y [place]. Nos preocupaba que sus sntomas se debieran a la ingesta limitada de alimentos y agua, lo cual usted mencion que se deba a technical sales engineer. Realizamos anlisis de sangre que descartan trastornos intestinales comunes. Se le realizaron estudios de imagen de su tracto gastrointestinal mediante endoscopia y colonoscopia, los cuales no encontraron ningn trastorno o cambio visible especfico que explicara sus sntomas. Creemos que se beneficiara de una dieta modificada para reducir civil service fast streamer intestinal, lo que le ayudar a comer y geologist, engineering, y a su vez human resources officer.  Debido a la [employer] prxima semana, le hemos programado la cita para el martes a las 8:30 a.m.  Si sus sntomas empeoran o reaparecen, por favor regrese al hospital.  Por favor, hganos saber si tiene alguna pregunta sobre su estada en el Hospital .

## 2024-09-06 NOTE — Assessment & Plan Note (Deleted)
 VSS. Labs WNL, hgb improved.  - Change to 1/51mIVF  -RD consulted -AM BMP -Orthostatic vitals - Compression stockings

## 2024-09-06 NOTE — Discharge Summary (Addendum)
 "  Family Medicine Teaching [facility] Discharge Summary  Patient name: Shelby Brown Medical record number: [id number] Date of birth: 1981/02/05 Age: 43 y.o. Gender: female Date of Admission: 09/02/2024  Date of Discharge: 09-06-24 Admitting Physician: Leafy Scriver, DO  Primary Care Provider: Adele Song, MD Consultants: GI  Indication for Hospitalization: Hypotension  Discharge Diagnoses/Problem List:  Principal Problem for Admission: Hypotension Other Problems addressed during stay:  Active Problems:   Chest pain   Orthostatic hypotension   Dehydration   Chronic diarrhea   Iron deficiency anemia   Benign neoplasm of ascending colon   Benign neoplasm of transverse colon   Brief Hospital Course:  Shelby Brown is a 43 y.o.female with a history of recent pyelonephritis 12/25, T2DM, chronic anemia who was admitted to the [facility] Medicine Teaching Service at [facility] for hypotension. Her hospital course is detailed below:  Hypotension  Patient presented to the ED due to hypotension (60s/40s) found during her clinic visit. She received IVF and improved with no more episodes of hypotension, though was orthostatic. Labs were wnl. Patient had been eating less due to having diarrhea when eating. GI was consulted for normocytic anemia and diarrhea. EGD and colonoscopy largely unremarkable. Neg for C diff, Celiac, and GI pathogen panel. 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 Discharged home on prn bentyl    Of note, the morning of her upper and lower endoscopy she reported chest pain and L arm numbness. She described it as coming and going for the last 4 months, pinching type pain in the front and back of her L chest. Trop WNL and EKG reassuring.   Other chronic conditions were medically managed with home medications and formulary alternatives as necessary (type 2 diabetes)  PCP Follow-up Recommendations: F/u biopsies from upper and lower scopes Further discussion of dietary  changes for functional GI disorder to ensure patient and family understanding  Continued education on specific daily hydration goals Continue optimizing T2DM management Consider restarting gabapentin  given continued chronic pains Continue further work up for anemia      Results/Tests Pending at Time of Discharge:    Disposition: Home  Discharge Condition: VSS, asymptomatic  Discharge Exam:  Vitals:   09/06/24 0404 09/06/24 0751  BP: (!) 141/62 130/67  Pulse:  78  Resp: 15 16  Temp: 98.2 F (36.8 C) 98.2 F (36.8 C)  SpO2: 100% 100%   General: Awake, alert, NAD. Communicates clearly via interpreter. Cardio: RRR. Normal S1, S2. No murmur, rub, gallop. 2+ radial and dorsalis pedis pulses b/l w/ good capillary refill. No LE edema. No JVD. Resp: CTA bilaterally. No wheezes, rales, or rhonchi. Normal work of breathing on room air Abdomen: soft, non-tender, non-distended. Normoactive BS auscultated. No guarding, rigidity, or rebound.  Neuro: AOx4. No focal deficits.    Significant Procedures: Upper and lower endoscopy, unrmarkable   Significant Labs and Imaging:  Recent Labs  Lab 09/05/24 0642  WBC 7.0  HGB 8.5*  HCT 26.3*  PLT 371   Recent Labs  Lab 09/05/24 0637 09/06/24 0622  NA 139 137  K 3.8 4.1  CL 107 103  CO2 23 27  GLUCOSE 113* 149*  BUN 11 17  CREATININE 0.71 0.77  CALCIUM  9.0 8.9    KUB 12/17 IMPRESSION: Nonobstructive bowel gas pattern.    Discharge Medications:  Allergies as of 09/06/2024       Reactions   Metformin  And Related Nausea Only   Poor PO intake and treated with prescription antiemetic agents  Medication List     PAUSE taking these medications    famotidine  20 MG tablet Wait to take this until your doctor or other care provider tells you to start again. Commonly known as: PEPCID  Tome 1 tableta (20 mg en total) por va oral diariamente. (Take 1 tablet (20 mg total) by mouth daily.)   fluconazole  150 MG  tablet Wait to take this until your doctor or other care provider tells you to start again. Commonly known as: DIFLUCAN  Take 150 mg by mouth daily.   gabapentin  100 MG capsule Wait to take this until your doctor or other care provider tells you to start again. Commonly known as: NEURONTIN  Tomar 1 cpsula (100 mg en total) por va oral 3 (tres) veces al [facility]. (Take 1 capsule (100 mg total) by mouth 3 (three) times daily.)       TAKE these medications    acetaminophen  500 MG tablet Commonly known as: TYLENOL  Take 2 tablets (1,000 mg total) by mouth every 6 (six) hours as needed (first line pain).   atorvastatin  20 MG tablet Commonly known as: LIPITOR Take 1 tablet (20 mg total) by mouth daily.   Basaglar  KwikPen 100 UNIT/ML Inject 15 Units into the skin daily.   dicyclomine  10 MG capsule Commonly known as: BENTYL  Take 2 capsules (20 mg total) by mouth every 8 (eight) hours as needed for spasms (cramping).   FeroSul 325 (65 Fe) MG tablet Generic drug: ferrous sulfate  Tome 1 tableta (325 mg en total) por va oral [employer]. (Take 1 tablet (325 mg total) by mouth every other day.)   insulin  lispro 100 UNIT/ML KwikPen Commonly known as: HumaLOG  KwikPen Inject 4-5 Units into the skin daily with supper.   ondansetron  4 MG disintegrating tablet Commonly known as: ZOFRAN -ODT Tome 1 tableta (4 mg en total) por va oral cada 8 (ocho) horas segn sea necesario para las nuseas o los vmitos. (Take 1 tablet (4 mg total) by mouth every 8 (eight) hours as needed for nausea or vomiting.)   TRUEplus 5-Bevel Pen Needles 31G X 8 MM Misc Generic drug: Insulin  Pen Needle Use one pen needle twice daily.               Durable Medical Equipment  (From admission, onward)           Start     Ordered   09/06/24 1003  For home use only DME Walker rolling  Once       Question Answer Comment  Walker: With 5 Inch Wheels   Patient needs a walker to treat with the following  condition Weakness      09/06/24 1002            Discharge Instructions: Please refer to Patient Instructions section of EMR for full details.  Patient was counseled important signs and symptoms that should prompt return to medical care, changes in medications, dietary instructions, activity restrictions, and follow up appointments.   Follow-Up Appointments: Future Appointments  Date Time Provider Department Center  09/09/2024  8:30 AM Romelle Booty, MD [facility]   Manon Jester, DO 09/06/2024, 12:12 PM PGY-1, [facility]  Upper Level Attestation I agree with the history, physical, and assessment above  Lonnie Earnest, MD 09/06/2024, 12:25 PM PGY-2, [facility]  "

## 2024-09-06 NOTE — Progress Notes (Signed)
 SW acknowledged consult for walker.  SW left a voice mail message and a secure chat message for RN case production designer, theatre/television/film.   Clayborne Hope, MSW, LCSW Clinical Social Work [phone]

## 2024-09-06 NOTE — Assessment & Plan Note (Deleted)
 VSS. GI pathogen panel negative. AM labs and BP stable after bowel prep yesterday.  -GI Lab workup to date: CRP WNL. C diff, GI pathogen panel and celiac testing negative -GI consult  -EGD & colonoscopy 12/19 -RD consulted -AM BMP -Orthostatic vitals - Compression stockings

## 2024-09-06 NOTE — TOC Transition Note (Signed)
 Transition of Care [facility]) - Discharge Note   Patient Details  Name: Shelby Brown MRN: [id number] Date of Birth: June 22, 1981  [facility] CM/SW Contact:  Marval Gell, RN Phone Number: 09/06/2024, 10:03 AM   Clinical Narrative:     RW to be delivered to the room through [employer]         Patient Goals and CMS Choice            Discharge Placement                       Discharge Plan and Services Additional resources added to the After Visit Summary for                  DME Arranged: Walker rolling DME Agency: [employer] Date DME Agency Contacted: 09/06/24 Time DME Agency Contacted: 1003 Representative spoke with at DME Agency: [place]            Social Drivers of Health (SDOH) Interventions SDOH Screenings   Food Insecurity: Food Insecurity Present (09/03/2024)  Housing: High Risk (09/03/2024)  Transportation Needs: Unmet Transportation Needs (09/03/2024)  Utilities: At Risk (09/03/2024)  Depression (PHQ2-9): High Risk (09/02/2024)  Tobacco Use: Low Risk (09/05/2024)     Readmission Risk Interventions    08/23/2024   10:23 AM 12/28/2023    3:39 PM  Readmission Risk Prevention Plan  Post Dischage Appt  Complete  Medication Screening  Complete  Transportation Screening Complete Complete  PCP or Specialist Appt within 5-7 Days Complete   Home Care Screening Complete   Medication Review (RN CM) Referral to Pharmacy

## 2024-09-06 NOTE — Assessment & Plan Note (Deleted)
 Patient w/ L sided chest pain this AM, most likely costochondritis, differential also includes precordial catch. Low suspicion of ACS. -EKG reassuring  -First troponin WNL.  -Plan to reassess after procedure -Can trial lidocaine  patch, voltaren gel, heating pad PRN

## 2024-09-06 NOTE — Assessment & Plan Note (Deleted)
 HLD: lipitor 20mg  daily Anemia: continue ferrous sulfate  T2DM: CBGs stable, no SSI required last 24h, no change to current plan. Hold home insulin , CBG monitoring, blood glucoses within range last 24 hours.   -Continue SSI Sensitive

## 2024-09-07 ENCOUNTER — Encounter (HOSPITAL_COMMUNITY): Payer: Self-pay | Admitting: Internal Medicine

## 2024-09-08 ENCOUNTER — Ambulatory Visit: Payer: Self-pay | Admitting: Internal Medicine

## 2024-09-08 LAB — SURGICAL PATHOLOGY

## 2024-09-09 ENCOUNTER — Encounter: Payer: Self-pay | Admitting: Family Medicine

## 2024-09-09 ENCOUNTER — Inpatient Hospital Stay: Payer: Self-pay | Admitting: Family Medicine

## 2024-09-09 ENCOUNTER — Ambulatory Visit (INDEPENDENT_AMBULATORY_CARE_PROVIDER_SITE_OTHER): Payer: Self-pay | Admitting: Family Medicine

## 2024-09-09 VITALS — BP 90/60 | HR 92 | Ht 62.0 in | Wt 112.0 lb

## 2024-09-09 DIAGNOSIS — Z09 Encounter for follow-up examination after completed treatment for conditions other than malignant neoplasm: Secondary | ICD-10-CM

## 2024-09-09 DIAGNOSIS — E119 Type 2 diabetes mellitus without complications: Secondary | ICD-10-CM

## 2024-09-09 DIAGNOSIS — I959 Hypotension, unspecified: Secondary | ICD-10-CM

## 2024-09-09 MED ORDER — DEXCOM G7 SENSOR MISC: 11 refills | Status: AC

## 2024-09-09 NOTE — Progress Notes (Signed)
" ° ° °  SUBJECTIVE:   CHIEF COMPLAINT / HPI:   Hospital follow-up Admitted 12/16-12/24 hypotension.  She received IV fluids.  She underwent EGD and colonoscopy for anemia and diarrhea, these were unremarkable.  She was discharged on as needed Bentyl . DME order for rolling walker was sent.  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 Per GI: f/u colonoscopy in 3 years Further discussion of dietary changes for functional GI disorder to ensure patient and family understanding  Continued education on specific daily hydration goals Continue optimizing T2DM management Consider restarting gabapentin  given continued chronic pains Continue further work up for anemia  If still chronically hypotensive, consider midodrine   Discussed the use of AI scribe software for clinical note transcription with the patient, who gave verbal consent to proceed.  History of Present Illness Shelby Brown is a 43 year old female with diabetes who presents for follow-up after a recent hospital stay.  Orthostatic symptoms - Dizziness when standing, improves with walking. Symptoms have improved since leaving the hospital. Denies concerns today - Uses a rolling walker at home - Trying to stay hydrated since hospital discharge  Glycemic management - Diabetes mellitus managed with insulin  - Current regimen: 20 units glargine in the morning, 5 units Humalog  at night - Unable to check blood glucose since discharge due to lack of glucose monitor. Denies symptoms including abd pain, n/v  Colorectal and gastrointestinal findings - Recent colonoscopy with removal of several potentially precancerous polyps - Repeat colonoscopy recommended in three years - Mild irritation observed in stomach and  duodenum  Neuropathic pain - Uses gabapentin  occasionally for nerve pain     PERTINENT  PMH / PSH: Pyelonephritis, T2DM, chronic anemia  OBJECTIVE:   BP 90/60   Pulse 92   Ht 5' 2 (1.575 m)   Wt 112 lb (50.8 kg)   LMP 07/23/2024   SpO2 100%   BMI 20.49 kg/m    General: NAD, pleasant, able to participate in exam Cardiac: RRR, no murmurs auscultated Respiratory: CTAB, normal WOB Abdomen: soft, non-tender, non-distended, normoactive bowel sounds Extremities: warm and well perfused, no edema or cyanosis Skin: warm and dry, no rashes noted Neuro: alert, no obvious focal deficits, speech normal Psych: Normal affect and mood  ASSESSMENT/PLAN:    Assessment & Plan Hypotension, unspecified hypotension type Hospital discharge follow-up Asymptomatic, stable - Encouraged increased fluid intake to maintain hydration and manage blood pressure. - Advised continued use of walker to prevent falls and manage dizziness. - Instructed to seek medical attention if dizziness, lightheadedness, or falls occur.  colonoscopy showed non-cancerous polyps - Recommended follow-up colonoscopy in three years. Diabetes mellitus without complication (HCC) Sent refill of dexcom Continue insulin  20u LAI and 5u SAI as taking F/u within 40mo for A1c, last in november    Payton Coward, MD [facility] "

## 2024-09-09 NOTE — Patient Instructions (Signed)
" °  VISIT SUMMARY: You had a follow-up visit after your recent hospital stay. We discussed your dizziness, diabetes management, recent colonoscopy findings, and nerve pain.  YOUR PLAN: HYPOTENSION: You have been experiencing dizziness when standing, likely due to low blood pressure. -Increase your fluid intake to stay hydrated and help manage your blood pressure. -Continue using your walker to prevent falls and manage dizziness. -Seek medical attention if you experience dizziness, lightheadedness, or falls.  TYPE 2 DIABETES MELLITUS: Your diabetes is managed with insulin , but you have not been able to check your blood glucose levels since your hospital discharge. -Use the new Dexcom G7 sensor for continuous glucose monitoring. -Continue your current insulin  regimen: 20 units of glargine in the morning and 5 units of Humalog  at night. -Follow up within the next month to discuss your diabetes management and review your blood glucose readings.  GENERAL HEALTH MAINTENANCE: Your recent colonoscopy showed non-cancerous polyps and mild irritation in your stomach and duodenum. -No immediate treatment is required for the colonoscopy findings. -Schedule a follow-up colonoscopy in three years.                      Contains text generated by [employer].                                 Contains text generated by [employer].   "

## 2024-09-09 NOTE — Progress Notes (Deleted)
" ° ° °  SUBJECTIVE:   CHIEF COMPLAINT / HPI:   Hospital follow-up Admitted 12/16-12/24 hypotension.  She received IV fluids.  She underwent EGD and colonoscopy for anemia and diarrhea, these were unremarkable.  She was discharged on as needed Bentyl .  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  History of Present Illness      PERTINENT  PMH / PSH: ***  OBJECTIVE:   There were no vitals taken for this visit.   Physical Exam   ***  ASSESSMENT/PLAN:   Assessment and Plan Assessment & Plan     ***  Assessment & Plan    Payton Coward, MD [facility] "

## 2024-10-10 ENCOUNTER — Ambulatory Visit: Payer: Self-pay | Admitting: Pharmacist

## 2024-10-10 ENCOUNTER — Encounter: Payer: Self-pay | Admitting: Pharmacist

## 2024-10-10 ENCOUNTER — Ambulatory Visit: Payer: Self-pay | Admitting: Family Medicine

## 2024-10-10 VITALS — BP 93/54 | HR 94 | Wt 111.0 lb

## 2024-10-10 VITALS — BP 93/54 | HR 94 | Ht 62.0 in | Wt 111.0 lb

## 2024-10-10 DIAGNOSIS — I951 Orthostatic hypotension: Secondary | ICD-10-CM

## 2024-10-10 DIAGNOSIS — R42 Dizziness and giddiness: Secondary | ICD-10-CM

## 2024-10-10 DIAGNOSIS — E1165 Type 2 diabetes mellitus with hyperglycemia: Secondary | ICD-10-CM

## 2024-10-10 DIAGNOSIS — Z794 Long term (current) use of insulin: Secondary | ICD-10-CM

## 2024-10-10 LAB — POCT GLUCOSE FINGERSTICK: Glucose: 85 (ref 70–99)

## 2024-10-10 LAB — POCT HEMOGLOBIN: Hemoglobin: 9.5 g/dL — AB (ref 11–14.6)

## 2024-10-10 MED ORDER — MIDODRINE HCL 2.5 MG PO TABS: 2.5000 mg | ORAL_TABLET | Freq: Three times a day (TID) | ORAL | 0 refills | Status: AC

## 2024-10-10 MED ORDER — BASAGLAR KWIKPEN 100 UNIT/ML ~~LOC~~ SOPN: 20.0000 [IU] | PEN_INJECTOR | Freq: Every day | SUBCUTANEOUS | 3 refills | Status: AC

## 2024-10-10 MED ORDER — INSULIN LISPRO (1 UNIT DIAL) 100 UNIT/ML (KWIKPEN): 8.0000 [IU] | PEN_INJECTOR | Freq: Every day | SUBCUTANEOUS | 3 refills | Status: AC

## 2024-10-10 MED ORDER — GABAPENTIN 100 MG PO CAPS: 100.0000 mg | ORAL_CAPSULE | Freq: Three times a day (TID) | ORAL | 3 refills | Status: AC

## 2024-10-10 MED ORDER — ATORVASTATIN CALCIUM 20 MG PO TABS: 20.0000 mg | ORAL_TABLET | Freq: Every day | ORAL | 3 refills | Status: AC

## 2024-10-10 NOTE — Patient Instructions (Addendum)
 It was great to see you! Thank you for allowing me to participate in your care!  Our plans for today:   VISIT SUMMARY: Today, you came in because you have been feeling dizzy and lightheaded, especially when standing. We discussed your hydration, nutrition, and menstrual history.  YOUR PLAN: LIGHTHEADEDNESS AND ORTHOSTATIC SYMPTOMS: Your dizziness and lightheadedness are likely due to not drinking enough fluids and possibly some issues with your autonomic nervous system. -Increase your fluid intake to at least three bottles of water daily. -We checked your blood levels. - I am starting the medicine midodrine, he will take this 3 times a day with meals, hopefully this will help with your blood pressure    Contains text generated by [employer].    Please arrive 15 minutes PRIOR to your next scheduled appointment time! If you do not, this affects OTHER patients' care.  Take care and seek immediate care sooner if you develop any concerns.   Ozell Provencal, MD, PGY-3 [facility] 10:12 AM 10/10/2024  [facility]  Hipotensin ortosttica Orthostatic Hypotension La presin arterial mide con cunta fuerza, o debilidad, la sangre circulante presiona contra las paredes de las arterias. La hipotensin ortosttica es una disminucin repentina de la presin arterial que ocurre al cambiar de posicin, como cuando se pone de pie despus de estar recostado. Las arterias son los vasos sanguneos que transportan la sangre desde el corazn hacia todas las partes del cuerpo. Cuando la presin arterial es demasiado baja, puede ser que no llegue suficiente sangre al cerebro o al resto de sus rganos. La hipotensin ortosttica puede causar vahdos, sudoracin, latidos cardacos rpidos, visin borrosa y desmayos. Estos sntomas requieren que se investigue ms la causa. Cules son las causas? La hipotensin ortosttica puede deberse a ugi corporation, entre otras: Cambios repentinos en la  postura, por ejemplo, ponerse de pie rpidamente despus de haber estado sentado o acostado. Prdida de sangre (anemia) o prdida de lquidos corporales (deshidratacin). Problemas cardacos, problemas neurolgicos o problemas hormonales. Embarazo. Envejecimiento. El riesgo de esta afeccin aumenta a medida que envejece. Infeccin grave (sepsis). Ciertos medicamentos, como medicamentos para la presin arterial alta o medicamentos que hacen que el cuerpo pierda el exceso de lquidos (diurticos). Cules son los signos o sntomas? Los sntomas de esta afeccin pueden incluir los siguientes: Debilidad, vahdos o mareos. Sudoracin. Visin borrosa. Cansancio (fatiga). Latidos cardacos rpidos. Desmayos, cuando los casos son graves. Cmo se diagnostica? Esta afeccin se diagnostica en funcin de lo siguiente: Los sntomas y los antecedentes mdicos. La medicin de la presin arterial. El mdico le controlar la presin arterial cuando usted est: Acostado. Sentado. De pie. La lectura de la presin arterial se registra con dos nmeros, por ejemplo 120 sobre 80 (o 120/80). El gaffer (superior) es la presin sistlica. Es la medida de la presin de las arterias cuando el corazn late. El segundo nmero (inferior) es la presin diastlica. Es la medida de la presin en las arterias cuando el corazn se relaja entre latidos. La presin arterial se mide en una unidad llamada mmHg. Una presin arterial saludable para la mayora de los adultos es de 120/80 mmHg. La hipotensin ortosttica se define como una cada de 20 mmHg en la presin sistlica o una cada de 10 mmHg en la presin diastlica dentro de los 3 minutos de estar de pie. Entre las pruebas e informacin que pueden ayudar a education administrator la hipotensin ortosttica se incluyen las siguientes: Sus otros signos vitales, por ejemplo la frecuencia cardaca y  la temperatura. Pruebas de [place]. Un electrocardiograma (ECG) o un  ecocardiograma. Monitor Holter. Este es un dispositivo que se usa  y training and development officer el ritmo cardaco de manera continua, generalmente durante 24 a 48 horas. Prueba de basculacin. Para realizar esta prueba, se lo sujeta de forma segura a una mesa que lo mover de una posicin acostada a una posicin vertical. Durante la prueba, se controlarn el ritmo cardaco y la presin arterial. Cmo se trata? El tratamiento para esta afeccin puede incluir lo siguiente: Cambios en la dieta. Estos cambios implican comer con ms sal (sodio) o tomar ms agua. Cambiar la dosis de ciertos medicamentos que toma y que podran bajar su presin arterial. Corregir el motivo subyacente de la hipotensin ortosttica. Usar medias de compresin. Medicamentos para elevar la presin arterial. Evitar las acciones desencadenantes de los sntomas. Siga estas instrucciones en su casa: Medicamentos Use los medicamentos de venta libre y los recetados solamente como se lo haya indicado el mdico. Siga las instrucciones del mdico respecto del cambio de la dosis de sus medicamentos actuales, si corresponde. No deje de tomar los medicamentos ni modifique la dosis por su cuenta. Comida y bebida  Beber suficiente lquido como para pharmacologist la orina de color amarillo plido. Consuma la sal adicional nicamente como se lo hayan indicado. No agregue sal adicional a su dieta a menos que se lo indique el mdico. Haga comidas pequeas y frecuentes. Evite ponerse de pie de repente despus de comer. Instrucciones generales  Levntese despacio cuando est acostado o sentado. Esto posibilitar que la presin arterial se adapte. Evite las duchas calientes o el calor excesivo como se lo haya indicado el mdico. Haga actividad fsica regularmente como se lo haya indicado el mdico. Si tiene medias de compresin, selas como se lo hayan indicado. Concurra a todas las visitas de seguimiento. Esto es importante. Comunquese con un mdico  si: Tiene fiebre por ms de 2 a 3 das. Tiene ms sed que lo habitual. Se siente mareado o dbil. Solicite ayuda de inmediato si: Midwife. Tiene latidos cardacos rpidos o irregulares. Est sudoroso o siente mareos. Siente que le falta el aire. Se desmaya. Tiene sntomas de un accidente cerebrovascular. BE FAST es una manera fcil de recordar las principales seales de advertencia de un accidente cerebrovascular: B: Balance (equilibrio). Los signos son mareos, dificultad repentina para caminar o prdida del equilibrio. E: Eyes (ojos). Los signos son problemas para ver o un cambio repentino en la visin. F: Face (rostro). Los signos son debilidad repentina o adormecimiento del rostro, o el rostro o el prpado que se caen hacia un lado. A: Arms (brazos). Los signos son debilidad o adormecimiento en un brazo. Esto sucede de repente y generalmente en un lado del cuerpo. S: Speech (habla). Los signos son dificultad para hablar, hablar arrastrando las palabras o dificultad para comprender lo que las forensic scientist. T: Time (tiempo). Es tiempo de llamar al servicio de sports administrator. Anote la hora a la que albertson's sntomas. Presenta otros signos de un accidente cerebrovascular, como los siguientes: Dolor de cabeza sbito e intenso que no tiene causa aparente. Nuseas o vmitos. Convulsiones. Estos sntomas pueden representar un problema grave que constituye radio broadcast assistant. No espere a ver si los sntomas desaparecen. Solicite atencin mdica de inmediato. Comunquese con el servicio de emergencias de su localidad (911 en los [place]). No conduzca por sus propios medios [facility]. Resumen La hipotensin ortosttica es la cada sbita de la presin arterial. Puede causar  vahdos, sudoracin, latidos cardacos rpidos, visin borrosa y desmayos. La hipotensin ortosttica se diagnostica midindole la presin arterial mientras est acostado, sentado y luego de  pie. El tratamiento puede implicar cambiar su dieta, usar medias de compresin, sentarse lentamente, ajustar sus medicamentos o corregir el motivo subyacente de la hipotensin arboriculturist. Obtenga ayuda de inmediato si tiene dolor de pecho, latidos cardacos rpidos o irregulares, o sntomas de un accidente cerebrovascular. Esta informacin no tiene theme park manager el consejo del mdico. Asegrese de hacerle al mdico cualquier pregunta que tenga. Document Revised: 12/08/2020 Document Reviewed: 12/07/2020 Elsevier Patient Education  2024 [employer].

## 2024-10-10 NOTE — Patient Instructions (Signed)
 It was nice to see you today!  Your goal glucose value is 80-130 before eating and less than 180 after eating.  Medication Changes: RE-START atorvastatin  20 mg daily and famotidine  20 mg daily.   Increase Basaglar  (insulin  glargine) from 15 units daily to 20 units daily.   Increase Humalog  (insulin  lispro) from 5 units with dinner to 8 units with dinner.   Continue all other medication the same.  Keep up the good work with diet and exercise. Aim for a diet full of vegetables, fruit and lean meats (chicken, turkey, fish). Try to limit salt intake by eating fresh or frozen vegetables (instead of canned), rinse canned vegetables prior to cooking and do not add any additional salt to meals.   Please bring all medications to your clinic visits.  Please arrive 10-15 minutes prior to your scheduled visit time.

## 2024-10-10 NOTE — Progress Notes (Signed)
" ° ° °  SUBJECTIVE:   CHIEF COMPLAINT / HPI: Dizziness  Discussed the use of AI scribe software for clinical note transcription with the patient, who gave verbal consent to proceed.  History of Present Illness Shelby Brown is a 44 year old female who presents with dizziness and lightheadedness. She is accompanied by her mother.  Dizziness and lightheadedness - Nonvertiginous dizziness and lightheadedness, especially when going standing - Described as a headache with a sensation of nearly falling - Symptoms improve with sitting - Associated tachycardia when upright  Hydration and nutrition status - Symptoms worsen when she has not eaten or drunk enough - Today had minimal food and fluids due to early visit - Usually eats twice daily - Drinks about three bottles of water per day  Menstrual history - Last menstrual period was eight days ago - Menstrual bleeding was lighter than usual - No new bleeding - Not taking an iron supplement  - Recently hospitalized in December for orthostatic hypotension.   - Workup at that time without clear cause, was attributed to chronic diarrhea with and chronic anemia. - Last hemoglobin 1 month ago was 8.5 - Has previously had low-normal ferritin - Had colonoscopy in December that did not show obvious cause of anemia - Has had multiple clinic visits for similar presentations - Today endorsed symptoms at clinic visit with Dr. Amalia regarding her diabetes  PERTINENT  PMH / PSH: History of hypotension, type 2 diabetes, hyperlipidemia, diabetic neuropathy  OBJECTIVE:   BP (!) 93/54   Pulse 94   Ht 5' 2 (1.575 m)   Wt 111 lb (50.3 kg)   SpO2 100%   BMI 20.30 kg/m    Orthostatic VS for the past 24 hrs:  BP- Lying Pulse- Lying BP- Sitting Pulse- Sitting BP- Standing at 0 minutes Pulse- Standing at 0 minutes  10/10/24 0950 110/71 93 99/68 90 (!) 68/42 92     Physical Exam General: NAD, well appearing sitting down Neuro:  A&O Cardiovascular: RRR, no murmurs,  Respiratory: normal WOB on RA, CTAB, no wheezes, ronchi or rales Extremities: Moving all 4 extremities equally, no peripheral edema  POCT Hemoglobin 9.5 POCT CBG 83  ASSESSMENT/PLAN:   Assessment & Plan Orthostatic hypotension Lightheadedness Patient has chronic history of orthostatic hypotension causing symptomatic lightheadedness when standing leading to hospitalization in December.  Orthostatic vitals are consistent with that today.  There are no new symptoms to suggest alternative cause today. Reassuringly, point-of-care hemoglobin is stable, and CBGs within normal limits. - Start midodrine 2.5 mg 3 times daily with meals - Counseled again about about proper fluid and p.o. intake throughout the day, does not appear as if she has been compliant as directed after hospitalization - Follow-up 2 weeks with PCP - Safety precautions provided for risk of fall    Return in about 2 weeks (around 10/24/2024).  Ozell Provencal, MD, PGY-3 [facility] 10:19 AM 10/10/2024  [facility]   "

## 2024-10-10 NOTE — Assessment & Plan Note (Signed)
 Diabetes longstanding since 2007 currently uncontrolled. Patient is able to verbalize appropriate hypoglycemia management plan. Medication adherence appears good.  - Increased dose of basal insulin  Basaglar  (insulin  glargine) from 15 units to 20 units daily in the morning. - Increased dose of rapid insulin  Humalog  (insulin  lispro) from 5 units with dinner to 8 units with dinner.  - Placed Dexcom G7 sensor in office today and reconnected patient to their receiver. Patient sent home with 3 sensors today.  - Patient educated on purpose, proper use, and potential adverse effects.  - Extensively discussed pathophysiology of diabetes, recommended lifestyle interventions, dietary effects on glucose control.  - Counseled on s/sx of and management of hypoglycemia.

## 2024-10-10 NOTE — Progress Notes (Signed)
 "   S:    Chief Complaint  Shelby Brown presents with   Medication Management    Diabetes follow-up    44 y.o. female who presents for diabetes evaluation, education, and management. Shelby Brown arrives in good spirits and presents without any assistance. Shelby Brown is accompanied by her daughter, Shelby Brown, who serves as interpreter for visit.    Shelby Brown was last seen by Primary Care Provider, Dr. Romelle, on 09/09/24. At last visit, she was seen following hospitalization for hypotension.   PMH is significant for T2DM, hyperlipidemia, MDD. Shelby Brown reports Diabetes was diagnosed in 2007.   Current diabetes medications include: Basaglar  (insulin  glargine) 15 units daily, Humalog  (insulin  lispro) 5 units with dinner.  Current hyperlipidemia medications include: none (prescribed atorvastatin  20 mg but not taking)   Shelby Brown reports adherence to insulin , reports not taking any pills at home. Shelby Brown reports generalized weakness and dizziness while walking. Daughter reports Shelby Brown feels like she needs blood today.   O:  Review of Systems  Neurological:  Positive for weakness.   Physical Exam Vitals reviewed.  Pulmonary:     Effort: Pulmonary effort is normal.  Neurological:     Mental Status: She is alert.     Motor: Weakness present.  Psychiatric:        Mood and Affect: Mood normal.        Behavior: Behavior normal.        Thought Content: Thought content normal.        Judgment: Judgment normal.    Libre3 CGM - no recent data  Lab Results  Component Value Date   HGBA1C 9.5 (H) 07/25/2024   Vitals:   10/10/24 0905  BP: (!) 93/54  Pulse: 94  SpO2: 100%   Lipid Panel     Component Value Date/Time   CHOL 149 06/10/2024 1558   TRIG 132 06/10/2024 1558   HDL 64 06/10/2024 1558   CHOLHDL 2.3 06/10/2024 1558   CHOLHDL 3.3 Ratio 12/01/2008 2119   VLDL 32 12/01/2008 2119   LDLCALC 62 06/10/2024 1558   Clinical Atherosclerotic Cardiovascular Disease (ASCVD): No  The 10-year ASCVD  risk score (Arnett DK, et al., 2019) is: 0.4%   Values used to calculate the score:     Age: 73 years     Clinically relevant sex: Female     Is Non-Hispanic African American: No     Diabetic: Yes     Tobacco smoker: No     Systolic Blood Pressure: 93 mmHg     Is BP treated: No     HDL Cholesterol: 64 mg/dL     Total Cholesterol: 149 mg/dL  Lab Results  Component Value Date   CHOL 149 06/10/2024   HDL 64 06/10/2024   LDLCALC 62 06/10/2024   TRIG 132 06/10/2024   CHOLHDL 2.3 06/10/2024    Lab Results  Component Value Date   CREATININE 0.77 09/06/2024   BUN 17 09/06/2024   NA 137 09/06/2024   K 4.1 09/06/2024   CL 103 09/06/2024   CO2 27 09/06/2024    Medications Reviewed Today     Reviewed by Amalia Maude MATSU, RPH-CPP (Pharmacist) on 10/10/24 at 0911  Med List Status: <None>   Medication Order Taking? Sig Documenting Provider Last Dose Status Informant  acetaminophen  (TYLENOL ) 500 MG tablet [id number] Yes Take 2 tablets (1,000 mg total) by mouth every 6 (six) hours as needed (first line pain). Theophilus Pagan, MD  Active Self, Child  atorvastatin  (LIPITOR) 20 MG tablet [id number]  Take  1 tablet (20 mg total) by mouth daily.  Shelby Brown not taking: Reported on 10/10/2024   Toma Matas, MD  Active Self, Child  Continuous Glucose Sensor (DEXCOM G7 SENSOR) MISC [id number]  Apply one sensor to back of arm every 10 days  Shelby Brown not taking: Reported on 10/10/2024   Romelle Booty, MD  Active   dicyclomine  (BENTYL ) 10 MG capsule [id number]  Take 2 capsules (20 mg total) by mouth every 8 (eight) hours as needed for spasms (cramping).  Shelby Brown not taking: Reported on 10/10/2024   Baloch, Mahnoor, MD  Active   famotidine  (PEPCID ) 20 MG tablet [id number]  Take 1 tablet (20 mg total) by mouth daily.  Shelby Brown not taking: Reported on 10/10/2024   Theophilus Pagan, MD  Active Self, Child  ferrous sulfate  325 (65 FE) MG tablet [id number]  Take 1 tablet (325 mg total) by mouth every other day.   Shelby Brown not taking: Reported on 10/10/2024   Delores Suzann HERO, MD  Active Self, Child  fluconazole  (DIFLUCAN ) 150 MG tablet [id number]  Take 150 mg by mouth daily.  Shelby Brown not taking: Reported on 10/10/2024   [provider]  Active Self, Child  gabapentin  (NEURONTIN ) 100 MG capsule [id number] Yes Take 1 capsule (100 mg total) by mouth 3 (three) times daily. McDiarmid, Krystal BIRCH, MD  Active Self, Child  Insulin  Glargine (BASAGLAR  KWIKPEN) 100 UNIT/ML [id number] Yes Inject 15 Units into the skin daily. McDiarmid, Krystal BIRCH, MD  Active Self, Child  insulin  lispro (HUMALOG  KWIKPEN) 100 UNIT/ML KwikPen [id number] Yes Inject 4-5 Units into the skin daily with supper.  Shelby Brown taking differently: Inject 5 Units into the skin daily with supper.   McDiarmid, Krystal BIRCH, MD  Active Self, Child  Insulin  Pen Needle 31G X 8 MM MISC [id number] Yes Use one pen needle twice daily. McDiarmid, Krystal BIRCH, MD  Active Self, Child  ondansetron  (ZOFRAN -ODT) 4 MG disintegrating tablet [id number] Yes Take 1 tablet (4 mg total) by mouth every 8 (eight) hours as needed for nausea or vomiting. Lonnie Mahnoor, MD  Active Self, Child   Shelby Brown not taking:   Discontinued 11/19/20 1609 (Stop Taking at Discharge)          A/P: Diabetes longstanding since 2007 currently uncontrolled. Shelby Brown is able to verbalize appropriate hypoglycemia management plan. Medication adherence appears good.  - Increased dose of basal insulin  Basaglar  (insulin  glargine) from 15 units to 20 units daily in the morning. - Increased dose of rapid insulin  Humalog  (insulin  lispro) from 5 units with dinner to 8 units with dinner.  - Placed Dexcom G7 sensor in office today and reconnected Shelby Brown to their receiver. Shelby Brown sent home with 3 sensors today.  - Shelby Brown educated on purpose, proper use, and potential adverse effects.  - Extensively discussed pathophysiology of diabetes, recommended lifestyle interventions, dietary effects on glucose control.  -  Counseled on s/sx of and management of hypoglycemia.   ASCVD risk - Primary prevention in Shelby Brown with diabetes. Last LDL 62 mg/dl is at goal of <29 mg/dL. Moderate intensity statin indicated.  - Continued atorvastatin  20 mg daily.   Due to Shelby Brown's symptoms, she was referred and seen by Dr. Alba today.   Written Shelby Brown instructions provided. Shelby Brown verbalized understanding of treatment plan.  Total time in face to face counseling 34 minutes.    Follow-up:  Pharmacist visit 11/13/2024 PCP clinic visit today Shelby Brown seen with Sabra Schuller, PharmD Candidate - PY2 student and Megan McGill, PharmD Candidate - PY4 student.   "

## 2024-10-10 NOTE — Assessment & Plan Note (Signed)
 Patient has chronic history of orthostatic hypotension causing symptomatic lightheadedness when standing leading to hospitalization in December.  Orthostatic vitals are consistent with that today.  There are no new symptoms to suggest alternative cause today. Reassuringly, point-of-care hemoglobin is stable, and CBGs within normal limits. - Start midodrine 2.5 mg 3 times daily with meals - Counseled again about about proper fluid and p.o. intake throughout the day, does not appear as if she has been compliant as directed after hospitalization - Follow-up 2 weeks with PCP - Safety precautions provided for risk of fall

## 2024-10-14 NOTE — Progress Notes (Signed)
 Reviewed and agree with Dr Rennis plan.

## 2024-11-13 ENCOUNTER — Ambulatory Visit: Payer: Self-pay | Admitting: Pharmacist
